# Patient Record
Sex: Female | Born: 1960 | Race: White | Hispanic: No | Marital: Married | Smoking: Never smoker
Health system: Southern US, Community
[De-identification: ages and names within clinical notes are randomized; demographics above are authoritative.]

## PROBLEM LIST (undated history)

## (undated) ENCOUNTER — Inpatient Hospital Stay: Admission: EM | Payer: Self-pay | Source: Home / Self Care

## (undated) DIAGNOSIS — M545 Low back pain: Secondary | ICD-10-CM

## (undated) DIAGNOSIS — D219 Benign neoplasm of connective and other soft tissue, unspecified: Secondary | ICD-10-CM

## (undated) DIAGNOSIS — G8929 Other chronic pain: Secondary | ICD-10-CM

## (undated) DIAGNOSIS — I10 Essential (primary) hypertension: Secondary | ICD-10-CM

## (undated) DIAGNOSIS — F419 Anxiety disorder, unspecified: Secondary | ICD-10-CM

## (undated) DIAGNOSIS — N390 Urinary tract infection, site not specified: Secondary | ICD-10-CM

## (undated) DIAGNOSIS — Z923 Personal history of irradiation: Secondary | ICD-10-CM

## (undated) DIAGNOSIS — T7840XA Allergy, unspecified, initial encounter: Secondary | ICD-10-CM

## (undated) DIAGNOSIS — M1990 Unspecified osteoarthritis, unspecified site: Secondary | ICD-10-CM

## (undated) DIAGNOSIS — N939 Abnormal uterine and vaginal bleeding, unspecified: Principal | ICD-10-CM

## (undated) HISTORY — DX: Urinary tract infection, site not specified: N39.0

## (undated) HISTORY — DX: Low back pain: M54.5

## (undated) HISTORY — PX: KNEE SURGERY: SHX244

## (undated) HISTORY — DX: Allergy, unspecified, initial encounter: T78.40XA

## (undated) HISTORY — DX: Other chronic pain: G89.29

## (undated) HISTORY — DX: Benign neoplasm of connective and other soft tissue, unspecified: D21.9

## (undated) HISTORY — DX: Abnormal uterine and vaginal bleeding, unspecified: N93.9

## (undated) HISTORY — DX: Anxiety disorder, unspecified: F41.9

## (undated) HISTORY — DX: Essential (primary) hypertension: I10

## (undated) HISTORY — PX: WISDOM TOOTH EXTRACTION: SHX21

---

## 1998-10-17 ENCOUNTER — Other Ambulatory Visit: Admission: RE | Admit: 1998-10-17 | Discharge: 1998-10-17 | Payer: Self-pay | Admitting: Family Medicine

## 2001-04-22 ENCOUNTER — Encounter: Payer: Self-pay | Admitting: Family Medicine

## 2001-04-22 ENCOUNTER — Ambulatory Visit (HOSPITAL_COMMUNITY): Admission: RE | Admit: 2001-04-22 | Discharge: 2001-04-22 | Payer: Self-pay | Admitting: Family Medicine

## 2002-04-23 ENCOUNTER — Ambulatory Visit (HOSPITAL_COMMUNITY): Admission: RE | Admit: 2002-04-23 | Discharge: 2002-04-23 | Payer: Self-pay | Admitting: Family Medicine

## 2002-04-23 ENCOUNTER — Encounter: Payer: Self-pay | Admitting: Family Medicine

## 2003-05-18 ENCOUNTER — Ambulatory Visit (HOSPITAL_COMMUNITY): Admission: RE | Admit: 2003-05-18 | Discharge: 2003-05-18 | Payer: Self-pay | Admitting: Family Medicine

## 2004-07-31 ENCOUNTER — Ambulatory Visit (HOSPITAL_COMMUNITY): Admission: RE | Admit: 2004-07-31 | Discharge: 2004-07-31 | Payer: Self-pay | Admitting: Nurse Practitioner

## 2005-08-06 ENCOUNTER — Ambulatory Visit (HOSPITAL_COMMUNITY): Admission: RE | Admit: 2005-08-06 | Discharge: 2005-08-06 | Payer: Self-pay | Admitting: Family Medicine

## 2006-08-07 ENCOUNTER — Ambulatory Visit (HOSPITAL_COMMUNITY): Admission: RE | Admit: 2006-08-07 | Discharge: 2006-08-07 | Payer: Self-pay | Admitting: Family Medicine

## 2007-09-04 ENCOUNTER — Ambulatory Visit (HOSPITAL_COMMUNITY): Admission: RE | Admit: 2007-09-04 | Discharge: 2007-09-04 | Payer: Self-pay | Admitting: Family Medicine

## 2008-09-05 ENCOUNTER — Ambulatory Visit (HOSPITAL_COMMUNITY): Admission: RE | Admit: 2008-09-05 | Discharge: 2008-09-05 | Payer: Self-pay | Admitting: Physician Assistant

## 2009-09-27 ENCOUNTER — Ambulatory Visit (HOSPITAL_COMMUNITY): Admission: RE | Admit: 2009-09-27 | Discharge: 2009-09-27 | Payer: Self-pay | Admitting: Physician Assistant

## 2010-07-29 ENCOUNTER — Encounter: Payer: Self-pay | Admitting: Family Medicine

## 2010-10-15 ENCOUNTER — Other Ambulatory Visit: Payer: Self-pay | Admitting: Physician Assistant

## 2010-10-15 DIAGNOSIS — Z1231 Encounter for screening mammogram for malignant neoplasm of breast: Secondary | ICD-10-CM

## 2010-10-17 ENCOUNTER — Ambulatory Visit (HOSPITAL_COMMUNITY)
Admission: RE | Admit: 2010-10-17 | Discharge: 2010-10-17 | Disposition: A | Discharge status: Home / Self Care | Payer: 59 | Source: Ambulatory Visit | Attending: Family Medicine | Admitting: Family Medicine

## 2010-10-17 DIAGNOSIS — Z1231 Encounter for screening mammogram for malignant neoplasm of breast: Secondary | ICD-10-CM | POA: Insufficient documentation

## 2010-10-17 LAB — HM MAMMOGRAPHY: HM Mammogram: NORMAL

## 2011-01-11 ENCOUNTER — Encounter: Payer: Self-pay | Admitting: Family Medicine

## 2011-01-11 DIAGNOSIS — I10 Essential (primary) hypertension: Secondary | ICD-10-CM | POA: Insufficient documentation

## 2011-01-11 DIAGNOSIS — N390 Urinary tract infection, site not specified: Secondary | ICD-10-CM | POA: Insufficient documentation

## 2011-01-11 DIAGNOSIS — F419 Anxiety disorder, unspecified: Secondary | ICD-10-CM | POA: Insufficient documentation

## 2011-11-19 ENCOUNTER — Other Ambulatory Visit: Payer: Self-pay | Admitting: Physician Assistant

## 2011-11-19 DIAGNOSIS — Z1231 Encounter for screening mammogram for malignant neoplasm of breast: Secondary | ICD-10-CM

## 2011-12-16 ENCOUNTER — Ambulatory Visit (HOSPITAL_COMMUNITY): Payer: 59 | Attending: Physician Assistant

## 2012-01-18 ENCOUNTER — Encounter (HOSPITAL_COMMUNITY): Payer: Self-pay | Admitting: *Deleted

## 2012-01-18 ENCOUNTER — Emergency Department (HOSPITAL_COMMUNITY)
Admission: EM | Admit: 2012-01-18 | Discharge: 2012-01-18 | Disposition: A | Discharge status: Home / Self Care | Payer: BC Managed Care – PPO | Attending: Emergency Medicine | Admitting: Emergency Medicine

## 2012-01-18 DIAGNOSIS — Z7982 Long term (current) use of aspirin: Secondary | ICD-10-CM | POA: Insufficient documentation

## 2012-01-18 DIAGNOSIS — M62838 Other muscle spasm: Secondary | ICD-10-CM | POA: Insufficient documentation

## 2012-01-18 DIAGNOSIS — Z79899 Other long term (current) drug therapy: Secondary | ICD-10-CM | POA: Insufficient documentation

## 2012-01-18 DIAGNOSIS — I10 Essential (primary) hypertension: Secondary | ICD-10-CM | POA: Insufficient documentation

## 2012-01-18 DIAGNOSIS — R42 Dizziness and giddiness: Secondary | ICD-10-CM | POA: Insufficient documentation

## 2012-01-18 LAB — CBC WITH DIFFERENTIAL/PLATELET
Basophils Relative: 0 % (ref 0–1)
Eosinophils Relative: 0 % (ref 0–5)

## 2012-01-18 LAB — COMPREHENSIVE METABOLIC PANEL
ALT: 21 U/L (ref 0–35)
Albumin: 4.6 g/dL (ref 3.5–5.2)
Alkaline Phosphatase: 36 U/L — ABNORMAL LOW (ref 39–117)
BUN: 9 mg/dL (ref 6–23)
Calcium: 10 mg/dL (ref 8.4–10.5)
Chloride: 92 mEq/L — ABNORMAL LOW (ref 96–112)
Creatinine, Ser: 0.71 mg/dL (ref 0.50–1.10)
GFR calc Af Amer: >90 mL/min (ref >90)
Glucose, Bld: 122 mg/dL — ABNORMAL HIGH (ref 70–99)
Total Bilirubin: 0.3 mg/dL (ref 0.3–1.2)

## 2012-01-18 MED ORDER — LORAZEPAM 1 MG PO TABS: ORAL_TABLET | ORAL | Status: DC

## 2012-01-18 NOTE — ED Notes (Signed)
Pt reports having "jerking motions" starting last night that were brief, but frequent.  Pt reports she did not take her blood pressure medications or citalopram- pt takes for anxiety.  Pt reports she forgot to take them last night.  Pt reports feeling dizzy, but no other issues.

## 2012-01-18 NOTE — ED Provider Notes (Signed)
History     CSN: [id number]  Arrival date & time 01/18/12  4098   First MD Initiated Contact with Patient 01/18/12 0913      Chief Complaint  Patient presents with  . Dizziness  . Hypertension    (Consider location/radiation/quality/duration/timing/severity/associated sxs/prior treatment) Patient is a 51 y.o. female presenting with hypertension. The history is provided by the patient (pt complains of muscle spasms yesterday after she took her husbans medicine). No language interpreter was used.  Hypertension This is a new problem. The current episode started 6 to 12 hours ago. The problem occurs daily. The problem has not changed since onset.Pertinent negatives include no chest pain, no abdominal pain and no headaches. Nothing aggravates the symptoms. Nothing relieves the symptoms. She has tried nothing for the symptoms. The treatment provided moderate relief.    Past Medical History  Diagnosis Date  . Hypertension   . Anxiety   . Frequent UTI     Past Surgical History  Procedure Date  . Knee surgery     left  . Wisdom tooth extraction     No family history on file.  History  Substance Use Topics  . Smoking status: Former Games developer  . Smokeless tobacco: Not on file  . Alcohol Use: 3.6 oz/week    6 Cans of beer per week    OB History    Grav Para Term Preterm Abortions TAB SAB Ect Mult Living                  Review of Systems  Constitutional: Negative for fatigue.  HENT: Negative for congestion, sinus pressure and ear discharge.   Eyes: Negative for discharge.  Respiratory: Negative for cough.   Cardiovascular: Negative for chest pain.  Gastrointestinal: Negative for abdominal pain and diarrhea.  Genitourinary: Negative for frequency and hematuria.  Musculoskeletal: Negative for back pain.  Skin: Negative for rash.  Neurological: Negative for seizures and headaches.  Hematological: Negative.   Psychiatric/Behavioral: Negative for hallucinations.     Allergies  Norvasc  Home Medications   Current Outpatient Rx  Name Route Sig Dispense Refill  . ASPIRIN 81 MG PO TABS Oral Take 81 mg by mouth daily.      . ATENOLOL 25 MG PO TABS Oral Take 25 mg by mouth daily.      Marland Kitchen CALCIUM CARBONATE-VITAMIN D 250-125 MG-UNIT PO TABS Oral Take 1 tablet by mouth daily.      Marland Kitchen CETIRIZINE HCL 10 MG PO TABS Oral Take 10 mg by mouth daily.    Marland Kitchen CITALOPRAM HYDROBROMIDE 40 MG PO TABS Oral Take 40 mg by mouth daily.      Marland Kitchen CLONAZEPAM 0.5 MG PO TABS Oral Take 0.5 mg by mouth 2 (two) times daily as needed.      . OMEGA-3 FATTY ACIDS 1000 MG PO CAPS Oral Take 2 g by mouth 2 (two) times daily.      Marland Kitchen HYDROCHLOROTHIAZIDE 25 MG PO TABS Oral Take 25 mg by mouth daily.      Marland Kitchen LOSARTAN POTASSIUM 50 MG PO TABS Oral Take 50 mg by mouth daily.      . LOW-OGESTREL PO Oral Take by mouth.      . TRAMADOL HCL 50 MG PO TABS Oral Take 50 mg by mouth every 6 (six) hours as needed. For pain.    Marland Kitchen LORAZEPAM 1 MG PO TABS  Take one pill 3 times a day for muscle spasm 15 tablet 0    BP 181/79  Pulse 75  Temp 98.5 F (36.9 C) (Oral)  Resp 16  SpO2 100%  Physical Exam  Constitutional: She is oriented to person, place, and time. She appears well-developed.  HENT:  Head: Normocephalic and atraumatic.  Eyes: Conjunctivae and EOM are normal. No scleral icterus.  Neck: Neck supple. No thyromegaly present.  Cardiovascular: Normal rate and regular rhythm.  Exam reveals no gallop and no friction rub.   No murmur heard. Pulmonary/Chest: No stridor. She has no wheezes. She has no rales. She exhibits no tenderness.  Abdominal: She exhibits no distension. There is no tenderness. There is no rebound.  Musculoskeletal: Normal range of motion. She exhibits no edema.  Lymphadenopathy:    She has no cervical adenopathy.  Neurological: She is oriented to person, place, and time. Coordination normal.  Skin: No rash noted. No erythema.  Psychiatric: She has a normal mood and affect.  Her behavior is normal.    ED Course  Procedures (including critical care time)  Labs Reviewed  CBC WITH DIFFERENTIAL - Abnormal; Notable for the following:    MCH 34.2 (*)     All other components within normal limits  COMPREHENSIVE METABOLIC PANEL - Abnormal; Notable for the following:    Sodium 129 (*)     Chloride 92 (*)     Glucose, Bld 122 (*)     Alkaline Phosphatase 36 (*)     All other components within normal limits   No results found.   1. Muscle spasms of lower extremity       MDM          Benny Lennert, MD 01/18/12 1036

## 2012-01-18 NOTE — ED Notes (Signed)
Pt states she took her BP medication and citalopram this AM around 6.

## 2012-01-18 NOTE — ED Notes (Addendum)
Pt resting at present ?

## 2012-02-13 ENCOUNTER — Ambulatory Visit (HOSPITAL_COMMUNITY)
Admission: RE | Admit: 2012-02-13 | Discharge: 2012-02-13 | Disposition: A | Discharge status: Home / Self Care | Payer: BC Managed Care – PPO | Source: Ambulatory Visit | Attending: Physician Assistant | Admitting: Physician Assistant

## 2012-02-13 DIAGNOSIS — Z1231 Encounter for screening mammogram for malignant neoplasm of breast: Secondary | ICD-10-CM

## 2012-07-08 HISTORY — PX: KNEE SURGERY: SHX244

## 2012-09-24 ENCOUNTER — Telehealth: Payer: Self-pay | Admitting: Physician Assistant

## 2012-09-24 NOTE — Telephone Encounter (Signed)
Need approval for controlled medication. 

## 2012-09-24 NOTE — Telephone Encounter (Signed)
Approved. 90/ one refill.

## 2012-11-17 ENCOUNTER — Telehealth: Payer: Self-pay | Admitting: Physician Assistant

## 2012-11-17 NOTE — Telephone Encounter (Signed)
Alprazolam 0.5mg  take one tablet by mouth TID as needed last refill 10/17/12

## 2012-11-17 NOTE — Telephone Encounter (Signed)
Refilled 3/20/ #90 + 1 add refill.  Not due until 5/20.  Refilled denied at this time.

## 2012-11-23 ENCOUNTER — Telehealth: Payer: Self-pay | Admitting: Physician Assistant

## 2012-11-23 MED ORDER — ALPRAZOLAM 0.5 MG PO TABS: 0.5000 mg | ORAL_TABLET | Freq: Three times a day (TID) | ORAL | Status: DC | PRN

## 2012-11-23 NOTE — Telephone Encounter (Signed)
Refill appropriate.  #90 + 0 called to pharmacy

## 2012-11-23 NOTE — Telephone Encounter (Signed)
Approved.  

## 2012-12-02 ENCOUNTER — Telehealth: Payer: Self-pay | Admitting: Family Medicine

## 2012-12-02 MED ORDER — ATENOLOL 25 MG PO TABS: 25.0000 mg | ORAL_TABLET | Freq: Every day | ORAL | Status: DC

## 2012-12-02 NOTE — Telephone Encounter (Signed)
Rx Refilled  

## 2012-12-23 ENCOUNTER — Telehealth: Payer: Self-pay | Admitting: Family Medicine

## 2012-12-23 NOTE — Telephone Encounter (Signed)
Ok to refill 

## 2012-12-23 NOTE — Telephone Encounter (Signed)
?   OK to Refill  

## 2012-12-24 MED ORDER — ALPRAZOLAM 0.5 MG PO TABS: 0.5000 mg | ORAL_TABLET | Freq: Three times a day (TID) | ORAL | Status: DC | PRN

## 2012-12-24 NOTE — Telephone Encounter (Signed)
Rx Refilled  

## 2013-01-11 ENCOUNTER — Other Ambulatory Visit: Payer: Self-pay | Admitting: Family Medicine

## 2013-01-18 ENCOUNTER — Other Ambulatory Visit: Payer: Self-pay | Admitting: Physician Assistant

## 2013-01-21 ENCOUNTER — Other Ambulatory Visit: Payer: Self-pay | Admitting: Family Medicine

## 2013-01-21 ENCOUNTER — Other Ambulatory Visit: Payer: Self-pay | Admitting: Physician Assistant

## 2013-01-22 ENCOUNTER — Other Ambulatory Visit: Payer: Self-pay | Admitting: Family Medicine

## 2013-01-22 ENCOUNTER — Telehealth: Payer: Self-pay | Admitting: Family Medicine

## 2013-01-22 ENCOUNTER — Encounter: Payer: Self-pay | Admitting: Family Medicine

## 2013-01-22 MED ORDER — ALPRAZOLAM 0.5 MG PO TABS: ORAL_TABLET | ORAL | Status: DC

## 2013-01-22 NOTE — Telephone Encounter (Signed)
Rx Refilled  

## 2013-01-22 NOTE — Telephone Encounter (Signed)
Pt made appt for 02/01/13.  One month refill called.

## 2013-01-22 NOTE — Telephone Encounter (Signed)
Refill denied.  Based on last office visit is suppose to cutting back on use and continues with same TID use.  Is due for office visit.  Letter sent to make appt and refill denied.

## 2013-01-22 NOTE — Telephone Encounter (Signed)
?   OK to Refill  

## 2013-01-22 NOTE — Telephone Encounter (Signed)
Ok to refill 

## 2013-01-22 NOTE — Telephone Encounter (Signed)
Just refilled 01/18/13 #30 + 1  Refill denied.  Pt NTBS  letter sent to make appt.

## 2013-02-01 ENCOUNTER — Encounter: Payer: Self-pay | Admitting: Physician Assistant

## 2013-02-01 ENCOUNTER — Ambulatory Visit (INDEPENDENT_AMBULATORY_CARE_PROVIDER_SITE_OTHER): Payer: BC Managed Care – PPO | Admitting: Physician Assistant

## 2013-02-01 VITALS — BP 114/66 | HR 64 | Temp 98.2°F | Resp 18 | Ht 63.5 in | Wt 157.0 lb

## 2013-02-01 DIAGNOSIS — I10 Essential (primary) hypertension: Secondary | ICD-10-CM

## 2013-02-01 DIAGNOSIS — M5432 Sciatica, left side: Secondary | ICD-10-CM

## 2013-02-01 DIAGNOSIS — M5430 Sciatica, unspecified side: Secondary | ICD-10-CM

## 2013-02-01 DIAGNOSIS — F411 Generalized anxiety disorder: Secondary | ICD-10-CM

## 2013-02-01 DIAGNOSIS — F419 Anxiety disorder, unspecified: Secondary | ICD-10-CM

## 2013-02-01 LAB — COMPLETE METABOLIC PANEL WITH GFR
ALT: 22 U/L (ref 0–35)
AST: 16 U/L (ref 0–37)
Albumin: 4.5 g/dL (ref 3.5–5.2)
BUN: 26 mg/dL — ABNORMAL HIGH (ref 6–23)
Chloride: 102 mEq/L (ref 96–112)
GFR, Est African American: 63 mL/min
Glucose, Bld: 95 mg/dL (ref 70–99)
Potassium: 4.5 mEq/L (ref 3.5–5.3)
Sodium: 136 mEq/L (ref 135–145)
Total Protein: 6.8 g/dL (ref 6.0–8.3)

## 2013-02-01 MED ORDER — TRAMADOL HCL 50 MG PO TABS: 50.0000 mg | ORAL_TABLET | Freq: Four times a day (QID) | ORAL | Status: DC | PRN

## 2013-02-01 NOTE — Progress Notes (Signed)
Patient ID: Erika Harvey MRN: [id number], DOB: Jun 08, 1961, 52 y.o. Date of Encounter: @DATE @  Chief Complaint:  Chief Complaint  Patient presents with  . 6 mth check up med refills    also c/o low back pain s/p knee surgery    HPI: 52 y.o. year old white female  presents for routine f/u OV.   Also, has c/o pain that starts in left thigh and goes down lateral aspect of left leg. She had left knee surgery in January. After that is when this pain started. Ortho treated her with prednisone. It got better but then returned. Now it is constant. Pain all the time. Very uncomfortable at work. She is going to schedule f/u ov with ortho but requests that I refill tramadol once for her to use shile she waits to get in with ortho.   At OV 11/13 she wanted to try to decrease Celexa dose. Says "that only lasted 3 days"-Felt terrible-went back to 40 mg. Mood is stable with this and the xanax.   Taking BP meds as directed. No adv effects.   Past Medical History  Diagnosis Date  . Hypertension   . Anxiety   . Frequent UTI      Home Meds: See attached medication section for current medication list. Any medications entered into computer today will not appear on this note's list. The medications listed below were entered prior to today. Current Outpatient Prescriptions on File Prior to Visit  Medication Sig Dispense Refill  . ALPRAZolam (XANAX) 0.5 MG tablet TAKE 1 TABLET BY MOUTH 3 TIMES A DAY AS NEEDED  90 tablet  0  . aspirin 81 MG tablet Take 81 mg by mouth daily.        Marland Kitchen atenolol (TENORMIN) 25 MG tablet Take 1 tablet (25 mg total) by mouth daily.  30 tablet  5  . calcium-vitamin D (OSCAL WITH D) 250-125 MG-UNIT per tablet Take 1 tablet by mouth daily.        . cetirizine (ZYRTEC) 10 MG tablet Take 10 mg by mouth daily.      . citalopram (CELEXA) 40 MG tablet TAKE 1 TABLET DAILY  30 tablet  1  . fish oil-omega-3 fatty acids 1000 MG capsule Take 2 g by mouth 2 (two) times daily.        .  hydrochlorothiazide 25 MG tablet Take 25 mg by mouth daily.        Marland Kitchen losartan (COZAAR) 50 MG tablet TAKE 1 TABLET BY MOUTH EVERY DAY  30 tablet  4  . Norgestrel-Ethinyl Estradiol (LOW-OGESTREL PO) Take by mouth.        . clonazePAM (KLONOPIN) 0.5 MG tablet Take 0.5 mg by mouth 2 (two) times daily as needed.        Marland Kitchen LORazepam (ATIVAN) 1 MG tablet Take one pill 3 times a day for muscle spasm  15 tablet  0   No current facility-administered medications on file prior to visit.    Allergies:  Allergies  Allergen Reactions  . Norvasc (Amlodipine Besylate) Swelling and Rash    History   Social History  . Marital Status: Married    Spouse Name: N/A    Number of Children: N/A  . Years of Education: N/A   Occupational History  . Not on file.   Social History Main Topics  . Smoking status: Former Games developer  . Smokeless tobacco: Not on file  . Alcohol Use: 3.6 oz/week    6 Cans of beer per  week  . Drug Use: No  . Sexually Active:    Other Topics Concern  . Not on file   Social History Narrative  . No narrative on file    No family history on file.   Review of Systems:  See HPI for pertinent ROS. All other ROS negative.    Physical Exam: Blood pressure 114/66, pulse 64, temperature 98.2 F (36.8 C), temperature source Oral, resp. rate 18, height 5' 3.5" (1.613 m), weight 157 lb (71.215 kg)., Body mass index is 27.37 kg/(m^2). General: WNWD WF. Appears in no acute distress. Neck: Supple. No thyromegaly. No lymphadenopathy. No carotid bruits. Lungs: Clear bilaterally to auscultation without wheezes, rales, or rhonchi. Breathing is unlabored. Heart: RRR with S1 S2. No murmurs, rubs, or gallops. Abdomen: Soft, non-tender, non-distended with normoactive bowel sounds. No hepatomegaly. No rebound/guarding. No obvious abdominal masses. Musculoskeletal:  Strength and tone normal for age. Extremities/Skin: Warm and dry. No clubbing or cyanosis. No edema. No rashes or suspicious  lesions. Neuro: Alert and oriented X 3. Moves all extremities spontaneously. Gait is normal. CNII-XII grossly in tact. Psych:  Responds to questions appropriately with a normal affect.     ASSESSMENT AND PLAN:  52 y.o. year old female with  52 y.o. year old female with current dose of Celexa and Xanax. See prior notes regaarding the fact that she uses xanax throughtout her day "in order to calm down enough to focus" - COMPLETE METABOLIC PANEL WITH GFR  2. Hypertension At goal. Cont current meds.  - COMPLETE METABOLIC PANEL WITH GFR  3. Sciatica neuralgia, left F/U with ortho. - traMADol (ULTRAM) 50 MG tablet; Take 1 tablet (50 mg total) by mouth every 6 (six) hours as needed. For pain.  Dispense: 240 tablet; Refill: 1 - COMPLETE METABOLIC PANEL WITH GFR  4. Weight Gain: Secondary to knee surgery and sciatica. Wt now 157.          05/2012: 151         02/2012: 136. She says she was doing treadmill 5 days/week for one mile prior to above.  Will monitor at f/u. She will improve diet. Will restart exercise after f/u with ortho and once pain resolves.   For preventive care see CPE 05/25/2012  Signed, [facility] [place], [place], [facility] 02/01/2013 8:14 PM

## 2013-02-02 ENCOUNTER — Encounter: Payer: Self-pay | Admitting: Family Medicine

## 2013-02-21 ENCOUNTER — Other Ambulatory Visit: Payer: Self-pay | Admitting: Family Medicine

## 2013-02-22 NOTE — Telephone Encounter (Signed)
ok 

## 2013-02-22 NOTE — Telephone Encounter (Signed)
?   OK to Refill  

## 2013-03-23 ENCOUNTER — Other Ambulatory Visit: Payer: Self-pay | Admitting: Family Medicine

## 2013-03-24 ENCOUNTER — Other Ambulatory Visit: Payer: Self-pay | Admitting: Physician Assistant

## 2013-03-24 NOTE — Telephone Encounter (Signed)
Ok to refill 

## 2013-03-25 NOTE — Telephone Encounter (Signed)
ok 

## 2013-03-25 NOTE — Telephone Encounter (Signed)
Meds refilled.

## 2013-03-27 ENCOUNTER — Other Ambulatory Visit: Payer: Self-pay | Admitting: Physician Assistant

## 2013-03-29 NOTE — Telephone Encounter (Signed)
I reviewed last office note. She was going to followup with orthopedic regarding her sciatica. She wanted to use tramadol while she waited for that appointment. Tell her we will he needs to decrease the amount. I will give her 180 per month. Followup with Ortho as planned. May dispense 180 with 0 refill.

## 2013-03-29 NOTE — Telephone Encounter (Signed)
#  180 called in. Left patient mess to call back to ask about ortho appt and tell about quantity adjustment to pain med

## 2013-03-29 NOTE — Telephone Encounter (Signed)
Ok to refill 

## 2013-04-05 ENCOUNTER — Other Ambulatory Visit: Payer: Self-pay | Admitting: Physician Assistant

## 2013-04-05 NOTE — Telephone Encounter (Signed)
OK refill?? 

## 2013-04-06 NOTE — Telephone Encounter (Signed)
May refill with prn refills

## 2013-04-06 NOTE — Telephone Encounter (Signed)
rx sent

## 2013-04-12 ENCOUNTER — Other Ambulatory Visit: Payer: Self-pay | Admitting: Physician Assistant

## 2013-04-12 NOTE — Telephone Encounter (Signed)
This was just refill 9/29

## 2013-04-22 ENCOUNTER — Other Ambulatory Visit: Payer: Self-pay | Admitting: Family Medicine

## 2013-04-22 NOTE — Telephone Encounter (Signed)
Last OV 7/28  Last RF 9/16 #90  OK refill?

## 2013-04-22 NOTE — Telephone Encounter (Signed)
Approved for #90+ one additional refill. 

## 2013-04-23 ENCOUNTER — Encounter: Payer: Self-pay | Admitting: Family Medicine

## 2013-04-23 ENCOUNTER — Ambulatory Visit (INDEPENDENT_AMBULATORY_CARE_PROVIDER_SITE_OTHER): Payer: BC Managed Care – PPO | Admitting: Family Medicine

## 2013-04-23 VITALS — BP 160/96 | HR 76 | Temp 98.2°F | Resp 18 | Wt 150.0 lb

## 2013-04-23 DIAGNOSIS — R112 Nausea with vomiting, unspecified: Secondary | ICD-10-CM

## 2013-04-23 LAB — COMPLETE METABOLIC PANEL WITH GFR
ALT: 31 U/L (ref 0–35)
AST: 23 U/L (ref 0–37)
Alkaline Phosphatase: 37 U/L — ABNORMAL LOW (ref 39–117)
Creat: 0.83 mg/dL (ref 0.50–1.10)
GFR, Est African American: >89 mL/min
Total Bilirubin: 0.4 mg/dL (ref 0.3–1.2)

## 2013-04-23 LAB — CBC WITH DIFFERENTIAL/PLATELET
Basophils Absolute: 0 10*3/uL (ref 0.0–0.1)
Basophils Relative: 0 % (ref 0–1)
Eosinophils Absolute: 0 10*3/uL (ref 0.0–0.7)
Eosinophils Relative: 0 % (ref 0–5)
MCH: 33.7 pg (ref 26.0–34.0)
MCV: 95 fL (ref 78.0–100.0)
Platelets: 460 10*3/uL — ABNORMAL HIGH (ref 150–400)
RDW: 12.3 % (ref 11.5–15.5)
WBC: 11.3 10*3/uL — ABNORMAL HIGH (ref 4.0–10.5)

## 2013-04-23 NOTE — Progress Notes (Signed)
Subjective:    Patient ID: Erika Harvey, female    DOB: 07-11-1960, 52 y.o.   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 Since beginning the prednisone she has had intractable nausea. She has not vomited. However she feels extremely nauseated soon as she eats. She also had some mild diarrhea early in the week but has not been every day. She denies any fevers or chills. She denies any sinus pressure or pain. She denies any otalgia. She denies any cough shortness of breath or chest pain. She denies any vaginal discharge. She denies any vaginal bleeding. She denies any dysuria or hematuria. She states is no chance she could be pregnant. Past Medical History  Diagnosis Date  . Hypertension   . Anxiety   . Frequent UTI    Current Outpatient Prescriptions on File Prior to Visit  Medication Sig Dispense Refill  . ALPRAZolam (XANAX) 0.5 MG tablet TAKE 1 TABLET BY MOUTH 3 TIMES DAILY AS NEEDED  90 tablet  0  . aspirin 81 MG tablet Take 81 mg by mouth daily.        Marland Kitchen atenolol (TENORMIN) 25 MG tablet Take 1 tablet (25 mg total) by mouth daily.  30 tablet  5  . calcium-vitamin D (OSCAL WITH D) 250-125 MG-UNIT per tablet Take 1 tablet by mouth daily.        . cetirizine (ZYRTEC) 10 MG tablet Take 10 mg by mouth daily.      . citalopram (CELEXA) 40 MG tablet TAKE 1 TABLET EVERY DAY  30 tablet  1  . clonazePAM (KLONOPIN) 0.5 MG tablet Take 0.5 mg by mouth 2 (two) times daily as needed.        . Harvey oil-omega-3 fatty acids 1000 MG capsule Take 2 g by mouth 2 (two) times daily.        . hydrochlorothiazide 25 MG tablet Take 25 mg by mouth daily.        Marland Kitchen losartan (COZAAR) 50 MG tablet TAKE 1 TABLET BY MOUTH EVERY DAY  30 tablet  4  . Norgestrel-Ethinyl Estradiol (LOW-OGESTREL PO) Take by mouth.        . sulfamethoxazole-trimethoprim (BACTRIM DS) 800-160 MG per tablet TAKE 1 TABLET BY MOUTH AFTER INTERCOURSE AS NEEDED  30 tablet  prn   . traMADol (ULTRAM) 50 MG tablet TAKE 1 TABLET BY MOUTH EVERY 6 HOURS AS NEEDED FOR PAIN  180 tablet  0  . LORazepam (ATIVAN) 1 MG tablet Take one pill 3 times a day for muscle spasm  15 tablet  0  . meloxicam (MOBIC) 15 MG tablet Take 1 tablet by mouth daily.       No current facility-administered medications on file prior to visit.   Allergies  Allergen Reactions  . Norvasc [Amlodipine Besylate] Swelling and Rash   History   Social History  . Marital Status: Married    Spouse Name: N/A    Number of Children: N/A  . Years of Education: N/A   Occupational History  . Not on file.   Social History Main Topics  . Smoking status: Former Games developer  . Smokeless tobacco: Not on file  . Alcohol Use: 3.6 oz/week    6 Cans of beer per week  . Drug Use: No  . Sexual Activity:    Other Topics Concern  . Not on file   Social History Narrative  . No narrative on file  Review of Systems  All other systems reviewed and are negative.       Objective:   Physical Exam  Vitals reviewed. Constitutional: She appears well-developed and well-nourished. No distress.  HENT:  Head: Normocephalic and atraumatic.  Right Ear: External ear normal.  Left Ear: External ear normal.  Nose: Nose normal.  Mouth/Throat: Oropharynx is clear and moist. No oropharyngeal exudate.  Eyes: Conjunctivae are normal. Pupils are equal, round, and reactive to light. Right eye exhibits no discharge. Left eye exhibits no discharge. No scleral icterus.  Neck: Normal range of motion. Neck supple. No JVD present. No thyromegaly present.  Cardiovascular: Normal rate, regular rhythm, normal heart sounds and intact distal pulses.   No murmur heard. Pulmonary/Chest: Effort normal and breath sounds normal. No respiratory distress. She has no wheezes. She has no rales. She exhibits no tenderness.  Abdominal: Soft. Bowel sounds are normal. She exhibits no distension. There is no tenderness. There is no rebound and  no guarding.  Lymphadenopathy:    She has no cervical adenopathy.  Skin: She is not diaphoretic.          Assessment & Plan:  1. Nausea with vomiting I suspect this is nausea due to the prednisone. I recommended she discontinue prednisone. Begin Zofran 4 mg Q6 hours as needed for nausea. Recommended a bland diet and push fluids. Anticipate self limited resolution of the weekend. If symptoms worsen she is to seek a doctor immediately. I will check a CBC a CMP and lipase to be thorough. I anticipate them to be normal. I anticipate the patient's blood pressure improved on the prednisone and is usually well controlled. - CBC with Differential - COMPLETE METABOLIC PANEL WITH GFR - Lipase

## 2013-04-23 NOTE — Telephone Encounter (Signed)
rx called in

## 2013-05-13 ENCOUNTER — Other Ambulatory Visit: Payer: Self-pay

## 2013-05-15 ENCOUNTER — Other Ambulatory Visit: Payer: Self-pay | Admitting: Physician Assistant

## 2013-05-15 ENCOUNTER — Other Ambulatory Visit: Payer: Self-pay | Admitting: Family Medicine

## 2013-05-21 ENCOUNTER — Other Ambulatory Visit: Payer: Self-pay | Admitting: Physician Assistant

## 2013-05-24 NOTE — Telephone Encounter (Signed)
?   OK to Refill  

## 2013-05-24 NOTE — Telephone Encounter (Signed)
Approved for #30+3 additional refills 

## 2013-05-28 ENCOUNTER — Other Ambulatory Visit: Payer: Self-pay | Admitting: Family Medicine

## 2013-06-09 ENCOUNTER — Other Ambulatory Visit: Payer: Self-pay | Admitting: Physician Assistant

## 2013-06-09 ENCOUNTER — Encounter: Payer: Self-pay | Admitting: Family Medicine

## 2013-06-09 NOTE — Telephone Encounter (Signed)
Pt due for Pap Smear.  Reminder letter sent.  Has routine visit at end January.  Refills given thru that

## 2013-06-12 ENCOUNTER — Other Ambulatory Visit: Payer: Self-pay | Admitting: Physician Assistant

## 2013-06-14 NOTE — Telephone Encounter (Signed)
Medication refilled per protocol. 

## 2013-06-18 ENCOUNTER — Other Ambulatory Visit: Payer: Self-pay | Admitting: Physician Assistant

## 2013-06-18 NOTE — Telephone Encounter (Signed)
Ok to refill 

## 2013-06-21 ENCOUNTER — Encounter: Payer: Self-pay | Admitting: Family Medicine

## 2013-06-21 NOTE — Telephone Encounter (Signed)
Approved to refilll now +2 additional refills.--each for # 90 Call in one prescription for now for #90. Call in one prescription to fill on or after 07/22/2013 for #90 Call in one prescription to fill on or after 08/22/2013 for #90

## 2013-06-21 NOTE — Telephone Encounter (Signed)
This encounter was created in error - please disregard.

## 2013-06-21 NOTE — Telephone Encounter (Signed)
Pt is calling today because her pharmacy was suppose to fax over a request for her xanax and she is wanting to check on status of that  Call back number is [phone]

## 2013-07-25 ENCOUNTER — Other Ambulatory Visit: Payer: Self-pay | Admitting: Physician Assistant

## 2013-07-26 NOTE — Telephone Encounter (Signed)
6 mth visit in two weeks  One refill sent

## 2013-07-28 ENCOUNTER — Other Ambulatory Visit: Payer: Self-pay | Admitting: Physician Assistant

## 2013-07-28 NOTE — Telephone Encounter (Signed)
Medication refilled per protocol. 

## 2013-08-04 ENCOUNTER — Ambulatory Visit (INDEPENDENT_AMBULATORY_CARE_PROVIDER_SITE_OTHER): Payer: BC Managed Care – PPO | Admitting: Physician Assistant

## 2013-08-04 ENCOUNTER — Encounter: Payer: Self-pay | Admitting: Physician Assistant

## 2013-08-04 VITALS — BP 128/80 | HR 68 | Temp 97.6°F | Resp 18 | Ht 64.0 in | Wt 149.0 lb

## 2013-08-04 DIAGNOSIS — F411 Generalized anxiety disorder: Secondary | ICD-10-CM

## 2013-08-04 DIAGNOSIS — I10 Essential (primary) hypertension: Secondary | ICD-10-CM

## 2013-08-04 DIAGNOSIS — F419 Anxiety disorder, unspecified: Secondary | ICD-10-CM

## 2013-08-04 DIAGNOSIS — Z1239 Encounter for other screening for malignant neoplasm of breast: Secondary | ICD-10-CM

## 2013-08-04 LAB — COMPLETE METABOLIC PANEL WITH GFR
ALBUMIN: 4.6 g/dL (ref 3.5–5.2)
ALT: 21 U/L (ref 0–35)
AST: 21 U/L (ref 0–37)
Alkaline Phosphatase: 41 U/L (ref 39–117)
BUN: 17 mg/dL (ref 6–23)
CALCIUM: 9.8 mg/dL (ref 8.4–10.5)
CHLORIDE: 100 meq/L (ref 96–112)
CO2: 21 meq/L (ref 19–32)
Creat: 1.12 mg/dL — ABNORMAL HIGH (ref 0.50–1.10)
GFR, EST AFRICAN AMERICAN: 65 mL/min
GFR, EST NON AFRICAN AMERICAN: 56 mL/min — AB
Glucose, Bld: 105 mg/dL — ABNORMAL HIGH (ref 70–99)
Potassium: 4.8 mEq/L (ref 3.5–5.3)
Sodium: 132 mEq/L — ABNORMAL LOW (ref 135–145)
Total Bilirubin: 0.3 mg/dL (ref 0.2–1.2)
Total Protein: 7.3 g/dL (ref 6.0–8.3)

## 2013-08-04 NOTE — Progress Notes (Signed)
Patient ID: Erika Harvey MRN: [id number], DOB: May 17, 1961, 53 y.o. Date of Encounter: @DATE @  Chief Complaint:  Chief Complaint  Patient presents with  . 6 mth check up    is fasting    HPI: 53 y.o. year old female  presents 4 routine follow up office visit.  Her last regular visit with me was 02/01/13. At that time she had complaints of pain in her left thigh down the lateral aspect of her left leg.  Today she reports that she did have followup with Ortho. Seeing Dr. Cyndia Diver orthopedics regarding her back. Says that she had an MRI. States that as it turns out she has some scoliosis which she never knew she had. Says it also showed a problem area. On the left she thinks at level L4-L5. Says that she is scheduled to followup there with an injection this upcoming Tuesday. They are prescribing the pain medications that she needs in the interim. In the past she is also seen other doctors at [facility] including Dr. Veverly Fells and Dr. Alvan Dame who did her knee surgery. Now seeing Dr. Maxie Better for her back.  At her last visit we reviewed her prior weights and the fact that she gained quite a bit of weight. A lot of this has to do with the fact that prior to these orthopedic problems she was walking on the treadmill 5 days per week for 1 mile. Today she states that she still is unable to do any exercise secondary to these problems. Says that she feels much better with exercise and really would like to get back to the exercise.  States that her anxiety is well controlled with the Celexa 40 mg and is using the Xanax. Says that her current job is the perfect job situation. States that she is making more money than she ever has. Says that she really enjoys the environment and the people there. Says that she feels even more relief because she just had an evaluation and got a perfect score. Says that things that she thinks aggravate her anxiety now are just the pain that she experiences and  her husband getting on her nerves !!  Taking blood pressure medications as directed. Has no adverse effects. No lightheadedness and no lower extremity edema.  today's reading is good but pt says it was even lower at orthopedics recently.   Past Medical History  Diagnosis Date  . Hypertension   . Anxiety   . Frequent UTI      Home Meds: See attached medication section for current medication list. Any medications entered into computer today will not appear on this note's list. The medications listed below were entered prior to today. Current Outpatient Prescriptions on File Prior to Visit  Medication Sig Dispense Refill  . ALPRAZolam (XANAX) 0.5 MG tablet TAKE 1 TABLET BY MOUTH 3 TIMES DAILY AS NEEDED  90 tablet  2  . aspirin 81 MG tablet Take 81 mg by mouth daily.        Marland Kitchen atenolol (TENORMIN) 25 MG tablet TAKE 1 TABLET BY MOUTH EVERY DAY  30 tablet  5  . calcium-vitamin D (OSCAL WITH D) 250-125 MG-UNIT per tablet Take 1 tablet by mouth daily.        . cetirizine (ZYRTEC) 10 MG tablet Take 10 mg by mouth daily.      . citalopram (CELEXA) 40 MG tablet TAKE 1 TABLET EVERY DAY  30 tablet  0  . fish oil-omega-3 fatty acids 1000  MG capsule Take 2 g by mouth 2 (two) times daily.        . hydrochlorothiazide (HYDRODIURIL) 25 MG tablet TAKE 1 TABLET EVERY DAY  30 tablet  1  . losartan (COZAAR) 50 MG tablet TAKE 1 TABLET BY MOUTH EVERY DAY  30 tablet  3  . meloxicam (MOBIC) 15 MG tablet Take 1 tablet by mouth daily.      . norgestrel-ethinyl estradiol (LO/OVRAL,CRYSELLE) 0.3-30 MG-MCG tablet 1 po qd  1 Package  0  . sulfamethoxazole-trimethoprim (BACTRIM DS) 800-160 MG per tablet TAKE 1 TABLET AFTER INTERCOURSE AS NEEDED  30 tablet  3   No current facility-administered medications on file prior to visit.    Allergies:  Allergies  Allergen Reactions  . Norvasc [Amlodipine Besylate] Swelling and Rash    History   Social History  . Marital Status: Married    Spouse Name: N/A    Number of  Children: N/A  . Years of Education: N/A   Occupational History  . Not on file.   Social History Main Topics  . Smoking status: Former Research scientist (life sciences)  . Smokeless tobacco: Not on file  . Alcohol Use: 3.6 oz/week    6 Cans of beer per week  . Drug Use: No  . Sexual Activity:    Other Topics Concern  . Not on file   Social History Narrative  . No narrative on file    No family history on file.   Review of Systems:  See HPI for pertinent ROS. All other ROS negative.    Physical Exam: Blood pressure 128/80, pulse 68, temperature 97.6 F (36.4 C), temperature source Oral, resp. rate 18, height 5\' 4"  (1.626 m), weight 149 lb (67.586 kg)., Body mass index is 25.56 kg/(m^2). General: WNWD WF. Appears in no acute distress. Neck: Supple. No thyromegaly. No lymphadenopathy. No carotid bruits Lungs: Clear bilaterally to auscultation without wheezes, rales, or rhonchi. Breathing is unlabored. Heart: RRR with S1 S2. No murmurs, rubs, or gallops. Abdomen: Soft, non-tender, non-distended with normoactive bowel sounds. No hepatomegaly. No rebound/guarding. No obvious abdominal masses. Musculoskeletal:  Strength and tone normal for age. Extremities/Skin: Warm and dry. No clubbing or cyanosis. No edema. No rashes or suspicious lesions. Neuro: Alert and oriented X 3. Moves all extremities spontaneously. Gait is normal. CNII-XII grossly in tact. Psych:  Responds to questions appropriately with a normal affect.     ASSESSMENT AND PLAN:  53 y.o. year old female with  1. Anxiety Controlled on Current medications. NO prescription of Xanax was given today. On 06/21/14 three prescriptions were printed-- the last of which is to fill on 08/22/13.  She will  follow up with me when she needs me to print more prescriptions. - COMPLETE METABOLIC PANEL WITH GFR  2. Hypertension Goal. Continue current medications. Check labs monitor. - COMPLETE METABOLIC PANEL WITH GFR  3. Breast cancer screening She states  that she keeps forgetting to schedule for mammogram. I offered to schedule this for her and she is agreeable with this. - MM Digital Screening; Future  4. she states that she definitely wants to keep taking birth control pills. She thinks she may be going through menopause because she is experiencing hot flashes. I discussed with her to go off of the pills for a few months so that she can see. However she refuses. Absolutely does not want to take any chance of getting pregnant. She is aware of possible increased risk of blood clots given her age and poor meds.  She is agreeable with this risk and wants to continue medication.  5. weight gain: 02/2002:36   05/2012:151   7/14 157   Today 149.  For  preventive care see her complete physical exam note dated 05/25/2012  Encouraged her to schedule another complete physical exam. Otherwise regular visit to 6 months.   Marin Olp [place], [place], [facility] 08/04/2013 8:38 AM

## 2013-08-09 ENCOUNTER — Ambulatory Visit (HOSPITAL_COMMUNITY)
Admission: RE | Admit: 2013-08-09 | Discharge: 2013-08-09 | Disposition: A | Discharge status: Home / Self Care | Payer: BC Managed Care – PPO | Source: Ambulatory Visit | Attending: Physician Assistant | Admitting: Physician Assistant

## 2013-08-09 DIAGNOSIS — Z1239 Encounter for other screening for malignant neoplasm of breast: Secondary | ICD-10-CM

## 2013-08-09 DIAGNOSIS — Z1231 Encounter for screening mammogram for malignant neoplasm of breast: Secondary | ICD-10-CM | POA: Insufficient documentation

## 2013-08-13 ENCOUNTER — Telehealth: Payer: Self-pay | Admitting: Family Medicine

## 2013-08-13 ENCOUNTER — Encounter: Payer: Self-pay | Admitting: Family Medicine

## 2013-08-13 NOTE — Telephone Encounter (Signed)
Letter to patient with provider recommendations.

## 2013-08-13 NOTE — Telephone Encounter (Signed)
Message copied by Olena Mater on Fri Aug 13, 2013 12:17 PM ------      Message from: Dena Billet      Created: Thu Aug 05, 2013  7:32 AM       Tell patient labs indicate some mild issues with her kidneys and to limit her Mobic use.      Otherwise labs are fine and continue other medicines as they are. ------

## 2013-08-26 ENCOUNTER — Other Ambulatory Visit: Payer: Self-pay | Admitting: Physician Assistant

## 2013-08-26 NOTE — Telephone Encounter (Signed)
Refill appropriate and filled per protocol.

## 2013-08-27 ENCOUNTER — Other Ambulatory Visit: Payer: Self-pay | Admitting: Physician Assistant

## 2013-08-27 NOTE — Telephone Encounter (Signed)
Refill appropriate and filled per protocol. 

## 2013-09-06 ENCOUNTER — Other Ambulatory Visit: Payer: Self-pay | Admitting: Physician Assistant

## 2013-09-06 NOTE — Telephone Encounter (Signed)
Medication refilled per protocol. 

## 2013-09-07 NOTE — Telephone Encounter (Signed)
Refill appropriate and filled per protocol. 

## 2013-09-19 ENCOUNTER — Other Ambulatory Visit: Payer: Self-pay | Admitting: Physician Assistant

## 2013-09-19 DIAGNOSIS — F419 Anxiety disorder, unspecified: Secondary | ICD-10-CM

## 2013-09-20 NOTE — Telephone Encounter (Signed)
Last Rf 06/18/14 #90 + 2.  Last OV 08/04/13.  OK refill?

## 2013-09-20 NOTE — Telephone Encounter (Signed)
Approved for #90+2 additional refills 

## 2013-09-20 NOTE — Telephone Encounter (Signed)
RX called in .

## 2013-10-01 ENCOUNTER — Telehealth: Payer: Self-pay | Admitting: Family Medicine

## 2013-10-01 NOTE — Telephone Encounter (Signed)
Patient was requesting a sleep aid.  I asked her if she was using her Alprazolam TID prn and was she using at night?  She said "NO", the last prescription she picked up was "Lorazepam"  And it was not working a well as the Alprazolam.  Medication history show that Alprazolam was called in on 09/20/13.  I then called [facility].  They state voicemail call in was for Lorazepam!!!.  I am not sure where confusion was made.  Lorazepam prescription was discontinued and Alprazolam prescription restored.  Pt given option of returning unused Lorazepam and having Alprazolam resumed now or finish this one month and then resume.  I then called patient back.  Told her unsure where confusion was made.  Gave her option as just stated.  She is going to return unused Lorazepam and get the Alprazolam.  She states does not have sleep issue with that.  Has appt for routine follow up in July.  If still not sleeping will discuss with provider then.

## 2013-10-01 NOTE — Telephone Encounter (Signed)
Message copied by Olena Mater on Fri Oct 01, 2013  9:18 AM ------      Message from: Kristine Garbe      Created: Fri Oct 01, 2013  8:09 AM      Contact: [phone]       Please call her -she wants something to help her sleep ------

## 2013-10-02 NOTE — Telephone Encounter (Signed)
I agree. Can print Rxes for Alprazolam 0.5mg  one po TID # 90--can print total of 3 of these. One to fill 10/04/13, 11/04/13, 12/04/13. Can give her these when she brings in remainder of the lorazepm pills/bottle.

## 2013-10-04 NOTE — Telephone Encounter (Signed)
Pt returned Lorazepam to the pharmacy.  Alprazolam was called in.

## 2013-10-06 ENCOUNTER — Encounter: Payer: Self-pay | Admitting: Family Medicine

## 2013-10-07 NOTE — Telephone Encounter (Signed)
Refill appropriate and filled per protocol.  Not an erroneous encounter.

## 2013-10-07 NOTE — Telephone Encounter (Signed)
This encounter was created in error - please disregard.

## 2013-10-17 ENCOUNTER — Other Ambulatory Visit: Payer: Self-pay | Admitting: Family Medicine

## 2013-11-01 ENCOUNTER — Other Ambulatory Visit: Payer: Self-pay | Admitting: Physician Assistant

## 2013-11-01 NOTE — Telephone Encounter (Signed)
LOV 08/04/13.  Due for OV Q 6 months. Tell her to schedule next OV as CPE with Pelvic & Breast Exam--30 minute appointment. 6 Month visit would be due at the end of July. OK to send Refill on BCP to last until then--one pack + 2 refills.

## 2013-11-01 NOTE — Telephone Encounter (Signed)
lmtcb

## 2013-11-11 ENCOUNTER — Encounter: Payer: Self-pay | Admitting: Family Medicine

## 2013-11-11 NOTE — Telephone Encounter (Signed)
Pt not called back, sent letter to change July appt to CPE.

## 2013-11-25 ENCOUNTER — Other Ambulatory Visit: Payer: Self-pay | Admitting: Physician Assistant

## 2013-11-25 NOTE — Telephone Encounter (Signed)
ok 

## 2013-11-25 NOTE — Telephone Encounter (Signed)
Ok to refill??  Last office visit 08/04/2013.  Last refill 09/20/2013.

## 2013-11-25 NOTE — Telephone Encounter (Signed)
Medication called to pharmacy. 

## 2013-11-29 ENCOUNTER — Other Ambulatory Visit: Payer: Self-pay | Admitting: Family Medicine

## 2013-11-30 ENCOUNTER — Other Ambulatory Visit: Payer: Self-pay | Admitting: Physician Assistant

## 2013-11-30 NOTE — Telephone Encounter (Signed)
Refill appropriate and filled per protocol. 

## 2013-12-24 ENCOUNTER — Other Ambulatory Visit: Payer: Self-pay | Admitting: Family Medicine

## 2013-12-24 NOTE — Telephone Encounter (Signed)
Give 30 day supply,needs OV before any further refills

## 2013-12-24 NOTE — Telephone Encounter (Signed)
Pt has CPE scheduled 02/02/14

## 2013-12-24 NOTE — Telephone Encounter (Signed)
Ok to refill??  Last office visit 08/04/2013.  Last refill 11/25/2013.

## 2014-01-05 ENCOUNTER — Other Ambulatory Visit: Payer: Self-pay | Admitting: Physician Assistant

## 2014-01-05 NOTE — Telephone Encounter (Signed)
Prescription sent to pharmacy.

## 2014-01-18 ENCOUNTER — Other Ambulatory Visit: Payer: Self-pay | Admitting: Physician Assistant

## 2014-01-18 NOTE — Telephone Encounter (Signed)
Prescription sent to pharmacy.

## 2014-01-20 ENCOUNTER — Other Ambulatory Visit: Payer: Self-pay | Admitting: Family Medicine

## 2014-01-21 NOTE — Telephone Encounter (Signed)
ok 

## 2014-01-21 NOTE — Telephone Encounter (Signed)
Medication called to pharmacy. 

## 2014-01-21 NOTE — Telephone Encounter (Signed)
Ok to refill??  Last office visit 08/04/2013.  Last refill 12/24/2013.

## 2014-01-25 ENCOUNTER — Other Ambulatory Visit: Payer: Self-pay | Admitting: Physician Assistant

## 2014-01-26 ENCOUNTER — Encounter: Payer: Self-pay | Admitting: Family Medicine

## 2014-01-26 NOTE — Telephone Encounter (Signed)
Medication refilled per protocol. 

## 2014-02-02 ENCOUNTER — Ambulatory Visit (INDEPENDENT_AMBULATORY_CARE_PROVIDER_SITE_OTHER): Payer: BC Managed Care – PPO | Admitting: Physician Assistant

## 2014-02-02 ENCOUNTER — Encounter: Payer: Self-pay | Admitting: Physician Assistant

## 2014-02-02 VITALS — BP 132/84 | HR 72 | Temp 97.4°F | Resp 18 | Ht 64.0 in | Wt 155.0 lb

## 2014-02-02 DIAGNOSIS — F411 Generalized anxiety disorder: Secondary | ICD-10-CM

## 2014-02-02 DIAGNOSIS — N390 Urinary tract infection, site not specified: Secondary | ICD-10-CM

## 2014-02-02 DIAGNOSIS — Z0000 Encounter for general adult medical examination without abnormal findings: Secondary | ICD-10-CM

## 2014-02-02 DIAGNOSIS — F419 Anxiety disorder, unspecified: Secondary | ICD-10-CM

## 2014-02-02 DIAGNOSIS — I10 Essential (primary) hypertension: Secondary | ICD-10-CM

## 2014-02-02 DIAGNOSIS — M545 Low back pain, unspecified: Secondary | ICD-10-CM

## 2014-02-02 DIAGNOSIS — G8929 Other chronic pain: Secondary | ICD-10-CM | POA: Insufficient documentation

## 2014-02-02 DIAGNOSIS — Z23 Encounter for immunization: Secondary | ICD-10-CM

## 2014-02-02 LAB — CBC WITH DIFFERENTIAL/PLATELET
Basophils Absolute: 0.1 10*3/uL (ref 0.0–0.1)
Basophils Relative: 1 % (ref 0–1)
EOS PCT: 1 % (ref 0–5)
Eosinophils Absolute: 0.1 10*3/uL (ref 0.0–0.7)
HEMATOCRIT: 37.4 % (ref 36.0–46.0)
Hemoglobin: 12.7 g/dL (ref 12.0–15.0)
LYMPHS ABS: 1.4 10*3/uL (ref 0.7–4.0)
LYMPHS PCT: 23 % (ref 12–46)
MCH: 33.9 pg (ref 26.0–34.0)
MCHC: 34 g/dL (ref 30.0–36.0)
MCV: 99.7 fL (ref 78.0–100.0)
MONO ABS: 0.4 10*3/uL (ref 0.1–1.0)
Monocytes Relative: 6 % (ref 3–12)
Neutro Abs: 4.3 10*3/uL (ref 1.7–7.7)
Neutrophils Relative %: 69 % (ref 43–77)
Platelets: 319 10*3/uL (ref 150–400)
RBC: 3.75 MIL/uL — AB (ref 3.87–5.11)
RDW: 12.5 % (ref 11.5–15.5)
WBC: 6.2 10*3/uL (ref 4.0–10.5)

## 2014-02-02 LAB — COMPLETE METABOLIC PANEL WITH GFR
ALBUMIN: 4.6 g/dL (ref 3.5–5.2)
ALT: 26 U/L (ref 0–35)
AST: 20 U/L (ref 0–37)
Alkaline Phosphatase: 39 U/L (ref 39–117)
BUN: 21 mg/dL (ref 6–23)
CALCIUM: 9.7 mg/dL (ref 8.4–10.5)
CHLORIDE: 103 meq/L (ref 96–112)
CO2: 23 meq/L (ref 19–32)
CREATININE: 0.89 mg/dL (ref 0.50–1.10)
GFR, Est African American: 86 mL/min
GFR, Est Non African American: 74 mL/min
Glucose, Bld: 113 mg/dL — ABNORMAL HIGH (ref 70–99)
POTASSIUM: 5 meq/L (ref 3.5–5.3)
Sodium: 136 mEq/L (ref 135–145)
Total Bilirubin: 0.3 mg/dL (ref 0.2–1.2)
Total Protein: 7.2 g/dL (ref 6.0–8.3)

## 2014-02-02 LAB — LIPID PANEL
CHOLESTEROL: 195 mg/dL (ref 0–200)
HDL: 72 mg/dL (ref >39)
LDL CALC: 96 mg/dL (ref 0–99)
Total CHOL/HDL Ratio: 2.7 Ratio
Triglycerides: 133 mg/dL (ref <150)
VLDL: 27 mg/dL (ref 0–40)

## 2014-02-02 LAB — TSH: TSH: 2.236 u[IU]/mL (ref 0.350–4.500)

## 2014-02-02 NOTE — Addendum Note (Signed)
Addended by: Olena Mater on: 02/02/2014 09:21 AM   Modules accepted: Orders

## 2014-02-02 NOTE — Progress Notes (Signed)
Patient ID: Erika Harvey MRN: [id number], DOB: 02-14-1961, 53 y.o. Date of Encounter: 02/02/2014,   Chief Complaint: Physical (CPE)  HPI: 53 y.o. y/o female  here for CPE.   She has no specific complaints today. Says she has been having a lot of low back pain. Has been seeing Dr. Tonita Cong at [facility] Has an appointment today with Dr. [facility] Blanco [facility]. Today will be her first visit there at pain management.   Her last regular visit with me was 02/01/13. At that time she had complaints of pain in her left thigh down the lateral aspect of her left leg. Today she reports that she did have followup with Ortho. Seeing Dr. Cyndia Diver orthopedics regarding her back. Says that she had an MRI. States that as it turns out she has some scoliosis which she never knew she had. Says it also showed a problem area. On the left she thinks at level L4-L5. Says that she is scheduled to followup there with an injection this upcoming Tuesday.  They are prescribing the pain medications that she needs in the interim.  In the past she is also seen other doctors at [facility] including Dr. Veverly Fells and Dr. Alvan Dame who did her knee surgery. Now seeing Dr. Maxie Better for her back.  At her last visit we reviewed her prior weights and the fact that she gained quite a bit of weight. A lot of this has to do with the fact that prior to these orthopedic problems she was walking on the treadmill 5 days per week for 1 mile. Today she states that she still is unable to do any exercise secondary to these problems. Says that she feels much better with exercise and really would like to get back to the exercise.  States that her anxiety is well controlled with the Celexa 40 mg and is using the Xanax. Says that her current job is the perfect job situation. States that she is making more money than she ever has. Says that she really enjoys the environment and the people there. Says that she feels even more  relief because she just had an evaluation and got a perfect score. Says that things that she thinks aggravate her anxiety now are just the pain that she experiences and her husband getting on her nerves !!  Taking blood pressure medications as directed. Has no adverse effects. No lightheadedness and no lower extremity edema. today's reading is good but pt says it was even lower at orthopedics recently.   Review of Systems: Consitutional: No fever, chills, fatigue, night sweats, lymphadenopathy. No significant/unexplained weight changes. Eyes: No visual changes, eye redness, or discharge. ENT/Mouth: No ear pain, sore throat, nasal drainage, or sinus pain. Cardiovascular: No chest pressure,heaviness, tightness or squeezing, even with exertion. No increased shortness of breath or dyspnea on exertion.No palpitations, edema, orthopnea, PND. Respiratory: No cough, hemoptysis, SOB, or wheezing. Gastrointestinal: No anorexia, dysphagia, reflux, pain, nausea, vomiting, hematemesis, diarrhea, constipation, BRBPR, or melena. Breast: No mass, nodules, bulging, or retraction. No skin changes or inflammation. No nipple discharge. No lymphadenopathy. Genitourinary: No dysuria, hematuria, incontinence, vaginal discharge, pruritis, burning, abnormal bleeding, or pain. Musculoskeletal: No decreased ROM, No joint pain or swelling. No significant pain in neck, back, or extremities. Skin: No rash, pruritis, or concerning lesions. Neurological: No headache, dizziness, syncope, seizures, tremors, memory loss, coordination problems, or paresthesias. Psychological: No anxiety, depression, hallucinations, SI/HI. Endocrine: No polydipsia, polyphagia, polyuria, or known diabetes.No increased fatigue. No palpitations/rapid heart rate.  No significant/unexplained weight change. All other systems were reviewed and are otherwise negative.  Past Medical History  Diagnosis Date  . Hypertension   . Anxiety   . Frequent UTI   .  Chronic low back pain      Past Surgical History  Procedure Laterality Date  . Knee surgery      left  . Wisdom tooth extraction    . Knee surgery Left 07/08/2012    Home Meds:  Outpatient Prescriptions Prior to Visit  Medication Sig Dispense Refill  . ALPRAZolam (XANAX) 0.5 MG tablet TAKE 1 TABLET BY MOUTH THREE TIMES DAILY AS NEEDED FOR ANXIETY  90 tablet  0  . aspirin 81 MG tablet Take 81 mg by mouth daily.        Marland Kitchen atenolol (TENORMIN) 25 MG tablet TAKE 1 TABLET BY MOUTH EVERY DAY  30 tablet  5  . calcium-vitamin D (OSCAL WITH D) 250-125 MG-UNIT per tablet Take 1 tablet by mouth daily.        . cetirizine (ZYRTEC) 10 MG tablet Take 10 mg by mouth daily.      . citalopram (CELEXA) 40 MG tablet TAKE 1 TABLET EVERY DAY  30 tablet  3  . CRYSELLE-28 0.3-30 MG-MCG tablet TAKE 1 TABLET BY MOUTH ONCE DAILY  28 tablet  2  . fish oil-omega-3 fatty acids 1000 MG capsule Take 2 g by mouth 2 (two) times daily.        . hydrochlorothiazide (HYDRODIURIL) 25 MG tablet TAKE 1 TABLET EVERY DAY  30 tablet  3  . HYDROcodone-acetaminophen (NORCO/VICODIN) 5-325 MG per tablet Take 1 tablet by mouth every 6 (six) hours as needed.      Marland Kitchen losartan (COZAAR) 50 MG tablet TAKE 1 TABLET BY MOUTH EVERY DAY  30 tablet  3  . meloxicam (MOBIC) 15 MG tablet Take 1 tablet by mouth daily.      Marland Kitchen sulfamethoxazole-trimethoprim (BACTRIM DS) 800-160 MG per tablet TAKE 1 TABLET BY MOUTH ONCE AFTER INTERCOURSE AS NEEDED  30 tablet  3   No facility-administered medications prior to visit.    Allergies:  Allergies  Allergen Reactions  . Norvasc [Amlodipine Besylate] Swelling and Rash    History   Social History  . Marital Status: Married    Spouse Name: N/A    Number of Children: N/A  . Years of Education: N/A   Occupational History  . Not on file.   Social History Main Topics  . Smoking status: Never Smoker   . Smokeless tobacco: Never Used  . Alcohol Use: 14.4 oz/week    24 Cans of beer per week  . Drug  Use: No  . Sexual Activity: Not on file   Other Topics Concern  . Not on file   Social History Narrative  . No narrative on file    Family History  Problem Relation Age of Onset  . Cancer Mother 32    Breast Cancer  . Heart disease Father 36  . Diabetes Father     Physical Exam: Blood pressure 132/84, pulse 72, temperature 97.4 F (36.3 C), temperature source Oral, resp. rate 18, height 5\' 4"  (1.626 m), weight 155 lb (70.308 kg)., Body mass index is 26.59 kg/(m^2). General: Well developed, well nourished, WF. Appears in no acute distress. HEENT: Normocephalic, atraumatic. Conjunctiva pink, sclera non-icteric. Pupils 2 mm constricting to 1 mm, round, regular, and equally reactive to light and accomodation. EOMI. Internal auditory canal clear. TMs with good cone of light and  without pathology. Nasal mucosa pink. Nares are without discharge. No sinus tenderness. Oral mucosa pink.  Pharynx without exudate.   Neck: Supple. Trachea midline. No thyromegaly. Full ROM. No lymphadenopathy.No Carotid Bruits. Lungs: Clear to auscultation bilaterally without wheezes, rales, or rhonchi. Breathing is of normal effort and unlabored. Cardiovascular: RRR with S1 S2. No murmurs, rubs, or gallops. Distal pulses 2+ symmetrically. No carotid or abdominal bruits. Breast: Symmetrical. No masses. Nipples without discharge. Abdomen: Soft, non-tender, non-distended with normoactive bowel sounds. No hepatosplenomegaly or masses. No rebound/guarding. No CVA tenderness. No hernias.  Genitourinary:  External genitalia without lesions. Vaginal mucosa pink.No discharge present. Cervix pink and without discharge. No cervical tenderness.Normal uterus size. No adnexal mass or tenderness.  Pap smear taken Musculoskeletal: Full range of motion and 5/5 strength throughout. Skin: Warm and moist without erythema, ecchymosis, wounds, or rash. Neuro: A+Ox3. CN II-XII grossly intact. Moves all extremities spontaneously. Full  sensation throughout. Normal gait. DTR 2+ throughout upper and lower extremities. Finger to nose intact. Psych:  Responds to questions appropriately with a normal affect.   Assessment/Plan:  53 y.o. y/o female here for CPE 1. Visit for preventive health examination  A. Screening Labs: She is fasting. - CBC with Differential - COMPLETE METABOLIC PANEL WITH GFR - Lipid panel - TSH  B. Pap: Last Pap smear was done by me 05/08/2010. Normal.  PAP, Thin Prep w/HPV rflx HPV Type 16/18   C. Screening Mammogram: Her mammogram is up to date. This was last performed 08/09/2013.  D. DEXA/BMD:  Can wait to discuss closer to age 62  E. Colorectal Cancer Screening: She has not had screening colonoscopy in the past but now is agreeable for me to go ahead and schedule referral for this.  Ambulatory referral to Gastroenterology  F. Immunizations:  Influenza: N/A Tetanus: She has not had a tetanus in the last 10 years. She is agreeable to update this today. Pneumococcal: No indication to give this until age 12 Zostavax: Discussed this at age 66    2. Family History Of Breast Cancer Mother was diagnosed with breast cancer at age 73 y/o. Died at age 66. Mammogram is up to date. Last was performed 08/09/2013. Breast exam performed today.  2. Essential hypertension Blood pressure at goal. Continue current medication. Check labs monitor. - COMPLETE METABOLIC PANEL WITH GFR  3. Anxiety Controlled with current medication  4. Frequent UTI  5. Chronic low back pain She has been seeing Dr. Tonita Cong at [facility]. Has her first visit with the pain clinic today.  Routine office visit in 6 months or sooner if needed.  Signed, [address] [place], [place], [facility] 02/02/2014 8:51 AM

## 2014-02-04 LAB — PAP, THIN PREP W/HPV RFLX HPV TYPE 16/18: HPV DNA High Risk: NOT DETECTED

## 2014-02-07 ENCOUNTER — Encounter: Payer: Self-pay | Admitting: *Deleted

## 2014-02-09 ENCOUNTER — Encounter: Payer: Self-pay | Admitting: *Deleted

## 2014-02-09 ENCOUNTER — Other Ambulatory Visit: Payer: Self-pay | Admitting: *Deleted

## 2014-02-09 DIAGNOSIS — G894 Chronic pain syndrome: Secondary | ICD-10-CM | POA: Insufficient documentation

## 2014-02-09 DIAGNOSIS — M5417 Radiculopathy, lumbosacral region: Secondary | ICD-10-CM | POA: Insufficient documentation

## 2014-02-09 DIAGNOSIS — M4300 Spondylolysis, site unspecified: Secondary | ICD-10-CM | POA: Insufficient documentation

## 2014-02-09 DIAGNOSIS — M5126 Other intervertebral disc displacement, lumbar region: Secondary | ICD-10-CM | POA: Insufficient documentation

## 2014-02-09 DIAGNOSIS — M5136 Other intervertebral disc degeneration, lumbar region: Secondary | ICD-10-CM | POA: Insufficient documentation

## 2014-02-12 ENCOUNTER — Other Ambulatory Visit: Payer: Self-pay | Admitting: Physician Assistant

## 2014-02-12 NOTE — Telephone Encounter (Signed)
Refill appropriate and filled per protocol. 

## 2014-02-12 NOTE — Telephone Encounter (Signed)
?   Ok to refill, last ov 01/2014

## 2014-02-15 ENCOUNTER — Other Ambulatory Visit: Payer: Self-pay | Admitting: Family Medicine

## 2014-02-18 ENCOUNTER — Other Ambulatory Visit: Payer: Self-pay | Admitting: Family Medicine

## 2014-02-18 NOTE — Telephone Encounter (Signed)
Medication called to pharmacy. 

## 2014-02-18 NOTE — Telephone Encounter (Signed)
Ok to refill??  Last office visit 02/02/2014.  Last refill 01/21/2014.

## 2014-02-18 NOTE — Telephone Encounter (Signed)
ok 

## 2014-03-20 ENCOUNTER — Other Ambulatory Visit: Payer: Self-pay | Admitting: Family Medicine

## 2014-03-21 ENCOUNTER — Other Ambulatory Visit: Payer: Self-pay | Admitting: Family Medicine

## 2014-03-21 NOTE — Telephone Encounter (Signed)
Ok to refill??  Last office visit 01/27/2014.  Last refill 02/18/2014.

## 2014-03-21 NOTE — Telephone Encounter (Signed)
ok 

## 2014-03-21 NOTE — Telephone Encounter (Signed)
Medication called to pharmacy. 

## 2014-03-29 ENCOUNTER — Encounter: Payer: Self-pay | Admitting: Family Medicine

## 2014-03-29 ENCOUNTER — Ambulatory Visit (INDEPENDENT_AMBULATORY_CARE_PROVIDER_SITE_OTHER): Payer: BC Managed Care – PPO | Admitting: Family Medicine

## 2014-03-29 VITALS — BP 130/78 | HR 68 | Temp 98.5°F | Resp 14 | Ht 63.0 in | Wt 156.0 lb

## 2014-03-29 DIAGNOSIS — M5136 Other intervertebral disc degeneration, lumbar region: Secondary | ICD-10-CM

## 2014-03-29 DIAGNOSIS — G894 Chronic pain syndrome: Secondary | ICD-10-CM

## 2014-03-29 DIAGNOSIS — M159 Polyosteoarthritis, unspecified: Secondary | ICD-10-CM

## 2014-03-29 DIAGNOSIS — M5137 Other intervertebral disc degeneration, lumbosacral region: Secondary | ICD-10-CM

## 2014-03-29 MED ORDER — CELECOXIB 100 MG PO CAPS: 100.0000 mg | ORAL_CAPSULE | Freq: Two times a day (BID) | ORAL | Status: DC

## 2014-03-29 NOTE — Patient Instructions (Signed)
Start the celebrex twice a day  Continue all other medications F/U as needed

## 2014-03-29 NOTE — Progress Notes (Signed)
Patient ID: Erika Harvey, female   DOB: 02-May-1961, 53 y.o.   MRN: [id number]   Subjective:    Patient ID: Erika Harvey, female    DOB: [birth year], 53 y.o.   MRN: [id number]  Patient presents for Arthritis Pain and Discuss Epidural Injection  patient here secondary to arthritis pain. She has known arthritis in her back and bilateral knees she is being followed by orthopedics where she had both knee surgeries she was then referred to Dr. Vira Blanco, do to back and hip pain she is status post epidural injection which worked very good for all of her joint pain for about 2-3 weeks but it is slowly wearing off. She's currently on gabapentin 4 times a day and she was on meloxicam however this made her swell a little bit and was not very effective. She would like to try a different medication for her arthritis. She does not take chronic pain medications she does have some at home but they were in effective. She continues to have stiffness and pain in all of her joints including her hands which are very stiff she was unable to grip jars this past weekend.    Review Of Systems:  GEN- denies fatigue, fever, weight loss,weakness, recent illness HEENT- denies eye drainage, change in vision, nasal discharge, CVS- denies chest pain, palpitations RESP- denies SOB, cough, wheeze ABD- denies N/V, change in stools, abd pain GU- denies dysuria, hematuria, dribbling, incontinence MSK- + joint pain, muscle aches, injury Neuro- denies headache, dizziness, syncope, seizure activity       Objective:    BP 130/78  Pulse 68  Temp(Src) 98.5 F (36.9 C) (Oral)  Resp 14  Ht 5\' 3"  (1.6 m)  Wt 156 lb (70.761 kg)  BMI 27.64 kg/m2 GEN- NAD, alert and oriented x3 Neck- Supple, good ROM CVS- RRR, no murmur RESP-CTAB MSK- bilat hands no deformity noted, no swelling of MIP or PIP, bilat knees- fair ROM, mild crepitus no effusion, spine NT, strength equal bilat UE and LE Ext- no edema        Assessment &  Plan:      Problem List Items Addressed This Visit   Generalized OA   Relevant Medications      celecoxib (CELEBREX) capsule   DDD (degenerative disc disease), lumbar - Primary   Relevant Medications      celecoxib (CELEBREX) capsule   Chronic pain syndrome      Note: This dictation was prepared with Dragon dictation along with smaller phrase technology. Any transcriptional errors that result from this process are unintentional.

## 2014-03-29 NOTE — Assessment & Plan Note (Addendum)
I would try her on Celebrex 100 mg twice a day she will continue her other medications prescribed by her pain physician

## 2014-03-29 NOTE — Assessment & Plan Note (Signed)
He is being followed by the pain clinic and getting epidural injections regarding her back it is interesting that she did improve with the steroids. Regarding her knees hands and her neck as well. She does not have any characteristics of rheumatoid noted on exam and she's had a good amount imaging by orthopedics and none of this suggested that either.

## 2014-03-31 ENCOUNTER — Telehealth: Payer: Self-pay | Admitting: *Deleted

## 2014-03-31 NOTE — Telephone Encounter (Signed)
Received fax requesting PA on Celebrex.   PA submitted.

## 2014-04-01 NOTE — Telephone Encounter (Signed)
Received PA determination.   PA approved.  

## 2014-04-07 ENCOUNTER — Encounter: Payer: Self-pay | Admitting: Physician Assistant

## 2014-04-08 ENCOUNTER — Other Ambulatory Visit: Payer: Self-pay | Admitting: Family Medicine

## 2014-04-08 ENCOUNTER — Telehealth: Payer: Self-pay | Admitting: Family Medicine

## 2014-04-08 NOTE — Telephone Encounter (Signed)
Spoke to pt and she believes that she is having allergies in her eyes and went to store and got some drops and her eyes seem to clear up. If she worsens over the weekend she will either seek medical attention or call us back on Monday for an appt.

## 2014-04-08 NOTE — Telephone Encounter (Signed)
Patient is calling with concern about some blurred vision she is having  [phone]

## 2014-04-11 NOTE — Telephone Encounter (Signed)
Received new fax from pharmacy requesting PA on Celebrex.   Call placed to pharmacy to advise that PA was approved.   Was advised that prescription was paid for out of pocket by patient for $185.00. Reports that insurance will now cover medication and her co-pay is to be $15.00.  Advised that she can come in to pharmacy for refund.   Call placed to patient. [place].

## 2014-04-12 ENCOUNTER — Telehealth: Payer: Self-pay | Admitting: Family Medicine

## 2014-04-12 MED ORDER — HYDROCHLOROTHIAZIDE 25 MG PO TABS: 25.0000 mg | ORAL_TABLET | Freq: Every day | ORAL | Status: DC

## 2014-04-12 NOTE — Telephone Encounter (Signed)
Medication refilled per protocol. 

## 2014-04-14 ENCOUNTER — Telehealth: Payer: Self-pay | Admitting: Family Medicine

## 2014-04-14 ENCOUNTER — Ambulatory Visit: Payer: BC Managed Care – PPO | Admitting: Family Medicine

## 2014-04-14 MED ORDER — NORGESTREL-ETHINYL ESTRADIOL 0.3-30 MG-MCG PO TABS: 1.0000 | ORAL_TABLET | Freq: Every day | ORAL | Status: DC

## 2014-04-14 NOTE — Telephone Encounter (Signed)
Medication refilled per protocol. 

## 2014-04-19 ENCOUNTER — Other Ambulatory Visit: Payer: Self-pay | Admitting: Family Medicine

## 2014-04-20 ENCOUNTER — Other Ambulatory Visit: Payer: Self-pay | Admitting: Family Medicine

## 2014-04-20 NOTE — Telephone Encounter (Signed)
Approved for #90+2 additional refills

## 2014-04-20 NOTE — Telephone Encounter (Signed)
Ok to refill??  Last office visit 03/29/2014.  Last refill 03/21/2014.

## 2014-04-20 NOTE — Telephone Encounter (Signed)
Last Rf 9/14 #90  Last OV 9/22  OK refill?

## 2014-04-21 NOTE — Telephone Encounter (Signed)
RX called in .

## 2014-04-21 NOTE — Telephone Encounter (Signed)
Medication called to pharmacy. 

## 2014-04-21 NOTE — Telephone Encounter (Signed)
ok 

## 2014-05-10 ENCOUNTER — Other Ambulatory Visit: Payer: Self-pay | Admitting: Physician Assistant

## 2014-05-10 NOTE — Telephone Encounter (Signed)
Medication refilled per protocol. 

## 2014-05-18 ENCOUNTER — Other Ambulatory Visit: Payer: Self-pay | Admitting: Family Medicine

## 2014-05-18 NOTE — Telephone Encounter (Signed)
Medication refilled per protocol. 

## 2014-05-20 ENCOUNTER — Other Ambulatory Visit: Payer: Self-pay | Admitting: Family Medicine

## 2014-05-20 NOTE — Telephone Encounter (Signed)
ok 

## 2014-05-20 NOTE — Telephone Encounter (Signed)
?   OK to Refill  

## 2014-06-08 ENCOUNTER — Encounter: Payer: Self-pay | Admitting: Family Medicine

## 2014-06-08 ENCOUNTER — Ambulatory Visit (INDEPENDENT_AMBULATORY_CARE_PROVIDER_SITE_OTHER): Payer: BC Managed Care – PPO | Admitting: Family Medicine

## 2014-06-08 VITALS — BP 128/76 | HR 76 | Temp 98.6°F | Resp 14 | Ht 64.0 in | Wt 149.0 lb

## 2014-06-08 DIAGNOSIS — R350 Frequency of micturition: Secondary | ICD-10-CM

## 2014-06-08 DIAGNOSIS — I10 Essential (primary) hypertension: Secondary | ICD-10-CM

## 2014-06-08 DIAGNOSIS — N3000 Acute cystitis without hematuria: Secondary | ICD-10-CM

## 2014-06-08 LAB — URINALYSIS, MICROSCOPIC ONLY
Crystals: NONE SEEN
RBC / HPF: NONE SEEN RBC/hpf (ref <3)

## 2014-06-08 LAB — URINALYSIS, ROUTINE W REFLEX MICROSCOPIC
Glucose, UA: NEGATIVE mg/dL
Hgb urine dipstick: NEGATIVE
Ketones, ur: NEGATIVE mg/dL
LEUKOCYTES UA: NEGATIVE
Nitrite: NEGATIVE
PROTEIN: 30 mg/dL — AB
Specific Gravity, Urine: >1.03 — ABNORMAL HIGH (ref 1.005–1.030)
UROBILINOGEN UA: 1 mg/dL (ref 0.0–1.0)
pH: 7 (ref 5.0–8.0)

## 2014-06-08 LAB — CBC W/MCH & 3 PART DIFF
HCT: 38.7 % (ref 36.0–46.0)
Hemoglobin: 14.1 g/dL (ref 12.0–15.0)
LYMPHS ABS: 1.2 10*3/uL (ref 0.7–4.0)
Lymphocytes Relative: 14 % (ref 12–46)
MCH: 35.6 pg — ABNORMAL HIGH (ref 26.0–34.0)
MCHC: 36.4 g/dL — ABNORMAL HIGH (ref 30.0–36.0)
MCV: 97.7 fL (ref 78.0–100.0)
NEUTROS ABS: 7 10*3/uL (ref 1.7–7.7)
NEUTROS PCT: 79 % — AB (ref 43–77)
PLATELETS: 397 10*3/uL (ref 150–400)
RBC: 3.96 MIL/uL (ref 3.87–5.11)
RDW: 12.8 % (ref 11.5–15.5)
WBC mixed population %: 7 % (ref 3–18)
WBC mixed population: 0.6 10*3/uL (ref 0.1–1.8)
WBC: 8.9 10*3/uL (ref 4.0–10.5)

## 2014-06-08 LAB — BASIC METABOLIC PANEL
BUN: 16 mg/dL (ref 6–23)
CALCIUM: 9.7 mg/dL (ref 8.4–10.5)
CO2: 23 mEq/L (ref 19–32)
Chloride: 96 mEq/L (ref 96–112)
Creat: 0.96 mg/dL (ref 0.50–1.10)
Glucose, Bld: 111 mg/dL — ABNORMAL HIGH (ref 70–99)
Potassium: 4 mEq/L (ref 3.5–5.3)
Sodium: 131 mEq/L — ABNORMAL LOW (ref 135–145)

## 2014-06-08 MED ORDER — PROMETHAZINE HCL 25 MG PO TABS: 25.0000 mg | ORAL_TABLET | Freq: Three times a day (TID) | ORAL | Status: DC | PRN

## 2014-06-08 NOTE — Patient Instructions (Signed)
Take bactrim 1 tablet twice a day for next 5 days  Phenergan for nausea-  Push fluids We will call with lab results  F/U as needed

## 2014-06-08 NOTE — Assessment & Plan Note (Signed)
BP looks good, check renal function with symptoms as well

## 2014-06-08 NOTE — Progress Notes (Signed)
Patient ID: Erika Harvey, female   DOB: 25-Dec-1960, 53 y.o.   MRN: [id number]   Subjective:    Patient ID: Erika Harvey, female    DOB: June 15, 1961, 53 y.o.   MRN: [id number]  Patient presents for Possible UTI  patient here with nausea some urinary frequency with dribbling for the past 2 days. She has history of urinary tract infections which typically occur after intercourse. She has Bactrim at home she took 1 dose today. She's not had any fever pressures felt bad she's not had any significant loose stool or change in her bowels. No upper respiratory symptoms. She has history of chronic back pain which she is getting epidural injections for but states that her flank pain feels different from her back pain.    Review Of Systems:  GEN- denies fatigue, fever, weight loss,weakness, recent illness HEENT- denies eye drainage, change in vision, nasal discharge, CVS- denies chest pain, palpitations RESP- denies SOB, cough, wheeze ABD- + N/V, change in stools, abd pain GU- + dysuria, hematuria, dribbling, incontinence MSK- denies joint pain, =muscle aches, injury Neuro- denies headache, dizziness, syncope, seizure activity       Objective:    BP 128/76 mmHg  Pulse 76  Temp(Src) 98.6 F (37 C) (Oral)  Resp 14  Ht 5\' 4"  (1.626 m)  Wt 149 lb (67.586 kg)  BMI 25.56 kg/m2 GEN- NAD, alert and oriented x3, non toxic appearing CVS- RRR, no murmur RESP-CTAB ABD-NABS,soft,NT,ND, mild left CVA tenderness EXT- No edema Pulses- Radial 2+        Assessment & Plan:      Problem List Items Addressed This Visit    Hypertension    BP looks good, check renal function with symptoms as well    Relevant Orders      Basic metabolic panel      CBC w/MCH & 3 Part Diff (Completed)    Other Visit Diagnoses    Frequent urination    -  Primary    Relevant Orders       Urinalysis, Routine w reflex microscopic (Completed)       Urine culture    Acute cystitis without hematuria        UA not very convincing for pyelonephritis, will send culture, have her take Bactrim BID until culture come in, phenergan for nausea, CBC, BMET, no red flags    Relevant Orders       CBC w/MCH & 3 Part Diff (Completed)       Urine culture       Note: This dictation was prepared with Dragon dictation along with smaller phrase technology. Any transcriptional errors that result from this process are unintentional.

## 2014-06-09 LAB — URINE CULTURE: Colony Count: 6000

## 2014-06-11 ENCOUNTER — Other Ambulatory Visit: Payer: Self-pay | Admitting: Family Medicine

## 2014-06-14 ENCOUNTER — Telehealth: Payer: Self-pay | Admitting: Family Medicine

## 2014-06-14 NOTE — Telephone Encounter (Signed)
Patient is calling to say that her employer needs a note from [place] stating exactly what is going on with her back  Please call her back at [phone]

## 2014-06-14 NOTE — Telephone Encounter (Signed)
Have not seen patient for back issue since September.  Not sure if still seeing specialist as well.  LMTCB.

## 2014-06-20 ENCOUNTER — Ambulatory Visit: Payer: BC Managed Care – PPO | Admitting: Physician Assistant

## 2014-06-22 NOTE — Telephone Encounter (Signed)
Pt wants to wait for employer to generate a form for her to have provider to fill out in order to understand.pt will make appt once receive paper work.

## 2014-07-14 ENCOUNTER — Other Ambulatory Visit: Payer: Self-pay | Admitting: Family Medicine

## 2014-07-15 NOTE — Telephone Encounter (Signed)
Refill appropriate and filled per protocol. 

## 2014-07-26 ENCOUNTER — Other Ambulatory Visit: Payer: Self-pay | Admitting: Physician Assistant

## 2014-07-26 NOTE — Telephone Encounter (Signed)
Refill appropriate and filled per protocol. 

## 2014-08-04 ENCOUNTER — Ambulatory Visit (INDEPENDENT_AMBULATORY_CARE_PROVIDER_SITE_OTHER): Payer: BLUE CROSS/BLUE SHIELD | Admitting: Physician Assistant

## 2014-08-04 ENCOUNTER — Encounter: Payer: Self-pay | Admitting: Physician Assistant

## 2014-08-04 VITALS — BP 142/88 | HR 72 | Temp 97.8°F | Resp 18 | Wt 148.0 lb

## 2014-08-04 DIAGNOSIS — F419 Anxiety disorder, unspecified: Secondary | ICD-10-CM

## 2014-08-04 DIAGNOSIS — M5126 Other intervertebral disc displacement, lumbar region: Secondary | ICD-10-CM | POA: Diagnosis not present

## 2014-08-04 DIAGNOSIS — Z23 Encounter for immunization: Secondary | ICD-10-CM | POA: Diagnosis not present

## 2014-08-04 DIAGNOSIS — I10 Essential (primary) hypertension: Secondary | ICD-10-CM | POA: Diagnosis not present

## 2014-08-04 DIAGNOSIS — M545 Low back pain: Secondary | ICD-10-CM | POA: Diagnosis not present

## 2014-08-04 DIAGNOSIS — M4300 Spondylolysis, site unspecified: Secondary | ICD-10-CM

## 2014-08-04 DIAGNOSIS — M5136 Other intervertebral disc degeneration, lumbar region: Secondary | ICD-10-CM

## 2014-08-04 DIAGNOSIS — G894 Chronic pain syndrome: Secondary | ICD-10-CM | POA: Diagnosis not present

## 2014-08-04 DIAGNOSIS — M159 Polyosteoarthritis, unspecified: Secondary | ICD-10-CM

## 2014-08-04 DIAGNOSIS — G8929 Other chronic pain: Secondary | ICD-10-CM

## 2014-08-04 DIAGNOSIS — M5416 Radiculopathy, lumbar region: Secondary | ICD-10-CM | POA: Diagnosis not present

## 2014-08-04 DIAGNOSIS — M5417 Radiculopathy, lumbosacral region: Secondary | ICD-10-CM

## 2014-08-04 MED ORDER — LOSARTAN POTASSIUM 100 MG PO TABS: 100.0000 mg | ORAL_TABLET | Freq: Every day | ORAL | Status: DC

## 2014-08-04 MED ORDER — TRAMADOL HCL 50 MG PO TABS: 50.0000 mg | ORAL_TABLET | Freq: Three times a day (TID) | ORAL | Status: DC | PRN

## 2014-08-04 NOTE — Progress Notes (Signed)
Patient ID: Erika Harvey MRN: [id number], DOB: 07/23/1960, 54 y.o. Date of Encounter: 08/04/2014,   Chief Complaint: Routine f/u OV.   HPI: 54 y.o. y/o female  here for routine f/u OV.   At her Ov with me 01/2013  she had complaints of pain in her left thigh down the lateral aspect of her left leg.  At that point, she had just had left knee surgery that prior January. He has started noticing the pain in her left thigh after that knee surgery. Therefore orthopedics at [facility] was managing this.  At her next appt with me she told me she had  followup with Ortho. Seeing Dr. Cyndia Diver orthopedics regarding her back. [name]. States that as it turns out she has some scoliosis which she never knew she had. [name]. On the left she thinks at level L4-L5. Said that she was scheduled to followup there with an injection that upcoming Tuesday.   In the past she has also seen other doctors at [facility] including Dr. Veverly Fells and Dr. Alvan Dame who did her knee surgery. Then  saw Dr. Maxie Better for her back.   At past office visit, we discussed that she had had weight gain. Prior to these orthopedic problems she was walking on the treadmill 5 days per week for 1 mile. However, she has been unable to do any exercise secondary to these problems.  At her last visit with me 01/2014, she told me that she had an appointment later that day with [facility]--That would be her first visit there at [facility].  Today she states that she has continued to see Dr. Vira Blanco  at [facility]. She is getting epidural injections--- one injection on each side---gets injections each month.  Says that the injections really help but says that the effect does wear off.  Says that she has to sit at her desk all day at work in this is contributing to her pain. Says that she also has a long drive to her work and this is also contributing to her  pain.  Says that she also thinks that stress is contributing to her pain and current symptoms. Says that she is thinking about changing jobs and trying to find a new job. This is what she reports to me at office visit 08/04/14. Told her that I was surprised to hear that because I thought in the past that she was happy with her new job. Then reviewed the fact that what she told me at her visit with me 01/2014 was" States that her anxiety is well controlled with the Celexa 40 mg and is using the Xanax. Says that her current job is the perfect job situation. States that she is making more money than she ever has. Says that she really enjoys the environment and the people there. Says that she feels even more relief because she just had an evaluation and got a perfect score. Says that things that she thinks aggravate her anxiety now are just the pain that she experiences and her husband getting on her nerves !! "  At [place] 08/04/14, I reviewed that above statement with her from 01/2014.  She says that- that was true at that time. However she says that things have really changed since then. All of her bosses and managers back then have all been replaced and it has all changed. She gets the sense that  things there are not stable and she is very concerned that her job may not be stable either. Also she says that she has had to miss work to get these injections and had to miss some work when she has been in significant pain. She thinks that this has upset them as well.  She says that recently she really has not been feeling good. Thinks that it is secondary to side effects from all of these pain medications. Also says that she feels worse, in general, the week after she gets these steroid injections.    Taking blood pressure medications as directed. Has no adverse effects. No lightheadedness and no lower extremity edema.  All other pertinent systems were reviewed and are otherwise negative.  Past Medical History    Diagnosis Date  . Hypertension   . Anxiety   . Frequent UTI   . Chronic low back pain      Past Surgical History  Procedure Laterality Date  . Knee surgery      left  . Wisdom tooth extraction    . Knee surgery Left 07/08/2012    Home Meds:  Outpatient Prescriptions Prior to Visit  Medication Sig Dispense Refill  . ALPRAZolam (XANAX) 0.5 MG tablet TAKE 1 TABLET BY MOUTH THREE TIMES DAILY AS NEEDED FOR ANXIETY 90 tablet 0  . aspirin 81 MG tablet Take 81 mg by mouth daily.      Marland Kitchen atenolol (TENORMIN) 25 MG tablet TAKE 1 TABLET BY MOUTH EVERY DAY 30 tablet 2  . calcium-vitamin D (OSCAL WITH D) 250-125 MG-UNIT per tablet Take 1 tablet by mouth daily.      . celecoxib (CELEBREX) 100 MG capsule TAKE 1 CAPSULE (100 MG TOTAL) BY MOUTH 2 (TWO) TIMES DAILY. 60 capsule 3  . cetirizine (ZYRTEC) 10 MG tablet Take 10 mg by mouth daily.    . citalopram (CELEXA) 40 MG tablet TAKE 1 TABLET BY MOUTH ONCE DAILY 30 tablet 3  . fish oil-omega-3 fatty acids 1000 MG capsule Take 2 g by mouth 2 (two) times daily.      Marland Kitchen gabapentin (NEURONTIN) 300 MG capsule Take 1 capsule by mouth 4 (four) times daily.     Marland Kitchen HYDROcodone-acetaminophen (NORCO/VICODIN) 5-325 MG per tablet Take 1 tablet by mouth every 6 (six) hours as needed.    . meloxicam (MOBIC) 15 MG tablet     . norgestrel-ethinyl estradiol (CRYSELLE-28) 0.3-30 MG-MCG tablet Take 1 tablet by mouth daily. 28 tablet 11  . sulfamethoxazole-trimethoprim (BACTRIM DS) 800-160 MG per tablet TAKE 1 TABLET BY MOUTH ONCE AFTER INTERCOURSE AS NEEDED 30 tablet 3  . hydrochlorothiazide (HYDRODIURIL) 25 MG tablet TAKE 1 TABLET (25 MG TOTAL) BY MOUTH DAILY. 30 tablet 3  . losartan (COZAAR) 50 MG tablet TAKE 1 TABLET BY MOUTH EVERY DAY 30 tablet 3  . promethazine (PHENERGAN) 25 MG tablet Take 1 tablet (25 mg total) by mouth every 8 (eight) hours as needed for nausea or vomiting. (Patient not taking: Reported on 08/04/2014) 20 tablet 0   No facility-administered  medications prior to visit.    Allergies:  Allergies  Allergen Reactions  . Norvasc [Amlodipine Besylate] Swelling and Rash    History   Social History  . Marital Status: Married    Spouse Name: N/A    Number of Children: N/A  . Years of Education: N/A   Occupational History  . Not on file.   Social History Main Topics  . Smoking status: Never Smoker   . Smokeless  tobacco: Never Used  . Alcohol Use: 14.4 oz/week    24 Cans of beer per week  . Drug Use: No  . Sexual Activity: Not on file   Other Topics Concern  . Not on file   Social History Narrative    Family History  Problem Relation Age of Onset  . Cancer Mother 73    Breast Cancer  . Heart disease Father 61  . Diabetes Father     Physical Exam: Blood pressure 142/88, pulse 72, temperature 97.8 F (36.6 C), temperature source Oral, resp. rate 18, weight 148 lb (67.132 kg)., Body mass index is 25.39 kg/(m^2). General: Well developed, well nourished, WF. Appears in no acute distress. Neck: Supple. Trachea midline. No thyromegaly. Full ROM. No lymphadenopathy.No Carotid Bruits. Lungs: Clear to auscultation bilaterally without wheezes, rales, or rhonchi. Breathing is of normal effort and unlabored. Cardiovascular: RRR with S1 S2. No murmurs, rubs, or gallops. Distal pulses 2+ symmetrically. No carotid or abdominal bruits. Abdomen: Soft, non-tender, non-distended with normoactive bowel sounds. No hepatosplenomegaly or masses. No rebound/guarding. No CVA tenderness. No hernias.  Musculoskeletal: Full range of motion and 5/5 strength throughout. Skin: Warm and moist without erythema, ecchymosis, wounds, or rash. Neuro: A+Ox3. CN II-XII grossly intact. Moves all extremities spontaneously.  Psych:  Responds to questions appropriately with a normal affect.   Assessment/Plan:  54 y.o. y/o female here for   1. Essential hypertension She c/o dry mouth, feeling thirsty all the time. Will D/C HCTZ and increase losartan  from 50 mg to 100 mg. She will schedule follow-up office visit in 2 weeks to recheck blood pressure and lab work after medication change. - losartan (COZAAR) 100 MG tablet; Take 1 tablet (100 mg total) by mouth daily.  Dispense: 90 tablet; Refill: 3  2. Chronic low back pain Per pain clinic  3. Lumbar herniated disc Per pain clinic  4. Chronic pain syndrome Her pain clinic  5. DDD (degenerative disc disease), lumbar Per pain clinic  6. Spondylolysis Per pain clinic  7. Lumbosacral radiculitis Per pain clinic  8. Generalized OA On Celebrex for this in addition to the treatment by the pain clinic.  9. Anxiety This is controlled with current medication.  10. Need for prophylactic vaccination and inoculation against influenza She is agreeable to receive influenza vaccine today. - Flu Vaccine QUAD 36+ mos PF IM (Fluarix Quad PF)   The following is copied from her CPE---01/2014:  1. Visit for preventive health examination  A. Screening Labs: She is fasting. - CBC with Differential - COMPLETE METABOLIC PANEL WITH GFR - Lipid panel - TSH  B. Pap: Last Pap smear was done by me 05/08/2010. Normal.  PAP, Thin Prep w/HPV rflx HPV Type 16/18   C. Screening Mammogram: Her mammogram is up to date. This was last performed 08/09/2013.  D. DEXA/BMD:  Can wait to discuss closer to age 89  E. Colorectal Cancer Screening: She has not had screening colonoscopy in the past but now is agreeable for me to go ahead and schedule referral for this.  Ambulatory referral to Gastroenterology  F. Immunizations:  Influenza: N/A Tetanus: She has not had a tetanus in the last 10 years. She is agreeable to update this today. Pneumococcal: No indication to give this until age 8 Zostavax: Discussed this at age 33    2. Family History Of Breast Cancer Mother was diagnosed with breast cancer at age 61 y/o. Died at age 16. Mammogram is up to date. Last was performed 08/09/2013.  Breast exam  performed today.  Follow-up office visit 2 weeks to recheck blood pressure and lab work at that time.  [address] [place], [place], [facility] 08/04/2014 8:33 AM

## 2014-08-11 ENCOUNTER — Other Ambulatory Visit: Payer: Self-pay | Admitting: Family Medicine

## 2014-08-12 NOTE — Telephone Encounter (Signed)
?   OK to Refill  - LOV 08/04/14

## 2014-08-15 ENCOUNTER — Other Ambulatory Visit: Payer: Self-pay | Admitting: Physician Assistant

## 2014-08-15 ENCOUNTER — Telehealth: Payer: Self-pay | Admitting: *Deleted

## 2014-08-15 MED ORDER — ALPRAZOLAM 0.5 MG PO TABS: 0.5000 mg | ORAL_TABLET | Freq: Three times a day (TID) | ORAL | Status: DC | PRN

## 2014-08-15 NOTE — Telephone Encounter (Signed)
Medication refilled per protocol. 

## 2014-08-15 NOTE — Telephone Encounter (Signed)
ok 

## 2014-08-15 NOTE — Telephone Encounter (Signed)
Approved for #90+2.

## 2014-08-15 NOTE — Telephone Encounter (Signed)
?   OK to Refill  

## 2014-08-15 NOTE — Telephone Encounter (Signed)
Pt calling needing refills on Alprazolam 0.5mg 

## 2014-08-15 NOTE — Telephone Encounter (Signed)
Med called to pharm 

## 2014-08-18 ENCOUNTER — Ambulatory Visit (INDEPENDENT_AMBULATORY_CARE_PROVIDER_SITE_OTHER): Payer: BLUE CROSS/BLUE SHIELD | Admitting: Physician Assistant

## 2014-08-18 ENCOUNTER — Encounter: Payer: Self-pay | Admitting: Physician Assistant

## 2014-08-18 VITALS — BP 106/62 | HR 72 | Temp 98.7°F | Resp 18 | Wt 150.0 lb

## 2014-08-18 DIAGNOSIS — I10 Essential (primary) hypertension: Secondary | ICD-10-CM

## 2014-08-18 LAB — BASIC METABOLIC PANEL WITH GFR
BUN: 17 mg/dL (ref 6–23)
CHLORIDE: 102 meq/L (ref 96–112)
CO2: 28 mEq/L (ref 19–32)
CREATININE: 0.9 mg/dL (ref 0.50–1.10)
Calcium: 9.6 mg/dL (ref 8.4–10.5)
GFR, Est African American: 84 mL/min
GFR, Est Non African American: 73 mL/min
GLUCOSE: 98 mg/dL (ref 70–99)
POTASSIUM: 4.7 meq/L (ref 3.5–5.3)
Sodium: 139 mEq/L (ref 135–145)

## 2014-08-18 NOTE — Progress Notes (Signed)
Patient ID: Erika Harvey MRN: [id number], DOB: 01/07/1961, 54 y.o. Date of Encounter: 08/18/2014,   Chief Complaint: Routine f/u OV.   HPI: 54 y.o. y/o female  here for routine f/u OV.   At her Ov with me 01/2013  she had complaints of pain in her left thigh down the lateral aspect of her left leg.  At that point, she had just had left knee surgery that prior January. He has started noticing the pain in her left thigh after that knee surgery. Therefore orthopedics at [facility] was managing this.  At her next appt with me she told me she had  followup with Ortho. Seeing Dr. Cyndia Diver orthopedics regarding her back. [name]. States that as it turns out she has some scoliosis which she never knew she had. [name]. On the left she thinks at level L4-L5. Said that she was scheduled to followup there with an injection that upcoming Tuesday.   In the past she has also seen other doctors at [facility] including Dr. Veverly Fells and Dr. Alvan Dame who did her knee surgery. Then  saw Dr. Maxie Better for her back.   At past office visit, we discussed that she had had weight gain. Prior to these orthopedic problems she was walking on the treadmill 5 days per week for 1 mile. However, she has been unable to do any exercise secondary to these problems.  At her last visit with me 01/2014, she told me that she had an appointment later that day with Dr. [facility]--That would be her first visit there at [facility].  Today she states that she has continued to see Dr. Vira Blanco  at [facility]. She is getting epidural injections--- one injection on each side---gets injections each month.  Says that the injections really help but says that the effect does wear off.  Says that she has to sit at her desk all day at work in this is contributing to her pain. Says that she also has a long drive to her work and this is also contributing to her  pain.  Says that she also thinks that stress is contributing to her pain and current symptoms. Says that she is thinking about changing jobs and trying to find a new job. This is what she reports to me at office visit 08/04/14. Told her that I was surprised to hear that because I thought in the past that she was happy with her new job. Then reviewed the fact that what she told me at her visit with me 01/2014 was" States that her anxiety is well controlled with the Celexa 40 mg and is using the Xanax. Says that her current job is the perfect job situation. States that she is making more money than she ever has. Says that she really enjoys the environment and the people there. Says that she feels even more relief because she just had an evaluation and got a perfect score. Says that things that she thinks aggravate her anxiety now are just the pain that she experiences and her husband getting on her nerves !! "  At [facility] 08/04/14, I reviewed that above statement with her from 01/2014.  She says that- that was true at that time. However she says that things have really changed since then. All of her bosses and managers back then have all been replaced and it has all changed. She gets the sense that  things there are not stable and she is very concerned that her job may not be stable either. Also she says that she has had to miss work to get these injections and had to miss some work when she has been in significant pain. She thinks that this has upset them as well.  She says that recently she really has not been feeling good. Thinks that it is secondary to side effects from all of these pain medications. Also says that she feels worse, in general, the week after she gets these steroid injections.  At [facility] 08/04/14 she complained of dry mouth and feeling thirsty all the time. At that visit I stopped her HCTZ. Increased the dose of her losartan. Told her to return in 2 weeks to recheck blood pressure and lab work with  medication change.  She presents today for that follow-up visit. She says that the dry mouth and feeling thirsty has stopped. Is having no side effects with the increased dose of losartan.  At [facility] 08/18/14 she also reports that she has gone back to the pain clinic since her last visit with me. She says that he she did tell him about her symptoms which she feels are side effects from some of the pain medications.  She says that he he did hear her-- but that he just continued to type and really did not respond to her.  She thinks that some of the side effects are from the gabapentin but knows that she has severe pain without it so she has no choice but continue it. She says that at that last appointment at the pain clinic he gave her 8 shots---4  on each side.  She says that she does not know what kind of shots they were.  Says that she wanted to ask but she was in too much pain so decided just not even bother. Says that she knows that he had to get it preapproved with her insurance and thinks that it is kind of on a trial type / research type treatment.    All other pertinent systems were reviewed and are otherwise negative.  Past Medical History  Diagnosis Date  . Hypertension   . Anxiety   . Frequent UTI   . Chronic low back pain      Past Surgical History  Procedure Laterality Date  . Knee surgery      left  . Wisdom tooth extraction    . Knee surgery Left 07/08/2012    Home Meds:  Outpatient Prescriptions Prior to Visit  Medication Sig Dispense Refill  . ALPRAZolam (XANAX) 0.5 MG tablet Take 1 tablet (0.5 mg total) by mouth 3 (three) times daily as needed. for anxiety 90 tablet 0  . aspirin 81 MG tablet Take 81 mg by mouth daily.      Marland Kitchen atenolol (TENORMIN) 25 MG tablet TAKE 1 TABLET BY MOUTH EVERY DAY 30 tablet 0  . calcium-vitamin D (OSCAL WITH D) 250-125 MG-UNIT per tablet Take 1 tablet by mouth daily.      . celecoxib (CELEBREX) 100 MG capsule TAKE 1 CAPSULE (100 MG TOTAL)  BY MOUTH 2 (TWO) TIMES DAILY. 60 capsule 3  . cetirizine (ZYRTEC) 10 MG tablet Take 10 mg by mouth daily.    . citalopram (CELEXA) 40 MG tablet TAKE 1 TABLET BY MOUTH ONCE DAILY 30 tablet 3  . fish oil-omega-3 fatty acids 1000 MG capsule Take 2 g by mouth 2 (two) times daily.      Marland Kitchen  gabapentin (NEURONTIN) 300 MG capsule Take 1 capsule by mouth 4 (four) times daily.     Marland Kitchen losartan (COZAAR) 100 MG tablet Take 1 tablet (100 mg total) by mouth daily. 90 tablet 3  . norgestrel-ethinyl estradiol (CRYSELLE-28) 0.3-30 MG-MCG tablet Take 1 tablet by mouth daily. 28 tablet 11  . sulfamethoxazole-trimethoprim (BACTRIM DS) 800-160 MG per tablet TAKE 1 TABLET BY MOUTH ONCE AFTER INTERCOURSE AS NEEDED 30 tablet 3  . traMADol (ULTRAM) 50 MG tablet Take 1 tablet (50 mg total) by mouth every 8 (eight) hours as needed. 30 tablet 0  . HYDROcodone-acetaminophen (NORCO/VICODIN) 5-325 MG per tablet Take 1 tablet by mouth every 6 (six) hours as needed.    . meloxicam (MOBIC) 15 MG tablet     . promethazine (PHENERGAN) 25 MG tablet Take 1 tablet (25 mg total) by mouth every 8 (eight) hours as needed for nausea or vomiting. (Patient not taking: Reported on 08/04/2014) 20 tablet 0   No facility-administered medications prior to visit.    Allergies:  Allergies  Allergen Reactions  . Norvasc [Amlodipine Besylate] Swelling and Rash    History   Social History  . Marital Status: Married    Spouse Name: N/A  . Number of Children: N/A  . Years of Education: N/A   Occupational History  . Not on file.   Social History Main Topics  . Smoking status: Never Smoker   . Smokeless tobacco: Never Used  . Alcohol Use: 14.4 oz/week    24 Cans of beer per week  . Drug Use: No  . Sexual Activity: Not on file   Other Topics Concern  . Not on file   Social History Narrative    Family History  Problem Relation Age of Onset  . Cancer Mother 22    Breast Cancer  . Heart disease Father 65  . Diabetes Father      Physical Exam: Blood pressure 106/62, pulse 72, temperature 98.7 F (37.1 C), temperature source Oral, resp. rate 18, weight 150 lb (68.04 kg)., Body mass index is 25.73 kg/(m^2). General: Well developed, well nourished, WF. Appears in no acute distress. Neck: Supple. Trachea midline. No thyromegaly. Full ROM. No lymphadenopathy.No Carotid Bruits. Lungs: Clear to auscultation bilaterally without wheezes, rales, or rhonchi. Breathing is of normal effort and unlabored. Cardiovascular: RRR with S1 S2. No murmurs, rubs, or gallops. Distal pulses 2+ symmetrically. No carotid or abdominal bruits. Abdomen: Soft, non-tender, non-distended with normoactive bowel sounds. No hepatosplenomegaly or masses. No rebound/guarding. No CVA tenderness. No hernias.  Musculoskeletal: Full range of motion and 5/5 strength throughout. Skin: Warm and moist without erythema, ecchymosis, wounds, or rash. Neuro: A+Ox3. CN II-XII grossly intact. Moves all extremities spontaneously.  Psych:  Responds to questions appropriately with a normal affect.   Assessment/Plan:  54 y.o. y/o female here for   1. Essential hypertension At [facility] 08/04/14 she  c/o dry mouth, feeling thirsty all the time.  D/Ced  HCTZ and increased losartan from 50 mg to 100 mg. BP is good. Check BMET on new medication.   2. Chronic low back pain Per pain clinic  3. Lumbar herniated disc Per pain clinic  4. Chronic pain syndrome Her pain clinic  5. DDD (degenerative disc disease), lumbar Per pain clinic  6. Spondylolysis Per pain clinic  7. Lumbosacral radiculitis Per pain clinic  8. Generalized OA On Celebrex for this in addition to the treatment by the pain clinic.  9. Anxiety This is controlled with current medication.  The following is copied from her CPE---01/2014:  1. Visit for preventive health examination  A. Screening Labs: She had fasting labs 01/2014 - CBC with  Differential-------------------------------------------Normal - COMPLETE METABOLIC PANEL WITH GFR----------Glucose High. O/W Normal - Lipid panel---------------------------------------------------------Good - TSH-----------------------------------------------------------------Normal  B. Pap: Had Pap smear was done by me 05/08/2010. Normal.  Had Repeat Pap 01/2014 by me--PAP, Thin Prep w/HPV rflx HPV Type 16/18---Normal. --01/2014   C. Screening Mammogram: Her mammogram is up to date. This was last performed 08/09/2013.  D. DEXA/BMD:  Can wait to discuss closer to age 23  E. Colorectal Cancer Screening: She has not had screening colonoscopy in the past but was agreeable for me to go ahead and schedule referral for this--at her CPE 01/2014--Referral to GI Ordered.   F. Immunizations:  Influenza: Given here 08/04/2014 Tetanus: Given here 01/2014 Pneumococcal: No indication to give this until age 66 Zostavax: Discuss this at age 65    2. Family History Of Breast Cancer Mother was diagnosed with breast cancer at age 63 y/o. Died at age 2. Mammogram is up to date. Last was performed 08/09/2013. Breast exam performed at CPE 01/2014.   Marin Olp [place], [place], [facility] 08/18/2014 8:19 AM

## 2014-08-25 ENCOUNTER — Other Ambulatory Visit: Payer: Self-pay | Admitting: Physician Assistant

## 2014-08-25 NOTE — Telephone Encounter (Signed)
rx called in

## 2014-08-25 NOTE — Telephone Encounter (Signed)
ok 

## 2014-08-25 NOTE — Telephone Encounter (Signed)
Ok to refill??  Last office visit 08/18/2014.  Last refill 08/04/2014.

## 2014-08-30 ENCOUNTER — Other Ambulatory Visit: Payer: Self-pay | Admitting: Physician Assistant

## 2014-08-30 NOTE — Telephone Encounter (Signed)
Medication refilled per protocol. 

## 2014-09-10 ENCOUNTER — Other Ambulatory Visit: Payer: Self-pay | Admitting: Physician Assistant

## 2014-09-11 ENCOUNTER — Other Ambulatory Visit: Payer: Self-pay | Admitting: Family Medicine

## 2014-09-12 NOTE — Telephone Encounter (Signed)
Medication refilled per protocol. 

## 2014-09-12 NOTE — Telephone Encounter (Signed)
?   OK to Refill  

## 2014-09-12 NOTE — Telephone Encounter (Signed)
rx called in

## 2014-09-12 NOTE — Telephone Encounter (Signed)
okay

## 2014-09-19 ENCOUNTER — Ambulatory Visit (HOSPITAL_COMMUNITY): Payer: BLUE CROSS/BLUE SHIELD | Attending: Family Medicine | Admitting: Cardiology

## 2014-09-19 ENCOUNTER — Encounter: Payer: Self-pay | Admitting: Physician Assistant

## 2014-09-19 ENCOUNTER — Ambulatory Visit (INDEPENDENT_AMBULATORY_CARE_PROVIDER_SITE_OTHER): Payer: BLUE CROSS/BLUE SHIELD | Admitting: Physician Assistant

## 2014-09-19 VITALS — BP 110/80 | HR 56 | Temp 98.3°F | Resp 18 | Wt 152.0 lb

## 2014-09-19 DIAGNOSIS — M79605 Pain in left leg: Secondary | ICD-10-CM | POA: Insufficient documentation

## 2014-09-19 DIAGNOSIS — M7989 Other specified soft tissue disorders: Secondary | ICD-10-CM | POA: Diagnosis not present

## 2014-09-19 MED ORDER — TRAMADOL HCL 50 MG PO TABS: 50.0000 mg | ORAL_TABLET | Freq: Three times a day (TID) | ORAL | Status: DC | PRN

## 2014-09-19 MED ORDER — PROMETHAZINE HCL 25 MG PO TABS: 25.0000 mg | ORAL_TABLET | Freq: Three times a day (TID) | ORAL | Status: DC | PRN

## 2014-09-19 MED ORDER — HYDROCHLOROTHIAZIDE 25 MG PO TABS: 25.0000 mg | ORAL_TABLET | Freq: Every day | ORAL | Status: DC

## 2014-09-19 NOTE — Progress Notes (Signed)
Left lower venous duplex performed  

## 2014-09-19 NOTE — Progress Notes (Signed)
Patient ID: POLA FURNO MRN: [id number], DOB: [birth year], 54 y.o. Date of Encounter: 09/19/2014, 11:49 AM    Chief Complaint:  Chief Complaint  Patient presents with  . swollen left ankle    no injury     HPI: 54 y.o. year old white female says that this left ankle started swelling this past Friday which was 09/16/14.  Her last set of injections to her back was last Monday afternoon which was 09/12/14.--Had 4 injections on each side of her back.  Says that this swelling just started occurring all of a sudden while she was at work Friday afternoon 09/16/14. Says that she had no trauma or injury. Says that she did not twist her ankle and did not fall and nothing fell on her foot etc.  Says that she did call Dr. Jodene Nam office but they said that they actually just did an epidural and did not think that what they had done that prior Monday should be causing this swelling.  Patient also states that she has had no injury to the left knee or any other area of the left lower extremity and has had no increased pain in any of those areas.     Home Meds:   Outpatient Prescriptions Prior to Visit  Medication Sig Dispense Refill  . ALPRAZolam (XANAX) 0.5 MG tablet TAKE 1 TABLET BY MOUTH 3 TIMES A DAY AS NEEDED FOR ANXIETY 90 tablet 0  . aspirin 81 MG tablet Take 81 mg by mouth daily.      Marland Kitchen atenolol (TENORMIN) 25 MG tablet TAKE 1 TABLET EVERY DAY 30 tablet 5  . calcium-vitamin D (OSCAL WITH D) 250-125 MG-UNIT per tablet Take 1 tablet by mouth daily.      . celecoxib (CELEBREX) 100 MG capsule TAKE 1 CAPSULE (100 MG TOTAL) BY MOUTH 2 (TWO) TIMES DAILY. 60 capsule 3  . cetirizine (ZYRTEC) 10 MG tablet Take 10 mg by mouth daily.    . citalopram (CELEXA) 40 MG tablet TAKE 1 TABLET BY MOUTH ONCE DAILY 30 tablet 5  . fish oil-omega-3 fatty acids 1000 MG capsule Take 2 g by mouth 2 (two) times daily.      Marland Kitchen gabapentin (NEURONTIN) 300 MG capsule Take 1 capsule by mouth 4 (four) times daily.      Marland Kitchen HYDROcodone-acetaminophen (NORCO) 10-325 MG per tablet   0  . losartan (COZAAR) 100 MG tablet Take 1 tablet (100 mg total) by mouth daily. 90 tablet 3  . meloxicam (MOBIC) 15 MG tablet     . norgestrel-ethinyl estradiol (CRYSELLE-28) 0.3-30 MG-MCG tablet Take 1 tablet by mouth daily. 28 tablet 11  . sulfamethoxazole-trimethoprim (BACTRIM DS) 800-160 MG per tablet TAKE 1 TABLET BY MOUTH ONCE AFTER INTERCOURSE AS NEEDED 30 tablet 3  . promethazine (PHENERGAN) 25 MG tablet Take 1 tablet (25 mg total) by mouth every 8 (eight) hours as needed for nausea or vomiting. 20 tablet 0  . traMADol (ULTRAM) 50 MG tablet TAKE 1 TABLET EVERY 8 HOURS AS NEEDED 30 tablet 0   No facility-administered medications prior to visit.    Allergies:  Allergies  Allergen Reactions  . Norvasc [Amlodipine Besylate] Swelling and Rash      Review of Systems: See HPI for pertinent ROS. All other ROS negative.    Physical Exam: Blood pressure 110/80, pulse 56, temperature 98.3 F (36.8 C), temperature source Oral, resp. rate 18, weight 152 lb (68.947 kg)., Body mass index is 26.08 kg/(m^2). General:  WNWD WF. Appears in  no acute distress. Neck: Supple. No thyromegaly. No lymphadenopathy. Lungs: Clear bilaterally to auscultation without wheezes, rales, or rhonchi. Breathing is unlabored. Heart: Regular rhythm. No murmurs, rubs, or gallops. Msk:  Strength and tone normal for age. Extremities/Skin: Warm and dry. Left Ankle with swelling covering approx 2 inch length of swelling--extends approx 1 inch superior to ankle joint and appro 11/2 inch distal to ankle joint.  No erythema or warmth.  There is tenderness with palpation around the lateral malleolus. No ecchymosis and no abrasion.  Neuro: Alert and oriented X 3. Moves all extremities spontaneously. Gait is normal. CNII-XII grossly in tact. Psych:  Responds to questions appropriately with a normal affect.     ASSESSMENT AND PLAN:  54 y.o. year old female  with  1. Swelling of left lower extremity Obtain lower extremity venous Doppler to rule out DVT.  We will schedule for her to go to this immediately. If this is negative then she can take HCTZ 25 daily until swelling resolves. Follow-up to if develops any other signs or symptoms. I still wonder about ankle sprain but she repeatedly says that she knows that she did not twist her ankle at all etc. - Lower Extremity Venous Duplex Left; Future - hydrochlorothiazide (HYDRODIURIL) 25 MG tablet; Take 1 tablet (25 mg total) by mouth daily.  Dispense: 30 tablet; Refill: 0   Signed, [address] [place], [place], [facility] 09/19/2014 11:49 AM

## 2014-09-20 ENCOUNTER — Telehealth: Payer: Self-pay | Admitting: Physician Assistant

## 2014-09-20 ENCOUNTER — Telehealth: Payer: Self-pay | Admitting: Family Medicine

## 2014-09-20 NOTE — Telephone Encounter (Signed)
[phone] PT left a voicemail yesterday afternoon stating that she had her test done and there was no blood clot and she was wanting to know is there anything else that she was needing to know or do?

## 2014-09-20 NOTE — Telephone Encounter (Signed)
Called patient's home phone number. Left her a voice message stating that the ultrasound test was negative for DVT/blood clot. Stated that, as discussed at office visit, usually swelling of one ankle such as this is usually secondary to localized inflammation of that joint. As discussed at the office visit, reminded her to treat this with rest and ice and follow-up if does not resolve over the next few days. At the office visit I had also prescribed HCTZ and on the message told her to take this daily for 3 days and then if the swelling is not improved/almost resolved, then follow-up. I also called patient's cell phone number and left the same voice message on that as well.

## 2014-09-22 NOTE — Telephone Encounter (Signed)
I will forward to MBD

## 2014-09-22 NOTE — Telephone Encounter (Signed)
Pt had OV and test on 09/19/14.  On 09/19/14, at around 5:00 pm, I called both her Cell Phone and her Home Phone and left message on both stating: Doppler test negative. No blood clot.  As discussed at office visit, suspect that swelling secondary to inflammation of ankle joint.  Rest the ankle as much as possible. Elevate it. Apply ice.

## 2014-09-22 NOTE — Telephone Encounter (Signed)
Please advise 

## 2014-09-23 NOTE — Telephone Encounter (Signed)
Call placed to patient and patient made aware.  

## 2014-10-03 ENCOUNTER — Other Ambulatory Visit: Payer: Self-pay | Admitting: Family Medicine

## 2014-10-08 ENCOUNTER — Other Ambulatory Visit: Payer: Self-pay | Admitting: Physician Assistant

## 2014-10-08 ENCOUNTER — Other Ambulatory Visit: Payer: Self-pay | Admitting: Family Medicine

## 2014-10-10 MED ORDER — HYDROCHLOROTHIAZIDE 25 MG PO TABS: 25.0000 mg | ORAL_TABLET | Freq: Every day | ORAL | Status: DC

## 2014-10-10 MED ORDER — ATENOLOL 25 MG PO TABS: 25.0000 mg | ORAL_TABLET | Freq: Every day | ORAL | Status: DC

## 2014-10-10 MED ORDER — CITALOPRAM HYDROBROMIDE 40 MG PO TABS: 40.0000 mg | ORAL_TABLET | Freq: Every day | ORAL | Status: DC

## 2014-10-10 MED ORDER — NORGESTREL-ETHINYL ESTRADIOL 0.3-30 MG-MCG PO TABS: 1.0000 | ORAL_TABLET | Freq: Every day | ORAL | Status: DC

## 2014-10-10 NOTE — Telephone Encounter (Signed)
Medication refilled per protocol. 

## 2014-10-10 NOTE — Addendum Note (Signed)
Addended by: Olena Mater on: 10/10/2014 04:25 PM   Modules accepted: Orders, Medications

## 2014-10-11 ENCOUNTER — Other Ambulatory Visit: Payer: Self-pay | Admitting: Family Medicine

## 2014-10-11 NOTE — Telephone Encounter (Signed)
Ok to refill??  Last office visit 09/19/2014.  Last refill 09/12/2014.

## 2014-10-11 NOTE — Telephone Encounter (Signed)
ok 

## 2014-10-12 NOTE — Telephone Encounter (Signed)
rx called in

## 2014-10-14 ENCOUNTER — Other Ambulatory Visit: Payer: Self-pay | Admitting: Family Medicine

## 2014-10-14 DIAGNOSIS — Z1231 Encounter for screening mammogram for malignant neoplasm of breast: Secondary | ICD-10-CM

## 2014-10-21 ENCOUNTER — Ambulatory Visit (HOSPITAL_COMMUNITY)
Admission: RE | Admit: 2014-10-21 | Discharge: 2014-10-21 | Disposition: A | Discharge status: Home / Self Care | Payer: BLUE CROSS/BLUE SHIELD | Source: Ambulatory Visit | Attending: Family Medicine | Admitting: Family Medicine

## 2014-10-21 DIAGNOSIS — Z1231 Encounter for screening mammogram for malignant neoplasm of breast: Secondary | ICD-10-CM | POA: Insufficient documentation

## 2014-10-25 ENCOUNTER — Other Ambulatory Visit: Payer: Self-pay | Admitting: Family Medicine

## 2014-10-25 DIAGNOSIS — R928 Other abnormal and inconclusive findings on diagnostic imaging of breast: Secondary | ICD-10-CM

## 2014-10-28 ENCOUNTER — Telehealth: Payer: Self-pay | Admitting: Family Medicine

## 2014-10-28 MED ORDER — PROMETHAZINE HCL 25 MG PO TABS: 25.0000 mg | ORAL_TABLET | Freq: Three times a day (TID) | ORAL | Status: DC | PRN

## 2014-10-28 NOTE — Telephone Encounter (Signed)
Refilled phenergan x 1.  Left pt message rx sent

## 2014-10-28 NOTE — Telephone Encounter (Signed)
[facility]   Patient would like to know if something can be called in for nausea if possible, says she has been in several times for this  [phone] if any questions

## 2014-10-31 ENCOUNTER — Other Ambulatory Visit: Payer: Self-pay | Admitting: Family Medicine

## 2014-10-31 ENCOUNTER — Ambulatory Visit
Admission: RE | Admit: 2014-10-31 | Discharge: 2014-10-31 | Disposition: A | Discharge status: Home / Self Care | Payer: BLUE CROSS/BLUE SHIELD | Source: Ambulatory Visit | Attending: Family Medicine | Admitting: Family Medicine

## 2014-10-31 ENCOUNTER — Ambulatory Visit: Payer: BLUE CROSS/BLUE SHIELD | Admitting: Physician Assistant

## 2014-10-31 ENCOUNTER — Encounter: Payer: Self-pay | Admitting: Physician Assistant

## 2014-10-31 ENCOUNTER — Ambulatory Visit (INDEPENDENT_AMBULATORY_CARE_PROVIDER_SITE_OTHER): Payer: BLUE CROSS/BLUE SHIELD | Admitting: Physician Assistant

## 2014-10-31 VITALS — BP 172/90 | HR 68 | Temp 98.3°F | Resp 18 | Wt 143.0 lb

## 2014-10-31 DIAGNOSIS — G8929 Other chronic pain: Secondary | ICD-10-CM | POA: Diagnosis not present

## 2014-10-31 DIAGNOSIS — M4300 Spondylolysis, site unspecified: Secondary | ICD-10-CM | POA: Diagnosis not present

## 2014-10-31 DIAGNOSIS — M5417 Radiculopathy, lumbosacral region: Secondary | ICD-10-CM

## 2014-10-31 DIAGNOSIS — M545 Low back pain: Secondary | ICD-10-CM

## 2014-10-31 DIAGNOSIS — G894 Chronic pain syndrome: Secondary | ICD-10-CM

## 2014-10-31 DIAGNOSIS — R928 Other abnormal and inconclusive findings on diagnostic imaging of breast: Secondary | ICD-10-CM

## 2014-10-31 DIAGNOSIS — M5136 Other intervertebral disc degeneration, lumbar region: Secondary | ICD-10-CM | POA: Diagnosis not present

## 2014-10-31 DIAGNOSIS — M5126 Other intervertebral disc displacement, lumbar region: Secondary | ICD-10-CM

## 2014-10-31 DIAGNOSIS — M5416 Radiculopathy, lumbar region: Secondary | ICD-10-CM | POA: Diagnosis not present

## 2014-10-31 NOTE — Progress Notes (Signed)
Patient ID: Erika Harvey MRN: [id number], DOB: [birth year], 54 y.o. Date of Encounter: 10/31/2014,   Chief Complaint: Back Pain  HPI: 54 y.o. y/o female  here to discuss back pain.   The following is the documentation I have regarding her back pain at this point:   At her Ov with me 01/2013  she had complaints of pain in her left thigh down the lateral aspect of her left leg.  At that point, she had just had left knee surgery that prior January. He has started noticing the pain in her left thigh after that knee surgery. Therefore orthopedics at [facility] was managing this.  At her next appt with me she told me she had  followup with Ortho. Seeing Dr. Cyndia Diver orthopedics regarding her back. [name]. States that as it turns out she has some scoliosis which she never knew she had. [name]. On the left she thinks at level L4-L5. Said that she was scheduled to followup there with an injection that upcoming Tuesday.   In the past she has also seen other doctors at [facility] including Dr. Veverly Fells and Dr. Alvan Dame who did her knee surgery. Then  saw Dr. Maxie Better for her back.   At past office visit, we discussed that she had had weight gain. Prior to these orthopedic problems she was walking on the treadmill 5 days per week for 1 mile. However, she has been unable to do any exercise secondary to these problems.  At her visit with me 01/2014, she told me that she had an appointment later that day with [facility]--That would be her first visit there at [facility].  At [facility] with me 07/2014-- she stated that she has continued to see Dr. Vira Blanco  at [facility]. She was getting epidural injections--- one injection on each side---was getting injections each month.  At that time, reported that the injections really helped, but said that the effect did wear off.  Said that she has to sit at her desk all day at work in  this is contributing to her pain. Said that she also has a long drive to her work and this is also contributing to her pain.   TODA---10/31/2014:  Today she states that she scheduled this appointment with me because she just didn't know what else to do. Says that this back pain is worse than it ever has been and she doesn't know where to go from here. Says that her current pain doctor who is Dr. Andree Elk is doing 5 shots to the left side and then will do 5 shots to the right side. Says that the next step of treatment that he plans to do--- that right now her insurance company is not wanting to cover that next stage of treatment. Also says that he has her starting with physical therapy and she just went there for the first visit today. PT plans for her to come twice a week and she was scheduled today and again this upcoming Wednesday. She doesn't know whether to proceed with physical therapy. Says in the past when she was seeing Dr. Maxie Better at [facility] she went to some physical therapy then but then stopped it because it was so time-consuming and taking time away from work etc. and she wasn't sure how much benefit she was getting.  Says that she met with people from work today because they are concerned  about her decreased productivity and decreased quality of work secondary to this pain.Also, she has been out of work for the past one week. Is still in pain and does not know when she can return to work.   She says that Dr. Maxie Better had filled out some FMLA  paperwork in the past which has been covering for time that she has needed out of work so far. Says that she also has 40 days of sick leave as well as FMLA available. Says that she has been out of work since last Tuesday and has been out of work a week now. Says that even with being out of work she still is in severe pain and doesn't know what she is going to do.  She says that Dr. Andree Elk is in the same group as Dr.Spivey. Says that she was  seeing Dr.Spivey but then he was moved back to their [place] office so she has been seeing Dr. Andree Elk since.  Says that regarding this back pain she originally saw Dr. Cyndia Diver orthopedics and they were giving one injection every 6 months. Went to physical therapy there but then quit doing that. They then sent her to Dr. Vira Blanco and Dr. Andree Elk.  Past Medical History  Diagnosis Date  . Hypertension   . Anxiety   . Frequent UTI   . Chronic low back pain      Past Surgical History  Procedure Laterality Date  . Knee surgery      left  . Wisdom tooth extraction    . Knee surgery Left 07/08/2012    Home Meds:  Outpatient Prescriptions Prior to Visit  Medication Sig Dispense Refill  . ALPRAZolam (XANAX) 0.5 MG tablet TAKE 1 TABLET BY MOUTH 3 TIMES DAILY AS NEEDED 90 tablet 0  . aspirin 81 MG tablet Take 81 mg by mouth daily.      Marland Kitchen atenolol (TENORMIN) 25 MG tablet Take 1 tablet (25 mg total) by mouth daily. 30 tablet 3  . calcium-vitamin D (OSCAL WITH D) 250-125 MG-UNIT per tablet Take 1 tablet by mouth daily.      . celecoxib (CELEBREX) 100 MG capsule TAKE 1 CAPSULE (100 MG TOTAL) BY MOUTH 2 (TWO) TIMES DAILY. 60 capsule 3  . cetirizine (ZYRTEC) 10 MG tablet Take 10 mg by mouth daily.    . citalopram (CELEXA) 40 MG tablet Take 1 tablet (40 mg total) by mouth daily. 30 tablet 3  . fish oil-omega-3 fatty acids 1000 MG capsule Take 2 g by mouth 2 (two) times daily.      Marland Kitchen gabapentin (NEURONTIN) 300 MG capsule Take 1 capsule by mouth 4 (four) times daily.     . hydrochlorothiazide (HYDRODIURIL) 25 MG tablet Take 1 tablet (25 mg total) by mouth daily. 30 tablet 3  . HYDROcodone-acetaminophen (NORCO) 10-325 MG per tablet   0  . losartan (COZAAR) 100 MG tablet Take 1 tablet (100 mg total) by mouth daily. 90 tablet 3  . norgestrel-ethinyl estradiol (CRYSELLE-28) 0.3-30 MG-MCG tablet Take 1 tablet by mouth daily. 28 tablet 3  . promethazine (PHENERGAN) 25 MG tablet Take 1 tablet (25 mg  total) by mouth every 8 (eight) hours as needed for nausea or vomiting. 20 tablet 0  . sulfamethoxazole-trimethoprim (BACTRIM DS,SEPTRA DS) 800-160 MG per tablet TAKE 1 TABLET BY MOUTH ONCE AFTER INTERCOURSE AS NEEDED 30 tablet 3  . tiZANidine (ZANAFLEX) 2 MG tablet Take 2 mg by mouth 2 (two) times daily as needed.  2  . meloxicam (  MOBIC) 15 MG tablet     . traMADol (ULTRAM) 50 MG tablet Take 1 tablet (50 mg total) by mouth every 8 (eight) hours as needed. (Patient not taking: Reported on 10/31/2014) 30 tablet 0   No facility-administered medications prior to visit.    Allergies:  Allergies  Allergen Reactions  . Norvasc [Amlodipine Besylate] Swelling and Rash    History   Social History  . Marital Status: Married    Spouse Name: N/A  . Number of Children: N/A  . Years of Education: N/A   Occupational History  . Not on file.   Social History Main Topics  . Smoking status: Never Smoker   . Smokeless tobacco: Never Used  . Alcohol Use: 14.4 oz/week    24 Cans of beer per week  . Drug Use: No  . Sexual Activity: Not on file   Other Topics Concern  . Not on file   Social History Narrative    Family History  Problem Relation Age of Onset  . Cancer Mother 28    Breast Cancer  . Heart disease Father 90  . Diabetes Father     Physical Exam: Blood pressure 172/90, pulse 68, temperature 98.3 F (36.8 C), temperature source Oral, resp. rate 18, weight 143 lb (64.864 kg)., Body mass index is 24.53 kg/(m^2). General: Well developed, well nourished, WF. Appears in no acute distress. Lungs: Clear to auscultation bilaterally without wheezes, rales, or rhonchi. Breathing is of normal effort and unlabored. Cardiovascular: RRR with S1 S2. No murmurs, rubs, or gallops. Distal pulses 2+ symmetrically. No carotid or abdominal bruits. Musculoskeletal: Full range of motion and 5/5 strength throughout. Skin: Warm and moist without erythema, ecchymosis, wounds, or rash. Neuro: A+Ox3. CN  II-XII grossly intact. Moves all extremities spontaneously.  Psych:  Responds to questions appropriately with a normal affect.   Assessment/Plan:  54 y.o. y/o female here for    Chronic low back pain Lumbar herniated disc  Chronic pain syndrome  DDD (degenerative disc disease), lumbar  Spondylolysis  Lumbosacral radiculitis  Today I have given her a note to be out of work through Friday, May 6. Today I have had her sign a release form to get copy of MRI report performed at [facility] orthopedic. Of her lumbar spine. Today I have had her sign a release form to get records from Dr. Andree Elk.  She will be talking to human resources regarding FMLA paperwork so that we can determine how long she needs to be out of work and what we need to do about her work schedule. I will be getting the above records and trying to help her decide where to go from here regarding treatment as well as what to do about her work situation. She is very frustrated and also very concerned. Says that both her and her husband's insurance is through her job. Also, says that with this job she has been making the most money she ever has -- is concerned about insurance, income---  and is concerned about losing her job.    For other Dx, See Note 07/2014: 8. Generalized OA On Celebrex for this in addition to the treatment by the pain clinic.  9. Anxiety This is controlled with current medication.  10. Need for prophylactic vaccination and inoculation against influenza She is agreeable to receive influenza vaccine today. - Flu Vaccine QUAD 36+ mos PF IM (Fluarix Quad PF)   The following is copied from her CPE---01/2014:  1. Visit for preventive health examination  A. Screening Labs: She is fasting. - CBC with Differential - COMPLETE METABOLIC PANEL WITH GFR - Lipid panel - TSH  B. Pap: Last Pap smear was done by me 05/08/2010. Normal.  PAP, Thin Prep w/HPV rflx HPV Type 16/18   C. Screening Mammogram: Her  mammogram is up to date. This was last performed 08/09/2013.  D. DEXA/BMD:  Can wait to discuss closer to age 45  E. Colorectal Cancer Screening: She has not had screening colonoscopy in the past but now is agreeable for me to go ahead and schedule referral for this.  Ambulatory referral to Gastroenterology  F. Immunizations:  Influenza: N/A Tetanus: She has not had a tetanus in the last 10 years. She is agreeable to update this today. Pneumococcal: No indication to give this until age 73 Zostavax: Discussed this at age 16    2. Family History Of Breast Cancer Mother was diagnosed with breast cancer at age 35 y/o. Died at age 45. Mammogram is up to date. Last was performed 08/09/2013. Breast exam performed today.  Follow-up office visit 2 weeks to recheck blood pressure and lab work at that time.  Erika Harvey [place], [place], [facility] 10/31/2014 5:29 PM

## 2014-10-31 NOTE — Telephone Encounter (Signed)
Approved.  

## 2014-11-01 ENCOUNTER — Telehealth: Payer: Self-pay | Admitting: Family Medicine

## 2014-11-01 NOTE — Telephone Encounter (Signed)
Patient has called another spine center it is dr. Patrice [facility] [phone] her appointment is May 19,2016. She said that if you didn't want her to keep this appointment that she would cancel. She wanted to make sure you were aware of the appointment.

## 2014-11-02 ENCOUNTER — Encounter: Payer: Self-pay | Admitting: Physician Assistant

## 2014-11-02 NOTE — Telephone Encounter (Signed)
I Called patient this morning. Discussed options- Discussed possibly going back to [facility] orthopedics Dr. Micah Noel did not think that this would be of any benefit. She said that he had done the injections that he recommended and then he is the one that recommended her follow-up with this current pain center. Regarding Dr. Andree Elk at the current pain center--she says that she has called his office this morning but has not yet heard back from them. She says that if nothing else, she has an appointment to follow-up with him Tuesday which is May 3. Regarding seeing Dr. Hart Carwin agreed with seeing him to get another opinion. I recommended that she come here and get a copy of her records that I have just received to take to Dr. Patrice Paradise so that she can get the most out of that visit. I have received a copy of the lumbar spine MRI performed at [facility] 07/16/13. I have also received copies of notes from Dr. Andree Elk at the pain center. In the meantime, she says that she is having significant pain currently. She went to physical therapy Monday 10/31/14 and was scheduled to go back to physical therapy today Wednesday 4/27. Says that yesterday/Tuesday she was doing the exercises at home that physical therapy recommended she do. She says that the pain was even worse and her husband recognized this and told her to stop this. --She will come here and pick up copy of records to have. --She will keep appointment with Dr. Andree Elk for next Tuesday. She will keep appointment to see Dr. Patrice Paradise. She will be getting in contact with her HR department regarding FMLA etc. and then follow-up with me. I mentioned that she may even have to look into Disability, depending on treatment options that are available (or, not available)

## 2014-11-10 ENCOUNTER — Encounter: Payer: Self-pay | Admitting: Physician Assistant

## 2014-11-10 ENCOUNTER — Encounter: Payer: Self-pay | Admitting: Family Medicine

## 2014-11-10 ENCOUNTER — Ambulatory Visit (INDEPENDENT_AMBULATORY_CARE_PROVIDER_SITE_OTHER): Payer: BLUE CROSS/BLUE SHIELD | Admitting: Physician Assistant

## 2014-11-10 VITALS — BP 132/70 | HR 72 | Temp 98.2°F | Resp 18 | Wt 143.0 lb

## 2014-11-10 DIAGNOSIS — M5417 Radiculopathy, lumbosacral region: Secondary | ICD-10-CM

## 2014-11-10 DIAGNOSIS — G8929 Other chronic pain: Secondary | ICD-10-CM | POA: Diagnosis not present

## 2014-11-10 DIAGNOSIS — M4300 Spondylolysis, site unspecified: Secondary | ICD-10-CM

## 2014-11-10 DIAGNOSIS — M545 Low back pain, unspecified: Secondary | ICD-10-CM

## 2014-11-10 DIAGNOSIS — M5126 Other intervertebral disc displacement, lumbar region: Secondary | ICD-10-CM | POA: Diagnosis not present

## 2014-11-10 DIAGNOSIS — K219 Gastro-esophageal reflux disease without esophagitis: Secondary | ICD-10-CM | POA: Insufficient documentation

## 2014-11-10 DIAGNOSIS — M5416 Radiculopathy, lumbar region: Secondary | ICD-10-CM

## 2014-11-10 DIAGNOSIS — M5136 Other intervertebral disc degeneration, lumbar region: Secondary | ICD-10-CM | POA: Diagnosis not present

## 2014-11-10 DIAGNOSIS — F419 Anxiety disorder, unspecified: Secondary | ICD-10-CM | POA: Diagnosis not present

## 2014-11-10 MED ORDER — ESOMEPRAZOLE MAGNESIUM 40 MG PO CPDR: 40.0000 mg | DELAYED_RELEASE_CAPSULE | Freq: Every day | ORAL | Status: DC

## 2014-11-10 MED ORDER — ALPRAZOLAM 0.5 MG PO TABS: 0.5000 mg | ORAL_TABLET | Freq: Three times a day (TID) | ORAL | Status: DC | PRN

## 2014-11-10 NOTE — Progress Notes (Signed)
Patient ID: Erika Harvey MRN: [id number], DOB: 1960-10-11, 54 y.o. Date of Encounter: 11/10/2014,   Chief Complaint: Back Pain  HPI: 54 y.o. y/o female  here to discuss back pain.   THE FOLLOWING IS COPIED FROM HER OV NOTE 10/31/2014:  The following is the documentation I have regarding her back pain at this point:   At her Ov with me 01/2013  she had complaints of pain in her left thigh down the lateral aspect of her left leg.  At that point, she had just had left knee surgery that prior January. He has started noticing the pain in her left thigh after that knee surgery. Therefore orthopedics at [facility] was managing this.  At her next appt with me she told me she had  followup with Ortho. Seeing Dr. Cyndia Diver orthopedics regarding her back. [name]. States that as it turns out she has some scoliosis which she never knew she had. [name]. On the left she thinks at level L4-L5. Said that she was scheduled to followup there with an injection that upcoming Tuesday.   In the past she has also seen other doctors at [facility] including Dr. Veverly Fells and Dr. Alvan Dame who did her knee surgery. Then  saw Dr. Maxie Better for her back.   At past office visit, we discussed that she had had weight gain. Prior to these orthopedic problems she was walking on the treadmill 5 days per week for 1 mile. However, she has been unable to do any exercise secondary to these problems.  At her visit with me 01/2014, she told me that she had an appointment later that day with Dr[facility]--That would be her first visit there at pain management.  At [place] with me 07/2014-- she stated that she has continued to see Dr. Vira Blanco  at pain management. She was getting epidural injections--- one injection on each side---was getting injections each month.  At that time, reported that the injections really helped, but said that the effect did wear  off.  Said that she has to sit at her desk all day at work in this is contributing to her pain. Said that she also has a long drive to her work and this is also contributing to her pain.   AT OV --10/31/2014:  Today she states that she scheduled this appointment with me because she just didn't know what else to do. Says that this back pain is worse than it ever has been and she doesn't know where to go from here. Says that her current pain doctor who is Dr. Andree Elk is doing 5 shots to the left side and then will do 5 shots to the right side. Says that the next step of treatment that he plans to do--- that right now her insurance company is not wanting to cover that next stage of treatment. Also says that he has her starting with physical therapy and she just went there for the first visit today. PT plans for her to come twice a week and she was scheduled today and again this upcoming Wednesday. She doesn't know whether to proceed with physical therapy. Says in the past when she was seeing Dr. Maxie Better at [facility] she went to some physical therapy then but then stopped it because it was so time-consuming and taking time away from work etc. and she wasn't sure how much benefit she was getting.  Says  that she met with people from work today because they are concerned about her decreased productivity and decreased quality of work secondary to this pain.Also, she has been out of work for the past one week. Is still in pain and does not know when she can return to work.   She says that Dr. Maxie Better had filled out some FMLA  paperwork in the past which has been covering for time that she has needed out of work so far. Says that she also has 40 days of sick leave as well as FMLA available. Says that she has been out of work since last Tuesday and has been out of work a week now. Says that even with being out of work she still is in severe pain and doesn't know what she is going to do.  She says that  Dr. Andree Elk is in the same group as Dr.Spivey. Says that she was seeing Dr.Spivey but then he was moved back to their [place] office so she has been seeing Dr. Andree Elk since.  Says that regarding this back pain she originally saw Dr. Cyndia Diver orthopedics and they were giving one injection every 6 months. Went to physical therapy there but then quit doing that. They then sent her to Dr. Vira Blanco and Dr. Andree Elk.  Today I have given her a note to be out of work through Friday, May 6. Today I have had her sign a release form to get copy of MRI report performed at [facility] orthopedic. Of her lumbar spine. Today I have had her sign a release form to get records from Dr. Andree Elk.  She will be talking to human resources regarding FMLA paperwork so that we can determine how long she needs to be out of work and what we need to do about her work schedule. I will be getting the above records and trying to help her decide where to go from here regarding treatment as well as what to do about her work situation. She is very frustrated and also very concerned. Says that both her and her husband's insurance is through her job. Also, says that with this job she has been making the most money she ever has -- is concerned about insurance, income---  and is concerned about losing her job.   THE FOLLOWING IS COPIED FROM PHONE NOTE 11/01/14:   [name], PA-C at 11/02/2014 1:40 PM     Status: Signed       Expand All Collapse All   I Called patient this morning. Discussed options- Discussed possibly going back to [facility] orthopedics Dr. Micah Noel did not think that this would be of any benefit. She said that he had done the injections that he recommended and then he is the one that recommended her follow-up with this current pain center. Regarding Dr. Andree Elk at the current pain center--she says that she has called his office this morning but has not yet heard back from them. She says that if nothing else,  she has an appointment to follow-up with him Tuesday which is May 3. Regarding seeing Dr. Hart Carwin agreed with seeing him to get another opinion. I recommended that she come here and get a copy of her records that I have just received to take to Dr. Patrice Paradise so that she can get the most out of that visit. I have received a copy of the lumbar spine MRI performed at [facility] 07/16/13. I have also received copies of notes from Dr. Andree Elk at the pain  center. In the meantime, she says that she is having significant pain currently. She went to physical therapy Monday 10/31/14 and was scheduled to go back to physical therapy today Wednesday 4/27. Says that yesterday/Tuesday she was doing the exercises at home that physical therapy recommended she do. She says that the pain was even worse and her husband recognized this and told her to stop this. --She will come here and pick up copy of records to have. --She will keep appointment with Dr. Andree Elk for next Tuesday. She will keep appointment to see Dr. Patrice Paradise. She will be getting in contact with her HR department regarding FMLA etc. and then follow-up with me. I mentioned that she may even have to look into Disability, depending on treatment options that are available (or, not available)             Roney Marion at 11/01/2014 11:18 AM     Status: Signed       Expand All Collapse All   Patient has called another spine center it is dr. Patrice Paradise [facility] [phone] her appointment is May 19,2016. She said that if you didn't want her to keep this appointment that she would cancel. She wanted to make sure you were aware of the appointment.       TODAY---11/10/2014: Today patient states that in the past she had had injections to the left facet. States that when she saw Dr. Andree Elk this past Tuesday, May 3 he did right facet injection. She says that after that injection the pain was much better and that was the best she had felt in a  long time. However, she subsequently went to physical therapy yesterday which was Wednesday, May 4. Says that when she left there the pain returned. Says at physical therapy she did 5 minutes on the bike and then an exercise where she is standing up and pulling on resistance bands with her hands and arms. Then did some other exercise involving lifting her legs. Then applied TENS unit to the area. Says that when she left there after sitting in her car for just a few minutes, the pain returned. She says that when she had talked to Dr. Andree Elk that he told her not to just not stop showing up for physical therapy because this would show up on her record if she tries to go to pain clinic later etc. However, she says that when she went to physical therapy-- she told them that she was having significant pain with her exercises and that they needed to modify things but she says that the therapy they did still cause severe pain again.  She says that she did have discussion with Dr. Andree Elk at her appointment there Tuesday, May 3. She says that he really did not discuss any other options regarding treatment other than what his current treatment plan is. He did recommend for her to go to physical therapy but just talked to them about modifying things so that it would not cause such pain but that she needed to the compliance with follow-up there. Not wanted on her record that she "did not show " Her appointment with Dr. Patrice Paradise is May 19. She does plan to still go see him to get his opinion. However, today she is in much better spirits regarding the fact that she did get pain relief with this last set of facet injections with Dr. Andree Elk.  Also at this point she feels that she should just plan to  return to work Monday. Says that she has come to the realization that she will just have to stand up some during the day regardless of getting her work done. Is that she does have short-term and long-term disability options but does  not want to use these at this time. Says that in general after taking this time off from work etc. she does feel a lot better in general. Says that she is not feeling as nauseous and feels that she has gotten her appetite back some compared to the way she had been feeling.   Past Medical History  Diagnosis Date  . Hypertension   . Anxiety   . Frequent UTI   . Chronic low back pain      Past Surgical History  Procedure Laterality Date  . Knee surgery      left  . Wisdom tooth extraction    . Knee surgery Left 07/08/2012    Home Meds:  Outpatient Prescriptions Prior to Visit  Medication Sig Dispense Refill  . aspirin 81 MG tablet Take 81 mg by mouth daily.      Marland Kitchen atenolol (TENORMIN) 25 MG tablet Take 1 tablet (25 mg total) by mouth daily. 30 tablet 3  . calcium-vitamin D (OSCAL WITH D) 250-125 MG-UNIT per tablet Take 1 tablet by mouth daily.      . celecoxib (CELEBREX) 100 MG capsule TAKE 1 CAPSULE (100 MG TOTAL) BY MOUTH 2 (TWO) TIMES DAILY. 60 capsule 3  . cetirizine (ZYRTEC) 10 MG tablet Take 10 mg by mouth daily.    . citalopram (CELEXA) 40 MG tablet Take 1 tablet (40 mg total) by mouth daily. 30 tablet 3  . fish oil-omega-3 fatty acids 1000 MG capsule Take 2 g by mouth 2 (two) times daily.      Marland Kitchen gabapentin (NEURONTIN) 300 MG capsule Take 1 capsule by mouth 4 (four) times daily.     . hydrochlorothiazide (HYDRODIURIL) 25 MG tablet Take 1 tablet (25 mg total) by mouth daily. 30 tablet 3  . HYDROcodone-acetaminophen (NORCO) 10-325 MG per tablet   0  . losartan (COZAAR) 100 MG tablet Take 1 tablet (100 mg total) by mouth daily. 90 tablet 3  . meloxicam (MOBIC) 15 MG tablet     . norgestrel-ethinyl estradiol (CRYSELLE-28) 0.3-30 MG-MCG tablet Take 1 tablet by mouth daily. 28 tablet 3  . promethazine (PHENERGAN) 25 MG tablet Take 1 tablet (25 mg total) by mouth every 8 (eight) hours as needed for nausea or vomiting. 20 tablet 0  . sulfamethoxazole-trimethoprim (BACTRIM DS,SEPTRA DS)  800-160 MG per tablet TAKE 1 TABLET BY MOUTH ONCE AFTER INTERCOURSE AS NEEDED 30 tablet 3  . tiZANidine (ZANAFLEX) 2 MG tablet Take 2 mg by mouth 2 (two) times daily as needed.  2  . traMADol (ULTRAM) 50 MG tablet Take 1 tablet (50 mg total) by mouth every 8 (eight) hours as needed. 30 tablet 0  . ALPRAZolam (XANAX) 0.5 MG tablet TAKE 1 TABLET BY MOUTH 3 TIMES DAILY AS NEEDED 90 tablet 0   No facility-administered medications prior to visit.    Allergies:  Allergies  Allergen Reactions  . Norvasc [Amlodipine Besylate] Swelling and Rash    History   Social History  . Marital Status: Married    Spouse Name: N/A  . Number of Children: N/A  . Years of Education: N/A   Occupational History  . Not on file.   Social History Main Topics  . Smoking status: Never Smoker   .  Smokeless tobacco: Never Used  . Alcohol Use: 14.4 oz/week    24 Cans of beer per week  . Drug Use: No  . Sexual Activity: Not on file   Other Topics Concern  . Not on file   Social History Narrative    Family History  Problem Relation Age of Onset  . Cancer Mother 56    Breast Cancer  . Heart disease Father 42  . Diabetes Father     Physical Exam: Blood pressure 132/70, pulse 72, temperature 98.2 F (36.8 C), temperature source Oral, resp. rate 18, weight 143 lb (64.864 kg)., Body mass index is 24.53 kg/(m^2). General: Well developed, well nourished, WF. Appears in no acute distress. Lungs: Clear to auscultation bilaterally without wheezes, rales, or rhonchi. Breathing is of normal effort and unlabored. Cardiovascular: RRR with S1 S2. No murmurs, rubs, or gallops. Distal pulses 2+ symmetrically. No carotid or abdominal bruits. Musculoskeletal: Full range of motion and 5/5 strength throughout. Skin: Warm and moist without erythema, ecchymosis, wounds, or rash. Neuro: A+Ox3. CN II-XII grossly intact. Moves all extremities spontaneously.  Psych:  Responds to questions appropriately with a normal affect.    Assessment/Plan:  54 y.o. y/o female here for  1.Anxiety Today she is requesting refill on her alprazolam. - ALPRAZolam (XANAX) 0.5 MG tablet; Take 1 tablet (0.5 mg total) by mouth 3 (three) times daily as needed.  Dispense: 90 tablet; Refill: 0  2 Gastroesophageal reflux disease, esophagitis presence not specified She says that she has been using over-the-counter Nexium and this has really helped her nausea and anorexia and no symptoms. I will send refill is this may be cheaper to get prescription. - esomeprazole (NEXIUM) 40 MG capsule; Take 1 capsule (40 mg total) by mouth daily.  Dispense: 30 capsule; Refill: 11     3. Chronic low back pain Lumbar herniated disc  Chronic pain syndrome  DDD (degenerative disc disease), lumbar  Spondylolysis  Lumbosacral radiculitis  At last visit we gave her a note to be out of work through Friday, May 6. Today I've given her a letter that states that she can return to work Monday 11/14/14.  She will follow-up with Dr. Andree Elk and physical therapy as scheduled and will follow-up with Dr. Patrice Paradise as well.     For other Dx, See Note 07/2014: 8. Generalized OA On Celebrex for this in addition to the treatment by the pain clinic.  9. Anxiety This is controlled with current medication.   The following is copied from her CPE---01/2014:  1. Visit for preventive health examination  A. Screening Labs: She is fasting. - CBC with Differential - COMPLETE METABOLIC PANEL WITH GFR - Lipid panel - TSH  B. Pap: Last Pap smear was done by me 05/08/2010. Normal.  PAP, Thin Prep w/HPV rflx HPV Type 16/18   C. Screening Mammogram: Her mammogram is up to date. This was last performed 08/09/2013.  D. DEXA/BMD:  Can wait to discuss closer to age 89  E. Colorectal Cancer Screening: She has not had screening colonoscopy in the past but now is agreeable for me to go ahead and schedule referral for this.  Ambulatory referral to Gastroenterology  F.  Immunizations:  Influenza: N/A Tetanus: She has not had a tetanus in the last 10 years. She is agreeable to update this today. Pneumococcal: No indication to give this until age 80 Zostavax: Discussed this at age 35    2. Family History Of Breast Cancer Mother was diagnosed with breast cancer at  age 72 y/o. Died at age 40. Mammogram is up to date. Last was performed 08/09/2013. Breast exam performed today.  Follow-up office visit 2 weeks to recheck blood pressure and lab work at that time.  Signed, [address] [place], [place], BSFM 11/10/2014 1:28 PM

## 2014-11-13 ENCOUNTER — Other Ambulatory Visit: Payer: Self-pay | Admitting: Family Medicine

## 2014-11-15 ENCOUNTER — Other Ambulatory Visit: Payer: Self-pay | Admitting: Family Medicine

## 2014-11-15 NOTE — Telephone Encounter (Signed)
Refill appropriate and filled per protocol. 

## 2014-11-17 ENCOUNTER — Other Ambulatory Visit: Payer: Self-pay | Admitting: Family Medicine

## 2014-11-17 DIAGNOSIS — G4701 Insomnia due to medical condition: Secondary | ICD-10-CM

## 2014-11-17 DIAGNOSIS — G8929 Other chronic pain: Principal | ICD-10-CM

## 2014-11-17 MED ORDER — ZOLPIDEM TARTRATE 10 MG PO TABS: 10.0000 mg | ORAL_TABLET | Freq: Every evening | ORAL | Status: DC | PRN

## 2014-11-17 NOTE — Telephone Encounter (Signed)
Refill per provider request

## 2014-11-22 ENCOUNTER — Telehealth: Payer: Self-pay | Admitting: Family Medicine

## 2014-11-22 NOTE — Telephone Encounter (Signed)
[phone] PT called and left a VM stating that she went and saw her pain mgmt dr today and he saw how swollen her knee to her ankle was and was wanting her to call and let mary beth know

## 2014-12-06 ENCOUNTER — Telehealth: Payer: Self-pay | Admitting: Family Medicine

## 2014-12-06 MED ORDER — PROMETHAZINE HCL 25 MG PO TABS: 25.0000 mg | ORAL_TABLET | Freq: Three times a day (TID) | ORAL | Status: DC | PRN

## 2014-12-06 NOTE — Telephone Encounter (Signed)
Patient calling to see if anything can be called in for some nausea she is having [phone]

## 2014-12-06 NOTE — Telephone Encounter (Signed)
Medication called/sent to requested pharmacy and pt aware 

## 2014-12-06 NOTE — Telephone Encounter (Signed)
Phenergan 25 mg po q 6 hrs prn (10) NTBS if worse.

## 2014-12-08 ENCOUNTER — Other Ambulatory Visit: Payer: Self-pay | Admitting: Physician Assistant

## 2014-12-08 NOTE — Telephone Encounter (Signed)
Medication called to pharmacy. 

## 2014-12-08 NOTE — Telephone Encounter (Signed)
ok 

## 2014-12-08 NOTE — Telephone Encounter (Signed)
Ok to refill??  Last office visit/ refill 11/10/2014.

## 2014-12-15 ENCOUNTER — Telehealth: Payer: Self-pay | Admitting: Family Medicine

## 2014-12-15 NOTE — Telephone Encounter (Signed)
To MBD. 

## 2014-12-15 NOTE — Telephone Encounter (Signed)
Patient left message on voicemail that [facility]bs told her that she can get the shingles shot at [facility] or [facility], so she will need a prescription before they will give her the shot [phone]

## 2014-12-16 MED ORDER — ZOSTER VACCINE LIVE 19400 UNT/0.65ML ~~LOC~~ SOLR: 0.6500 mL | Freq: Once | SUBCUTANEOUS | Status: DC

## 2014-12-16 NOTE — Telephone Encounter (Signed)
Med sent to [facility] and pt aware

## 2014-12-22 ENCOUNTER — Telehealth: Payer: Self-pay | Admitting: *Deleted

## 2014-12-22 ENCOUNTER — Encounter: Payer: Self-pay | Admitting: *Deleted

## 2014-12-22 NOTE — Telephone Encounter (Signed)
Work note printed and pt aware is ready

## 2014-12-22 NOTE — Telephone Encounter (Signed)
Pt called stating that she has been having back pain and not able to work this week and wanting to know if you could write her a work note/excuse for being out of work she would like for it begin on Mon June 13 and return on June  22.   Please advise!

## 2014-12-22 NOTE — Telephone Encounter (Signed)
Yes. That is fine. Approved to write letter for out of work those dates.

## 2014-12-29 ENCOUNTER — Telehealth: Payer: Self-pay | Admitting: Family Medicine

## 2014-12-29 ENCOUNTER — Encounter: Payer: Self-pay | Admitting: Family Medicine

## 2014-12-29 NOTE — Telephone Encounter (Signed)
Approved. May give letter to cover dates she needs covered.

## 2014-12-29 NOTE — Telephone Encounter (Signed)
Patient would like to extend her note to be out the rest of this week if possible for her illness [phone] if any questions

## 2014-12-29 NOTE — Telephone Encounter (Signed)
Work note written, lmtcb

## 2015-01-04 ENCOUNTER — Other Ambulatory Visit: Payer: Self-pay | Admitting: Family Medicine

## 2015-01-04 NOTE — Telephone Encounter (Signed)
Approved. # [id number] 2. 

## 2015-01-04 NOTE — Telephone Encounter (Signed)
?   OK to Refill  - LR - 12/08/14

## 2015-01-05 NOTE — Telephone Encounter (Signed)
Medication refilled per protocol. 

## 2015-01-12 ENCOUNTER — Telehealth: Payer: Self-pay | Admitting: Family Medicine

## 2015-01-12 NOTE — Telephone Encounter (Signed)
I have not seen patient since 10/14.  I cannot fill out FMLA without an ov within a year.

## 2015-01-12 NOTE — Telephone Encounter (Signed)
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 Dennard Schaumann and placed in blue folder

## 2015-01-12 NOTE — Telephone Encounter (Signed)
Pt dropped of FMLA ppw and it has been routed to [place]

## 2015-01-16 NOTE — Telephone Encounter (Signed)
Contacted pt and advised her needs appointment before can fill out ppw, appt has been made and ppw on my desk until appt next week.

## 2015-01-26 ENCOUNTER — Telehealth: Payer: Self-pay | Admitting: *Deleted

## 2015-01-26 ENCOUNTER — Ambulatory Visit (INDEPENDENT_AMBULATORY_CARE_PROVIDER_SITE_OTHER): Payer: BLUE CROSS/BLUE SHIELD | Admitting: Physician Assistant

## 2015-01-26 ENCOUNTER — Encounter: Payer: Self-pay | Admitting: Family Medicine

## 2015-01-26 VITALS — BP 162/84 | HR 68 | Temp 98.8°F | Resp 16 | Ht 64.0 in | Wt 141.0 lb

## 2015-01-26 DIAGNOSIS — M5417 Radiculopathy, lumbosacral region: Secondary | ICD-10-CM

## 2015-01-26 DIAGNOSIS — M159 Polyosteoarthritis, unspecified: Secondary | ICD-10-CM

## 2015-01-26 DIAGNOSIS — M5416 Radiculopathy, lumbar region: Secondary | ICD-10-CM

## 2015-01-26 DIAGNOSIS — M5126 Other intervertebral disc displacement, lumbar region: Secondary | ICD-10-CM | POA: Diagnosis not present

## 2015-01-26 DIAGNOSIS — M545 Low back pain: Secondary | ICD-10-CM

## 2015-01-26 DIAGNOSIS — G8929 Other chronic pain: Secondary | ICD-10-CM

## 2015-01-26 DIAGNOSIS — M5136 Other intervertebral disc degeneration, lumbar region: Secondary | ICD-10-CM

## 2015-01-26 DIAGNOSIS — M4300 Spondylolysis, site unspecified: Secondary | ICD-10-CM

## 2015-01-26 DIAGNOSIS — G894 Chronic pain syndrome: Secondary | ICD-10-CM | POA: Diagnosis not present

## 2015-01-26 NOTE — Progress Notes (Signed)
Patient ID: Erika Harvey MRN: [id number], DOB: 08-28-1960, 54 y.o. Date of Encounter: 01/26/2015,   Chief Complaint: Back Pain  HPI: 54 y.o. y/o female  here to discuss back pain.   THE FOLLOWING IS COPIED FROM HER OV NOTE 10/31/2014:  The following is the documentation I have regarding her back pain at this point:   At her Ov with me 01/2013  she had complaints of pain in her left thigh down the lateral aspect of her left leg.  At that point, she had just had left knee surgery that prior January. He has started noticing the pain in her left thigh after that knee surgery. Therefore orthopedics at [facility] was managing this.  At her next appt with me she told me she had  followup with Ortho. Seeing Dr. Pricilla Harvey orthopedics regarding her back. York Spaniel that she had an MRI. States that as it turns out she has some scoliosis which she never knew she had. York Spaniel it also showed a problem area. On the left she thinks at level L4-L5. Said that she was scheduled to followup there with an injection that upcoming Tuesday.   In the past she has also seen other doctors at [facility] orthopedics including Dr. Ranell Patrick and Dr. Charlann Boxer who did her knee surgery. Then  saw Dr. Jillyn Hidden for her back.   At past office visit, we discussed that she had had weight gain. Prior to these orthopedic problems she was walking on the treadmill 5 days per week for 1 mile. However, she has been unable to do any exercise secondary to these problems.  At her visit with me 01/2014, she told me that she had an appointment later that day with Dr. Jordan Likes at [facility]--That would be her first visit there at pain management.  At OV with me 07/2014-- she stated that she has continued to see Dr. Jordan Likes  at pain management. She was getting epidural injections--- one injection on each side---was getting injections each month.  At that time, reported that the injections really helped, but said that the effect did wear  off.  Said that she has to sit at her desk all day at work in this is contributing to her pain. Said that she also has a long drive to her work and this is also contributing to her pain.   AT OV --10/31/2014:  Today she states that she scheduled this appointment with me because she just didn't know what else to do. Says that this back pain is worse than it ever has been and she doesn't know where to go from here. Says that her current pain doctor who is Dr. Pernell Dupre is doing 5 shots to the left side and then will do 5 shots to the right side. Says that the next step of treatment that he plans to do--- that right now her insurance company is not wanting to cover that next stage of treatment. Also says that he has her starting with physical therapy and she just went there for the first visit today. PT plans for her to come twice a week and she was scheduled today and again this upcoming Wednesday. She doesn't know whether to proceed with physical therapy. Says in the past when she was seeing Dr. Jillyn Hidden at [facility] orthopedics she went to some physical therapy then but then stopped it because it was so time-consuming and taking time away from work etc. and she wasn't sure how much benefit she was getting.  Says  that she met with people from work today because they are concerned about her decreased productivity and decreased quality of work secondary to this pain.Also, she has been out of work for the past one week. Is still in pain and does not know when she can return to work.   She says that Dr. Maxie Better had filled out some FMLA  paperwork in the past which has been covering for time that she has needed out of work so far. Says that she also has 40 days of sick leave as well as FMLA available. Says that she has been out of work since last Tuesday and has been out of work a week now. Says that even with being out of work she still is in severe pain and doesn't know what she is going to do.  She says that  Dr. Andree Elk is in the same group as Dr.Spivey. Says that she was seeing Dr.Spivey but then he was moved back to their [place] office so she has been seeing Dr. Andree Elk since.  Says that regarding this back pain she originally saw Dr. Cyndia Diver orthopedics and they were giving one injection every 6 months. Went to physical therapy there but then quit doing that. They then sent her to Dr. Vira Blanco and Dr. Andree Elk.  Today I have given her a note to be out of work through Friday, May 6. Today I have had her sign a release form to get copy of MRI report performed at [facility] orthopedic. Of her lumbar spine. Today I have had her sign a release form to get records from Dr. Andree Elk.  She will be talking to human resources regarding FMLA paperwork so that we can determine how long she needs to be out of work and what we need to do about her work schedule. I will be getting the above records and trying to help her decide where to go from here regarding treatment as well as what to do about her work situation. She is very frustrated and also very concerned. Says that both her and her husband's insurance is through her job. Also, says that with this job she has been making the most money she ever has -- is concerned about insurance, income---  and is concerned about losing her job.   THE FOLLOWING IS COPIED FROM PHONE NOTE 11/01/14:   Erika Sheldon, PA-C at 11/02/2014 1:40 PM     Status: Signed       Expand All Collapse All   I Called patient this morning. Discussed options- Discussed possibly going back to [facility] orthopedics Dr. Micah Noel did not think that this would be of any benefit. She said that he had done the injections that he recommended and then he is the one that recommended her follow-up with this current pain center. Regarding Dr. Andree Elk at the current pain center--she says that she has called his office this morning but has not yet heard back from them. She says that if nothing else,  she has an appointment to follow-up with him Tuesday which is May 3. Regarding seeing Dr. Hart Carwin agreed with seeing him to get another opinion. I recommended that she come here and get a copy of her records that I have just received to take to Dr. Patrice Paradise so that she can get the most out of that visit. I have received a copy of the lumbar spine MRI performed at [place] 07/16/13. I have also received copies of notes from Dr. Andree Elk at the pain  center. In the meantime, she says that she is having significant pain currently. She went to physical therapy Monday 10/31/14 and was scheduled to go back to physical therapy today Wednesday 4/27. Says that yesterday/Tuesday she was doing the exercises at home that physical therapy recommended she do. She says that the pain was even worse and her husband recognized this and told her to stop this. --She will come here and pick up copy of records to have. --She will keep appointment with Dr. Andree Elk for next Tuesday. She will keep appointment to see Dr. Patrice Paradise. She will be getting in contact with her HR department regarding FMLA etc. and then follow-up with me. I mentioned that she may even have to look into Disability, depending on treatment options that are available (or, not available)             Roney Marion at 11/01/2014 11:18 AM     Status: Signed       Expand All Collapse All   Patient has called another spine center it is dr. Patrice Paradise at [facility] [phone] her appointment is May 19,2016. She said that if you didn't want her to keep this appointment that she would cancel. She wanted to make sure you were aware of the appointment.       OV---11/10/2014: Today patient states that in the past she had had injections to the left facet. States that when she saw Dr. Andree Elk this past Tuesday, May 3 he did right facet injection. She says that after that injection the pain was much better and that was the best she had felt in a  long time. However, she subsequently went to physical therapy yesterday which was Wednesday, May 4. Says that when she left there the pain returned. Says at physical therapy she did 5 minutes on the bike and then an exercise where she is standing up and pulling on resistance bands with her hands and arms. Then did some other exercise involving lifting her legs. Then applied TENS unit to the area. Says that when she left there after sitting in her car for just a few minutes, the pain returned. She says that when she had talked to Dr. Andree Elk that he told her not to just not stop showing up for physical therapy because this would show up on her record if she tries to go to pain clinic later etc. However, she says that when she went to physical therapy-- she told them that she was having significant pain with her exercises and that they needed to modify things but she says that the therapy they did still cause severe pain again.  She says that she did have discussion with Dr. Andree Elk at her appointment there Tuesday, May 3. She says that he really did not discuss any other options regarding treatment other than what his current treatment plan is. He did recommend for her to go to physical therapy but just talked to them about modifying things so that it would not cause such pain but that she needed to the compliance with follow-up there. Not wanted on her record that she "did not show " Her appointment with Dr. Patrice Paradise is May 19. She does plan to still go see him to get his opinion. However, today she is in much better spirits regarding the fact that she did get pain relief with this last set of facet injections with Dr. Andree Elk.  Also at this point she feels that she should just plan to  return to work Monday. Says that she has come to the realization that she will just have to stand up some during the day regardless of getting her work done. Is that she does have short-term and long-term disability options but does  not want to use these at this time. Says that in general after taking this time off from work etc. she does feel a lot better in general. Says that she is not feeling as nauseous and feels that she has gotten her appetite back some compared to the way she had been feeling.   OV---01/26/2015: Since her last office visit, we have 2 different notes documented where she has called needing letters for out of work for certain dates and those are documented. Today she comes in saying that I need to complete another set of FMLA forms. States that these need to back date to start covering 01/20/2015. She brings in appointment cards from her different doctors with upcoming scheduled appointments. States that she is going to need to stay out of work until she has these upcoming appointments. Says that at present there is no way she can work in the condition that she is in and so until she has these follow-up treatments, her pain is not going to be improved and she is not going to be able to consider return to work. She has the following appointment scheduled: Dr[facility]k [facility] 02/14/15 at 10:00 AM Dr.: At the spine and scoliosis specialist August 10 at 9:45 AM Dr. [facility] on 02/28/15 at 8:20 AM  Therefore, on this present set of FMLA forms will plan to keep her out of work through Sunday 03/05/2015. This will get her through all of the appointments listed above, give her time to see what comes of those appointments/treatments, to decide whether she is indeed going to be able to return to work Monday 03/06/15.   Past Medical History  Diagnosis Date  . Hypertension   . Anxiety   . Frequent UTI   . Chronic low back pain      Past Surgical History  Procedure Laterality Date  . Knee surgery      left  . Wisdom tooth extraction    . Knee surgery Left 07/08/2012    Home Meds:  Outpatient Prescriptions Prior to Visit  Medication Sig Dispense  Refill  . ALPRAZolam (XANAX) 0.5 MG tablet TAKE 1 TABLET 3 TIMES A DAY AS NEEDED FOR ANXIETY 90 tablet 0  . aspirin 81 MG tablet Take 81 mg by mouth daily.      Marland Kitchen atenolol (TENORMIN) 25 MG tablet Take 1 tablet (25 mg total) by mouth daily. 30 tablet 3  . calcium-vitamin D (OSCAL WITH D) 250-125 MG-UNIT per tablet Take 1 tablet by mouth daily.      . celecoxib (CELEBREX) 100 MG capsule TAKE 1 CAPSULE (100 MG TOTAL) BY MOUTH 2 (TWO) TIMES DAILY. 60 capsule 3  . cetirizine (ZYRTEC) 10 MG tablet Take 10 mg by mouth daily.    . citalopram (CELEXA) 40 MG tablet Take 1 tablet (40 mg total) by mouth daily. 30 tablet 3  . esomeprazole (NEXIUM) 40 MG capsule Take 1 capsule (40 mg total) by mouth daily. 30 capsule 11  . fish oil-omega-3 fatty acids 1000 MG capsule Take 2 g by mouth 2 (two) times daily.      Marland Kitchen gabapentin (NEURONTIN) 300 MG capsule Take 1 capsule by mouth 4 (four)  times daily.     . hydrochlorothiazide (HYDRODIURIL) 25 MG tablet Take 1 tablet (25 mg total) by mouth daily. 30 tablet 3  . HYDROcodone-acetaminophen (NORCO) 10-325 MG per tablet   0  . losartan (COZAAR) 100 MG tablet Take 1 tablet (100 mg total) by mouth daily. 90 tablet 3  . meloxicam (MOBIC) 15 MG tablet     . norgestrel-ethinyl estradiol (CRYSELLE-28) 0.3-30 MG-MCG tablet Take 1 tablet by mouth daily. 28 tablet 3  . promethazine (PHENERGAN) 25 MG tablet Take 1 tablet (25 mg total) by mouth every 8 (eight) hours as needed for nausea or vomiting. 10 tablet 0  . sulfamethoxazole-trimethoprim (BACTRIM DS,SEPTRA DS) 800-160 MG per tablet TAKE 1 TABLET BY MOUTH ONCE AFTER INTERCOURSE AS NEEDED 30 tablet 3  . traMADol (ULTRAM) 50 MG tablet Take 1 tablet (50 mg total) by mouth every 8 (eight) hours as needed. 30 tablet 0  . tiZANidine (ZANAFLEX) 2 MG tablet Take 2 mg by mouth 2 (two) times daily as needed.  2  . zoster vaccine live, PF, (ZOSTAVAX) 49675 UNT/0.65ML injection Inject 19,400 Units into the skin once. 1 each 0  . zolpidem  (AMBIEN) 10 MG tablet Take 1 tablet (10 mg total) by mouth at bedtime as needed for sleep. 30 tablet 2   No facility-administered medications prior to visit.    Allergies:  Allergies  Allergen Reactions  . Norvasc [Amlodipine Besylate] Swelling and Rash    History   Social History  . Marital Status: Married    Spouse Name: N/A  . Number of Children: N/A  . Years of Education: N/A   Occupational History  . Not on file.   Social History Main Topics  . Smoking status: Never Smoker   . Smokeless tobacco: Never Used  . Alcohol Use: 14.4 oz/week    24 Cans of beer per week  . Drug Use: No  . Sexual Activity: Not on file   Other Topics Concern  . Not on file   Social History Narrative    Family History  Problem Relation Age of Onset  . Cancer Mother 44    Breast Cancer  . Heart disease Father 41  . Diabetes Father     Physical Exam: Blood pressure 162/84, pulse 68, temperature 98.8 F (37.1 C), temperature source Oral, resp. rate 16, height $RemoveBe'5\' 4"'DXFOggCSa$  (1.626 m), weight 141 lb (63.957 kg)., Body mass index is 24.19 kg/(m^2). General: Well developed, well nourished, WF. Appears in no acute distress. Lungs: Clear to auscultation bilaterally without wheezes, rales, or rhonchi. Breathing is of normal effort and unlabored. Cardiovascular: RRR with S1 S2. No murmurs, rubs, or gallops.  Musculoskeletal: Full range of motion and 5/5 strength throughout. Skin: Warm and moist without erythema, ecchymosis, wounds, or rash. Neuro: A+Ox3. CN II-XII grossly intact. Moves all extremities spontaneously.  Psych:  Responds to questions appropriately with a normal affect.   Assessment/Plan:  54 y.o. y/o female here for    3. Chronic low back pain Lumbar herniated disc  Chronic pain syndrome  DDD (degenerative disc disease), lumbar  Spondylolysis  Lumbosacral radiculitis     For other Dx, See Note 07/2014: 8. Generalized OA On Celebrex for this in addition to the treatment by the  pain clinic.  9. Anxiety This is controlled with current medication.   The following is copied from her CPE---01/2014:  1. Visit for preventive health examination  A. Screening Labs: She is fasting. - CBC with Differential - COMPLETE METABOLIC PANEL WITH GFR -  Lipid panel - TSH  B. Pap: Last Pap smear was done by me 05/08/2010. Normal.  PAP, Thin Prep w/HPV rflx HPV Type 16/18   C. Screening Mammogram: Her mammogram is up to date. This was last performed 08/09/2013.  D. DEXA/BMD:  Can wait to discuss closer to age 83  E. Colorectal Cancer Screening: She has not had screening colonoscopy in the past but now is agreeable for me to go ahead and schedule referral for this.  Ambulatory referral to Gastroenterology  F. Immunizations:  Influenza: N/A Tetanus: She has not had a tetanus in the last 10 years. She is agreeable to update this today. Pneumococcal: No indication to give this until age 79 Zostavax: Discussed this at age 63    2. Family History Of Breast Cancer Mother was diagnosed with breast cancer at age 75 y/o. Died at age 62. Mammogram is up to date. Last was performed 08/09/2013. Breast exam performed today.    Marin Olp [place], [place], [facility] 01/26/2015 5:25 PM

## 2015-01-26 NOTE — Telephone Encounter (Signed)
Pt called stating that if [place] calls in regarding FMLA she is giving full permission to speak to them and give them any information needed.

## 2015-02-08 ENCOUNTER — Other Ambulatory Visit: Payer: Self-pay | Admitting: Physician Assistant

## 2015-02-08 NOTE — Telephone Encounter (Signed)
Medication refilled per protocol. 

## 2015-02-10 ENCOUNTER — Other Ambulatory Visit: Payer: Self-pay | Admitting: Physician Assistant

## 2015-02-10 NOTE — Telephone Encounter (Signed)
Medication refilled per protocol. 

## 2015-02-10 NOTE — Telephone Encounter (Signed)
Ok to refill??  Last office visit 01/26/2015.  Last refill 11/17/2014, #2 refills.

## 2015-02-10 NOTE — Telephone Encounter (Signed)
ok 

## 2015-02-15 ENCOUNTER — Other Ambulatory Visit: Payer: Self-pay | Admitting: Orthopaedic Surgery

## 2015-02-15 ENCOUNTER — Other Ambulatory Visit: Payer: Self-pay | Admitting: Family Medicine

## 2015-02-15 DIAGNOSIS — M4316 Spondylolisthesis, lumbar region: Secondary | ICD-10-CM

## 2015-02-15 DIAGNOSIS — N63 Unspecified lump in unspecified breast: Secondary | ICD-10-CM

## 2015-02-24 ENCOUNTER — Ambulatory Visit
Admission: RE | Admit: 2015-02-24 | Discharge: 2015-02-24 | Disposition: A | Discharge status: Home / Self Care | Payer: BLUE CROSS/BLUE SHIELD | Source: Ambulatory Visit | Attending: Orthopaedic Surgery | Admitting: Orthopaedic Surgery

## 2015-02-24 DIAGNOSIS — M4316 Spondylolisthesis, lumbar region: Secondary | ICD-10-CM

## 2015-02-27 ENCOUNTER — Ambulatory Visit
Admission: RE | Admit: 2015-02-27 | Discharge: 2015-02-27 | Disposition: A | Discharge status: Home / Self Care | Payer: BLUE CROSS/BLUE SHIELD | Source: Ambulatory Visit | Attending: Family Medicine | Admitting: Family Medicine

## 2015-02-27 DIAGNOSIS — N63 Unspecified lump in unspecified breast: Secondary | ICD-10-CM

## 2015-03-13 ENCOUNTER — Other Ambulatory Visit: Payer: Self-pay | Admitting: Family Medicine

## 2015-03-16 ENCOUNTER — Other Ambulatory Visit: Payer: Self-pay | Admitting: Physician Assistant

## 2015-03-17 NOTE — Telephone Encounter (Signed)
Medication refilled per protocol. 

## 2015-03-22 ENCOUNTER — Ambulatory Visit (INDEPENDENT_AMBULATORY_CARE_PROVIDER_SITE_OTHER): Payer: BLUE CROSS/BLUE SHIELD | Admitting: Physician Assistant

## 2015-03-22 ENCOUNTER — Encounter: Payer: Self-pay | Admitting: Physician Assistant

## 2015-03-22 VITALS — BP 138/76 | HR 68 | Temp 98.4°F | Resp 18 | Ht 64.0 in | Wt 136.0 lb

## 2015-03-22 DIAGNOSIS — N939 Abnormal uterine and vaginal bleeding, unspecified: Secondary | ICD-10-CM | POA: Diagnosis not present

## 2015-03-22 DIAGNOSIS — D259 Leiomyoma of uterus, unspecified: Secondary | ICD-10-CM

## 2015-03-22 NOTE — Progress Notes (Signed)
Patient ID: Erika Harvey MRN: [id number], DOB: [birth year], 54 y.o. Date of Encounter: 03/22/2015, 10:51 AM    Chief Complaint:  Chief Complaint  Patient presents with  . Heavy periods     HPI: 54 y.o. year old white female here with above.  She is on oral contraceptives. In the past repeatedly we have had discussions regarding the fact that this can increase her risk of blood clots etc. Have also discussed the likelihood that she has gone through menopause and have discussed the very low chance that she could get pregnant (given her age of 53)--even without any type of contraception. However she absolutely did not want to stop birth control pills so they have been continued.  She states that for years she was having either very light bleeding or no bleeding at all.  However for the past 2 months she has had heavy menstrual bleeding. Therefore came for visit today.     Home Meds:   Outpatient Prescriptions Prior to Visit  Medication Sig Dispense Refill  . ALPRAZolam (XANAX) 0.5 MG tablet TAKE 1 TABLET 3 TIMES A DAY AS NEEDED FOR ANXIETY 90 tablet 0  . aspirin 81 MG tablet Take 81 mg by mouth daily.      Marland Kitchen atenolol (TENORMIN) 25 MG tablet Take 1 tablet (25 mg total) by mouth daily. 30 tablet 3  . calcium-vitamin D (OSCAL WITH D) 250-125 MG-UNIT per tablet Take 1 tablet by mouth daily.      . celecoxib (CELEBREX) 100 MG capsule TAKE 1 CAPSULE (100 MG TOTAL) BY MOUTH 2 (TWO) TIMES DAILY. 60 capsule 3  . cetirizine (ZYRTEC) 10 MG tablet Take 10 mg by mouth daily.    . citalopram (CELEXA) 40 MG tablet TAKE 1 TABLET BY MOUTH EVERY DAY 90 tablet 1  . esomeprazole (NEXIUM) 40 MG capsule Take 1 capsule (40 mg total) by mouth daily. 30 capsule 11  . fish oil-omega-3 fatty acids 1000 MG capsule Take 2 g by mouth 2 (two) times daily.      . hydrochlorothiazide (HYDRODIURIL) 25 MG tablet TAKE 1 TABLET (25 MG TOTAL) BY MOUTH DAILY. 90 tablet 1  . HYDROcodone-acetaminophen (NORCO) 10-325  MG per tablet Take 1 tablet by mouth every 4 (four) hours as needed.   0  . losartan (COZAAR) 100 MG tablet Take 1 tablet (100 mg total) by mouth daily. 90 tablet 3  . meloxicam (MOBIC) 15 MG tablet     . norgestrel-ethinyl estradiol (CRYSELLE-28) 0.3-30 MG-MCG tablet Take 1 tablet by mouth daily. 28 tablet 3  . promethazine (PHENERGAN) 25 MG tablet Take 1 tablet (25 mg total) by mouth every 8 (eight) hours as needed for nausea or vomiting. 10 tablet 0  . sulfamethoxazole-trimethoprim (BACTRIM DS,SEPTRA DS) 800-160 MG per tablet TAKE 1 TABLET BY MOUTH ONCE AFTER INTERCOURSE AS NEEDED 30 tablet 3  . traMADol (ULTRAM) 50 MG tablet Take 1 tablet (50 mg total) by mouth every 8 (eight) hours as needed. 30 tablet 0  . zolpidem (AMBIEN) 10 MG tablet TAKE 1 TABLET BY MOUTH AT BEDTIME AS NEEDED 30 tablet 2  . celecoxib (CELEBREX) 100 MG capsule TAKE 1 CAPSULE TWICE A DAY 60 capsule 11  . gabapentin (NEURONTIN) 300 MG capsule Take 1 capsule by mouth 4 (four) times daily.     Marland Kitchen LORZONE 750 MG TABS TAKE 1 TABLET BY MOUTH 4 TIMES A DAY AS NEEDED  3   No facility-administered medications prior to visit.    Allergies:  Allergies  Allergen Reactions  . Norvasc [Amlodipine Besylate] Swelling and Rash      Review of Systems: See HPI for pertinent ROS. All other ROS negative.    Physical Exam: Blood pressure 138/76, pulse 68, temperature 98.4 F (36.9 C), temperature source Oral, resp. rate 18, height 5\' 4"  (1.626 m), weight 136 lb (61.689 kg)., Body mass index is 23.33 kg/(m^2). General:  WNWD WF. Appears in no acute distress. Neck: Supple. No thyromegaly. No lymphadenopathy. Lungs: Clear bilaterally to auscultation without wheezes, rales, or rhonchi. Breathing is unlabored. Heart: Regular rhythm. No murmurs, rubs, or gallops. Msk:  Strength and tone normal for age. Extremities/Skin: Warm and dry. Neuro: Alert and oriented X 3. Moves all extremities spontaneously. Gait is normal. CNII-XII grossly in  tact. Psych:  Responds to questions appropriately with a normal affect.     ASSESSMENT AND PLAN:  54 y.o. year old female with  1. Abnormal uterine bleeding - Ambulatory referral to Gynecology I stressed importance of follow-up with gynecology with patient. Explained to them that they can do test to determine whether this is secondary to a fibroid versus cancer. Even though the MRI report incidentally mentioned "uterine fibroid" she definitely needs follow-up with gynecology--- to make sure this is not cancer and also for treatment so that she does not continue with blood loss. Patient voices understanding and agrees.  2. Uterine leiomyoma, unspecified location She recently had MRI lumbar spine at [place] on 02/24/2015. Its overall impression included #4 under impressions is "probable uterine fibroid and adenomyosis." - Ambulatory referral to Gynecology   Signed, Olean Ree [place], [place], [facility] 03/22/2015 10:51 AM

## 2015-03-23 ENCOUNTER — Telehealth: Payer: Self-pay | Admitting: Family Medicine

## 2015-03-23 NOTE — Telephone Encounter (Signed)
Pt called with a question for [facility] about her pain medication. Please call [phone]

## 2015-03-24 ENCOUNTER — Telehealth: Payer: Self-pay | Admitting: Family Medicine

## 2015-03-24 MED ORDER — PROMETHAZINE HCL 25 MG PO TABS: 25.0000 mg | ORAL_TABLET | Freq: Three times a day (TID) | ORAL | Status: DC | PRN

## 2015-03-24 NOTE — Telephone Encounter (Signed)
Pt called back.  Stated was not in need of pain meds right now.  Was concerned about GYN referral.  Assured her GYN ref was sent to [facility] in [place].  I gave them there number to call at her request.

## 2015-03-24 NOTE — Telephone Encounter (Signed)
Medication refilled per protocol. 

## 2015-03-24 NOTE — Telephone Encounter (Signed)
Pt called to request a refill of anti nausea medication called in to the [facility] on [address]. Pt # [phone]

## 2015-03-30 ENCOUNTER — Encounter: Payer: Self-pay | Admitting: Adult Health

## 2015-03-30 ENCOUNTER — Ambulatory Visit (INDEPENDENT_AMBULATORY_CARE_PROVIDER_SITE_OTHER): Payer: BLUE CROSS/BLUE SHIELD | Admitting: Adult Health

## 2015-03-30 ENCOUNTER — Other Ambulatory Visit: Payer: Self-pay | Admitting: Physician Assistant

## 2015-03-30 VITALS — BP 142/84 | HR 80 | Ht 64.0 in | Wt 137.0 lb

## 2015-03-30 DIAGNOSIS — D219 Benign neoplasm of connective and other soft tissue, unspecified: Secondary | ICD-10-CM

## 2015-03-30 DIAGNOSIS — D259 Leiomyoma of uterus, unspecified: Secondary | ICD-10-CM | POA: Diagnosis not present

## 2015-03-30 DIAGNOSIS — N939 Abnormal uterine and vaginal bleeding, unspecified: Secondary | ICD-10-CM

## 2015-03-30 HISTORY — DX: Benign neoplasm of connective and other soft tissue, unspecified: D21.9

## 2015-03-30 HISTORY — DX: Abnormal uterine and vaginal bleeding, unspecified: N93.9

## 2015-03-30 NOTE — Patient Instructions (Signed)
Return in 1 week for [place] Menopause Menopause is the normal time of life when menstrual periods stop completely. Menopause is complete when you have missed 12 consecutive menstrual periods. It usually occurs between the ages of 50 years and 81 years. Very rarely does a woman develop menopause before the age of 69 years. At menopause, your ovaries stop producing the female hormones estrogen and progesterone. This can cause undesirable symptoms and also affect your health. Sometimes the symptoms may occur 4-5 years before the menopause begins. There is no relationship between menopause and:  Oral contraceptives.  Number of children you had.  Race.  The age your menstrual periods started (menarche). Heavy smokers and very thin women may develop menopause earlier in life. CAUSES  The ovaries stop producing the female hormones estrogen and progesterone.  Other causes include:  Surgery to remove both ovaries.  The ovaries stop functioning for no known reason.  Tumors of the pituitary gland in the brain.  Medical disease that affects the ovaries and hormone production.  Radiation treatment to the abdomen or pelvis.  Chemotherapy that affects the ovaries. SYMPTOMS   Hot flashes.  Night sweats.  Decrease in sex drive.  Vaginal dryness and thinning of the vagina causing painful intercourse.  Dryness of the skin and developing wrinkles.  Headaches.  Tiredness.  Irritability.  Memory problems.  Weight gain.  Bladder infections.  Hair growth of the face and chest.  Infertility. More serious symptoms include:  Loss of bone (osteoporosis) causing breaks (fractures).  Depression.  Hardening and narrowing of the arteries (atherosclerosis) causing heart attacks and strokes. DIAGNOSIS   When the menstrual periods have stopped for 12 straight months.  Physical exam.  Hormone studies of the blood. TREATMENT  There are many treatment choices and nearly as many questions  about them. The decisions to treat or not to treat menopausal changes is an individual choice made with your health care provider. Your health care provider can discuss the treatments with you. Together, you can decide which treatment will work best for you. Your treatment choices may include:   Hormone therapy (estrogen and progesterone).  Non-hormonal medicines.  Treating the individual symptoms with medicine (for example antidepressants for depression).  Herbal medicines that may help specific symptoms.  Counseling by a psychiatrist or psychologist.  Group therapy.  Lifestyle changes including:  Eating healthy.  Regular exercise.  Limiting caffeine and alcohol.  Stress management and meditation.  No treatment. HOME CARE INSTRUCTIONS   Take the medicine your health care provider gives you as directed.  Get plenty of sleep and rest.  Exercise regularly.  Eat a diet that contains calcium (good for the bones) and soy products (acts like estrogen hormone).  Avoid alcoholic beverages.  Do not smoke.  If you have hot flashes, dress in layers.  Take supplements, calcium, and vitamin D to strengthen bones.  You can use over-the-counter lubricants or moisturizers for vaginal dryness.  Group therapy is sometimes very helpful.  Acupuncture may be helpful in some cases. SEEK MEDICAL CARE IF:   You are not sure you are in menopause.  You are having menopausal symptoms and need advice and treatment.  You are still having menstrual periods after age 29 years.  You have pain with intercourse.  Menopause is complete (no menstrual period for 12 months) and you develop vaginal bleeding.  You need a referral to a specialist (gynecologist, psychiatrist, or psychologist) for treatment. SEEK IMMEDIATE MEDICAL CARE IF:   You have severe depression.  You have excessive vaginal bleeding.  You fell and think you have a broken bone.  You have pain when you urinate.  You  develop leg or chest pain.  You have a fast pounding heart beat (palpitations).  You have severe headaches.  You develop vision problems.  You feel a lump in your breast.  You have abdominal pain or severe indigestion. Document Released: 09/14/2003 Document Revised: 02/24/2013 Document Reviewed: 01/21/2013 [facility] Patient Information 2015 [place], [place]. This information is not intended to replace advice given to you by your health care provider. Make sure you discuss any questions you have with your health care provider. Uterine Fibroid A uterine fibroid is a growth (tumor) that occurs in your uterus. This type of tumor is not cancerous and does not spread out of the uterus. You can have one or many fibroids. Fibroids can vary in size, weight, and where they grow in the uterus. Some can become quite large. Most fibroids do not require medical treatment, but some can cause pain or heavy bleeding during and between periods. CAUSES  A fibroid is the result of a single uterine cell that keeps growing (unregulated), which is different than most cells in the human body. Most cells have a control mechanism that keeps them from reproducing without control.  SIGNS AND SYMPTOMS   Bleeding.  Pelvic pain and pressure.  Bladder problems due to the size of the fibroid.  Infertility and miscarriages depending on the size and location of the fibroid. DIAGNOSIS  Uterine fibroids are diagnosed through a physical exam. Your health care provider may feel the lumpy tumors during a pelvic exam. Ultrasonography may be done to get information regarding size, location, and number of tumors.  TREATMENT   Your health care provider may recommend watchful waiting. This involves getting the fibroid checked by your health care provider to see if it grows or shrinks.   Hormone treatment or an intrauterine device (IUD) may be prescribed.   Surgery may be needed to remove the fibroids (myomectomy) or the uterus  (hysterectomy). This depends on your situation. When fibroids interfere with fertility and a woman wants to become pregnant, a health care provider may recommend having the fibroids removed.  [place] care depends on how you were treated. In general:   Keep all follow-up appointments with your health care provider.   Only take over-the-counter or prescription medicines as directed by your health care provider. If you were prescribed a hormone treatment, take the hormone medicines exactly as directed. Do not take aspirin. It can cause bleeding.   Talk to your health care provider about taking iron pills.  If your periods are troublesome but not so heavy, lie down with your feet raised slightly above your heart. Place cold packs on your lower abdomen.   If your periods are heavy, write down the number of pads or tampons you use per month. Bring this information to your health care provider.   Include green vegetables in your diet.  SEEK IMMEDIATE MEDICAL CARE IF:  You have pelvic pain or cramps not controlled with medicines.   You have a sudden increase in pelvic pain.   You have an increase in bleeding between and during periods.   You have excessive periods and soak tampons or pads in a half hour or less.  You feel lightheaded or have fainting episodes. Document Released: 06/21/2000 Document Revised: 04/14/2013 Document Reviewed: 01/21/2013 [facility] Patient Information 2015 [place], [place]. This information is not intended to  replace advice given to you by your health care provider. Make sure you discuss any questions you have with your health care provider.  

## 2015-03-30 NOTE — Progress Notes (Signed)
Subjective:     Patient ID: Erika Harvey, female   DOB: June 19, 1961, 54 y.o.   MRN: [id number]  HPI Erika Harvey is a 54 year old white female, new to this practice, in complaining of bleeding and had fibroid shown on MRI.She has back problems.She has been on lo ovral for years and has not had any bleeding til missed almost a month of pills and had period,then got back on pills and had another period.Had gone almost 5 years with just occasional spotting.  Review of Systems Patient denies any headaches, hearing loss, fatigue, blurred vision, shortness of breath, chest pain, abdominal pain, problems with bowel movements, urination, or intercourse. No joint pain or mood swings.See HPI for positives.  Reviewed past medical,surgical, social and family history. Reviewed medications and allergies.     Objective:   Physical Exam BP 142/84 mmHg  Pulse 80  Ht 5\' 4"  (1.626 m)  Wt 137 lb (62.143 kg)  BMI 23.50 kg/m2  LMP 03/23/2015 Skin warm and dry.Pelvic: external genitalia is normal in appearance no lesions, vagina: has good color,moisture and rugae,urethra has no lesions or masses noted, cervix:smooth, uterus: enlarged, ?fibroid to right, non tender, no masses felt, adnexa: no masses or tenderness noted. Bladder is non tender and no masses felt.Discussed staying on lo ovral for now and getting [place] to assess uterus.    Assessment:     Vaginal bleeding Fibroid     Plan:     Return in 1 week for gyn [place] Review handout on fibroids

## 2015-03-31 NOTE — Telephone Encounter (Signed)
ok 

## 2015-03-31 NOTE — Telephone Encounter (Signed)
Ok to refill??  Last office visit 03/22/2015.  Last refill 01/05/2015.

## 2015-03-31 NOTE — Telephone Encounter (Signed)
Medication called to pharmacy. 

## 2015-04-06 ENCOUNTER — Ambulatory Visit (INDEPENDENT_AMBULATORY_CARE_PROVIDER_SITE_OTHER): Payer: BLUE CROSS/BLUE SHIELD

## 2015-04-06 DIAGNOSIS — N939 Abnormal uterine and vaginal bleeding, unspecified: Secondary | ICD-10-CM | POA: Diagnosis not present

## 2015-04-06 DIAGNOSIS — D259 Leiomyoma of uterus, unspecified: Secondary | ICD-10-CM | POA: Diagnosis not present

## 2015-04-06 NOTE — Progress Notes (Signed)
PELVIC US TV/TA: retroverted heterogenous uterus w/ a 6.8 x 5.5 x 5.7cm centrally located fibroid w mult calcification,unable to see endometrium because of fibroid,normal (mobile) bilat ov's,no free fluid,no pain during ultrasound

## 2015-04-07 ENCOUNTER — Telehealth: Payer: Self-pay | Admitting: Adult Health

## 2015-04-07 NOTE — Telephone Encounter (Signed)
Pt aware that US showed normal ovaries but has large fibroid that obscures endometrium will get endo biopsy to assess bleeding.still on OCs

## 2015-04-14 ENCOUNTER — Ambulatory Visit (INDEPENDENT_AMBULATORY_CARE_PROVIDER_SITE_OTHER): Payer: BLUE CROSS/BLUE SHIELD | Admitting: Obstetrics & Gynecology

## 2015-04-14 ENCOUNTER — Encounter: Payer: Self-pay | Admitting: Obstetrics & Gynecology

## 2015-04-14 VITALS — BP 118/80 | HR 76 | Wt 134.0 lb

## 2015-04-14 DIAGNOSIS — N950 Postmenopausal bleeding: Secondary | ICD-10-CM

## 2015-04-14 MED ORDER — FLUCONAZOLE 150 MG PO TABS: 150.0000 mg | ORAL_TABLET | Freq: Once | ORAL | Status: DC

## 2015-04-14 NOTE — Progress Notes (Signed)
Patient ID: Erika Harvey, female   DOB: 11/27/1960, 54 y.o.   MRN: [id number] Endometrial Biopsy Procedure Note  Pre-operative Diagnosis: Post menopausal bleeding  Post-operative Diagnosis: same  Indications: bleeding on postmenopausal hormone replacement  Procedure Details   Urine pregnancy test was not done.  The risks (including infection, bleeding, pain, and uterine perforation) and benefits of the procedure were explained to the patient and Written informed consent was obtained.  Antibiotic prophylaxis against endocarditis was not indicated.   The patient was placed in the dorsal lithotomy position.  Bimanual exam showed the uterus to be in the neutral position.  A Graves' speculum inserted in the vagina, and the cervix prepped with povidone iodine.  Endocervical curettage with a Kevorkian curette was not performed.   A sharp tenaculum was applied to the anterior lip of the cervix for stabilization.  A sterile uterine sound was used to sound the uterus to a depth of 6cm.  A Pipelle endometrial aspirator was used to sample the endometrium.  Sample was sent for pathologic examination.  Condition: Stable  Complications: None  Plan:  The patient was advised to call for any fever or for prolonged or severe pain or bleeding. She was advised to use OTC ibuprofen as needed for mild to moderate pain. She was advised to avoid vaginal intercourse for 48 hours or until the bleeding has completely stopped.  Attending Physician Documentation: I was present for or performed the following: endometrial biopsy

## 2015-04-14 NOTE — Addendum Note (Signed)
Addended by: Diona Fanti A on: 04/14/2015 01:32 PM   Modules accepted: Orders

## 2015-04-17 ENCOUNTER — Other Ambulatory Visit: Payer: Self-pay | Admitting: Obstetrics & Gynecology

## 2015-04-21 ENCOUNTER — Encounter: Payer: Self-pay | Admitting: Obstetrics & Gynecology

## 2015-04-21 ENCOUNTER — Ambulatory Visit (INDEPENDENT_AMBULATORY_CARE_PROVIDER_SITE_OTHER): Payer: BLUE CROSS/BLUE SHIELD | Admitting: Obstetrics & Gynecology

## 2015-04-21 VITALS — BP 110/70 | HR 76 | Wt 139.0 lb

## 2015-04-21 DIAGNOSIS — N950 Postmenopausal bleeding: Secondary | ICD-10-CM | POA: Diagnosis not present

## 2015-04-21 MED ORDER — PROGESTERONE MICRONIZED 200 MG PO CAPS: ORAL_CAPSULE | ORAL | Status: DC

## 2015-04-21 NOTE — Progress Notes (Signed)
Patient ID: Erika Harvey, female   DOB: 1961-03-15, 54 y.o.   MRN: [id number] Chief Complaint  Patient presents with  . Follow-up    result of biopsy.    Blood pressure 110/70, pulse 76, weight 139 lb (63.05 kg), last menstrual period 03/23/2015.  54 y.o. G0P0000 Patient's last menstrual period was 03/23/2015. The current method of family planning is post menopausal status.  Subjective Patient's here to go over the biopsy report we did is result postmenopausal bleeding and thickened endometrial stripe  The pathology returned as benign with marked progestational effect hyperplasia no atypia  Objective   Pertinent ROS   Labs or studies C endometrial biopsy report    Impression Diagnoses this Encounter::   ICD-9-CM ICD-10-CM   1. Post-menopause bleeding 627.1 N95.0     Established relevant diagnosis(es):   Plan/Recommendations: Meds ordered this encounter  Medications  . oxyCODONE-acetaminophen (PERCOCET) 7.5-325 MG tablet    Sig: Take 1 tablet by mouth every 4 (four) hours as needed for severe pain.  . chlorzoxazone (PARAFON) 500 MG tablet    Sig: Take by mouth 4 (four) times daily as needed for muscle spasms.  . progesterone (PROMETRIUM) 200 MG capsule    Sig: Take  One at bedtime    Dispense:  30 capsule    Refill:  11    Labs or Scans Ordered: No orders of the defined types were placed in this encounter.    My recommendation for Erika Harvey is to take Prometrium cyclically 175 mg the first 10 days of the month or so This will certainly cover her protector endometrium If over the next year she has no bleeding or rare irregular bleeding we get every 3 months and if she goes a couple years with no bleeding we can stop it altogether  My thought is year or so this is all she is in need  Follow up Return in about 1 year (around 04/20/2016) for yearly.      Face to face time:  15 minutes  Greater than 50% of the visit time was spent in counseling and  coordination of care with the patient.  The summary and outline of the counseling and care coordination is summarized in the note above.   All questions were answered.

## 2015-04-24 ENCOUNTER — Telehealth: Payer: Self-pay | Admitting: Obstetrics & Gynecology

## 2015-04-24 NOTE — Telephone Encounter (Signed)
Take 1 tablet a day the first 10 days of each month(sp each prescription will last her 3 months)

## 2015-04-24 NOTE — Telephone Encounter (Signed)
Pt informed to take Progesterone 1 tablet the first 10 days of each month, 1 prescription will last pt 3 months per Dr. Elonda Husky. Pt verbalized understanding.

## 2015-04-24 NOTE — Telephone Encounter (Signed)
Pt called requesting clarification on how to take the Progesterone. Pt states she thought she was to take 12 tablets/12 days a month but the RX had # 30 with 11 refills and to no longer take the BCP.    Please clarify.

## 2015-04-26 ENCOUNTER — Telehealth: Payer: Self-pay | Admitting: *Deleted

## 2015-04-26 NOTE — Telephone Encounter (Signed)
Yes remember, if you bleed it is because you have proliferative or stimulated endometrium Based on the biopsy that is expected So take the progesterone Eventually the progesterone suppression will be greater than the proliferative stimulation and the bleeding will cease  Long story short...normal

## 2015-04-27 NOTE — Telephone Encounter (Signed)
Pt informed to continue Progesterone per Dr. Elonda Husky and vaginal bleeding will resolve in time. Pt verbalized understanding.

## 2015-05-04 ENCOUNTER — Other Ambulatory Visit: Payer: Self-pay | Admitting: Family Medicine

## 2015-05-04 NOTE — Telephone Encounter (Signed)
Ok to refill??  Last office visit 04/21/2015.  Last refill 02/10/2015, #2 refills.

## 2015-05-04 NOTE — Telephone Encounter (Signed)
ok 

## 2015-05-04 NOTE — Telephone Encounter (Signed)
Medication called to pharmacy. 

## 2015-05-05 ENCOUNTER — Other Ambulatory Visit: Payer: Self-pay | Admitting: Family Medicine

## 2015-05-10 ENCOUNTER — Other Ambulatory Visit: Payer: Self-pay | Admitting: Family Medicine

## 2015-05-11 NOTE — Telephone Encounter (Signed)
Medication refilled per protocol. 

## 2015-05-16 HISTORY — PX: BACK SURGERY: SHX140

## 2015-06-07 ENCOUNTER — Ambulatory Visit (INDEPENDENT_AMBULATORY_CARE_PROVIDER_SITE_OTHER): Payer: BLUE CROSS/BLUE SHIELD | Admitting: Physician Assistant

## 2015-06-07 ENCOUNTER — Encounter: Payer: Self-pay | Admitting: Physician Assistant

## 2015-06-07 VITALS — BP 104/64 | HR 64 | Temp 98.7°F | Resp 18 | Wt 138.0 lb

## 2015-06-07 DIAGNOSIS — N390 Urinary tract infection, site not specified: Secondary | ICD-10-CM | POA: Diagnosis not present

## 2015-06-07 DIAGNOSIS — R8290 Unspecified abnormal findings in urine: Secondary | ICD-10-CM

## 2015-06-07 LAB — URINALYSIS, ROUTINE W REFLEX MICROSCOPIC
Bilirubin Urine: NEGATIVE
Glucose, UA: NEGATIVE
Hgb urine dipstick: NEGATIVE
NITRITE: NEGATIVE
PH: 5.5 (ref 5.0–8.0)
Protein, ur: NEGATIVE
SPECIFIC GRAVITY, URINE: 1.015 (ref 1.001–1.035)

## 2015-06-07 LAB — URINALYSIS, MICROSCOPIC ONLY
CRYSTALS: NONE SEEN [HPF]
Yeast: NONE SEEN [HPF]

## 2015-06-07 MED ORDER — CIPROFLOXACIN HCL 500 MG PO TABS: 500.0000 mg | ORAL_TABLET | Freq: Two times a day (BID) | ORAL | Status: DC

## 2015-06-07 NOTE — Progress Notes (Signed)
Patient ID: Erika Harvey MRN: [id number], DOB: June 23, 1961, 54 y.o. Date of Encounter: 06/07/2015, 11:46 AM    Chief Complaint:  Chief Complaint  Patient presents with  . problem with GYN and meds     HPI: 54 y.o. year old white female says that she is already had her back surgery with Dr. Patrice Paradise and says that she is so much better and wishes she had done the surgery way before now. Says that she has also seen GYN and they told her no more birth control pills and have her on progesterone certain days of the month. Says that they recently did the endometrial biopsy and she does not have cancer. Says that when she saw them she also had a yeast infection that they treated.  Says now she is having malodorous urine and burning when she urinates. No vaginal itching and no vaginal discharge.     Home Meds:   Outpatient Prescriptions Prior to Visit  Medication Sig Dispense Refill  . ALPRAZolam (XANAX) 0.5 MG tablet TAKE 1 TABLET 3 TIMES A DAY AS NEEDED 90 tablet 2  . aspirin 81 MG tablet Take 81 mg by mouth daily.      Marland Kitchen atenolol (TENORMIN) 25 MG tablet Take 1 tablet (25 mg total) by mouth daily. 30 tablet 3  . calcium-vitamin D (OSCAL WITH D) 250-125 MG-UNIT per tablet Take 1 tablet by mouth daily.      . celecoxib (CELEBREX) 100 MG capsule TAKE 1 CAPSULE (100 MG TOTAL) BY MOUTH 2 (TWO) TIMES DAILY. 60 capsule 3  . cetirizine (ZYRTEC) 10 MG tablet Take 10 mg by mouth daily.    . citalopram (CELEXA) 40 MG tablet TAKE 1 TABLET BY MOUTH EVERY DAY 90 tablet 1  . esomeprazole (NEXIUM) 40 MG capsule Take 1 capsule (40 mg total) by mouth daily. 30 capsule 11  . fish oil-omega-3 fatty acids 1000 MG capsule Take 2 g by mouth 2 (two) times daily.      . Gabapentin Enacarbil (HORIZANT) 600 MG TBCR Take 600 mg by mouth 2 (two) times daily.    . hydrochlorothiazide (HYDRODIURIL) 25 MG tablet TAKE 1 TABLET (25 MG TOTAL) BY MOUTH DAILY. 90 tablet 1  . HYDROcodone-acetaminophen (NORCO) 10-325 MG  per tablet Take 1 tablet by mouth every 4 (four) hours as needed.   0  . losartan (COZAAR) 100 MG tablet Take 1 tablet (100 mg total) by mouth daily. 90 tablet 3  . progesterone (PROMETRIUM) 200 MG capsule Take  One at bedtime 30 capsule 11  . zolpidem (AMBIEN) 10 MG tablet TAKE 1 TABLET EVERY DAY AT BEDTIME AS NEEDED 30 tablet 2  . chlorzoxazone (PARAFON) 500 MG tablet Take by mouth 4 (four) times daily as needed for muscle spasms.    . fluconazole (DIFLUCAN) 150 MG tablet Take 1 tablet (150 mg total) by mouth once. Take the second tablet 3 days after the first one. (Patient not taking: Reported on 04/21/2015) 2 tablet 0  . norgestrel-ethinyl estradiol (CRYSELLE-28) 0.3-30 MG-MCG tablet Take 1 tablet by mouth daily. (Patient not taking: Reported on 06/07/2015) 28 tablet 3  . oxyCODONE-acetaminophen (PERCOCET) 7.5-325 MG tablet Take 1 tablet by mouth every 4 (four) hours as needed for severe pain.    . promethazine (PHENERGAN) 25 MG tablet Take 1 tablet (25 mg total) by mouth every 8 (eight) hours as needed for nausea or vomiting. (Patient not taking: Reported on 06/07/2015) 10 tablet 0  . sulfamethoxazole-trimethoprim (BACTRIM DS,SEPTRA DS) 800-160 MG  tablet TAKE 1 TABLET BY MOUTH ONCE AFTER INTERCOURSE AS NEEDED (Patient not taking: Reported on 06/07/2015) 30 tablet 3   No facility-administered medications prior to visit.    Allergies:  Allergies  Allergen Reactions  . Norvasc [Amlodipine Besylate] Swelling and Rash      Review of Systems: See HPI for pertinent ROS. All other ROS negative.    Physical Exam: Blood pressure 104/64, pulse 64, temperature 98.7 F (37.1 C), temperature source Oral, resp. rate 18, weight 138 lb (62.596 kg)., Body mass index is 23.68 kg/(m^2). General:  WNWD WF. Appears in no acute distress. Neck: Supple. No thyromegaly. No lymphadenopathy. Lungs: Clear bilaterally to auscultation without wheezes, rales, or rhonchi. Breathing is unlabored. Heart: Regular  rhythm. No murmurs, rubs, or gallops. Abdomen: Soft, non-tender, non-distended with normoactive bowel sounds. No hepatomegaly. No rebound/guarding. No obvious abdominal masses. Msk:  Strength and tone normal for age. She is wearing a back brace. Extremities/Skin: Warm and dry. Neuro: Alert and oriented X 3. Moves all extremities spontaneously. Gait is normal. CNII-XII grossly in tact. Psych:  Responds to questions appropriately with a normal affect.   Results for orders placed or performed in visit on 06/07/15  Urinalysis, Routine w reflex microscopic (not at [facility])  Result Value Ref Range   Color, Urine YELLOW YELLOW   APPearance CLEAR CLEAR   Specific Gravity, Urine 1.015 1.001 - 1.035   pH 5.5 5.0 - 8.0   Glucose, UA NEGATIVE NEGATIVE   Bilirubin Urine NEGATIVE NEGATIVE   Ketones, ur TRACE (A) NEGATIVE   Hgb urine dipstick NEGATIVE NEGATIVE   Protein, ur NEGATIVE NEGATIVE   Nitrite NEGATIVE NEGATIVE   Leukocytes, UA 1+ (A) NEGATIVE  Urine Microscopic  Result Value Ref Range   WBC, UA 10-20 (A) <=5 WBC/HPF   RBC / HPF 0-2 <=2 RBC/HPF   Squamous Epithelial / LPF 0-5 <=5 HPF   Bacteria, UA FEW (A) NONE SEEN HPF   Crystals NONE SEEN NONE SEEN HPF   Casts 1-3 (A) NONE SEEN LPF   Yeast NONE SEEN NONE SEEN HPF   Urine-Other MUCUS SEEN      ASSESSMENT AND PLAN:  54 y.o. year old female with  1. Urinary tract infection, site not specified She is to take antibiotic as directed. Follow-up if symptoms do not resolve after completion of antibiotic. - ciprofloxacin (CIPRO) 500 MG tablet; Take 1 tablet (500 mg total) by mouth 2 (two) times daily.  Dispense: 6 tablet; Refill: 0  2. Malodorous urine - Urinalysis, Routine w reflex microscopic (not at [facility])   Signed, [facility] [place], [place], [facility] 06/07/2015 11:46 AM

## 2015-06-13 ENCOUNTER — Telehealth: Payer: Self-pay | Admitting: Family Medicine

## 2015-06-13 DIAGNOSIS — E2839 Other primary ovarian failure: Secondary | ICD-10-CM

## 2015-06-13 NOTE — Telephone Encounter (Signed)
Patient would like referral for bone density  [phone]

## 2015-06-15 NOTE — Telephone Encounter (Signed)
Order placed for Bone Density

## 2015-06-22 ENCOUNTER — Telehealth: Payer: Self-pay | Admitting: Family Medicine

## 2015-06-22 NOTE — Telephone Encounter (Signed)
Pt is requesting a refill of phenergan. She would like to use the [facility] in [place]. [phone]

## 2015-06-23 MED ORDER — PROMETHAZINE HCL 25 MG PO TABS: 25.0000 mg | ORAL_TABLET | Freq: Three times a day (TID) | ORAL | Status: DC | PRN

## 2015-06-23 NOTE — Telephone Encounter (Signed)
Medication called/sent to requested pharmacy  

## 2015-06-27 ENCOUNTER — Other Ambulatory Visit: Payer: Self-pay | Admitting: Family Medicine

## 2015-06-27 MED ORDER — ALPRAZOLAM 0.5 MG PO TABS: ORAL_TABLET | ORAL | Status: DC

## 2015-06-27 NOTE — Telephone Encounter (Signed)
rx called in

## 2015-06-27 NOTE — Telephone Encounter (Signed)
Approved. # [id number] 2. 

## 2015-06-27 NOTE — Telephone Encounter (Signed)
LRF 03/31/15 #90 + 2.  LOV 06/07/15  OK refill?

## 2015-07-06 ENCOUNTER — Ambulatory Visit: Payer: BLUE CROSS/BLUE SHIELD | Admitting: Obstetrics & Gynecology

## 2015-07-07 ENCOUNTER — Ambulatory Visit: Payer: BLUE CROSS/BLUE SHIELD | Admitting: Obstetrics & Gynecology

## 2015-07-27 ENCOUNTER — Telehealth: Payer: Self-pay | Admitting: Family Medicine

## 2015-07-27 MED ORDER — ZOLPIDEM TARTRATE 10 MG PO TABS: ORAL_TABLET | ORAL | Status: DC

## 2015-07-27 NOTE — Telephone Encounter (Signed)
Medication called to pharmacy. 

## 2015-07-27 NOTE — Telephone Encounter (Signed)
ok 

## 2015-07-27 NOTE — Telephone Encounter (Signed)
Patient has new pharmacy and needs refill on her Lorrin Mais to [facility] [address]  [phone]

## 2015-07-27 NOTE — Telephone Encounter (Signed)
Ok to refill??  Last office visit 06/07/2015.  Last refill 05/04/2015, #2 refills.

## 2015-07-28 ENCOUNTER — Other Ambulatory Visit: Payer: Self-pay | Admitting: Physician Assistant

## 2015-07-28 NOTE — Telephone Encounter (Signed)
Refill appropriate and filled per protocol. 

## 2015-08-04 ENCOUNTER — Other Ambulatory Visit: Payer: BLUE CROSS/BLUE SHIELD

## 2015-08-08 ENCOUNTER — Ambulatory Visit
Admission: RE | Admit: 2015-08-08 | Discharge: 2015-08-08 | Disposition: A | Discharge status: Home / Self Care | Payer: BLUE CROSS/BLUE SHIELD | Source: Ambulatory Visit | Attending: Physician Assistant | Admitting: Physician Assistant

## 2015-08-08 DIAGNOSIS — E2839 Other primary ovarian failure: Secondary | ICD-10-CM

## 2015-08-09 ENCOUNTER — Encounter: Payer: Self-pay | Admitting: Family Medicine

## 2015-08-09 DIAGNOSIS — M8580 Other specified disorders of bone density and structure, unspecified site: Secondary | ICD-10-CM | POA: Insufficient documentation

## 2015-08-17 ENCOUNTER — Other Ambulatory Visit: Payer: Self-pay | Admitting: Physician Assistant

## 2015-08-17 NOTE — Telephone Encounter (Signed)
Refill appropriate and filled per protocol. 

## 2015-08-27 ENCOUNTER — Other Ambulatory Visit: Payer: Self-pay | Admitting: Physician Assistant

## 2015-08-28 ENCOUNTER — Encounter: Payer: Self-pay | Admitting: Family Medicine

## 2015-08-28 NOTE — Telephone Encounter (Signed)
Medication refill for one time only.  Patient needs to be seen.  Letter sent for patient to call and schedule 

## 2015-09-07 ENCOUNTER — Encounter: Payer: Self-pay | Admitting: Gastroenterology

## 2015-09-07 ENCOUNTER — Ambulatory Visit (INDEPENDENT_AMBULATORY_CARE_PROVIDER_SITE_OTHER): Payer: BLUE CROSS/BLUE SHIELD | Admitting: Physician Assistant

## 2015-09-07 ENCOUNTER — Encounter: Payer: Self-pay | Admitting: Physician Assistant

## 2015-09-07 VITALS — BP 106/62 | HR 64 | Temp 98.3°F | Resp 18 | Wt 130.0 lb

## 2015-09-07 DIAGNOSIS — M5136 Other intervertebral disc degeneration, lumbar region: Secondary | ICD-10-CM | POA: Diagnosis not present

## 2015-09-07 DIAGNOSIS — K219 Gastro-esophageal reflux disease without esophagitis: Secondary | ICD-10-CM

## 2015-09-07 DIAGNOSIS — M545 Low back pain: Secondary | ICD-10-CM

## 2015-09-07 DIAGNOSIS — Z1212 Encounter for screening for malignant neoplasm of rectum: Secondary | ICD-10-CM

## 2015-09-07 DIAGNOSIS — M5417 Radiculopathy, lumbosacral region: Secondary | ICD-10-CM

## 2015-09-07 DIAGNOSIS — M5416 Radiculopathy, lumbar region: Secondary | ICD-10-CM | POA: Diagnosis not present

## 2015-09-07 DIAGNOSIS — Z1211 Encounter for screening for malignant neoplasm of colon: Secondary | ICD-10-CM

## 2015-09-07 DIAGNOSIS — I10 Essential (primary) hypertension: Secondary | ICD-10-CM

## 2015-09-07 DIAGNOSIS — M159 Polyosteoarthritis, unspecified: Secondary | ICD-10-CM | POA: Diagnosis not present

## 2015-09-07 DIAGNOSIS — G8929 Other chronic pain: Secondary | ICD-10-CM

## 2015-09-07 DIAGNOSIS — G4700 Insomnia, unspecified: Secondary | ICD-10-CM

## 2015-09-07 DIAGNOSIS — F419 Anxiety disorder, unspecified: Secondary | ICD-10-CM

## 2015-09-07 DIAGNOSIS — M8580 Other specified disorders of bone density and structure, unspecified site: Secondary | ICD-10-CM | POA: Diagnosis not present

## 2015-09-07 DIAGNOSIS — M5126 Other intervertebral disc displacement, lumbar region: Secondary | ICD-10-CM | POA: Diagnosis not present

## 2015-09-07 DIAGNOSIS — M4300 Spondylolysis, site unspecified: Secondary | ICD-10-CM | POA: Diagnosis not present

## 2015-09-07 DIAGNOSIS — G894 Chronic pain syndrome: Secondary | ICD-10-CM

## 2015-09-07 DIAGNOSIS — D259 Leiomyoma of uterus, unspecified: Secondary | ICD-10-CM

## 2015-09-07 LAB — COMPLETE METABOLIC PANEL WITH GFR
ALBUMIN: 4.7 g/dL (ref 3.6–5.1)
ALT: 31 U/L — ABNORMAL HIGH (ref 6–29)
AST: 23 U/L (ref 10–35)
Alkaline Phosphatase: 84 U/L (ref 33–130)
BILIRUBIN TOTAL: 0.4 mg/dL (ref 0.2–1.2)
BUN: 22 mg/dL (ref 7–25)
CALCIUM: 10.3 mg/dL (ref 8.6–10.4)
CO2: 26 mmol/L (ref 20–31)
Chloride: 99 mmol/L (ref 98–110)
Creat: 0.67 mg/dL (ref 0.50–1.05)
GLUCOSE: 105 mg/dL — AB (ref 70–99)
POTASSIUM: 4.2 mmol/L (ref 3.5–5.3)
SODIUM: 137 mmol/L (ref 135–146)
TOTAL PROTEIN: 7.4 g/dL (ref 6.1–8.1)

## 2015-09-07 NOTE — Progress Notes (Signed)
Patient ID: Erika Harvey MRN: [id number], DOB: August 04, 1960, 55 y.o. Date of Encounter: @DATE @  Chief Complaint:  Chief Complaint  Patient presents with  . routine check up    is fasting    HPI: 55 y.o. year old female  Presents for routine f/u OV and labs.   She has no complaints or concerns today. She says that she is still doing very well regarding her back and says that she will probably be able to return to work in May.  She has had back surgery by Dr. Patrice Paradise. She also sees the pain clinic for management of pain medications.  She is taking blood pressure medication as directed with no adverse effects.  Her anxiety is well controlled with current medications.  She has recently seen gynecologist secondary to postmenopausal bleeding. She had biopsy which was negative.  Her GERD symptoms are well controlled with current medication.  Her insomnia is well controlled with the Ambien. This medication is causing no adverse effects.   Past Medical History  Diagnosis Date  . Hypertension   . Anxiety   . Frequent UTI   . Chronic low back pain   . Vaginal bleeding 03/30/2015  . Fibroid 03/30/2015     Home Meds: Outpatient Prescriptions Prior to Visit  Medication Sig Dispense Refill  . ALPRAZolam (XANAX) 0.5 MG tablet TAKE 1 TABLET 3 TIMES A DAY AS NEEDED 90 tablet 2  . aspirin 81 MG tablet Take 81 mg by mouth daily.      Marland Kitchen atenolol (TENORMIN) 25 MG tablet Take 1 tablet (25 mg total) by mouth daily. 30 tablet 3  . baclofen (LIORESAL) 10 MG tablet Take 10 mg by mouth.    . calcium-vitamin D (OSCAL WITH D) 250-125 MG-UNIT per tablet Take 1 tablet by mouth daily.      . celecoxib (CELEBREX) 100 MG capsule TAKE 1 CAPSULE (100 MG TOTAL) BY MOUTH 2 (TWO) TIMES DAILY. 60 capsule 3  . cetirizine (ZYRTEC) 10 MG tablet Take 10 mg by mouth daily.    . citalopram (CELEXA) 40 MG tablet TAKE 1 TABLET BY MOUTH EVERY DAY 90 tablet 1  . esomeprazole (NEXIUM) 40 MG capsule Take 1 capsule  (40 mg total) by mouth daily. 30 capsule 11  . fish oil-omega-3 fatty acids 1000 MG capsule Take 2 g by mouth 2 (two) times daily.      . hydrochlorothiazide (HYDRODIURIL) 25 MG tablet TAKE 1 TABLET BY MOUTH EVERY DAY 90 tablet 0  . HYDROcodone-acetaminophen (NORCO) 10-325 MG per tablet Take 1 tablet by mouth every 4 (four) hours as needed.   0  . losartan (COZAAR) 100 MG tablet TAKE 1 TABLET EVERY DAY 90 tablet 3  . sulfamethoxazole-trimethoprim (BACTRIM DS,SEPTRA DS) 800-160 MG tablet TAKE 1 TABLET BY MOUTH ONCE AFTER INTERCOURSE AS NEEDED 30 tablet 3  . Vitamins-Lipotropics (LIPOFLAVOVIT) TABS Take by mouth.    . zolpidem (AMBIEN) 10 MG tablet TAKE 1 TABLET EVERY DAY AT BEDTIME AS NEEDED 30 tablet 2  . Gabapentin Enacarbil (HORIZANT) 600 MG TBCR Take 600 mg by mouth 2 (two) times daily. Reported on 09/07/2015    . promethazine (PHENERGAN) 25 MG tablet Take 1 tablet (25 mg total) by mouth every 8 (eight) hours as needed for nausea or vomiting. (Patient not taking: Reported on 09/07/2015) 10 tablet 0  . chlorzoxazone (PARAFON) 500 MG tablet Take by mouth 4 (four) times daily as needed for muscle spasms. Reported on 09/07/2015    . ciprofloxacin (CIPRO)  500 MG tablet Take 1 tablet (500 mg total) by mouth 2 (two) times daily. 6 tablet 0  . fluconazole (DIFLUCAN) 150 MG tablet Take 1 tablet (150 mg total) by mouth once. Take the second tablet 3 days after the first one. (Patient not taking: Reported on 04/21/2015) 2 tablet 0  . norgestrel-ethinyl estradiol (CRYSELLE-28) 0.3-30 MG-MCG tablet Take 1 tablet by mouth daily. (Patient not taking: Reported on 06/07/2015) 28 tablet 3  . oxyCODONE-acetaminophen (PERCOCET) 7.5-325 MG tablet Take 1 tablet by mouth every 4 (four) hours as needed for severe pain.    . progesterone (PROMETRIUM) 200 MG capsule Take  One at bedtime (Patient not taking: Reported on 09/07/2015) 30 capsule 11   No facility-administered medications prior to visit.    Allergies:  Allergies    Allergen Reactions  . Norvasc [Amlodipine Besylate] Swelling and Rash    Social History   Social History  . Marital Status: Married    Spouse Name: N/A  . Number of Children: N/A  . Years of Education: N/A   Occupational History  . Not on file.   Social History Main Topics  . Smoking status: Never Smoker   . Smokeless tobacco: Never Used  . Alcohol Use: 3.0 oz/week    5 Cans of beer per week  . Drug Use: No  . Sexual Activity: Yes    Birth Control/ Protection: Pill   Other Topics Concern  . Not on file   Social History Narrative    Family History  Problem Relation Age of Onset  . Cancer Mother 61    Breast Cancer  . Heart disease Father 85  . Diabetes Father      Review of Systems:  See HPI for pertinent ROS. All other ROS negative.    Physical Exam: Blood pressure 106/62, pulse 64, temperature 98.3 F (36.8 C), temperature source Oral, resp. rate 18, weight 130 lb (58.968 kg)., Body mass index is 22.3 kg/(m^2). General: WNWD WF. Appears in no acute distress. Neck: Supple. No thyromegaly. No lymphadenopathy. No carotid bruit. Lungs: Clear bilaterally to auscultation without wheezes, rales, or rhonchi. Breathing is unlabored. Heart: RRR with S1 S2. No murmurs, rubs, or gallops. Abdomen: Soft, non-tender, non-distended with normoactive bowel sounds. No hepatomegaly. No rebound/guarding. No obvious abdominal masses. Musculoskeletal:  Strength and tone normal for age. Extremities/Skin: Warm and dry. Neuro: Alert and oriented X 3. Moves all extremities spontaneously. Gait is normal. CNII-XII grossly in tact. Psych:  Responds to questions appropriately with a normal affect.     ASSESSMENT AND PLAN:  55 y.o. year old female with  1. Essential hypertension Blood pressure is controlled/at goal. Continue current medications. Check lab to monitor. - COMPLETE METABOLIC PANEL WITH GFR  2. Chronic low back pain She underwent surgery by Dr. Rennis Harding. She is still  being managed at the pain clinic as well.  3. Lumbar herniated disc She underwent surgery by Dr. Rennis Harding. She is still being managed at the pain clinic as well.  4. Chronic pain syndrome She underwent surgery by Dr. Rennis Harding. She is still being managed at the pain clinic as well.  5. DDD (degenerative disc disease), lumbar She underwent surgery by Dr. Rennis Harding. She is still being managed at the pain clinic as well.  6. Spondylolysis She underwent surgery by Dr. Rennis Harding. She is still being managed at the pain clinic as well.  7. Lumbosacral radiculitis She underwent surgery by Dr. Rennis Harding. She is still being managed at  the pain clinic as well.  8. Generalized OA Controlled with current meds.  9. Anxiety Stable/controlled with current medication.  10. Osteopenia   11. Uterine leiomyoma, unspecified location This is been managed by GYN.  12. Gastroesophageal reflux disease, esophagitis presence not specified This is stable/controlled with current medication.  13. Insomnia This is well controlled with Ambien in this medicine is causing no adverse effects.  64. Screening for colorectal cancer At the time of her complete physical exam we discussed that she had never had colonoscopy and recommended colonoscopy but she deferred at that time. Today she says that she is agreeable to go ahead and see GI and have this done while she is still out of work secondary to her back surgery. I will order referral now. - Ambulatory referral to Gastroenterology   Signed, Olean Ree Trumann, [place], [facility] 09/07/2015 10:52 AM

## 2015-09-08 ENCOUNTER — Encounter: Payer: Self-pay | Admitting: Family Medicine

## 2015-09-13 ENCOUNTER — Other Ambulatory Visit: Payer: Self-pay | Admitting: Physician Assistant

## 2015-09-13 ENCOUNTER — Other Ambulatory Visit: Payer: Self-pay | Admitting: Family Medicine

## 2015-09-13 NOTE — Telephone Encounter (Signed)
Medication refilled per protocol. 

## 2015-09-15 ENCOUNTER — Telehealth: Payer: Self-pay | Admitting: Family Medicine

## 2015-09-15 MED ORDER — PROMETHAZINE HCL 25 MG PO TABS: 25.0000 mg | ORAL_TABLET | Freq: Three times a day (TID) | ORAL | Status: DC | PRN

## 2015-09-15 NOTE — Telephone Encounter (Signed)
Patient would like refill on her phenergan  She says she needs it today  [phone] [facility]

## 2015-09-15 NOTE — Telephone Encounter (Signed)
Medication called/sent to requested pharmacy  

## 2015-09-18 ENCOUNTER — Other Ambulatory Visit: Payer: Self-pay | Admitting: Physician Assistant

## 2015-09-18 NOTE — Telephone Encounter (Signed)
Approved. # [id number] 2. 

## 2015-09-18 NOTE — Telephone Encounter (Signed)
rx called in

## 2015-09-18 NOTE — Telephone Encounter (Signed)
LRF 06/27/15 #90 + 2   LOV 09/07/15  OK refill?

## 2015-10-06 ENCOUNTER — Ambulatory Visit (AMBULATORY_SURGERY_CENTER): Payer: Self-pay | Admitting: *Deleted

## 2015-10-06 VITALS — Ht 64.0 in | Wt 137.0 lb

## 2015-10-06 DIAGNOSIS — Z1211 Encounter for screening for malignant neoplasm of colon: Secondary | ICD-10-CM

## 2015-10-06 NOTE — Progress Notes (Signed)
No egg or soy allergy known to patient  No issues with past sedation with any surgeries  or procedures, no intubation problems  No diet pills per patient No home 02 use per patient  No blood thinners per patient  Pt denies issues with constipation  emmi video to e mail   [email or web address]

## 2015-10-13 ENCOUNTER — Other Ambulatory Visit: Payer: Self-pay | Admitting: Family Medicine

## 2015-10-13 MED ORDER — PROMETHAZINE HCL 25 MG PO TABS: 25.0000 mg | ORAL_TABLET | Freq: Three times a day (TID) | ORAL | Status: DC | PRN

## 2015-10-13 NOTE — Telephone Encounter (Signed)
ok 

## 2015-10-13 NOTE — Telephone Encounter (Signed)
Pt needs a refill of Promethazine called in to the [facility] in [place].

## 2015-10-13 NOTE — Telephone Encounter (Signed)
Medication called/sent to requested pharmacy  

## 2015-10-16 ENCOUNTER — Encounter: Payer: Self-pay | Admitting: Physician Assistant

## 2015-10-16 ENCOUNTER — Other Ambulatory Visit: Payer: Self-pay | Admitting: Family Medicine

## 2015-10-16 ENCOUNTER — Ambulatory Visit (INDEPENDENT_AMBULATORY_CARE_PROVIDER_SITE_OTHER): Payer: BLUE CROSS/BLUE SHIELD | Admitting: Physician Assistant

## 2015-10-16 VITALS — BP 108/70 | HR 76 | Temp 98.4°F | Resp 18 | Wt 129.0 lb

## 2015-10-16 DIAGNOSIS — R630 Anorexia: Secondary | ICD-10-CM

## 2015-10-16 DIAGNOSIS — R110 Nausea: Secondary | ICD-10-CM | POA: Diagnosis not present

## 2015-10-16 LAB — URINALYSIS, MICROSCOPIC ONLY
CASTS: NONE SEEN [LPF]
Crystals: NONE SEEN [HPF]
YEAST: NONE SEEN [HPF]

## 2015-10-16 LAB — URINALYSIS, ROUTINE W REFLEX MICROSCOPIC
Bilirubin Urine: NEGATIVE
GLUCOSE, UA: NEGATIVE
NITRITE: POSITIVE — AB
PH: 7 (ref 5.0–8.0)
Specific Gravity, Urine: 1.02 (ref 1.001–1.035)

## 2015-10-16 MED ORDER — CIPROFLOXACIN HCL 500 MG PO TABS: 500.0000 mg | ORAL_TABLET | Freq: Two times a day (BID) | ORAL | Status: DC

## 2015-10-16 MED ORDER — ONDANSETRON HCL 8 MG PO TABS: 8.0000 mg | ORAL_TABLET | Freq: Three times a day (TID) | ORAL | Status: DC | PRN

## 2015-10-16 NOTE — Progress Notes (Signed)
Patient ID: ADRAIN KELL MRN: [id number], DOB: 1961/01/08, 55 y.o. Date of Encounter: @DATE @  Chief Complaint:  Chief Complaint  Patient presents with  . persistant nausea    not eating much, feeling weak    HPI: 55 y.o. year old white female  presents with above.  She states that she has been having these symptoms for over a week. Says that she was trying to wean off of baclofen and hydrocodone.  Says that she had just abruptly stopped the baclofen-- but then she had even worse symptoms than what she is having now-- so she went back to taking 2 baclofen. Then she had routine appt at [facility]--she informed them that she was feeling very nauseous and shaky when she abruptly stopped the baclofen--and that she had gone to taking 2 per day.   Says that she was taking baclofen 10 mg 4 times a day since at least November. Currently taking 10 mg twice daily. Says that she was taking hydrocodone 10 mg and was taking it 6 times per day and is now taking it 4 times per day.  Says that she got off the gabapentin in February.  Says that she absolutely hates to vomit so she stops herself from vomiting but feels very close to vomiting. Says that her BMs are the same as usual--- no looser than usual.  no diarrhea. No abdominal pain-- just nauseous feeling.  States that Friday she called our office and we called in Phenergan. Says that she has to take it as frequently as possible and even then it barely helps.  Reviewed her medication list. She states that she is taking the Nexium daily. States that she has been on this dose of hormone patch at this dose for a while so this should not be causing current symptoms.  States that she is not having pain and at this point from a pain perspective she wants to just totally quit the baclofen and hydrocodone except for withdrawal symptoms. Says that all along even when taking these medications and adjusting medications she has had issues with  nausea and anorexia.  Has had no fevers or chills. No dysuria. No right upper quadrant pain. No area of abdominal pain.   Past Medical History  Diagnosis Date  . Hypertension   . Anxiety   . Frequent UTI   . Chronic low back pain   . Vaginal bleeding 03/30/2015  . Fibroid 03/30/2015  . Allergy      Home Meds: Outpatient Prescriptions Prior to Visit  Medication Sig Dispense Refill  . ALPRAZolam (XANAX) 0.5 MG tablet TAKE 1 TABLET BY MOUTH THREE TIMES DAILY AS NEEDED 90 tablet 2  . aspirin 81 MG tablet Take 81 mg by mouth daily.      Marland Kitchen atenolol (TENORMIN) 25 MG tablet TAKE 1 TABLET BY MOUTH EVERY DAY 90 tablet 1  . baclofen (LIORESAL) 10 MG tablet Take 10 mg by mouth.    . bisacodyl (DULCOLAX) 5 MG EC tablet Take 5 mg by mouth once. 4-28 colon x 4    . calcium-vitamin D (OSCAL WITH D) 250-125 MG-UNIT per tablet Take 1 tablet by mouth daily.      . celecoxib (CELEBREX) 100 MG capsule TAKE 1 CAPSULE (100 MG TOTAL) BY MOUTH 2 (TWO) TIMES DAILY. 60 capsule 3  . cetirizine (ZYRTEC) 10 MG tablet Take 10 mg by mouth daily.    . citalopram (CELEXA) 40 MG tablet TAKE 1 TABLET BY MOUTH EVERY DAY 90  tablet 1  . COMBIPATCH 0.05-0.25 MG/DAY APPLY 1 PATCH AS DIRECTED TWO TIMES A WEEK  1  . esomeprazole (NEXIUM) 40 MG capsule Take 1 capsule (40 mg total) by mouth daily. 30 capsule 11  . fish oil-omega-3 fatty acids 1000 MG capsule Take 2 g by mouth 2 (two) times daily.      . hydrochlorothiazide (HYDRODIURIL) 25 MG tablet TAKE 1 TABLET BY MOUTH EVERY DAY 90 tablet 0  . HYDROcodone-acetaminophen (NORCO) 10-325 MG per tablet Take 1 tablet by mouth every 4 (four) hours as needed.   0  . losartan (COZAAR) 100 MG tablet TAKE 1 TABLET EVERY DAY 90 tablet 3  . polyethylene glycol powder (MIRALAX) powder Take 1 Container by mouth once. 238 grams for colon 4-28    . promethazine (PHENERGAN) 25 MG tablet Take 1 tablet (25 mg total) by mouth every 8 (eight) hours as needed for nausea or vomiting. 10 tablet 0    . sulfamethoxazole-trimethoprim (BACTRIM DS,SEPTRA DS) 800-160 MG tablet TAKE 1 TABLET BY MOUTH ONCE AFTER INTERCOURSE AS NEEDED 30 tablet 3  . Vitamins-Lipotropics (LIPOFLAVOVIT) TABS Take by mouth.    . zolpidem (AMBIEN) 10 MG tablet TAKE 1 TABLET EVERY DAY AT BEDTIME AS NEEDED 30 tablet 2   No facility-administered medications prior to visit.    Allergies:  Allergies  Allergen Reactions  . Norvasc [Amlodipine Besylate] Swelling and Rash    Social History   Social History  . Marital Status: Married    Spouse Name: N/A  . Number of Children: N/A  . Years of Education: N/A   Occupational History  . Not on file.   Social History Main Topics  . Smoking status: Never Smoker   . Smokeless tobacco: Never Used  . Alcohol Use: 3.0 oz/week    5 Cans of beer per week  . Drug Use: No  . Sexual Activity: Yes    Birth Control/ Protection: Pill   Other Topics Concern  . Not on file   Social History Narrative    Family History  Problem Relation Age of Onset  . Cancer Mother 21    Breast Cancer  . Heart disease Father 97  . Diabetes Father   . Colon cancer Neg Hx   . Colon polyps Neg Hx      Review of Systems:  See HPI for pertinent ROS. All other ROS negative.    Physical Exam: Blood pressure 108/70, pulse 76, temperature 98.4 F (36.9 C), temperature source Oral, resp. rate 18, weight 129 lb (58.514 kg)., Body mass index is 22.13 kg/(m^2). General: WNWD WF. Appears in no acute distress. Neck: Supple. No thyromegaly. No lymphadenopathy. Lungs: Clear bilaterally to auscultation without wheezes, rales, or rhonchi. Breathing is unlabored. Heart: RRR with S1 S2. No murmurs, rubs, or gallops. Abdomen: Soft, non-tender, non-distended with normoactive bowel sounds. No hepatomegaly. No rebound/guarding. No obvious abdominal masses. When I palpate left upper quadrant and epigastric region, she says this makes her feel very nauseaus and that area is a little tender. No other  area of tenderness with palpation. No tenderness with palpation of RUQ or lower abdomen. Musculoskeletal:  Strength and tone normal for age. Extremities/Skin: Warm and dry. Neuro: Alert and oriented X 3. Moves all extremities spontaneously. Gait is normal. CNII-XII grossly in tact. Psych:  Responds to questions appropriately with a normal affect.     ASSESSMENT AND PLAN:  55 y.o. year old female with  1. Nausea without vomiting - Amylase - Lipase - CBC with  Differential/Platelet - COMPLETE METABOLIC PANEL WITH GFR - TSH - Urinalysis, Routine w reflex microscopic (not at [facility]) - Urine culture - H. pylori breath test - ondansetron (ZOFRAN) 8 MG tablet; Take 1 tablet (8 mg total) by mouth every 8 (eight) hours as needed for nausea or vomiting.  Dispense: 30 tablet; Refill: 1  2. Anorexia - Amylase - Lipase - CBC with Differential/Platelet - COMPLETE METABOLIC PANEL WITH GFR - TSH - Urinalysis, Routine w reflex microscopic (not at [facility]) - Urine culture - H. pylori breath test - ondansetron (ZOFRAN) 8 MG tablet; Take 1 tablet (8 mg total) by mouth every 8 (eight) hours as needed for nausea or vomiting.  Dispense: 30 tablet; Refill: 1  She says that she is out of Phenergan. Try Zofran just to see if this may work better for her. Will obtain the above labs. If these are normal then she will contact the pain clinic to get their input to see if they think that the symptoms are secondary to weaning off of meds and get their assistance in weaning off meds. If symptoms persist despite this then will get GI referral. She is to continue Nexium. She is having no gnawing epigastric pain to suggest ulcer.   Signed, [address] [place], [place], [facility] 10/16/2015 9:56 AM

## 2015-10-17 LAB — CBC WITH DIFFERENTIAL/PLATELET
BASOS PCT: 0 %
Basophils Absolute: 0 cells/uL (ref 0–200)
EOS PCT: 0 %
Eosinophils Absolute: 0 cells/uL — ABNORMAL LOW (ref 15–500)
HEMATOCRIT: 40.1 % (ref 35.0–45.0)
Hemoglobin: 13.2 g/dL (ref 12.0–15.0)
LYMPHS ABS: 1134 {cells}/uL (ref 850–3900)
Lymphocytes Relative: 14 %
MCH: 33.5 pg — ABNORMAL HIGH (ref 27.0–33.0)
MCHC: 32.9 g/dL (ref 32.0–36.0)
MCV: 101.8 fL — ABNORMAL HIGH (ref 80.0–100.0)
MONO ABS: 891 {cells}/uL (ref 200–950)
MPV: 9.3 fL (ref 7.5–12.5)
Monocytes Relative: 11 %
NEUTROS PCT: 75 %
Neutro Abs: 6075 cells/uL (ref 1500–7800)
Platelets: 422 10*3/uL — ABNORMAL HIGH (ref 140–400)
RBC: 3.94 MIL/uL (ref 3.80–5.10)
RDW: 12.4 % (ref 11.0–15.0)
WBC: 8.1 10*3/uL (ref 3.8–10.8)

## 2015-10-17 LAB — LIPASE: LIPASE: 46 U/L (ref 7–60)

## 2015-10-17 LAB — COMPLETE METABOLIC PANEL WITH GFR
ALT: 21 U/L (ref 6–29)
AST: 22 U/L (ref 10–35)
Albumin: 5.2 g/dL — ABNORMAL HIGH (ref 3.6–5.1)
Alkaline Phosphatase: 74 U/L (ref 33–130)
BUN: 15 mg/dL (ref 7–25)
CHLORIDE: 92 mmol/L — AB (ref 98–110)
CO2: 23 mmol/L (ref 20–31)
CREATININE: 1.2 mg/dL — AB (ref 0.50–1.05)
Calcium: 9.9 mg/dL (ref 8.6–10.4)
GFR, Est African American: 59 mL/min — ABNORMAL LOW (ref >=60)
GFR, Est Non African American: 51 mL/min — ABNORMAL LOW (ref >=60)
GLUCOSE: 96 mg/dL (ref 70–99)
Potassium: 5.1 mmol/L (ref 3.5–5.3)
SODIUM: 130 mmol/L — AB (ref 135–146)
Total Bilirubin: 0.5 mg/dL (ref 0.2–1.2)
Total Protein: 7.9 g/dL (ref 6.1–8.1)

## 2015-10-17 LAB — H. PYLORI BREATH TEST: H. pylori Breath Test: NOT DETECTED

## 2015-10-17 LAB — TSH: TSH: 1.46 mIU/L

## 2015-10-17 LAB — AMYLASE: Amylase: 34 U/L (ref 0–105)

## 2015-10-19 ENCOUNTER — Other Ambulatory Visit: Payer: Self-pay | Admitting: Family Medicine

## 2015-10-19 LAB — URINE CULTURE: Colony Count: >=100000

## 2015-10-19 MED ORDER — NITROFURANTOIN MONOHYD MACRO 100 MG PO CAPS: 100.0000 mg | ORAL_CAPSULE | Freq: Two times a day (BID) | ORAL | Status: DC

## 2015-10-24 ENCOUNTER — Other Ambulatory Visit: Payer: Self-pay | Admitting: Family Medicine

## 2015-10-24 ENCOUNTER — Other Ambulatory Visit: Payer: BLUE CROSS/BLUE SHIELD

## 2015-10-24 DIAGNOSIS — R899 Unspecified abnormal finding in specimens from other organs, systems and tissues: Secondary | ICD-10-CM

## 2015-10-24 LAB — COMPLETE METABOLIC PANEL WITH GFR
ALT: 13 U/L (ref 6–29)
AST: 18 U/L (ref 10–35)
Albumin: 4.4 g/dL (ref 3.6–5.1)
Alkaline Phosphatase: 56 U/L (ref 33–130)
BILIRUBIN TOTAL: 0.2 mg/dL (ref 0.2–1.2)
BUN: 25 mg/dL (ref 7–25)
CALCIUM: 9.5 mg/dL (ref 8.6–10.4)
CO2: 27 mmol/L (ref 20–31)
CREATININE: 0.95 mg/dL (ref 0.50–1.05)
Chloride: 100 mmol/L (ref 98–110)
GFR, EST AFRICAN AMERICAN: 78 mL/min (ref >=60)
GFR, Est Non African American: 68 mL/min (ref >=60)
Glucose, Bld: 98 mg/dL (ref 70–99)
Potassium: 3.8 mmol/L (ref 3.5–5.3)
Sodium: 138 mmol/L (ref 135–146)
TOTAL PROTEIN: 6.5 g/dL (ref 6.1–8.1)

## 2015-10-24 NOTE — Telephone Encounter (Signed)
Ok to refill??  Last office visit 10/16/2015.  Last refill 07/27/2015, #2 refills.

## 2015-10-24 NOTE — Telephone Encounter (Signed)
Approved. #30+2. 

## 2015-10-25 NOTE — Telephone Encounter (Signed)
Rx called in 

## 2015-10-30 ENCOUNTER — Other Ambulatory Visit: Payer: Self-pay | Admitting: Physician Assistant

## 2015-10-30 NOTE — Telephone Encounter (Signed)
Medication refilled per protocol. 

## 2015-11-03 ENCOUNTER — Ambulatory Visit (AMBULATORY_SURGERY_CENTER): Payer: BLUE CROSS/BLUE SHIELD | Admitting: Gastroenterology

## 2015-11-03 ENCOUNTER — Encounter: Payer: Self-pay | Admitting: Gastroenterology

## 2015-11-03 VITALS — BP 110/63 | HR 69 | Temp 98.0°F | Resp 16 | Ht 64.0 in | Wt 137.0 lb

## 2015-11-03 DIAGNOSIS — Z1211 Encounter for screening for malignant neoplasm of colon: Secondary | ICD-10-CM | POA: Diagnosis not present

## 2015-11-03 MED ORDER — SODIUM CHLORIDE 0.9 % IV SOLN: 500.0000 mL | INTRAVENOUS | Status: DC

## 2015-11-03 NOTE — Progress Notes (Signed)
No problems noted in the recovery room. maw 

## 2015-11-03 NOTE — Progress Notes (Signed)
Report to PACU, RN, vss, BBS= Clear.  

## 2015-11-03 NOTE — Op Note (Signed)
[place] Patient Name: Erika Harvey Procedure Date: 11/03/2015 8:50 AM MRN: [id number] Endoscopist: [place]. Loletha Carrow , MD Age: 55 Date of Birth: 1961-03-22 Gender: Female Procedure:                Colonoscopy Indications:              Screening for colorectal malignant neoplasm Medicines:                Monitored Anesthesia Care Procedure:                Pre-Anesthesia Assessment:                           - Prior to the procedure, a History and Physical                            was performed, and patient medications and                            allergies were reviewed. The patient's tolerance of                            previous anesthesia was also reviewed. The risks                            and benefits of the procedure and the sedation                            options and risks were discussed with the patient.                            All questions were answered, and informed consent                            was obtained. Prior Anticoagulants: The patient has                            taken no previous anticoagulant or antiplatelet                            agents. ASA Grade Assessment: II - A patient with                            mild systemic disease. After reviewing the risks                            and benefits, the patient was deemed in                            satisfactory condition to undergo the procedure.                           After obtaining informed consent, the colonoscope  was passed under direct vision. Throughout the                            procedure, the patient's blood pressure, pulse, and                            oxygen saturations were monitored continuously. The                            Model CF-HQ190L [id number] [id number]) scope was introduced                            through the anus and advanced to the the cecum,                            identified by appendiceal orifice and ileocecal                         valve. The colonoscopy was performed without                            difficulty. The patient tolerated the procedure                            well. The quality of the bowel preparation was                            good. The ileocecal valve, appendiceal orifice, and                            rectum were photographed. Scope In: 9:02:52 AM Scope Out: 9:17:32 AM Scope Withdrawal Time: 0 hours 9 minutes 44 seconds  Total Procedure Duration: 0 hours 14 minutes 40 seconds  Findings:                 The perianal and digital rectal examinations were                            normal.                           The entire examined colon appeared normal on direct                            and retroflexion views. Complications:            No immediate complications. Estimated Blood Loss:     Estimated blood loss: none. Impression:               - The entire examined colon is normal on direct and                            retroflexion views.                           - No specimens collected. Recommendation:           -  Patient has a contact number available for                            emergencies. The signs and symptoms of potential                            delayed complications were discussed with the                            patient. Return to normal activities tomorrow.                            Written discharge instructions were provided to the                            patient.                           - Resume previous diet.                           - Continue present medications.                           - Repeat colonoscopy in 10 years for screening                            purposes. Erika Harvey L. Loletha Carrow, MD 11/03/2015 9:21:00 AM This report has been signed electronically.

## 2015-11-03 NOTE — Patient Instructions (Signed)
YOU HAD AN ENDOSCOPIC PROCEDURE TODAY AT THE [facility]:   Refer to the procedure report that was given to you for any specific questions about what was found during the examination.  If the procedure report does not answer your questions, please call your gastroenterologist to clarify.  If you requested that your care partner not be given the details of your procedure findings, then the procedure report has been included in a sealed envelope for you to review at your convenience later.  YOU SHOULD EXPECT: Some feelings of bloating in the abdomen. Passage of more gas than usual.  Walking can help get rid of the air that was put into your GI tract during the procedure and reduce the bloating. If you had a lower endoscopy (such as a colonoscopy or flexible sigmoidoscopy) you may notice spotting of blood in your stool or on the toilet paper. If you underwent a bowel prep for your procedure, you may not have a normal bowel movement for a few days.  Please Note:  You might notice some irritation and congestion in your nose or some drainage.  This is from the oxygen used during your procedure.  There is no need for concern and it should clear up in a day or so.  SYMPTOMS TO REPORT IMMEDIATELY:   Following lower endoscopy (colonoscopy or flexible sigmoidoscopy):  Excessive amounts of blood in the stool  Significant tenderness or worsening of abdominal pains  Swelling of the abdomen that is new, acute  Fever of 100F or higher   For urgent or emergent issues, a gastroenterologist can be reached at any hour by calling [phone].   DIET: Your first meal following the procedure should be a small meal and then it is ok to progress to your normal diet. Heavy or fried foods are harder to digest and may make you feel nauseous or bloated.  Likewise, meals heavy in dairy and vegetables can increase bloating.  Drink plenty of fluids but you should avoid alcoholic beverages for 24  hours.  ACTIVITY:  You should plan to take it easy for the rest of today and you should NOT DRIVE or use heavy machinery until tomorrow (because of the sedation medicines used during the test).    FOLLOW UP: Our staff will call the number listed on your records the next business day following your procedure to check on you and address any questions or concerns that you may have regarding the information given to you following your procedure. If we do not reach you, we will leave a message.  However, if you are feeling well and you are not experiencing any problems, there is no need to return our call.  We will assume that you have returned to your regular daily activities without incident.  If any biopsies were taken you will be contacted by phone or by letter within the next 1-3 weeks.  Please call us at [phone] if you have not heard about the biopsies in 3 weeks.    SIGNATURES/CONFIDENTIALITY: You and/or your care partner have signed paperwork which will be entered into your electronic medical record.  These signatures attest to the fact that that the information above on your After Visit Summary has been reviewed and is understood.  Full responsibility of the confidentiality of this discharge information lies with you and/or your care-partner.    You may resume your current medications today. Please call if any questions or concerns. 

## 2015-11-06 ENCOUNTER — Encounter: Payer: Self-pay | Admitting: Physician Assistant

## 2015-11-06 ENCOUNTER — Ambulatory Visit (INDEPENDENT_AMBULATORY_CARE_PROVIDER_SITE_OTHER): Payer: BLUE CROSS/BLUE SHIELD | Admitting: Physician Assistant

## 2015-11-06 ENCOUNTER — Telehealth: Payer: Self-pay

## 2015-11-06 VITALS — BP 112/70 | HR 72 | Temp 98.3°F | Resp 18 | Wt 133.0 lb

## 2015-11-06 DIAGNOSIS — R630 Anorexia: Secondary | ICD-10-CM

## 2015-11-06 DIAGNOSIS — R110 Nausea: Secondary | ICD-10-CM | POA: Diagnosis not present

## 2015-11-06 DIAGNOSIS — N390 Urinary tract infection, site not specified: Secondary | ICD-10-CM | POA: Diagnosis not present

## 2015-11-06 LAB — URINALYSIS, ROUTINE W REFLEX MICROSCOPIC
Bilirubin Urine: NEGATIVE
Glucose, UA: NEGATIVE
HGB URINE DIPSTICK: NEGATIVE
KETONES UR: NEGATIVE
Leukocytes, UA: NEGATIVE
NITRITE: NEGATIVE
PROTEIN: NEGATIVE
Specific Gravity, Urine: 1.015 (ref 1.001–1.035)
pH: 6.5 (ref 5.0–8.0)

## 2015-11-06 NOTE — Telephone Encounter (Signed)
  Follow up Call-  Call back number 11/03/2015  Post procedure Call Back phone  # [phone]  Permission to leave phone message Yes     Patient questions:  Do you have a fever, pain , or abdominal swelling? No. Pain Score  0 *  Have you tolerated food without any problems? Yes.    Have you been able to return to your normal activities? Yes.    Do you have any questions about your discharge instructions: Diet   No. Medications  No. Follow up visit  No.  Do you have questions or concerns about your Care? No.  Actions: * If pain score is 4 or above: No action needed, pain <4.  No problems per the pt. maw

## 2015-11-06 NOTE — Progress Notes (Signed)
Patient ID: Erika Harvey MRN: [id number], DOB: 1961-05-20, 55 y.o. Date of Encounter: 11/06/2015, 12:50 PM    Chief Complaint:  Chief Complaint  Patient presents with  . believes UTI has returned     HPI: 55 y.o. year old female presents with above.   **--See OV NOTE 10/16/2015**----  Today pt says that those symptoms resolved after she completed abx for UTI.   She says this past Saturday (11/04/15) she started to develop some nausea and decreased appetite.  Says that day (Saturday) started taking Bactrim BID. Has conted taking it BID.   Says she is now off Gabapentin, Hydrocodone, Baclofen.   Doesn't know what else would be causing nausea and decreased appetite at this point (See OV note 10/16/15)     Home Meds:   Outpatient Prescriptions Prior to Visit  Medication Sig Dispense Refill  . ALPRAZolam (XANAX) 0.5 MG tablet TAKE 1 TABLET BY MOUTH THREE TIMES DAILY AS NEEDED 90 tablet 2  . AMRIX 15 MG 24 hr capsule Reported on 10/16/2015  1  . aspirin 81 MG tablet Take 81 mg by mouth daily.      Marland Kitchen atenolol (TENORMIN) 25 MG tablet TAKE 1 TABLET BY MOUTH EVERY DAY 90 tablet 1  . calcium-vitamin D (OSCAL WITH D) 250-125 MG-UNIT per tablet Take 1 tablet by mouth daily.      . celecoxib (CELEBREX) 100 MG capsule TAKE 1 CAPSULE (100 MG TOTAL) BY MOUTH 2 (TWO) TIMES DAILY. 60 capsule 3  . cetirizine (ZYRTEC) 10 MG tablet Take 10 mg by mouth daily.    . citalopram (CELEXA) 40 MG tablet TAKE 1 TABLET BY MOUTH EVERY DAY 90 tablet 1  . COMBIPATCH 0.05-0.25 MG/DAY APPLY 1 PATCH AS DIRECTED TWO TIMES A WEEK  1  . esomeprazole (NEXIUM) 40 MG capsule TAKE 1 CAPSULE (40 MG TOTAL) BY MOUTH DAILY. 90 capsule 1  . fish oil-omega-3 fatty acids 1000 MG capsule Take 2 g by mouth 2 (two) times daily.      . hydrochlorothiazide (HYDRODIURIL) 25 MG tablet TAKE 1 TABLET BY MOUTH EVERY DAY 90 tablet 0  . losartan (COZAAR) 100 MG tablet TAKE 1 TABLET EVERY DAY 90 tablet 3  . Vitamins-Lipotropics  (LIPOFLAVOVIT) TABS Take by mouth.    . zolpidem (AMBIEN) 10 MG tablet TAKE 1 TABLET AT BEDTIME AS NEEDED FOR INSOMNIA 30 tablet 2  . HYDROcodone-acetaminophen (NORCO) 10-325 MG per tablet Take 1 tablet by mouth every 4 (four) hours as needed. Reported on 11/06/2015  0  . ondansetron (ZOFRAN) 8 MG tablet Take 1 tablet (8 mg total) by mouth every 8 (eight) hours as needed for nausea or vomiting. (Patient not taking: Reported on 11/06/2015) 30 tablet 1  . promethazine (PHENERGAN) 25 MG tablet Take 1 tablet (25 mg total) by mouth every 8 (eight) hours as needed for nausea or vomiting. (Patient not taking: Reported on 11/03/2015) 10 tablet 0  . sulfamethoxazole-trimethoprim (BACTRIM DS,SEPTRA DS) 800-160 MG tablet TAKE 1 TABLET BY MOUTH ONCE AFTER INTERCOURSE AS NEEDED (Patient not taking: Reported on 11/06/2015) 30 tablet 3   No facility-administered medications prior to visit.    Allergies:  Allergies  Allergen Reactions  . Norvasc [Amlodipine Besylate] Swelling and Rash      Review of Systems: See HPI for pertinent ROS. All other ROS negative.    Physical Exam: Blood pressure 112/70, pulse 72, temperature 98.3 F (36.8 C), temperature source Oral, resp. rate 18, weight 133 lb (60.328 kg)., Body mass index  is 22.82 kg/(m^2). General:  WNWD WF. Appears in no acute distress. Neck: Supple. No thyromegaly. No lymphadenopathy. Lungs: Clear bilaterally to auscultation without wheezes, rales, or rhonchi. Breathing is unlabored. Heart: Regular rhythm. No murmurs, rubs, or gallops. Abdomen: Soft, non-tender, non-distended with normoactive bowel sounds. No hepatomegaly. No rebound/guarding. No obvious abdominal masses. Msk:  Strength and tone normal for age. Extremities/Skin: Warm and dry. Neuro: Alert and oriented X 3. Moves all extremities spontaneously. Gait is normal. CNII-XII grossly in tact. Psych:  Responds to questions appropriately with a normal affect.   Results for orders placed or performed  in visit on 11/06/15  Urinalysis, Routine w reflex microscopic (not at [facility])  Result Value Ref Range   Color, Urine YELLOW YELLOW   APPearance CLEAR CLEAR   Specific Gravity, Urine 1.015 1.001 - 1.035   pH 6.5 5.0 - 8.0   Glucose, UA NEGATIVE NEGATIVE   Bilirubin Urine NEGATIVE NEGATIVE   Ketones, ur NEGATIVE NEGATIVE   Hgb urine dipstick NEGATIVE NEGATIVE   Protein, ur NEGATIVE NEGATIVE   Nitrite NEGATIVE NEGATIVE   Leukocytes, UA NEGATIVE NEGATIVE     ASSESSMENT AND PLAN:  55 y.o. year old female with  1. Recurrent UTI - Urinalysis, Routine w reflex microscopic (not at [facility]) - Urine culture - Urine culture  2. Nausea without vomiting  3. Anorexia  Discussed with pt that UA shows no indication of UTI.  It is possible that the Bactrim is treating UTI so that UA appearing normal.  Will send culture.  She is to cont Bactrim--take BID for 5 days.  She is to f/u with me if nausea, anorexia do not resolve.  --See OV note 10/16/2015 for other history and labs recently checked.--   Signed, [address] [place], [place], BSFM 11/06/2015 12:50 PM

## 2015-11-07 LAB — URINE CULTURE

## 2015-11-08 ENCOUNTER — Telehealth: Payer: Self-pay | Admitting: Physician Assistant

## 2015-11-08 MED ORDER — PROMETHAZINE HCL 25 MG PO TABS: 25.0000 mg | ORAL_TABLET | Freq: Three times a day (TID) | ORAL | Status: DC | PRN

## 2015-11-08 NOTE — Telephone Encounter (Signed)
Patient is calling to say that she would like phenergan instead of what was prescribed last for her, it does not work as well  [facility]  [phone] if any questions

## 2015-11-08 NOTE — Telephone Encounter (Signed)
Phenergan refilled, pt aware

## 2015-11-09 ENCOUNTER — Telehealth: Payer: Self-pay | Admitting: Physician Assistant

## 2015-11-09 NOTE — Telephone Encounter (Signed)
Pt is requesting the results from her UA. [phone]

## 2015-11-10 ENCOUNTER — Other Ambulatory Visit: Payer: Self-pay | Admitting: Family Medicine

## 2015-11-10 DIAGNOSIS — N6320 Unspecified lump in the left breast, unspecified quadrant: Secondary | ICD-10-CM

## 2015-11-10 NOTE — Telephone Encounter (Signed)
Pt called and told culture was negative as well

## 2015-11-15 ENCOUNTER — Other Ambulatory Visit: Payer: BLUE CROSS/BLUE SHIELD

## 2015-12-13 ENCOUNTER — Other Ambulatory Visit: Payer: Self-pay | Admitting: Physician Assistant

## 2015-12-13 NOTE — Telephone Encounter (Signed)
Okay to refill? 

## 2015-12-13 NOTE — Telephone Encounter (Signed)
Xanax refill request.  Last seen 11/06/2015,  Last filled 09/18/2015 with 2 refills.  Please advise.

## 2015-12-13 NOTE — Telephone Encounter (Signed)
Xanax called into [facility] 

## 2015-12-15 ENCOUNTER — Other Ambulatory Visit: Payer: Self-pay | Admitting: Physician Assistant

## 2015-12-18 NOTE — Telephone Encounter (Signed)
Medication refilled per protocol. 

## 2016-01-10 ENCOUNTER — Other Ambulatory Visit: Payer: Self-pay | Admitting: Family Medicine

## 2016-01-10 NOTE — Telephone Encounter (Signed)
Ok to refill??        LOV - 11/06/15 UTI - 09/07/15 - CPE LRF - 12/13/15

## 2016-01-10 NOTE — Telephone Encounter (Signed)
Approved. # [id number] 2. 

## 2016-01-11 NOTE — Telephone Encounter (Signed)
rx called in

## 2016-01-12 ENCOUNTER — Other Ambulatory Visit: Payer: Self-pay | Admitting: Physician Assistant

## 2016-01-12 NOTE — Telephone Encounter (Signed)
OK refill this?

## 2016-01-13 NOTE — Telephone Encounter (Signed)
Approved. #30+2. 

## 2016-01-15 NOTE — Telephone Encounter (Signed)
Medication refilled per protocol. 

## 2016-01-22 ENCOUNTER — Other Ambulatory Visit: Payer: Self-pay | Admitting: Physician Assistant

## 2016-01-23 NOTE — Telephone Encounter (Signed)
LRF 10/25/15 #30 + 2   LOV 11/06/15  OK refill?

## 2016-01-24 ENCOUNTER — Telehealth: Payer: Self-pay | Admitting: Physician Assistant

## 2016-01-24 NOTE — Telephone Encounter (Signed)
Approved. #30+2. 

## 2016-01-24 NOTE — Telephone Encounter (Signed)
Hi   Is there any way we can [facility] [address]? It was printed out

## 2016-01-24 NOTE — Telephone Encounter (Signed)
Please send to that pharmacy.

## 2016-01-24 NOTE — Telephone Encounter (Signed)
[facility]ed [facility]  [phone]    Orvin Netter RMA

## 2016-01-29 ENCOUNTER — Encounter: Payer: Self-pay | Admitting: Family Medicine

## 2016-01-29 ENCOUNTER — Ambulatory Visit (INDEPENDENT_AMBULATORY_CARE_PROVIDER_SITE_OTHER): Payer: BLUE CROSS/BLUE SHIELD | Admitting: Family Medicine

## 2016-01-29 VITALS — BP 144/92 | HR 78 | Temp 98.7°F | Resp 16 | Ht 64.0 in | Wt 142.0 lb

## 2016-01-29 DIAGNOSIS — S83412A Sprain of medial collateral ligament of left knee, initial encounter: Secondary | ICD-10-CM

## 2016-01-29 DIAGNOSIS — M25562 Pain in left knee: Secondary | ICD-10-CM

## 2016-01-29 MED ORDER — TRAMADOL HCL 50 MG PO TABS: 100.0000 mg | ORAL_TABLET | Freq: Three times a day (TID) | ORAL | 0 refills | Status: DC | PRN

## 2016-01-29 NOTE — Progress Notes (Signed)
Subjective:    Patient ID: Erika Harvey, female    DOB: [birth year], 55 y.o.   MRN: [id number]  HPI 2 ago, the patient slipped in the kitchen. Her left knee when completely sideways possibly tearing her MCL. She now has instability in the knee joint. She has severe pain over the medial joint line and lateral joint line. She has pain with ambulation. She has instability and pain with valgus stress on the left knee. She also has a positive Apley grind Past Medical History:  Diagnosis Date  . Allergy   . Anxiety   . Chronic low back pain   . Fibroid 03/30/2015  . Frequent UTI   . Hypertension   . Vaginal bleeding 03/30/2015   Past Surgical History:  Procedure Laterality Date  . BACK SURGERY  05-16-2015  . KNEE SURGERY     left  . KNEE SURGERY Left 07/08/2012  . WISDOM TOOTH EXTRACTION      Current Outpatient Prescriptions on File Prior to Visit  Medication Sig Dispense Refill  . ALPRAZolam (XANAX) 0.5 MG tablet TAKE 1 TABLET BY MOUTH THREE TIMES DAILY 90 tablet 2  . AMRIX 15 MG 24 hr capsule Reported on 10/16/2015  1  . aspirin 81 MG tablet Take 81 mg by mouth daily.      Marland Kitchen atenolol (TENORMIN) 25 MG tablet TAKE 1 TABLET BY MOUTH EVERY DAY 90 tablet 1  . calcium-vitamin D (OSCAL WITH D) 250-125 MG-UNIT per tablet Take 1 tablet by mouth daily.      . celecoxib (CELEBREX) 100 MG capsule TAKE 1 CAPSULE (100 MG TOTAL) BY MOUTH 2 (TWO) TIMES DAILY. 60 capsule 3  . cetirizine (ZYRTEC) 10 MG tablet Take 10 mg by mouth daily.    . citalopram (CELEXA) 40 MG tablet TAKE 1 TABLET BY MOUTH EVERY DAY 90 tablet 1  . COMBIPATCH 0.05-0.25 MG/DAY APPLY 1 PATCH AS DIRECTED TWO TIMES A WEEK  1  . esomeprazole (NEXIUM) 40 MG capsule TAKE 1 CAPSULE (40 MG TOTAL) BY MOUTH DAILY. 90 capsule 1  . fish oil-omega-3 fatty acids 1000 MG capsule Take 2 g by mouth 2 (two) times daily.      . hydrochlorothiazide (HYDRODIURIL) 25 MG tablet TAKE 1 TABLET BY MOUTH EVERY DAY 90 tablet 0  . losartan (COZAAR) 100 MG  tablet TAKE 1 TABLET EVERY DAY 90 tablet 3  . ondansetron (ZOFRAN) 8 MG tablet Take 1 tablet (8 mg total) by mouth every 8 (eight) hours as needed for nausea or vomiting. 30 tablet 1  . promethazine (PHENERGAN) 25 MG tablet Take 1 tablet (25 mg total) by mouth every 8 (eight) hours as needed for nausea or vomiting. 20 tablet 0  . sulfamethoxazole-trimethoprim (BACTRIM DS,SEPTRA DS) 800-160 MG tablet TAKE 1 TABLET BY MOUTH ONCE AFTER INTERCOURSE AS NEEDED 30 tablet 2  . Vitamins-Lipotropics (LIPOFLAVOVIT) TABS Take by mouth.    . zolpidem (AMBIEN) 10 MG tablet TAKE 1 TABLET AT BEDTIME AS NEEDED 30 tablet 2   No current facility-administered medications on file prior to visit.    Allergies  Allergen Reactions  . Norvasc [Amlodipine Besylate] Swelling and Rash   Social History   Social History  . Marital status: Married    Spouse name: N/A  . Number of children: N/A  . Years of education: N/A   Occupational History  . Not on file.   Social History Main Topics  . Smoking status: Never Smoker  . Smokeless tobacco: Never Used  . Alcohol  use 3.0 oz/week    5 Cans of beer per week  . Drug use: No  . Sexual activity: Yes    Birth control/ protection: Pill   Other Topics Concern  . Not on file   Social History Narrative  . No narrative on file     Review of Systems  All other systems reviewed and are negative.      Objective:   Physical Exam  Musculoskeletal:       Left knee: She exhibits decreased range of motion and abnormal meniscus. Tenderness found. Medial joint line, lateral joint line and MCL tenderness noted.          Assessment & Plan:  Left medial knee pain - Plan: DG Knee Complete 4 Views Left, MR Knee Left  Wo Contrast  Tear of MCL (medial collateral ligament) of knee, left, initial encounter - Plan: MR Knee Left  Wo Contrast  I spent the patient is toward her left MCL and also possibly her left meniscus. I will obtain an x-ray today to rule out occult  fracture is scheduled patient for an MRI. I will give her tramadol 50-100 mg every 6 hours as needed for pain

## 2016-02-05 ENCOUNTER — Other Ambulatory Visit: Payer: Self-pay | Admitting: Family Medicine

## 2016-02-07 ENCOUNTER — Other Ambulatory Visit: Payer: BLUE CROSS/BLUE SHIELD

## 2016-02-08 ENCOUNTER — Other Ambulatory Visit: Payer: Self-pay | Admitting: Physician Assistant

## 2016-02-08 NOTE — Telephone Encounter (Signed)
Refill appropriate and filled per protocol. 

## 2016-02-19 ENCOUNTER — Inpatient Hospital Stay: Admission: RE | Admit: 2016-02-19 | Payer: BLUE CROSS/BLUE SHIELD | Source: Ambulatory Visit

## 2016-03-12 ENCOUNTER — Other Ambulatory Visit: Payer: Self-pay | Admitting: Physician Assistant

## 2016-03-12 ENCOUNTER — Other Ambulatory Visit: Payer: Self-pay | Admitting: Family Medicine

## 2016-03-23 ENCOUNTER — Other Ambulatory Visit: Payer: Self-pay | Admitting: Physician Assistant

## 2016-03-26 ENCOUNTER — Telehealth: Payer: Self-pay | Admitting: *Deleted

## 2016-03-26 NOTE — Telephone Encounter (Signed)
Rx sent per protocol 

## 2016-03-26 NOTE — Telephone Encounter (Signed)
Patient called and states she needs a refill of her Hydrochlorothiazide. Her pharmacy is [facility] in [place]. Please advise. Thank you

## 2016-04-04 ENCOUNTER — Other Ambulatory Visit: Payer: Self-pay | Admitting: Physician Assistant

## 2016-04-04 NOTE — Telephone Encounter (Signed)
Aproved. #90+2.

## 2016-04-04 NOTE — Telephone Encounter (Signed)
Ok to refill??  Last office visit 01/29/2016.  Last refill 01/11/2016, #2 refills.

## 2016-04-05 NOTE — Telephone Encounter (Signed)
Rx phoned in.   

## 2016-04-09 ENCOUNTER — Encounter: Payer: Self-pay | Admitting: Family Medicine

## 2016-04-09 ENCOUNTER — Ambulatory Visit (INDEPENDENT_AMBULATORY_CARE_PROVIDER_SITE_OTHER): Payer: BLUE CROSS/BLUE SHIELD | Admitting: Family Medicine

## 2016-04-09 VITALS — BP 100/60 | HR 82 | Temp 97.9°F | Resp 16 | Ht 64.0 in | Wt 140.0 lb

## 2016-04-09 DIAGNOSIS — F321 Major depressive disorder, single episode, moderate: Secondary | ICD-10-CM | POA: Diagnosis not present

## 2016-04-09 NOTE — Progress Notes (Signed)
Subjective:    Patient ID: Erika Harvey, female    DOB: Aug 09, 1960, 55 y.o.   MRN: [id number]  HPI Over the last 2 years, the patient has been dealing with chronic low back pain and has even been out of work on short-term disability during and after her surgery. During that time period, the patient has been unable to manage her family's finances. As a result her credit cards are maxed out. Her credit score has fallen dramatically. The patient also states that her job may be in jeopardy. As a result of the mounting financial pressures and work pressures, she has developed depression. She also reports decreased energy, poor concentration, intrusive thoughts, panic attacks, trouble sleeping, anhedonia. She also complains of depression. She is having uses Xanax 3 times a day to help control her anxiety. She is meeting with a credit counseling agency today to try to help consolidate her debt and 12 manageable payments. However emotionally she feels unable to cope and is finding it difficult to get her life back on track Past Medical History:  Diagnosis Date  . Allergy   . Anxiety   . Chronic low back pain   . Fibroid 03/30/2015  . Frequent UTI   . Hypertension   . Vaginal bleeding 03/30/2015   Past Surgical History:  Procedure Laterality Date  . BACK SURGERY  05-16-2015  . KNEE SURGERY     left  . KNEE SURGERY Left 07/08/2012  . WISDOM TOOTH EXTRACTION     Current Outpatient Prescriptions on File Prior to Visit  Medication Sig Dispense Refill  . ALPRAZolam (XANAX) 0.5 MG tablet TAKE 1 TABLET BY MOUTH THREE TIMES DAILY 90 tablet 2  . aspirin 81 MG tablet Take 81 mg by mouth daily.      Marland Kitchen atenolol (TENORMIN) 25 MG tablet TAKE 1 TABLET BY MOUTH EVERY DAY 90 tablet 1  . calcium-vitamin D (OSCAL WITH D) 250-125 MG-UNIT per tablet Take 1 tablet by mouth daily.      . celecoxib (CELEBREX) 100 MG capsule TAKE 1 CAPSULE (100 MG TOTAL) BY MOUTH 2 (TWO) TIMES DAILY. 60 capsule 3  . cetirizine  (ZYRTEC) 10 MG tablet Take 10 mg by mouth daily.    . citalopram (CELEXA) 40 MG tablet TAKE 1 TABLET BY MOUTH EVERY DAY 90 tablet 1  . COMBIPATCH 0.05-0.25 MG/DAY APPLY 1 PATCH AS DIRECTED TWO TIMES A WEEK  1  . esomeprazole (NEXIUM) 40 MG capsule TAKE 1 CAPSULE (40 MG TOTAL) BY MOUTH DAILY. 90 capsule 1  . fish oil-omega-3 fatty acids 1000 MG capsule Take 2 g by mouth 2 (two) times daily.      . hydrochlorothiazide (HYDRODIURIL) 25 MG tablet TAKE 1 TABLET BY MOUTH EVERY DAY 90 tablet 0  . losartan (COZAAR) 100 MG tablet TAKE 1 TABLET EVERY DAY 90 tablet 3  . ondansetron (ZOFRAN) 8 MG tablet Take 1 tablet (8 mg total) by mouth every 8 (eight) hours as needed for nausea or vomiting. 30 tablet 1  . promethazine (PHENERGAN) 25 MG tablet TAKE 1 TABLET (25 MG TOTAL) BY MOUTH EVERY 8 (EIGHT) HOURS AS NEEDED FOR NAUSEA OR VOMITING. 20 tablet 0  . Vitamins-Lipotropics (LIPOFLAVOVIT) TABS Take by mouth.    . zolpidem (AMBIEN) 10 MG tablet TAKE 1 TABLET AT BEDTIME AS NEEDED 30 tablet 2   No current facility-administered medications on file prior to visit.    Allergies  Allergen Reactions  . Norvasc [Amlodipine Besylate] Swelling and Rash   Social  History   Social History  . Marital status: Married    Spouse name: N/A  . Number of children: N/A  . Years of education: N/A   Occupational History  . Not on file.   Social History Main Topics  . Smoking status: Never Smoker  . Smokeless tobacco: Never Used  . Alcohol use 3.0 oz/week    5 Cans of beer per week  . Drug use: No  . Sexual activity: Yes    Birth control/ protection: Pill   Other Topics Concern  . Not on file   Social History Narrative  . No narrative on file      Review of Systems  All other systems reviewed and are negative.      Objective:   Physical Exam  Cardiovascular: Normal rate, regular rhythm and normal heart sounds.   Pulmonary/Chest: Effort normal and breath sounds normal.  Vitals reviewed.  Very  little exam was performed this morning 20 minutes was spent with the patient in conversation and evaluation       Assessment & Plan:  Moderate single current episode of major depressive disorder (HCC)  Decrease citalopram to 20 mg a day for 1 week and then stop. Meanwhile begin Trintelix at 5 mg poqday and increase to 10 mg poqday in 1 week. Recheck in 4 weeks.

## 2016-04-11 ENCOUNTER — Other Ambulatory Visit: Payer: Self-pay | Admitting: Physician Assistant

## 2016-04-15 ENCOUNTER — Telehealth: Payer: Self-pay | Admitting: Physician Assistant

## 2016-04-15 NOTE — Telephone Encounter (Signed)
Patient called in [facility] a [facility] [address] Trintellix 10mg  she states this is helping her. She is worried about her job so if we could please go ahead and call this in. She states that Dr. Dennard Schaumann gave her samples of 5 mg also last week but would like [place] to go ahead and call in 10mg .  CB# [phone]

## 2016-04-16 ENCOUNTER — Other Ambulatory Visit: Payer: Self-pay | Admitting: Physician Assistant

## 2016-04-16 MED ORDER — VORTIOXETINE HBR 10 MG PO TABS: 10.0000 mg | ORAL_TABLET | Freq: Every day | ORAL | 5 refills | Status: DC

## 2016-04-16 NOTE — Telephone Encounter (Signed)
Medication called/sent to requested pharmacy  

## 2016-04-23 ENCOUNTER — Other Ambulatory Visit: Payer: Self-pay | Admitting: Physician Assistant

## 2016-04-23 NOTE — Telephone Encounter (Signed)
Rx not filled still has refills

## 2016-04-24 ENCOUNTER — Telehealth: Payer: Self-pay

## 2016-04-24 NOTE — Telephone Encounter (Signed)
Last OV 10-3 Last refill 7-19 Ok to refill?

## 2016-04-24 NOTE — Telephone Encounter (Signed)
Approved. #30+2. 

## 2016-04-24 NOTE — Telephone Encounter (Signed)
Rx filled

## 2016-04-24 NOTE — Telephone Encounter (Signed)
Pt called about RX being filled, tried calling pt to let her know rx was fax

## 2016-05-07 ENCOUNTER — Telehealth: Payer: Self-pay | Admitting: Family Medicine

## 2016-05-07 NOTE — Telephone Encounter (Signed)
Pt was just put on Trintellix.  Now just lost her job.  Is afraid she will not be able to afford this when refill due.  Please advise less expensive alternative.

## 2016-05-07 NOTE — Telephone Encounter (Signed)
Switch trintellix to zoloft 50 mg poqhs and recheck in 4 weeks.

## 2016-05-08 MED ORDER — SERTRALINE HCL 50 MG PO TABS: 50.0000 mg | ORAL_TABLET | Freq: Every day | ORAL | 0 refills | Status: DC

## 2016-05-08 NOTE — Telephone Encounter (Signed)
New Rx sent in

## 2016-05-29 ENCOUNTER — Telehealth: Payer: Self-pay

## 2016-05-29 NOTE — Telephone Encounter (Signed)
Received Prior Authorization from [facility] Trintellix  10mg  tabs was rejected  The insurance will cover  Bupropion HCI Fluoxetine HCI Escitalopram oxalate Sertraline HCI  Previous Rx for Sertraline HCI  Was filled 11-1

## 2016-06-06 ENCOUNTER — Other Ambulatory Visit: Payer: Self-pay | Admitting: Family Medicine

## 2016-06-17 ENCOUNTER — Other Ambulatory Visit: Payer: Self-pay | Admitting: Physician Assistant

## 2016-06-18 ENCOUNTER — Other Ambulatory Visit: Payer: Self-pay | Admitting: Physician Assistant

## 2016-06-19 ENCOUNTER — Encounter: Payer: Self-pay | Admitting: Physician Assistant

## 2016-06-19 ENCOUNTER — Ambulatory Visit (INDEPENDENT_AMBULATORY_CARE_PROVIDER_SITE_OTHER): Payer: 59 | Admitting: Physician Assistant

## 2016-06-19 VITALS — BP 104/62 | HR 71 | Temp 97.5°F | Resp 16 | Wt 150.0 lb

## 2016-06-19 DIAGNOSIS — M5126 Other intervertebral disc displacement, lumbar region: Secondary | ICD-10-CM

## 2016-06-19 DIAGNOSIS — G4700 Insomnia, unspecified: Secondary | ICD-10-CM

## 2016-06-19 DIAGNOSIS — F320 Major depressive disorder, single episode, mild: Secondary | ICD-10-CM

## 2016-06-19 DIAGNOSIS — F419 Anxiety disorder, unspecified: Secondary | ICD-10-CM

## 2016-06-19 DIAGNOSIS — I10 Essential (primary) hypertension: Secondary | ICD-10-CM

## 2016-06-19 LAB — BASIC METABOLIC PANEL WITH GFR
BUN: 20 mg/dL (ref 7–25)
CO2: 22 mmol/L (ref 20–31)
Calcium: 9.6 mg/dL (ref 8.6–10.4)
Chloride: 103 mmol/L (ref 98–110)
Creat: 1 mg/dL (ref 0.50–1.05)
GFR, EST AFRICAN AMERICAN: 73 mL/min (ref >=60)
GFR, Est Non African American: 63 mL/min (ref >=60)
GLUCOSE: 102 mg/dL — AB (ref 70–99)
POTASSIUM: 3.9 mmol/L (ref 3.5–5.3)
Sodium: 136 mmol/L (ref 135–146)

## 2016-06-19 MED ORDER — SERTRALINE HCL 100 MG PO TABS: 100.0000 mg | ORAL_TABLET | Freq: Every day | ORAL | 3 refills | Status: DC

## 2016-06-19 NOTE — Progress Notes (Signed)
Patient ID: Erika Harvey MRN: [id number], DOB: 1961/05/12, 55 y.o. Date of Encounter: @DATE @  Chief Complaint:  Chief Complaint  Patient presents with  . medication recheck    HPI: 55 y.o. year old female  presents for f/u OV to f/u Depression and HTN.   Over the past 2 years she had been dealing with back pain. Ultimately underwent back surgery. Finally has gotten relief of the back pain. She was on short-term disability for a time during all of that and after her surgery. She worked with [employer]. Also at home she was the one that did all of their finances and took care of all of that at home.  During the time that she was requiring a lot of pain meds etc. and was feeling pain nausea and not feeling well she was unable to manage the family's finances. She came and had a visit with Dr. Dennard Schaumann 04/09/16. At that time reported that their credit cards were maxed out. Their credit score had fallen dramatically. At that time reported that her job may be in jeopardy. As a result of the mounting financial pressures and work pressures she had developed depression. At that visit she reported decreased energy, poor concentration, intrusive thoughts, panic attacks, trouble sleeping, anhedenia. Complained of depression. Was using Xanax 3 times a day to help control the anxiety. Was meeting with an agency later that day to help consolidate debt into 12 manageable payments. At that time stated that emotionally she felt unable to cope and was finding it difficult to get her life back on track.  At that visit Dr. Dennard Schaumann planned to wean off of Celexa and meanwhile start Trintelix 5 mg for one week and then increase to 10 mg in one week.  However on 05/07/16 patient called and reported that she had lost her job and would not be able to afford the Trentelex once refill due. Therefore was needing a less expensive alternative.  At that time Dr. Dennard Schaumann switched her to Zoloft 50 mg.  Today patient  states that when she started the Zoloft she could tell some difference and did feel like it was helping some. However only occasionally she "feels like her old self." Today she reports that she was laid off. She says that actually she thinks that her job was very surprised when she did return to work at all. Says that they had already gotten other employees to cover her job duties and then when she returned there really was minimal work for her to do. Today she reports that she went ahead and decided to use her 401(k).  Had to use all of her 401(k). When talking with her --in addition to her own emotions-- she also feels that she has let down her husband and has now put him in his bad position because she is the one that was responsible for the home finances and he didn't realize that during that period of time she was unable to take care of things and manage things. Says that now he totally understands the entire situation and everything is out in the open and clear and has been dealt with but still she feels this responsibility/burden. Says that she has updated her resume and is trying to find a new job. Says that generally she was always happy-go-lucky type personality and just doesn't feel that way now. Says every now and then she will laugh and feel that way and will tell her husband--"[name], did you notice that" but  says that only lasts a fleeting moment and then she is back to feeling depressed. Says she knows in her mind that everything is now cleared and taken care of and that everything will be okay but she just cannot get herself to feel better.  She is still taking all of her blood pressure medications as directed. Is having no adverse effects. No lightheadedness.  Past Medical History:  Diagnosis Date  . Allergy   . Anxiety   . Chronic low back pain   . Fibroid 03/30/2015  . Frequent UTI   . Hypertension   . Vaginal bleeding 03/30/2015     Home Meds: Outpatient Medications Prior to  Visit  Medication Sig Dispense Refill  . ALPRAZolam (XANAX) 0.5 MG tablet TAKE 1 TABLET BY MOUTH THREE TIMES DAILY 90 tablet 2  . aspirin 81 MG tablet Take 81 mg by mouth daily.      Marland Kitchen atenolol (TENORMIN) 25 MG tablet TAKE 1 TABLET BY MOUTH EVERY DAY 90 tablet 1  . calcium-vitamin D (OSCAL WITH D) 250-125 MG-UNIT per tablet Take 1 tablet by mouth daily.      . celecoxib (CELEBREX) 100 MG capsule TAKE 1 CAPSULE (100 MG TOTAL) BY MOUTH 2 (TWO) TIMES DAILY. 60 capsule 3  . cetirizine (ZYRTEC) 10 MG tablet Take 10 mg by mouth daily.    . COMBIPATCH 0.05-0.25 MG/DAY APPLY 1 PATCH AS DIRECTED TWO TIMES A WEEK  1  . esomeprazole (NEXIUM) 40 MG capsule TAKE 1 CAPSULE (40 MG TOTAL) BY MOUTH DAILY. 90 capsule 1  . fish oil-omega-3 fatty acids 1000 MG capsule Take 2 g by mouth 2 (two) times daily.      . hydrochlorothiazide (HYDRODIURIL) 25 MG tablet TAKE 1 TABLET BY MOUTH EVERY DAY 90 tablet 0  . losartan (COZAAR) 100 MG tablet TAKE 1 TABLET EVERY DAY 90 tablet 3  . ondansetron (ZOFRAN) 8 MG tablet Take 1 tablet (8 mg total) by mouth every 8 (eight) hours as needed for nausea or vomiting. 30 tablet 1  . promethazine (PHENERGAN) 25 MG tablet TAKE 1 TABLET (25 MG TOTAL) BY MOUTH EVERY 8 (EIGHT) HOURS AS NEEDED FOR NAUSEA OR VOMITING. 20 tablet 0  . Vitamins-Lipotropics (LIPOFLAVOVIT) TABS Take by mouth.    . zolpidem (AMBIEN) 10 MG tablet TAKE 1 TABLET AT BEDTIME AS NEEDED 30 tablet 2  . sertraline (ZOLOFT) 50 MG tablet TAKE 1 TABLET (50 MG TOTAL) BY MOUTH AT BEDTIME. 30 tablet 0  . citalopram (CELEXA) 40 MG tablet TAKE 1 TABLET BY MOUTH EVERY DAY (Patient not taking: Reported on 06/19/2016) 90 tablet 1  . sulfamethoxazole-trimethoprim (BACTRIM DS,SEPTRA DS) 800-160 MG tablet TAKE 1 TABLET BY MOUTH ONCE AFTER INTERCOURSE AS NEEDED (Patient not taking: Reported on 06/19/2016) 30 tablet 2   No facility-administered medications prior to visit.     Allergies:  Allergies  Allergen Reactions  . Norvasc  [Amlodipine Besylate] Swelling and Rash    Social History   Social History  . Marital status: Married    Spouse name: N/A  . Number of children: N/A  . Years of education: N/A   Occupational History  . Not on file.   Social History Main Topics  . Smoking status: Never Smoker  . Smokeless tobacco: Never Used  . Alcohol use 3.0 oz/week    5 Cans of beer per week  . Drug use: No  . Sexual activity: Yes    Birth control/ protection: Pill   Other Topics Concern  . Not  on file   Social History Narrative  . No narrative on file    Family History  Problem Relation Age of Onset  . Cancer Mother 55    Breast Cancer  . Heart disease Father 36  . Diabetes Father   . Colon cancer Neg Hx   . Colon polyps Neg Hx      Review of Systems:  See HPI for pertinent ROS. All other ROS negative.    Physical Exam: Blood pressure 104/62, pulse 71, temperature 97.5 F (36.4 C), temperature source Oral, resp. rate 16, weight 150 lb (68 kg), SpO2 99 %., Body mass index is 25.75 kg/m. General: WNWD WF. Appears in no acute distress. Neck: Supple. No thyromegaly. No lymphadenopathy. No carotid bruits. Lungs: Clear bilaterally to auscultation without wheezes, rales, or rhonchi. Breathing is unlabored. Heart: RRR with S1 S2. No murmurs, rubs, or gallops. Musculoskeletal:  Strength and tone normal for age. Extremities/Skin: Warm and dry.  No edema. Neuro: Alert and oriented X 3. Moves all extremities spontaneously. Gait is normal. CNII-XII grossly in tact. Psych:  Responds to questions appropriately with a normal affect.     ASSESSMENT AND PLAN:  55 y.o. year old female with  1. Mild single current episode of major depressive disorder (HCC) Will increase dose of Zoloft to 100 mg. Will have her return for follow-up office visit 6 weeks. F/U sooner if she is feeling worse or medicine starts to cause any adverse effects. - sertraline (ZOLOFT) 100 MG tablet; Take 1 tablet (100 mg total) by  mouth daily.  Dispense: 30 tablet; Refill: 3  2. Essential hypertension Blood Pressure is at goal/controlled. Continue current medicines. Check lab to monitor. - BASIC METABOLIC PANEL WITH GFR  3. Lumbar herniated disc S/P  surgery. Pain finally resolved.  4. Anxiety She has Xanax available to use to help control this. As well and increasing Zoloft dose.  5. Insomnia, unspecified type She has Xanax available to use to help control this. As well and increasing Zoloft dose.   Follow-up office visit 6 weeks. Sooner if needed.   [address] [place], [place], [facility] 06/19/2016 9:29 AM

## 2016-06-27 ENCOUNTER — Other Ambulatory Visit: Payer: Self-pay | Admitting: Family Medicine

## 2016-06-27 ENCOUNTER — Other Ambulatory Visit: Payer: Self-pay | Admitting: Physician Assistant

## 2016-06-27 ENCOUNTER — Telehealth: Payer: Self-pay | Admitting: Physician Assistant

## 2016-06-27 NOTE — Telephone Encounter (Signed)
Approved. Phenergan 25 mg 1 by mouth every 6 hours PRN nausea #30+0.

## 2016-06-27 NOTE — Telephone Encounter (Signed)
See note below and advise. 

## 2016-06-27 NOTE — Telephone Encounter (Signed)
Patient would like to know if she can have a nausea med calling in for her, she said she feels like she is gonna need it [phone]

## 2016-06-28 MED ORDER — PROMETHAZINE HCL 25 MG PO TABS: 25.0000 mg | ORAL_TABLET | Freq: Four times a day (QID) | ORAL | 0 refills | Status: DC | PRN

## 2016-06-28 NOTE — Telephone Encounter (Signed)
Rx filled

## 2016-07-02 ENCOUNTER — Other Ambulatory Visit: Payer: Self-pay | Admitting: Physician Assistant

## 2016-07-03 NOTE — Telephone Encounter (Signed)
rx called in

## 2016-07-03 NOTE — Telephone Encounter (Signed)
Approved. # [id number] 2. 

## 2016-07-03 NOTE — Telephone Encounter (Signed)
Last OV 12-13 Last refill 9-29 Ok to refill?

## 2016-07-16 ENCOUNTER — Telehealth: Payer: Self-pay

## 2016-07-16 NOTE — Telephone Encounter (Signed)
Pharmacy sent a fax Esomeprazole 40mg  no longer covered on pt ins.  Alternatives omeprazole,pantoprazole,rabeprazole and dexilant. Pls advise

## 2016-07-17 MED ORDER — OMEPRAZOLE 20 MG PO CPDR: 20.0000 mg | DELAYED_RELEASE_CAPSULE | Freq: Every day | ORAL | 3 refills | Status: DC

## 2016-07-17 NOTE — Telephone Encounter (Signed)
rx filled

## 2016-07-17 NOTE — Telephone Encounter (Signed)
Omeprazole 20 mg 1 by mouth daily #90+3.

## 2016-07-22 ENCOUNTER — Other Ambulatory Visit: Payer: Self-pay | Admitting: Physician Assistant

## 2016-07-22 ENCOUNTER — Encounter: Payer: Self-pay | Admitting: Physician Assistant

## 2016-07-22 NOTE — Telephone Encounter (Signed)
Ok to refill 

## 2016-07-23 NOTE — Telephone Encounter (Signed)
rx called in

## 2016-07-23 NOTE — Telephone Encounter (Signed)
Approved. #30+2. 

## 2016-07-26 ENCOUNTER — Telehealth: Payer: Self-pay

## 2016-07-26 NOTE — Telephone Encounter (Signed)
She had been on Celexa in past but recently this was not controlling her depression/anxiety. Now--on Zoloft 100mg ---tell pt this is a fairly high dose and that if she is not getting much benefit at this dose, would not expect her to get much additional benefit from a higher dose.  Recommend she come off this and start Cymbalta. Wean off Zoloft--go down to 50mg  for 2 weeks then start the Cymbalta. Cymbalta 60mg  1 po QD # 30 + 2 Zoloft 50mg  1 po QD # 30 + 0 Tell her to make appt for OV for 8 weeks from now.

## 2016-07-26 NOTE — Telephone Encounter (Signed)
Pt called and is req a increase on her zoloft right now she is at 100mg . Pt states she is still depressed, having a  hard time finding a job and is not motivated to do anything.  Explained to pt it would be best for her to make an appt to sit down and discuss what is going on. Pt states she would only tell you the same info as before

## 2016-07-26 NOTE — Telephone Encounter (Signed)
Spoke with pt informed per that zoloft 100 mg was the highest dose per [facility] and that pt needed to sch an appt to be seen to discuss other options.   Pt states she has a high deductible and will call back if she decides to be seen.

## 2016-07-29 ENCOUNTER — Ambulatory Visit: Payer: 59 | Admitting: Physician Assistant

## 2016-07-31 ENCOUNTER — Telehealth: Payer: Self-pay

## 2016-07-31 MED ORDER — PROMETHAZINE HCL 25 MG PO TABS: 25.0000 mg | ORAL_TABLET | Freq: Three times a day (TID) | ORAL | 0 refills | Status: DC | PRN

## 2016-07-31 NOTE — Telephone Encounter (Signed)
Rx filled per protocol pt has appt to be seen on  1-25

## 2016-08-01 ENCOUNTER — Ambulatory Visit (INDEPENDENT_AMBULATORY_CARE_PROVIDER_SITE_OTHER): Payer: 59 | Admitting: Physician Assistant

## 2016-08-01 ENCOUNTER — Encounter: Payer: Self-pay | Admitting: Physician Assistant

## 2016-08-01 VITALS — BP 102/70 | HR 77 | Temp 97.6°F | Resp 16 | Wt 140.4 lb

## 2016-08-01 DIAGNOSIS — F419 Anxiety disorder, unspecified: Secondary | ICD-10-CM

## 2016-08-01 DIAGNOSIS — F320 Major depressive disorder, single episode, mild: Secondary | ICD-10-CM

## 2016-08-01 MED ORDER — SERTRALINE HCL 50 MG PO TABS: 50.0000 mg | ORAL_TABLET | Freq: Every day | ORAL | 1 refills | Status: DC

## 2016-08-01 MED ORDER — DULOXETINE HCL 60 MG PO CPEP: 60.0000 mg | ORAL_CAPSULE | Freq: Every day | ORAL | 1 refills | Status: DC

## 2016-08-01 NOTE — Progress Notes (Signed)
Patient ID: Erika Harvey MRN: [id number], DOB: [birth year], 56 y.o. Date of Encounter: @DATE @  Chief Complaint:  Chief Complaint  Patient presents with  . F/U Depression    HPI: 56 y.o. year old female  presents for f/u OV to f/u Depression.    06/19/2016: Over the past 2 years she had been dealing with back pain. Ultimately underwent back surgery. Finally has gotten relief of the back pain. She was on short-term disability for a time during all of that and after her surgery. She worked with [employer]. Also at home she was the one that did all of their finances and took care of all of that at home.  During the time that she was requiring a lot of pain meds etc. and was feeling pain nausea and not feeling well she was unable to manage the family's finances. She came and had a visit with Dr. Dennard Schaumann 04/09/16. At that time reported that their credit cards were maxed out. Their credit score had fallen dramatically. At that time reported that her job may be in jeopardy. As a result of the mounting financial pressures and work pressures she had developed depression. At that visit she reported decreased energy, poor concentration, intrusive thoughts, panic attacks, trouble sleeping, anhedenia. Complained of depression. Was using Xanax 3 times a day to help control the anxiety. Was meeting with an agency later that day to help consolidate debt into 12 manageable payments. At that time stated that emotionally she felt unable to cope and was finding it difficult to get her life back on track.  At that visit Dr. Dennard Schaumann planned to wean off of Celexa and meanwhile start Trintelix 5 mg for one week and then increase to 10 mg in one week.  However on 05/07/16 patient called and reported that she had lost her job and would not be able to afford the Trentelex once refill due. Therefore was needing a less expensive alternative.  At that time Dr. Dennard Schaumann switched her to Zoloft 50 mg.  Today patient  states that when she started the Zoloft she could tell some difference and did feel like it was helping some. However only occasionally she "feels like her old self." Today she reports that she was laid off. She says that actually she thinks that her job was very surprised when she did return to work at all. Says that they had already gotten other employees to cover her job duties and then when she returned there really was minimal work for her to do. Today she reports that she went ahead and decided to use her 401(k).  Had to use all of her 401(k). When talking with her --in addition to her own emotions-- she also feels that she has let down her husband and has now put him in his bad position because she is the one that was responsible for the home finances and he didn't realize that during that period of time she was unable to take care of things and manage things. Says that now he totally understands the entire situation and everything is out in the open and clear and has been dealt with but still she feels this responsibility/burden. Says that she has updated her resume and is trying to find a new job. Says that generally she was always happy-go-lucky type personality and just doesn't feel that way now. Says every now and then she will laugh and feel that way and will tell her husband--"[name], did you notice that" but  says that only lasts a fleeting moment and then she is back to feeling depressed. Says she knows in her mind that everything is now cleared and taken care of and that everything will be okay but she just cannot get herself to feel better.  She is still taking all of her blood pressure medications as directed. Is having no adverse effects. No lightheadedness.  AT THAT OV --- Increased Zoloft to 100mg  QD   08/01/2016: Reviewed her chart. After her last visit she also had phone call message on 07/26/16 which I have reviewed. However she states that at the time of that phone call she was  just told to schedule an office visit and was not told about weaning off of Zoloft and starting Cymbalta.  She had called at that time to report that she had been on the Zoloft 100 mg. She reported that she was still feeling depressed, still having a hard time finding a job, still not feeling motivated to do anything. Here for follow-up visit to discuss further treatment options.  Confirmed with her today that the only medications that she has been on for this so far: Celexa--- which she was on for years up until recently 34--- was just recently started and then increased up to the 100 mg dose   Today she states that she still does not have a job. Is that she has had 3 interviews. Is that she is now thinking that she is just going to have to get another job that is not have to do with mortgages. Is that she has even used recruiters and that the market for employment and will mortgages is just not great right now. She's gonna have to just get a different type of job even though it will not pay as well.  Says "On top of it all,a tree fell in the yard and fell across/on the fence-- and they can't afford to get it removed right now."  That she is not crying as much as she was in the past but definitely still feels depressed and does not feel that the Zoloft is doing anything.  Past Medical History:  Diagnosis Date  . Allergy   . Anxiety   . Chronic low back pain   . Fibroid 03/30/2015  . Frequent UTI   . Hypertension   . Vaginal bleeding 03/30/2015     Home Meds: Outpatient Medications Prior to Visit  Medication Sig Dispense Refill  . ALPRAZolam (XANAX) 0.5 MG tablet TAKE 1 TABLET BY MOUTH THREE TIMES DAILY 90 tablet 2  . aspirin 81 MG tablet Take 81 mg by mouth daily.      Marland Kitchen atenolol (TENORMIN) 25 MG tablet TAKE 1 TABLET BY MOUTH EVERY DAY 90 tablet 1  . calcium-vitamin D (OSCAL WITH D) 250-125 MG-UNIT per tablet Take 1 tablet by mouth daily.      . celecoxib (CELEBREX) 100 MG capsule  TAKE 1 CAPSULE (100 MG TOTAL) BY MOUTH 2 (TWO) TIMES DAILY. 60 capsule 3  . cetirizine (ZYRTEC) 10 MG tablet Take 10 mg by mouth daily.    . citalopram (CELEXA) 40 MG tablet TAKE 1 TABLET BY MOUTH EVERY DAY 90 tablet 1  . COMBIPATCH 0.05-0.25 MG/DAY APPLY 1 PATCH AS DIRECTED TWO TIMES A WEEK  1  . fish oil-omega-3 fatty acids 1000 MG capsule Take 2 g by mouth 2 (two) times daily.      . hydrochlorothiazide (HYDRODIURIL) 25 MG tablet TAKE 1 TABLET BY MOUTH EVERY DAY 90  tablet 0  . losartan (COZAAR) 100 MG tablet TAKE 1 TABLET EVERY DAY 90 tablet 3  . omeprazole (PRILOSEC) 20 MG capsule Take 1 capsule (20 mg total) by mouth daily. 90 capsule 3  . ondansetron (ZOFRAN) 8 MG tablet Take 1 tablet (8 mg total) by mouth every 8 (eight) hours as needed for nausea or vomiting. 30 tablet 1  . promethazine (PHENERGAN) 25 MG tablet Take 1 tablet (25 mg total) by mouth every 6 (six) hours as needed for nausea or vomiting. 30 tablet 0  . promethazine (PHENERGAN) 25 MG tablet Take 1 tablet (25 mg total) by mouth every 8 (eight) hours as needed for nausea or vomiting. 20 tablet 0  . sertraline (ZOLOFT) 100 MG tablet Take 1 tablet (100 mg total) by mouth daily. 30 tablet 3  . sulfamethoxazole-trimethoprim (BACTRIM DS,SEPTRA DS) 800-160 MG tablet TAKE 1 TABLET BY MOUTH ONCE AFTER INTERCOURSE AS NEEDED 30 tablet 2  . Vitamins-Lipotropics (LIPOFLAVOVIT) TABS Take by mouth.    . zolpidem (AMBIEN) 10 MG tablet TAKE 1 TABLET AT BEDTIME AS NEEDED 30 tablet 2   No facility-administered medications prior to visit.     Allergies:  Allergies  Allergen Reactions  . Norvasc [Amlodipine Besylate] Swelling and Rash    Social History   Social History  . Marital status: Married    Spouse name: N/A  . Number of children: N/A  . Years of education: N/A   Occupational History  . Not on file.   Social History Main Topics  . Smoking status: Never Smoker  . Smokeless tobacco: Never Used  . Alcohol use 3.0 oz/week     5 Cans of beer per week  . Drug use: No  . Sexual activity: Yes    Birth control/ protection: Pill   Other Topics Concern  . Not on file   Social History Narrative  . No narrative on file    Family History  Problem Relation Age of Onset  . Cancer Mother 34    Breast Cancer  . Heart disease Father 44  . Diabetes Father   . Colon cancer Neg Hx   . Colon polyps Neg Hx      Review of Systems:  See HPI for pertinent ROS. All other ROS negative.    Physical Exam: Blood pressure 102/70, pulse 77, temperature 97.6 F (36.4 C), temperature source Oral, resp. rate 16, weight 140 lb 6.4 oz (63.7 kg), SpO2 99 %., Body mass index is 24.1 kg/m. General: WNWD WF. Appears in no acute distress. Neck: Supple. No thyromegaly. No lymphadenopathy. No carotid bruits. Lungs: Clear bilaterally to auscultation without wheezes, rales, or rhonchi. Breathing is unlabored. Heart: RRR with S1 S2. No murmurs, rubs, or gallops. Musculoskeletal:  Strength and tone normal for age. Extremities/Skin: Warm and dry.  No edema. Neuro: Alert and oriented X 3. Moves all extremities spontaneously. Gait is normal. CNII-XII grossly in tact. Psych:  Responds to questions appropriately with a normal affect.     ASSESSMENT AND PLAN:  56 y.o. year old female with  1. Mild single current episode of major depressive disorder ([place]) Will wean off Zoloft --- take 50mg  for 2 weeks----then start Cymbalta 60mg . (Hopefully SNRI will work for her) - sertraline (ZOLOFT) 50 MG tablet; Take 1 tablet (50 mg total) by mouth daily.  Dispense: 30 tablet; Refill: 1 - DULoxetine (CYMBALTA) 60 MG capsule; Take 1 capsule (60 mg total) by mouth daily.  Dispense: 30 capsule; Refill: 1    Follow-up office  visit 8 weeks. Sooner if needed.   Marin Olp [place], [place], [facility] 08/01/2016 2:41 PM

## 2016-08-06 ENCOUNTER — Other Ambulatory Visit: Payer: Self-pay | Admitting: Physician Assistant

## 2016-08-06 NOTE — Telephone Encounter (Signed)
Rx filled per protocol  

## 2016-09-15 ENCOUNTER — Other Ambulatory Visit: Payer: Self-pay | Admitting: Physician Assistant

## 2016-09-16 NOTE — Telephone Encounter (Signed)
Refill appropriate 

## 2016-09-22 ENCOUNTER — Other Ambulatory Visit: Payer: Self-pay | Admitting: Physician Assistant

## 2016-09-23 NOTE — Telephone Encounter (Signed)
Last OV 1-25 Last refill 12-27 Ok to refill?

## 2016-09-23 NOTE — Telephone Encounter (Signed)
rx called in

## 2016-09-23 NOTE — Telephone Encounter (Signed)
Approved. # [id number] 2. 

## 2016-09-26 ENCOUNTER — Encounter: Payer: Self-pay | Admitting: Physician Assistant

## 2016-09-30 ENCOUNTER — Ambulatory Visit: Payer: 59 | Admitting: Physician Assistant

## 2016-10-01 ENCOUNTER — Other Ambulatory Visit: Payer: Self-pay | Admitting: Physician Assistant

## 2016-10-01 DIAGNOSIS — F320 Major depressive disorder, single episode, mild: Secondary | ICD-10-CM

## 2016-10-01 DIAGNOSIS — F419 Anxiety disorder, unspecified: Secondary | ICD-10-CM

## 2016-10-01 NOTE — Telephone Encounter (Signed)
Refill appropriate 

## 2016-10-02 ENCOUNTER — Ambulatory Visit: Payer: 59 | Admitting: Physician Assistant

## 2016-10-03 ENCOUNTER — Other Ambulatory Visit: Payer: Self-pay

## 2016-10-03 MED ORDER — ATENOLOL 25 MG PO TABS: 25.0000 mg | ORAL_TABLET | Freq: Every day | ORAL | 0 refills | Status: DC

## 2016-10-03 NOTE — Telephone Encounter (Signed)
Refill appropriate 

## 2016-10-09 ENCOUNTER — Telehealth: Payer: Self-pay

## 2016-10-09 NOTE — Telephone Encounter (Signed)
Patient called and stated her celebrex was to expensive and is requesting a cheaper alternative.

## 2016-10-10 ENCOUNTER — Encounter: Payer: Self-pay | Admitting: Physician Assistant

## 2016-10-10 ENCOUNTER — Ambulatory Visit (INDEPENDENT_AMBULATORY_CARE_PROVIDER_SITE_OTHER): Payer: 59 | Admitting: Physician Assistant

## 2016-10-10 VITALS — BP 118/80 | HR 83 | Temp 98.3°F | Resp 16 | Wt 144.8 lb

## 2016-10-10 DIAGNOSIS — F419 Anxiety disorder, unspecified: Secondary | ICD-10-CM

## 2016-10-10 DIAGNOSIS — F320 Major depressive disorder, single episode, mild: Secondary | ICD-10-CM

## 2016-10-10 MED ORDER — DULOXETINE HCL 60 MG PO CPEP: ORAL_CAPSULE | ORAL | 5 refills | Status: DC

## 2016-10-10 NOTE — Telephone Encounter (Signed)
patient was seen in the office today

## 2016-10-10 NOTE — Progress Notes (Signed)
Patient ID: Erika Harvey MRN: [id number], DOB: October 14, 1960, 56 y.o. Date of Encounter: @DATE @  Chief Complaint:  Chief Complaint  Patient presents with  . F/U Depression    HPI: 56 y.o. year old female  presents for f/u OV to f/u Depression.    06/19/2016: Over the past 2 years she had been dealing with back pain. Ultimately underwent back surgery. Finally has gotten relief of the back pain. She was on short-term disability for a time during all of that and after her surgery. She worked with [employer]. Also at home she was the one that did all of their finances and took care of all of that at home.  During the time that she was requiring a lot of pain meds etc. and was feeling pain nausea and not feeling well she was unable to manage the family's finances. She came and had a visit with Dr. Dennard Schaumann 04/09/16. At that time reported that their credit cards were maxed out. Their credit score had fallen dramatically. At that time reported that her job may be in jeopardy. As a result of the mounting financial pressures and work pressures she had developed depression. At that visit she reported decreased energy, poor concentration, intrusive thoughts, panic attacks, trouble sleeping, anhedenia. Complained of depression. Was using Xanax 3 times a day to help control the anxiety. Was meeting with an agency later that day to help consolidate debt into 12 manageable payments. At that time stated that emotionally she felt unable to cope and was finding it difficult to get her life back on track.  At that visit Dr. Dennard Schaumann planned to wean off of Celexa and meanwhile start Trintelix 5 mg for one week and then increase to 10 mg in one week.  However on 05/07/16 patient called and reported that she had lost her job and would not be able to afford the Trentelex once refill due. Therefore was needing a less expensive alternative.  At that time Dr. Dennard Schaumann switched her to Zoloft 50 mg.  Today patient  states that when she started the Zoloft she could tell some difference and did feel like it was helping some. However only occasionally she "feels like her old self." Today she reports that she was laid off. She says that actually she thinks that her job was very surprised when she did return to work at all. Says that they had already gotten other employees to cover her job duties and then when she returned there really was minimal work for her to do. Today she reports that she went ahead and decided to use her 401(k).  Had to use all of her 401(k). When talking with her --in addition to her own emotions-- she also feels that she has let down her husband and has now put him in his bad position because she is the one that was responsible for the home finances and he didn't realize that during that period of time she was unable to take care of things and manage things. Says that now he totally understands the entire situation and everything is out in the open and clear and has been dealt with but still she feels this responsibility/burden. Says that she has updated her resume and is trying to find a new job. Says that generally she was always happy-go-lucky type personality and just doesn't feel that way now. Says every now and then she will laugh and feel that way and will tell her husband--"[name], did you notice that" but  says that only lasts a fleeting moment and then she is back to feeling depressed. Says she knows in her mind that everything is now cleared and taken care of and that everything will be okay but she just cannot get herself to feel better.  She is still taking all of her blood pressure medications as directed. Is having no adverse effects. No lightheadedness.  AT THAT OV --- Increased Zoloft to 100mg  QD   08/01/2016: Reviewed her chart. After her last visit she also had phone call message on 07/26/16 which I have reviewed. However she states that at the time of that phone call she was  just told to schedule an office visit and was not told about weaning off of Zoloft and starting Cymbalta.  She had called at that time to report that she had been on the Zoloft 100 mg. She reported that she was still feeling depressed, still having a hard time finding a job, still not feeling motivated to do anything. Here for follow-up visit to discuss further treatment options.  Confirmed with her today that the only medications that she has been on for this so far: Celexa--- which she was on for years up until recently 56--- was just recently started and then increased up to the 100 mg dose   Today she states that she still does not have a job. Says that she has had 3 interviews. Says that she is now thinking that she is just going to have to get another job that is not have to do with mortgages. Says that she has even used recruiters and that the market for employment regarding mortgages is just not great right now. She's gonna have to just get a different type of job even though it will not pay as well.  Says "On top of it all,a tree fell in the yard and fell across/on the fence-- and they can't afford to get it removed right now."  Says that she is not crying as much as she was in the past but definitely still feels depressed and does not feel that the Zoloft is doing anything.  Assessment/Plan at [place] 08/01/2016: 1. Mild single current episode of major depressive disorder ([place]) Will wean off Zoloft --- take 50mg  for 2 weeks----then start Cymbalta 60mg . (Hopefully SNRI will work for her) - sertraline (ZOLOFT) 50 MG tablet; Take 1 tablet (50 mg total) by mouth daily.  Dispense: 30 tablet; Refill: 1 - DULoxetine (CYMBALTA) 60 MG capsule; Take 1 capsule (60 mg total) by mouth daily.  Dispense: 30 capsule; Refill: 1   10/10/2016: Since her last office visit-- her husband had a bicep tendon rupture and had surgery.  Pt states that they were able to attach part of the tendon but could not  fully attach things so he will not regain 100% function. Says that when she did her taxes, she was excited because it look liked they should be getting $4500 back.  However they still have not gotten that money back because the IRS has continued to review their tax returns.  Was excited to get that money because that way she could use that to pay the house payment, car payment, and credit card payment.  Her Aunt ended up paying those payments for this month.  Recently found a job that was supposed to start this upcoming Tuesday. Then, right when she was walking in the door for her office visit here this morning-- she got a phone call from her recruiter saying that  the company had changed their mind. Just found out minutes ago that she does not have a job starting Tuesday. Obviously is upset about this.  Otherwise though does feel like the Cymbalta has been helping. Says that she definitely has been able to think more clearly and focus. Also today she seems a lot more like her old self than she has at recent OVs.  Past Medical History:  Diagnosis Date  . Allergy   . Anxiety   . Chronic low back pain   . Fibroid 03/30/2015  . Frequent UTI   . Hypertension   . Vaginal bleeding 03/30/2015     Home Meds: Outpatient Medications Prior to Visit  Medication Sig Dispense Refill  . ALPRAZolam (XANAX) 0.5 MG tablet TAKE 1 TABLET BY MOUTH THREE TIMES DAILY 90 tablet 2  . aspirin 81 MG tablet Take 81 mg by mouth daily.      Marland Kitchen atenolol (TENORMIN) 25 MG tablet Take 1 tablet (25 mg total) by mouth daily. 90 tablet 0  . calcium-vitamin D (OSCAL WITH D) 250-125 MG-UNIT per tablet Take 1 tablet by mouth daily.      . celecoxib (CELEBREX) 100 MG capsule TAKE 1 CAPSULE (100 MG TOTAL) BY MOUTH 2 (TWO) TIMES DAILY. 60 capsule 3  . cetirizine (ZYRTEC) 10 MG tablet Take 10 mg by mouth daily.    . COMBIPATCH 0.05-0.25 MG/DAY APPLY 1 PATCH AS DIRECTED TWO TIMES A WEEK  1  . DULoxetine (CYMBALTA) 60 MG capsule TAKE  1 CAPSULE(60 MG) BY MOUTH DAILY 30 capsule 0  . fish oil-omega-3 fatty acids 1000 MG capsule Take 2 g by mouth 2 (two) times daily.      . hydrochlorothiazide (HYDRODIURIL) 25 MG tablet TAKE 1 TABLET BY MOUTH EVERY DAY 90 tablet 0  . losartan (COZAAR) 100 MG tablet TAKE 1 TABLET EVERY DAY 90 tablet 3  . omeprazole (PRILOSEC) 20 MG capsule Take 1 capsule (20 mg total) by mouth daily. 90 capsule 3  . ondansetron (ZOFRAN) 8 MG tablet Take 1 tablet (8 mg total) by mouth every 8 (eight) hours as needed for nausea or vomiting. 30 tablet 1  . promethazine (PHENERGAN) 25 MG tablet Take 1 tablet (25 mg total) by mouth every 8 (eight) hours as needed for nausea or vomiting. 20 tablet 0  . sertraline (ZOLOFT) 50 MG tablet Take 1 tablet (50 mg total) by mouth daily. 30 tablet 1  . sulfamethoxazole-trimethoprim (BACTRIM DS,SEPTRA DS) 800-160 MG tablet TAKE 1 TABLET BY MOUTH ONCE AFTER INTERCOURSE AS NEEDED 30 tablet 2  . Vitamins-Lipotropics (LIPOFLAVOVIT) TABS Take by mouth.    . zolpidem (AMBIEN) 10 MG tablet TAKE 1 TABLET AT BEDTIME AS NEEDED 30 tablet 2  . promethazine (PHENERGAN) 25 MG tablet Take 1 tablet (25 mg total) by mouth every 6 (six) hours as needed for nausea or vomiting. 30 tablet 0   No facility-administered medications prior to visit.     Allergies:  Allergies  Allergen Reactions  . Norvasc [Amlodipine Besylate] Swelling and Rash    Social History   Social History  . Marital status: Married    Spouse name: N/A  . Number of children: N/A  . Years of education: N/A   Occupational History  . Not on file.   Social History Main Topics  . Smoking status: Never Smoker  . Smokeless tobacco: Never Used  . Alcohol use 3.0 oz/week    5 Cans of beer per week  . Drug use: No  . Sexual  activity: Yes    Birth control/ protection: Pill   Other Topics Concern  . Not on file   Social History Narrative  . No narrative on file    Family History  Problem Relation Age of Onset  .  Cancer Mother 55    Breast Cancer  . Heart disease Father 11  . Diabetes Father   . Colon cancer Neg Hx   . Colon polyps Neg Hx      Review of Systems:  See HPI for pertinent ROS. All other ROS negative.    Physical Exam: Blood pressure 118/80, pulse 83, temperature 98.3 F (36.8 C), temperature source Oral, resp. rate 16, weight 144 lb 12.8 oz (65.7 kg), SpO2 98 %., There is no height or weight on file to calculate BMI. General: WNWD WF. Appears in no acute distress. Neck: Supple. No thyromegaly. No lymphadenopathy. No carotid bruits. Lungs: Clear bilaterally to auscultation without wheezes, rales, or rhonchi. Breathing is unlabored. Heart: RRR with S1 S2. No murmurs, rubs, or gallops. Musculoskeletal:  Strength and tone normal for age. Extremities/Skin: Warm and dry.  No edema. Neuro: Alert and oriented X 3. Moves all extremities spontaneously. Gait is normal. CNII-XII grossly in tact. Psych:  Responds to questions appropriately with a normal affect.     ASSESSMENT AND PLAN:  56 y.o. year old female with   Mild single current episode of major depressive disorder ([place]) Anxiety Continue current dose of Cymbalta. Wait 6 months for follow-up visit if anxiety and depression remain stable. - DULoxetine (CYMBALTA) 60 MG capsule; TAKE 1 CAPSULE(60 MG) BY MOUTH DAILY  Dispense: 30 capsule; Refill: 5  BP is stable and controlled. She had lab to monitor 06/2016. Can wait 6 months for follow-up visit.      Signed, [address] [place], [place], [facility] 10/10/2016 9:15 AM

## 2016-10-16 ENCOUNTER — Other Ambulatory Visit: Payer: Self-pay | Admitting: Physician Assistant

## 2016-10-16 NOTE — Telephone Encounter (Signed)
Ok to refill 

## 2016-10-16 NOTE — Telephone Encounter (Signed)
RX called in .

## 2016-10-16 NOTE — Telephone Encounter (Signed)
Approved. #30+2. 

## 2016-10-29 ENCOUNTER — Other Ambulatory Visit: Payer: Self-pay | Admitting: Physician Assistant

## 2016-10-29 NOTE — Telephone Encounter (Signed)
Approved. #[id number]. 

## 2016-10-29 NOTE — Telephone Encounter (Signed)
Rx called in to pharmacy. 

## 2016-10-29 NOTE — Telephone Encounter (Signed)
Ok to refill 

## 2016-11-04 ENCOUNTER — Other Ambulatory Visit: Payer: Self-pay | Admitting: Physician Assistant

## 2016-11-04 DIAGNOSIS — F419 Anxiety disorder, unspecified: Secondary | ICD-10-CM

## 2016-11-04 DIAGNOSIS — F320 Major depressive disorder, single episode, mild: Secondary | ICD-10-CM

## 2016-11-04 NOTE — Telephone Encounter (Signed)
Refill appropriate 

## 2016-11-06 ENCOUNTER — Telehealth: Payer: Self-pay

## 2016-11-06 MED ORDER — ZOLPIDEM TARTRATE ER 12.5 MG PO TBCR: 12.5000 mg | EXTENDED_RELEASE_TABLET | Freq: Every day | ORAL | 3 refills | Status: DC

## 2016-11-06 NOTE — Telephone Encounter (Signed)
Change to Ambien CR 12.5mg  1 po QHS # 30 +3

## 2016-11-06 NOTE — Telephone Encounter (Signed)
Patient called and left a Vm asking if her Ambien can be changed because it is no longer working for her she is either not getting any sleep or when she does it is only for 4 hors at a time. Pls Advise

## 2016-11-06 NOTE — Telephone Encounter (Signed)
Rx called in to pharmacy. Lvm for patient

## 2016-11-07 ENCOUNTER — Encounter: Payer: Self-pay | Admitting: Physician Assistant

## 2016-11-07 ENCOUNTER — Telehealth: Payer: Self-pay

## 2016-11-07 NOTE — Telephone Encounter (Signed)
Let patient know and see if she wants to go back to the 10 mg dose.

## 2016-11-07 NOTE — Telephone Encounter (Signed)
Prior authorization is needed for Zolpidem ER 12.5 mg tablets. Processed prior authoirzation # [id number] is denied due to Plan Exclusion.  Spoke with Arby Barrette with [employer] in prior authorization dept she said the only alternatives were Zolpidem 5 mg and 10mg  and Zaleplon 5mg  and 10 mg. Patient was on Zolpidem 10mg  prior.

## 2016-11-11 NOTE — Telephone Encounter (Signed)
Yes. Can send in Ambien 10 mg 1 po QHS prn  #30+2 refills

## 2016-11-11 NOTE — Telephone Encounter (Signed)
Patient states she will stay on the ambien 10 mg. She did pick up 7 days worth of the 12.5mg   but states with insurance it cost to much . Ok to send in refill for  ambien 10 mg?

## 2016-11-13 MED ORDER — ZOLPIDEM TARTRATE 10 MG PO TABS: 10.0000 mg | ORAL_TABLET | Freq: Every evening | ORAL | 2 refills | Status: DC | PRN

## 2016-11-13 NOTE — Telephone Encounter (Signed)
Rx called in to pharmacy. 

## 2016-11-20 ENCOUNTER — Other Ambulatory Visit: Payer: Self-pay

## 2016-11-20 MED ORDER — PROMETHAZINE HCL 25 MG PO TABS: 25.0000 mg | ORAL_TABLET | Freq: Three times a day (TID) | ORAL | 0 refills | Status: DC | PRN

## 2016-11-20 NOTE — Telephone Encounter (Signed)
Refill appropriate patient understands she can only get 30 day supply until bill has been paid

## 2016-11-22 ENCOUNTER — Telehealth: Payer: Self-pay

## 2016-11-22 NOTE — Telephone Encounter (Signed)
Patinet called and back and I explained to patient that she would need to be seen and discuss the medication she is wanting to go back on. Patient has a balance due and can not be seen until the bill has been pd explained to pt she will need to call her gyn and see if they will put her back on the pill. Patient states she will do that

## 2016-11-22 NOTE — Telephone Encounter (Signed)
Patient called and left a message asking to be put back on birth control pills because the patch was too expensive.  Patient states her gyn put her on the patch.

## 2016-12-18 ENCOUNTER — Ambulatory Visit (INDEPENDENT_AMBULATORY_CARE_PROVIDER_SITE_OTHER): Payer: 59 | Admitting: Physician Assistant

## 2016-12-18 ENCOUNTER — Encounter: Payer: Self-pay | Admitting: Physician Assistant

## 2016-12-18 ENCOUNTER — Other Ambulatory Visit: Payer: Self-pay | Admitting: Physician Assistant

## 2016-12-18 VITALS — BP 130/82 | HR 81 | Temp 97.5°F | Resp 16 | Wt 140.4 lb

## 2016-12-18 DIAGNOSIS — F419 Anxiety disorder, unspecified: Secondary | ICD-10-CM | POA: Diagnosis not present

## 2016-12-18 MED ORDER — DIAZEPAM 2 MG PO TABS: 2.0000 mg | ORAL_TABLET | Freq: Two times a day (BID) | ORAL | 0 refills | Status: DC | PRN

## 2016-12-18 NOTE — Progress Notes (Signed)
Patient ID: Erika Harvey MRN: [id number], DOB: [birth year], 56 y.o. Date of Encounter: @DATE @  Chief Complaint:  Chief Complaint  Patient presents with  . F/U Depression    HPI: 56 y.o. year old female  presents for f/u OV to f/u Depression.    06/19/2016: Over the past 2 years she had been dealing with back pain. Ultimately underwent back surgery. Finally has gotten relief of the back pain. She was on short-term disability for a time during all of that and after her surgery. She worked with [employer]. Also at home she was the one that did all of their finances and took care of all of that at home.  During the time that she was requiring a lot of pain meds etc. and was feeling pain nausea and not feeling well she was unable to manage the family's finances. She came and had a visit with Dr. Dennard Schaumann 04/09/16. At that time reported that their credit cards were maxed out. Their credit score had fallen dramatically. At that time reported that her job may be in jeopardy. As a result of the mounting financial pressures and work pressures she had developed depression. At that visit she reported decreased energy, poor concentration, intrusive thoughts, panic attacks, trouble sleeping, anhedenia. Complained of depression. Was using Xanax 3 times a day to help control the anxiety. Was meeting with an agency later that day to help consolidate debt into 12 manageable payments. At that time stated that emotionally she felt unable to cope and was finding it difficult to get her life back on track.  At that visit Dr. Dennard Schaumann planned to wean off of Celexa and meanwhile start Trintelix 5 mg for one week and then increase to 10 mg in one week.  However on 05/07/16 patient called and reported that she had lost her job and would not be able to afford the Trentelex once refill due. Therefore was needing a less expensive alternative.  At that time Dr. Dennard Schaumann switched her to Zoloft 50 mg.  Today patient  states that when she started the Zoloft she could tell some difference and did feel like it was helping some. However only occasionally she "feels like her old self." Today she reports that she was laid off. She says that actually she thinks that her job was very surprised when she did return to work at all. Says that they had already gotten other employees to cover her job duties and then when she returned there really was minimal work for her to do. Today she reports that she went ahead and decided to use her 401(k).  Had to use all of her 401(k). When talking with her --in addition to her own emotions-- she also feels that she has let down her husband and has now put him in his bad position because she is the one that was responsible for the home finances and he didn't realize that during that period of time she was unable to take care of things and manage things. Says that now he totally understands the entire situation and everything is out in the open and clear and has been dealt with but still she feels this responsibility/burden. Says that she has updated her resume and is trying to find a new job. Says that generally she was always happy-go-lucky type personality and just doesn't feel that way now. Says every now and then she will laugh and feel that way and will tell her husband--"[name], did you notice that" but  says that only lasts a fleeting moment and then she is back to feeling depressed. Says she knows in her mind that everything is now cleared and taken care of and that everything will be okay but she just cannot get herself to feel better.  She is still taking all of her blood pressure medications as directed. Is having no adverse effects. No lightheadedness.  AT THAT OV --- Increased Zoloft to 100mg  QD   08/01/2016: Reviewed her chart. After her last visit she also had phone call message on 07/26/16 which I have reviewed. However she states that at the time of that phone call she was  just told to schedule an office visit and was not told about weaning off of Zoloft and starting Cymbalta.  She had called at that time to report that she had been on the Zoloft 100 mg. She reported that she was still feeling depressed, still having a hard time finding a job, still not feeling motivated to do anything. Here for follow-up visit to discuss further treatment options.  Confirmed with her today that the only medications that she has been on for this so far: Celexa--- which she was on for years up until recently 35--- was just recently started and then increased up to the 100 mg dose   Today she states that she still does not have a job. Says that she has had 3 interviews. Says that she is now thinking that she is just going to have to get another job that is not have to do with mortgages. Says that she has even used recruiters and that the market for employment regarding mortgages is just not great right now. She's gonna have to just get a different type of job even though it will not pay as well.  Says "On top of it all,a tree fell in the yard and fell across/on the fence-- and they can't afford to get it removed right now."  Says that she is not crying as much as she was in the past but definitely still feels depressed and does not feel that the Zoloft is doing anything.  Assessment/Plan at [place] 08/01/2016: 1. Mild single current episode of major depressive disorder ([place]) Will wean off Zoloft --- take 50mg  for 2 weeks----then start Cymbalta 60mg . (Hopefully SNRI will work for her) - sertraline (ZOLOFT) 50 MG tablet; Take 1 tablet (50 mg total) by mouth daily.  Dispense: 30 tablet; Refill: 1 - DULoxetine (CYMBALTA) 60 MG capsule; Take 1 capsule (60 mg total) by mouth daily.  Dispense: 30 capsule; Refill: 1   10/10/2016: Since her last office visit-- her husband had a bicep tendon rupture and had surgery.  Pt states that they were able to attach part of the tendon but could not  fully attach things so he will not regain 100% function. Says that when she did her taxes, she was excited because it look liked they should be getting $4500 back.  However they still have not gotten that money back because the IRS has continued to review their tax returns.  Was excited to get that money because that way she could use that to pay the house payment, car payment, and credit card payment.  Her Aunt ended up paying those payments for this month.  Recently found a job that was supposed to start this upcoming Tuesday. Then, right when she was walking in the door for her office visit here this morning-- she got a phone call from her recruiter saying that  the company had changed their mind. Just found out minutes ago that she does not have a job starting Tuesday. Obviously is upset about this.  Otherwise though does feel like the Cymbalta has been helping. Says that she definitely has been able to think more clearly and focus. Also today she seems a lot more like her old self than she has at recent OVs.   12/18/2016: Today patient gives me update regarding her husband as well. Says that it was going to be time for him to return to work and wife knew he was going to have major problems returning to work because his work involves very heavy lifting and his right bicep is not fully functioning. Says that she went with him to appt and explained her concern and orthopedics wrote for him to do 6 weeks of physical therapy -- he is out of work another 6 weeks to do this physical therapy. She states that regarding her own update-- she says that she definitely has fully accepted that she will not even consider returning to any type of job/work similar to her recent jobs. Says that she definitely does not want to even have to deal with that stress level again and she is totally fine with looking for a much lower paying job-- just something to have some income. Says that she has heard of 1 really good temp  agency and is going to go through them. Says that the reason that she made today's appointment is that she finally realized something. Reviewed the fact that there was a time where she was on pain meds etc. that were causing her to feel very nauseous and very sick etc. Says that she has continued to have some periods of time where she would feel some slight nausea "but not  The loopy type nausea" that she was having with the pain pills. Says that she finally realized that this kind of nausea that she has continued to have at times-- occurs when her body is withdrawing from Xanax. Says that anytime the medicine is wearing off, she can feel increase in her anxiety, decrease in her appetite, and nauseous feeling. This will happen during the day if the medicine has worn off and she has not had another dose and also has definitely noticed that this occurs if her medicine has run out and she has not picked up the refill yet (and goes day(s) without medication. Says that she has talked to multiple friends who have told her that they experienced the same thing but that it does not occur with using Valium. Is wanting to switch the Xanax to Valium. Reviewed the fact that even prior to all of these back issues and other problems-- that she had always required Xanax for many years and that in the past even when she was working in [place] she would have to take the Xanax even throughout the day in order to calm down enough to focus and do her work. It never made her feel "loopy or out of it". Rather, she had told me in the past that without the medicine she felt too anxious to sit still and focus. She says that even right now even though she is not having to work a job that she definitely can tell she has to have medicine or else she feels increased anxiety--- says "that's just the way I am--I can't control it -- that 's just the way my body is." No other concerns to address today.  Past Medical History:  Diagnosis Date   . Allergy   . Anxiety   . Chronic low back pain   . Fibroid 03/30/2015  . Frequent UTI   . Hypertension   . Vaginal bleeding 03/30/2015     Home Meds: Outpatient Medications Prior to Visit  Medication Sig Dispense Refill  . aspirin 81 MG tablet Take 81 mg by mouth daily.      Marland Kitchen atenolol (TENORMIN) 25 MG tablet Take 1 tablet (25 mg total) by mouth daily. 90 tablet 0  . calcium-vitamin D (OSCAL WITH D) 250-125 MG-UNIT per tablet Take 1 tablet by mouth daily.      . celecoxib (CELEBREX) 100 MG capsule TAKE 1 CAPSULE (100 MG TOTAL) BY MOUTH 2 (TWO) TIMES DAILY. 60 capsule 3  . celecoxib (CELEBREX) 100 MG capsule TAKE 1 CAPSULE TWICE A DAY 60 capsule 4  . cetirizine (ZYRTEC) 10 MG tablet Take 10 mg by mouth daily.    . COMBIPATCH 0.05-0.25 MG/DAY APPLY 1 PATCH AS DIRECTED TWO TIMES A WEEK  1  . DULoxetine (CYMBALTA) 60 MG capsule TAKE 1 CAPSULE(60 MG) BY MOUTH DAILY 30 capsule 2  . fish oil-omega-3 fatty acids 1000 MG capsule Take 2 g by mouth 2 (two) times daily.      . hydrochlorothiazide (HYDRODIURIL) 25 MG tablet TAKE 1 TABLET BY MOUTH EVERY DAY 90 tablet 0  . losartan (COZAAR) 100 MG tablet TAKE 1 TABLET EVERY DAY 90 tablet 3  . omeprazole (PRILOSEC) 20 MG capsule Take 1 capsule (20 mg total) by mouth daily. 90 capsule 3  . promethazine (PHENERGAN) 25 MG tablet Take 1 tablet (25 mg total) by mouth every 8 (eight) hours as needed for nausea or vomiting. 30 tablet 0  . sulfamethoxazole-trimethoprim (BACTRIM DS,SEPTRA DS) 800-160 MG tablet TAKE 1 TABLET BY MOUTH ONCE AFTER INTERCOURSE AS NEEDED 30 tablet 2  . Vitamins-Lipotropics (LIPOFLAVOVIT) TABS Take by mouth.    . ALPRAZolam (XANAX) 0.5 MG tablet TAKE 1 TABLET BY MOUTH THREE TIMES DAILY 90 tablet 2  . zolpidem (AMBIEN) 10 MG tablet Take 1 tablet (10 mg total) by mouth at bedtime as needed for sleep. 30 tablet 2  . DULoxetine (CYMBALTA) 60 MG capsule TAKE 1 CAPSULE(60 MG) BY MOUTH DAILY 30 capsule 5  . ondansetron (ZOFRAN) 8 MG tablet  Take 1 tablet (8 mg total) by mouth every 8 (eight) hours as needed for nausea or vomiting. 30 tablet 1   No facility-administered medications prior to visit.     Allergies:  Allergies  Allergen Reactions  . Norvasc [Amlodipine Besylate] Swelling and Rash    Social History   Social History  . Marital status: Married    Spouse name: N/A  . Number of children: N/A  . Years of education: N/A   Occupational History  . Not on file.   Social History Main Topics  . Smoking status: Never Smoker  . Smokeless tobacco: Never Used  . Alcohol use 3.0 oz/week    5 Cans of beer per week  . Drug use: No  . Sexual activity: Yes    Birth control/ protection: Pill   Other Topics Concern  . Not on file   Social History Narrative  . No narrative on file    Family History  Problem Relation Age of Onset  . Cancer Mother 21       Breast Cancer  . Heart disease Father 4  . Diabetes Father   . Colon cancer Neg Hx   .  Colon polyps Neg Hx      Review of Systems:  See HPI for pertinent ROS. All other ROS negative.    Physical Exam: Blood pressure 130/82, pulse 81, temperature 97.5 F (36.4 C), temperature source Oral, resp. rate 16, weight 140 lb 6.4 oz (63.7 kg), SpO2 99 %., Body mass index is 24.1 kg/m. General: WNWD WF. Appears in no acute distress. Neck: Supple. No thyromegaly. No lymphadenopathy. No carotid bruits. Lungs: Clear bilaterally to auscultation without wheezes, rales, or rhonchi. Breathing is unlabored. Heart: RRR with S1 S2. No murmurs, rubs, or gallops. Musculoskeletal:  Strength and tone normal for age. Extremities/Skin: Warm and dry.  No edema. Neuro: Alert and oriented X 3. Moves all extremities spontaneously. Gait is normal. CNII-XII grossly in tact. Psych:  Responds to questions appropriately with a normal affect.     ASSESSMENT AND PLAN:  56 y.o. year old female with   Mild single current episode of major depressive disorder  ([place]) Anxiety  12/18/2016: Will change Klonopin 0.5 mg 3 times a day to Valium 2 mg twice a day. I told her that if she takes the Valium 2 mg dose and feels no effect to call me and can adjust the dose. Discussed with her that these are similar medicines just that the Valium effect/half-life is longer compared to the Klonopin. Hopefully she will have less issues with peaks and troughs and withdrawal symptoms. However did discuss that if she lets the prescription run out and goes without medication for a day but I would expect for her to have similar symptoms on those  days as she did with when off of the Klonopin.  1. Anxiety - diazepam (VALIUM) 2 MG tablet; Take 1 tablet (2 mg total) by mouth every 12 (twelve) hours as needed for anxiety.  Dispense: 60 tablet; Refill: 0   Continue current dose of Cymbalta. Wait 6 months for follow-up visit if anxiety and depression remain stable. - DULoxetine (CYMBALTA) 60 MG capsule; TAKE 1 CAPSULE(60 MG) BY MOUTH DAILY  Dispense: 30 capsule; Refill: 5  BP is stable and controlled. She had lab to monitor 06/2016. Can wait 6 months for follow-up visit.      Signed, [address] [place], [place], [facility] 12/18/2016 1:33 PM

## 2016-12-18 NOTE — Telephone Encounter (Signed)
Last OV 10/10/2016 Last refill 09/23/2016 Ok to refill?

## 2016-12-18 NOTE — Telephone Encounter (Signed)
Approved---Alprazolam No. 90 +2. HCTZ No. 90 +1.

## 2016-12-18 NOTE — Telephone Encounter (Signed)
Pt was seen in office and had problems with xanax and requested to have medication switched

## 2016-12-25 ENCOUNTER — Telehealth: Payer: Self-pay

## 2016-12-25 MED ORDER — DIAZEPAM 5 MG PO TABS: 5.0000 mg | ORAL_TABLET | Freq: Two times a day (BID) | ORAL | 0 refills | Status: DC | PRN

## 2016-12-25 NOTE — Telephone Encounter (Signed)
Approved. At her recent OV, I had told her that we would try that dose and for her to let me know how it affected her and that we could adjust the dose accordingly. Send Rx for Valim 5mg  1 by mouth twice a day when necessary #60+ 0 Tell patient to follow-up with [place] again after taking this medicine for a while to let us know how it is working and how frequently she is needing to take this medication--- then I can put appropriate refills on her next Rx-- but for now will just prescribe #60+0.

## 2016-12-25 NOTE — Telephone Encounter (Signed)
Rx called in to pharmacy. 

## 2016-12-25 NOTE — Telephone Encounter (Signed)
Pt states the valium is working better for her than the xanax and is req  the next higher dose. Pt states she feels a higher dose would be better.  Pls advise

## 2017-01-14 ENCOUNTER — Other Ambulatory Visit: Payer: Self-pay | Admitting: Physician Assistant

## 2017-01-14 NOTE — Telephone Encounter (Signed)
Approved. #[id number]. 

## 2017-01-14 NOTE — Telephone Encounter (Signed)
Last OV 6/13 Last refill 5/9 Ok to refill?

## 2017-01-15 NOTE — Telephone Encounter (Signed)
Rx called in to pharmacy. 

## 2017-01-22 ENCOUNTER — Other Ambulatory Visit: Payer: Self-pay | Admitting: Physician Assistant

## 2017-01-22 NOTE — Telephone Encounter (Signed)
Okay to refill? 

## 2017-01-22 NOTE — Telephone Encounter (Signed)
Last OV 12/18/2016 Last refill 12/25/2016 Ok to refill?

## 2017-01-24 NOTE — Telephone Encounter (Signed)
Pt called stating refill has not been received by [facility] [address].

## 2017-01-24 NOTE — Telephone Encounter (Signed)
Medication called/sent to requested pharmacy  

## 2017-01-31 ENCOUNTER — Other Ambulatory Visit: Payer: Self-pay | Admitting: Physician Assistant

## 2017-01-31 NOTE — Telephone Encounter (Signed)
Refill appropriate 

## 2017-02-02 ENCOUNTER — Other Ambulatory Visit: Payer: Self-pay | Admitting: Physician Assistant

## 2017-02-02 DIAGNOSIS — F320 Major depressive disorder, single episode, mild: Secondary | ICD-10-CM

## 2017-02-02 DIAGNOSIS — F419 Anxiety disorder, unspecified: Secondary | ICD-10-CM

## 2017-02-03 NOTE — Telephone Encounter (Signed)
Refill appropriate 

## 2017-02-05 ENCOUNTER — Other Ambulatory Visit: Payer: Self-pay | Admitting: Physician Assistant

## 2017-02-05 NOTE — Telephone Encounter (Signed)
Last OV 12/18/2016 Last refill 01/15/2017 Ok to refill?

## 2017-02-05 NOTE — Telephone Encounter (Signed)
Approved. #[id number]. 

## 2017-02-06 NOTE — Telephone Encounter (Signed)
rx called into pharmacy

## 2017-02-20 ENCOUNTER — Other Ambulatory Visit: Payer: Self-pay | Admitting: Physician Assistant

## 2017-02-20 NOTE — Telephone Encounter (Signed)
Last OV 6/13 Last refill 7/20 Ok to refill?

## 2017-02-20 NOTE — Telephone Encounter (Signed)
Approved # [id number] + 2 

## 2017-02-21 NOTE — Telephone Encounter (Signed)
rx called into pharmacy

## 2017-03-17 ENCOUNTER — Ambulatory Visit (INDEPENDENT_AMBULATORY_CARE_PROVIDER_SITE_OTHER): Payer: 59 | Admitting: Physician Assistant

## 2017-03-17 ENCOUNTER — Encounter: Payer: Self-pay | Admitting: Physician Assistant

## 2017-03-17 VITALS — BP 136/84 | HR 88 | Temp 98.8°F | Resp 16 | Ht 64.0 in | Wt 143.0 lb

## 2017-03-17 DIAGNOSIS — J988 Other specified respiratory disorders: Secondary | ICD-10-CM | POA: Diagnosis not present

## 2017-03-17 DIAGNOSIS — B9689 Other specified bacterial agents as the cause of diseases classified elsewhere: Principal | ICD-10-CM

## 2017-03-17 MED ORDER — AZITHROMYCIN 250 MG PO TABS: ORAL_TABLET | ORAL | 0 refills | Status: DC

## 2017-03-17 NOTE — Progress Notes (Signed)
Patient ID: BRITA JURGENSEN MRN: [id number], DOB: Apr 01, 1961, 56 y.o. Date of Encounter: 03/17/2017, 12:54 PM    Chief Complaint:  Chief Complaint  Patient presents with  . Head & Chest congestion, cough,ST, drainage,dizziness     HPI: 56 y.o. year old female presents with above.   She states all these symptoms started about a week ago or maybe a little bit more. She has had some scratchy throat and sore throat. A lot of drainage in her throat. Her cough is just secondary to drainage in her throat no deep chest congestion. Also having some runny nose and nasal congestion. No significant earache. No fevers or chills.     Home Meds:   Outpatient Medications Prior to Visit  Medication Sig Dispense Refill  . aspirin 81 MG tablet Take 81 mg by mouth daily.      Marland Kitchen atenolol (TENORMIN) 25 MG tablet TAKE 1 TABLET (25 MG TOTAL) BY MOUTH DAILY. 90 tablet 0  . calcium-vitamin D (OSCAL WITH D) 250-125 MG-UNIT per tablet Take 1 tablet by mouth daily.      . celecoxib (CELEBREX) 100 MG capsule TAKE 1 CAPSULE TWICE A DAY 60 capsule 4  . cetirizine (ZYRTEC) 10 MG tablet Take 10 mg by mouth daily.    . diazepam (VALIUM) 5 MG tablet TAKE 1 TABLET BY MOUTH TWICE A DAY AS NEEDED FOR ANXIETY 60 tablet 0  . DULoxetine (CYMBALTA) 60 MG capsule TAKE 1 CAPSULE(60 MG) BY MOUTH DAILY 30 capsule 5  . fish oil-omega-3 fatty acids 1000 MG capsule Take 2 g by mouth 2 (two) times daily.      . hydrochlorothiazide (HYDRODIURIL) 25 MG tablet TAKE 1 TABLET BY MOUTH EVERY DAY 90 tablet 1  . losartan (COZAAR) 100 MG tablet TAKE 1 TABLET EVERY DAY 90 tablet 3  . omeprazole (PRILOSEC) 20 MG capsule Take 1 capsule (20 mg total) by mouth daily. 90 capsule 3  . promethazine (PHENERGAN) 25 MG tablet Take 1 tablet (25 mg total) by mouth every 8 (eight) hours as needed for nausea or vomiting. 30 tablet 0  . sulfamethoxazole-trimethoprim (BACTRIM DS,SEPTRA DS) 800-160 MG tablet TAKE 1 TABLET BY MOUTH ONCE AFTER INTERCOURSE  AS NEEDED 30 tablet 2  . Vitamins-Lipotropics (LIPOFLAVOVIT) TABS Take by mouth.    . zolpidem (AMBIEN) 10 MG tablet TAKE 1 TABLET BY MOUTH EVERY NIGHT AT BEDTIME AS NEEDED FOR SLEEP 30 tablet 3  . celecoxib (CELEBREX) 100 MG capsule TAKE 1 CAPSULE (100 MG TOTAL) BY MOUTH 2 (TWO) TIMES DAILY. 60 capsule 3  . COMBIPATCH 0.05-0.25 MG/DAY APPLY 1 PATCH AS DIRECTED TWO TIMES A WEEK  1   No facility-administered medications prior to visit.     Allergies:  Allergies  Allergen Reactions  . Norvasc [Amlodipine Besylate] Swelling and Rash      Review of Systems: See HPI for pertinent ROS. All other ROS negative.    Physical Exam: Blood pressure 136/84, pulse 88, temperature 98.8 F (37.1 C), temperature source Oral, resp. rate 16, height 5\' 4"  (1.626 m), weight 143 lb (64.9 kg), SpO2 97 %., Body mass index is 24.55 kg/m. General:  WNWD WF. Appears in no acute distress. HEENT: Normocephalic, atraumatic, eyes without discharge, sclera non-icteric, nares are without discharge. Bilateral auditory canals clear, TM's are without perforation, pearly grey and translucent with reflective cone of light bilaterally. Oral cavity moist, posterior pharynx without exudate, erythema, peritonsillar abscess.  Neck: Supple. No thyromegaly. No lymphadenopathy. Lungs: Clear bilaterally to auscultation without wheezes,  rales, or rhonchi. Breathing is unlabored. Heart: Regular rhythm. No murmurs, rubs, or gallops. Msk:  Strength and tone normal for age. Extremities/Skin: Warm and dry. Neuro: Alert and oriented X 3. Moves all extremities spontaneously. Gait is normal. CNII-XII grossly in tact. Psych:  Responds to questions appropriately with a normal affect.     ASSESSMENT AND PLAN:  56 y.o. year old female with  1. Bacterial respiratory infection She is to take antibiotic as directed. Follow-up if symptoms do not resolve within 1 week after completion of antibiotic. - azithromycin (ZITHROMAX) 250 MG tablet;  Day 1: Take 2 daily. Days 2 - 5: Take 1 daily.  Dispense: 6 tablet; Refill: 0   Signed, [address] [place], [place], [facility] 03/17/2017 12:54 PM

## 2017-03-21 ENCOUNTER — Encounter: Payer: Self-pay | Admitting: Physician Assistant

## 2017-03-21 ENCOUNTER — Telehealth: Payer: Self-pay | Admitting: Physician Assistant

## 2017-03-21 NOTE — Telephone Encounter (Signed)
Patient still feels bad was seen on Monday. Would like something else called into [facility] in [place]. She still has the hacking cough and a lot of mucus.   CB#[phone]

## 2017-03-21 NOTE — Telephone Encounter (Signed)
Left message.  Symptoms can linger after Zpak but Zpak does stay is system for 10 days.  Try some OTC meds for symptoms.  If still not better Monday call for appt.  If much worse over weekend can try Urgent care

## 2017-03-24 ENCOUNTER — Encounter: Payer: Self-pay | Admitting: Physician Assistant

## 2017-03-24 ENCOUNTER — Ambulatory Visit (INDEPENDENT_AMBULATORY_CARE_PROVIDER_SITE_OTHER): Payer: 59 | Admitting: Physician Assistant

## 2017-03-24 ENCOUNTER — Telehealth: Payer: Self-pay

## 2017-03-24 VITALS — BP 148/100 | HR 85 | Temp 98.0°F | Resp 14 | Wt 141.4 lb

## 2017-03-24 DIAGNOSIS — J988 Other specified respiratory disorders: Secondary | ICD-10-CM | POA: Diagnosis not present

## 2017-03-24 DIAGNOSIS — B9689 Other specified bacterial agents as the cause of diseases classified elsewhere: Principal | ICD-10-CM

## 2017-03-24 MED ORDER — CEFDINIR 300 MG PO CAPS: 300.0000 mg | ORAL_CAPSULE | Freq: Two times a day (BID) | ORAL | 0 refills | Status: DC

## 2017-03-24 NOTE — Progress Notes (Signed)
Patient ID: Erika Harvey MRN: [id number], DOB: 01-28-1961, 56 y.o. Date of Encounter: 03/24/2017, 5:07 PM    Chief Complaint:  Chief Complaint  Patient presents with  . nasal drainage  . mucus     HPI: 56 y.o. year old female presents with above.   She had OV with me on 03/17/2017. At that visit she reported the following: 03/17/2017: She states all these symptoms started about a week ago or maybe a little bit more. She has had some scratchy throat and sore throat. A lot of drainage in her throat. Her cough is just secondary to drainage in her throat no deep chest congestion. Also having some runny nose and nasal congestion. No significant earache. No fevers or chills. At that visit she was prescribed azithromycin.  TODAY: Today she reports that she took the azithromycin as directed and completed all of it. However says that she never really got any better. Says that her throat does not feel sore anymore. However continues to feel drainage in her throat and continues with some cough secondary to this drainage in throat. Says she continues to have some nasal congestion/mucus in nose. Has had no fevers or chills.   Home Meds:   Outpatient Medications Prior to Visit  Medication Sig Dispense Refill  . aspirin 81 MG tablet Take 81 mg by mouth daily.      Marland Kitchen atenolol (TENORMIN) 25 MG tablet TAKE 1 TABLET (25 MG TOTAL) BY MOUTH DAILY. 90 tablet 0  . calcium-vitamin D (OSCAL WITH D) 250-125 MG-UNIT per tablet Take 1 tablet by mouth daily.      . celecoxib (CELEBREX) 100 MG capsule TAKE 1 CAPSULE TWICE A DAY 60 capsule 4  . cetirizine (ZYRTEC) 10 MG tablet Take 10 mg by mouth daily.    . diazepam (VALIUM) 5 MG tablet TAKE 1 TABLET BY MOUTH TWICE A DAY AS NEEDED FOR ANXIETY 60 tablet 0  . DULoxetine (CYMBALTA) 60 MG capsule TAKE 1 CAPSULE(60 MG) BY MOUTH DAILY 30 capsule 5  . fish oil-omega-3 fatty acids 1000 MG capsule Take 2 g by mouth 2 (two) times daily.      . hydrochlorothiazide  (HYDRODIURIL) 25 MG tablet TAKE 1 TABLET BY MOUTH EVERY DAY 90 tablet 1  . losartan (COZAAR) 100 MG tablet TAKE 1 TABLET EVERY DAY 90 tablet 3  . omeprazole (PRILOSEC) 20 MG capsule Take 1 capsule (20 mg total) by mouth daily. 90 capsule 3  . promethazine (PHENERGAN) 25 MG tablet Take 1 tablet (25 mg total) by mouth every 8 (eight) hours as needed for nausea or vomiting. 30 tablet 0  . sulfamethoxazole-trimethoprim (BACTRIM DS,SEPTRA DS) 800-160 MG tablet TAKE 1 TABLET BY MOUTH ONCE AFTER INTERCOURSE AS NEEDED 30 tablet 2  . Vitamins-Lipotropics (LIPOFLAVOVIT) TABS Take by mouth.    . zolpidem (AMBIEN) 10 MG tablet TAKE 1 TABLET BY MOUTH EVERY NIGHT AT BEDTIME AS NEEDED FOR SLEEP 30 tablet 3  . azithromycin (ZITHROMAX) 250 MG tablet Day 1: Take 2 daily. Days 2 - 5: Take 1 daily. 6 tablet 0   No facility-administered medications prior to visit.     Allergies:  Allergies  Allergen Reactions  . Norvasc [Amlodipine Besylate] Swelling and Rash      Review of Systems: See HPI for pertinent ROS. All other ROS negative.    Physical Exam: Blood pressure (!) 148/100, pulse 85, temperature 98 F (36.7 C), temperature source Oral, resp. rate 14, weight 64.1 kg (141 lb 6.4 oz), SpO2  98 %., Body mass index is 24.27 kg/m. General:  WNWD WF. Appears in no acute distress. HEENT: Normocephalic, atraumatic, eyes without discharge, sclera non-icteric, nares are without discharge. Bilateral auditory canals clear, TM's are without perforation, pearly grey and translucent with reflective cone of light bilaterally. Oral cavity moist, posterior pharynx without exudate, erythema, peritonsillar abscess.  Neck: Supple. No thyromegaly. No lymphadenopathy. Lungs: Clear bilaterally to auscultation without wheezes, rales, or rhonchi. Breathing is unlabored. Heart: Regular rhythm. No murmurs, rubs, or gallops. Msk:  Strength and tone normal for age. Extremities/Skin: Warm and dry. Neuro: Alert and oriented X 3.  Moves all extremities spontaneously. Gait is normal. CNII-XII grossly in tact. Psych:  Responds to questions appropriately with a normal affect.     ASSESSMENT AND PLAN:  56 y.o. year old female with   1. Bacterial respiratory infection She is to take antibiotic as directed. Follow-up if symptoms do not resolve upon completion of this antibiotic. - cefdinir (OMNICEF) 300 MG capsule; Take 1 capsule (300 mg total) by mouth 2 (two) times daily.  Dispense: 20 capsule; Refill: 0    Signed, [address] [place], [place], [facility] 03/24/2017 5:07 PM

## 2017-03-24 NOTE — Telephone Encounter (Signed)
Patient called and left a message that she was not feeling good and is requesting antibiotic be called in. I called patient and she states she is still sick has not had any relief with the antibiotic I explained to patient she can take otc mucinex as well as delsym. Patient requested rx be called in I explaiend she would need to be seen again if she is still sick after taking the antibiotic. Patient was also offered an appt for 415 today in which pt stated she would take

## 2017-04-14 ENCOUNTER — Ambulatory Visit: Payer: 59 | Admitting: Physician Assistant

## 2017-04-26 ENCOUNTER — Other Ambulatory Visit: Payer: Self-pay | Admitting: Physician Assistant

## 2017-04-28 NOTE — Telephone Encounter (Signed)
Medication refill for one time only.  Patient needs to be seen.  Letter sent for patient to call and schedule 

## 2017-05-08 ENCOUNTER — Encounter: Payer: Self-pay | Admitting: Physician Assistant

## 2017-05-26 ENCOUNTER — Other Ambulatory Visit: Payer: Self-pay | Admitting: Physician Assistant

## 2017-05-26 ENCOUNTER — Telehealth: Payer: Self-pay | Admitting: Physician Assistant

## 2017-05-26 MED ORDER — DIAZEPAM 5 MG PO TABS: ORAL_TABLET | ORAL | 2 refills | Status: DC

## 2017-05-26 NOTE — Addendum Note (Signed)
Addended by: Vonna Kotyk A on: 05/26/2017 05:21 PM   Modules accepted: Orders

## 2017-05-26 NOTE — Telephone Encounter (Signed)
Last Ov 12/18/2016 Last refill 02/21/2017 Ok to refill?

## 2017-05-26 NOTE — Telephone Encounter (Signed)
RX CALLED INTO PHARMACY

## 2017-05-26 NOTE — Addendum Note (Signed)
Addended by: Vonna Kotyk A on: 05/26/2017 10:46 AM   Modules accepted: Orders

## 2017-05-26 NOTE — Telephone Encounter (Signed)
Pt needs refill on valium.

## 2017-05-26 NOTE — Telephone Encounter (Signed)
Approved.  #60+2. (FYI---I saw note regarding "dismissed from department" but she has had a lot of severe medical problems, surgery, etc and out of work secondary to this) I will refill her medications

## 2017-06-03 ENCOUNTER — Other Ambulatory Visit: Payer: Self-pay | Admitting: Physician Assistant

## 2017-06-04 ENCOUNTER — Other Ambulatory Visit: Payer: Self-pay

## 2017-06-04 NOTE — Telephone Encounter (Signed)
Approved. #[id number]. 

## 2017-06-04 NOTE — Telephone Encounter (Signed)
Last OV 9/17 Last refill 02/06/2017 Ok to refill

## 2017-06-05 MED ORDER — ZOLPIDEM TARTRATE 10 MG PO TABS: 10.0000 mg | ORAL_TABLET | Freq: Every evening | ORAL | 3 refills | Status: DC | PRN

## 2017-06-05 NOTE — Telephone Encounter (Signed)
rx called in to [facility] on [address]

## 2017-06-06 ENCOUNTER — Other Ambulatory Visit: Payer: Self-pay | Admitting: Physician Assistant

## 2017-06-07 ENCOUNTER — Other Ambulatory Visit: Payer: Self-pay | Admitting: Physician Assistant

## 2017-06-09 ENCOUNTER — Other Ambulatory Visit: Payer: Self-pay

## 2017-06-09 MED ORDER — OMEPRAZOLE 20 MG PO CPDR: 20.0000 mg | DELAYED_RELEASE_CAPSULE | Freq: Every day | ORAL | 3 refills | Status: DC

## 2017-06-09 NOTE — Telephone Encounter (Signed)
Rx filled per protocol  

## 2017-06-14 ENCOUNTER — Other Ambulatory Visit: Payer: Self-pay | Admitting: Physician Assistant

## 2017-06-19 ENCOUNTER — Other Ambulatory Visit: Payer: Self-pay | Admitting: Physician Assistant

## 2017-06-19 ENCOUNTER — Encounter: Payer: Self-pay | Admitting: Physician Assistant

## 2017-06-23 ENCOUNTER — Other Ambulatory Visit: Payer: Self-pay

## 2017-06-23 MED ORDER — CELECOXIB 100 MG PO CAPS: 100.0000 mg | ORAL_CAPSULE | Freq: Two times a day (BID) | ORAL | 0 refills | Status: DC

## 2017-06-23 NOTE — Telephone Encounter (Deleted)
Patient has been discharged from practice until balance is paid in full

## 2017-07-01 ENCOUNTER — Other Ambulatory Visit: Payer: Self-pay | Admitting: Physician Assistant

## 2017-07-03 ENCOUNTER — Other Ambulatory Visit: Payer: Self-pay | Admitting: Physician Assistant

## 2017-07-10 ENCOUNTER — Other Ambulatory Visit: Payer: Self-pay | Admitting: Physician Assistant

## 2017-07-19 ENCOUNTER — Other Ambulatory Visit: Payer: Self-pay | Admitting: Family Medicine

## 2017-07-20 ENCOUNTER — Other Ambulatory Visit: Payer: Self-pay | Admitting: Physician Assistant

## 2017-07-21 NOTE — Telephone Encounter (Signed)
Refill appropriate 

## 2017-07-31 ENCOUNTER — Other Ambulatory Visit: Payer: Self-pay

## 2017-07-31 ENCOUNTER — Other Ambulatory Visit: Payer: Self-pay | Admitting: Physician Assistant

## 2017-07-31 DIAGNOSIS — F419 Anxiety disorder, unspecified: Secondary | ICD-10-CM

## 2017-07-31 DIAGNOSIS — F320 Major depressive disorder, single episode, mild: Secondary | ICD-10-CM

## 2017-07-31 MED ORDER — DULOXETINE HCL 60 MG PO CPEP: ORAL_CAPSULE | ORAL | 0 refills | Status: DC

## 2017-08-01 ENCOUNTER — Other Ambulatory Visit: Payer: Self-pay | Admitting: Physician Assistant

## 2017-08-22 ENCOUNTER — Other Ambulatory Visit: Payer: Self-pay | Admitting: Physician Assistant

## 2017-08-25 NOTE — Telephone Encounter (Signed)
Last OV 12/18/2016 Last refill 05/26/2017 Okay to refill?

## 2017-08-26 NOTE — Telephone Encounter (Signed)
Approved # [id number] + 2 

## 2017-08-27 NOTE — Telephone Encounter (Signed)
RX CALLED IN

## 2017-08-29 ENCOUNTER — Other Ambulatory Visit: Payer: Self-pay | Admitting: Physician Assistant

## 2017-08-29 DIAGNOSIS — F320 Major depressive disorder, single episode, mild: Secondary | ICD-10-CM

## 2017-08-29 DIAGNOSIS — F419 Anxiety disorder, unspecified: Secondary | ICD-10-CM

## 2017-09-01 ENCOUNTER — Other Ambulatory Visit: Payer: Self-pay

## 2017-09-01 MED ORDER — ZOLPIDEM TARTRATE ER 12.5 MG PO TBCR: 12.5000 mg | EXTENDED_RELEASE_TABLET | Freq: Every evening | ORAL | 0 refills | Status: DC | PRN

## 2017-09-01 NOTE — Telephone Encounter (Signed)
Patient was inoffice with her husband PCP gave verbal to approve this. Rx sent to pharmacy

## 2017-09-25 ENCOUNTER — Other Ambulatory Visit: Payer: Self-pay | Admitting: Physician Assistant

## 2017-09-25 DIAGNOSIS — F419 Anxiety disorder, unspecified: Secondary | ICD-10-CM

## 2017-09-25 DIAGNOSIS — F320 Major depressive disorder, single episode, mild: Secondary | ICD-10-CM

## 2017-09-27 ENCOUNTER — Other Ambulatory Visit: Payer: Self-pay | Admitting: Physician Assistant

## 2017-09-29 NOTE — Telephone Encounter (Signed)
Last OV 12/18/2016 Last refill  09/01/2017 Ok to refill?

## 2017-10-07 ENCOUNTER — Other Ambulatory Visit: Payer: Self-pay | Admitting: Physician Assistant

## 2017-10-08 NOTE — Telephone Encounter (Signed)
Approved for refills to last until 12/18/2017.

## 2017-10-08 NOTE — Telephone Encounter (Signed)
Last OV 12/18/2016 Last refill 07/21/2017 Ok to refill?

## 2017-10-09 ENCOUNTER — Other Ambulatory Visit: Payer: Self-pay | Admitting: Physician Assistant

## 2017-10-24 ENCOUNTER — Other Ambulatory Visit: Payer: Self-pay | Admitting: Physician Assistant

## 2017-10-24 DIAGNOSIS — F419 Anxiety disorder, unspecified: Secondary | ICD-10-CM

## 2017-10-24 DIAGNOSIS — F320 Major depressive disorder, single episode, mild: Secondary | ICD-10-CM

## 2017-10-25 ENCOUNTER — Other Ambulatory Visit: Payer: Self-pay | Admitting: Physician Assistant

## 2017-11-18 ENCOUNTER — Other Ambulatory Visit: Payer: Self-pay | Admitting: Physician Assistant

## 2017-11-19 NOTE — Telephone Encounter (Signed)
Last OV 12/18/2016 Last refill 08/27/2017 Ok to refill?

## 2017-11-19 NOTE — Telephone Encounter (Signed)
She is due for office visit and labs.  Schedule OV.

## 2017-11-20 ENCOUNTER — Other Ambulatory Visit: Payer: Self-pay

## 2017-11-20 ENCOUNTER — Ambulatory Visit: Payer: Self-pay | Admitting: Physician Assistant

## 2017-11-20 ENCOUNTER — Encounter: Payer: Self-pay | Admitting: Physician Assistant

## 2017-11-20 ENCOUNTER — Other Ambulatory Visit: Payer: Self-pay | Admitting: Physician Assistant

## 2017-11-20 VITALS — BP 128/86 | HR 76 | Temp 98.1°F | Resp 14 | Ht 64.0 in | Wt 138.2 lb

## 2017-11-20 DIAGNOSIS — F419 Anxiety disorder, unspecified: Secondary | ICD-10-CM

## 2017-11-20 DIAGNOSIS — G894 Chronic pain syndrome: Secondary | ICD-10-CM

## 2017-11-20 DIAGNOSIS — G4700 Insomnia, unspecified: Secondary | ICD-10-CM

## 2017-11-20 DIAGNOSIS — I10 Essential (primary) hypertension: Secondary | ICD-10-CM

## 2017-11-20 LAB — BASIC METABOLIC PANEL WITH GFR
BUN: 13 mg/dL (ref 7–25)
CHLORIDE: 85 mmol/L — AB (ref 98–110)
CO2: 27 mmol/L (ref 20–32)
CREATININE: 1.04 mg/dL (ref 0.50–1.05)
Calcium: 10.4 mg/dL (ref 8.6–10.4)
GFR, Est African American: 69 mL/min/{1.73_m2} (ref >=60)
GFR, Est Non African American: 60 mL/min/{1.73_m2} (ref >=60)
GLUCOSE: 108 mg/dL — AB (ref 65–99)
Potassium: 4.9 mmol/L (ref 3.5–5.3)
SODIUM: 124 mmol/L — AB (ref 135–146)

## 2017-11-20 LAB — EXTRA LAV TOP TUBE

## 2017-11-20 NOTE — Progress Notes (Signed)
Patient ID: Erika Harvey MRN: [id number], DOB: 1961-02-03, 57 y.o. Date of Encounter: @DATE @  Chief Complaint:  Chief Complaint  Patient presents with  . F/U Depression    HPI: 57 y.o. year old female  presents for f/u OV to f/u Depression.    06/19/2016: Over the past 2 years she had been dealing with back pain. Ultimately underwent back surgery. Finally has gotten relief of the back pain. She was on short-term disability for a time during all of that and after her surgery. She worked with [employer]. Also at home she was the one that did all of their finances and took care of all of that at home.  During the time that she was requiring a lot of pain meds etc. and was feeling pain nausea and not feeling well she was unable to manage the family's finances. She came and had a visit with Dr. Dennard Schaumann 04/09/16. At that time reported that their credit cards were maxed out. Their credit score had fallen dramatically. At that time reported that her job may be in jeopardy. As a result of the mounting financial pressures and work pressures she had developed depression. At that visit she reported decreased energy, poor concentration, intrusive thoughts, panic attacks, trouble sleeping, anhedenia. Complained of depression. Was using Xanax 3 times a day to help control the anxiety. Was meeting with an agency later that day to help consolidate debt into 12 manageable payments. At that time stated that emotionally she felt unable to cope and was finding it difficult to get her life back on track.  At that visit Dr. Dennard Schaumann planned to wean off of Celexa and meanwhile start Trintelix 5 mg for one week and then increase to 10 mg in one week.  However on 05/07/16 patient called and reported that she had lost her job and would not be able to afford the Trentelex once refill due. Therefore was needing a less expensive alternative.  At that time Dr. Dennard Schaumann switched her to Zoloft 50 mg.  Today patient  states that when she started the Zoloft she could tell some difference and did feel like it was helping some. However only occasionally she "feels like her old self." Today she reports that she was laid off. She says that actually she thinks that her job was very surprised when she did return to work at all. Says that they had already gotten other employees to cover her job duties and then when she returned there really was minimal work for her to do. Today she reports that she went ahead and decided to use her 401(k).  Had to use all of her 401(k). When talking with her --in addition to her own emotions-- she also feels that she has let down her husband and has now put him in his bad position because she is the one that was responsible for the home finances and he didn't realize that during that period of time she was unable to take care of things and manage things. Says that now he totally understands the entire situation and everything is out in the open and clear and has been dealt with but still she feels this responsibility/burden. Says that she has updated her resume and is trying to find a new job. Says that generally she was always happy-go-lucky type personality and just doesn't feel that way now. Says every now and then she will laugh and feel that way and will tell her husband--"[name], did you notice that" but  says that only lasts a fleeting moment and then she is back to feeling depressed. Says she knows in her mind that everything is now cleared and taken care of and that everything will be okay but she just cannot get herself to feel better.  She is still taking all of her blood pressure medications as directed. Is having no adverse effects. No lightheadedness.  AT THAT OV --- Increased Zoloft to 100mg  QD   08/01/2016: Reviewed her chart. After her last visit she also had phone call message on 07/26/16 which I have reviewed. However she states that at the time of that phone call she was  just told to schedule an office visit and was not told about weaning off of Zoloft and starting Cymbalta.  She had called at that time to report that she had been on the Zoloft 100 mg. She reported that she was still feeling depressed, still having a hard time finding a job, still not feeling motivated to do anything. Here for follow-up visit to discuss further treatment options.  Confirmed with her today that the only medications that she has been on for this so far: Celexa--- which she was on for years up until recently 35--- was just recently started and then increased up to the 100 mg dose   Today she states that she still does not have a job. Says that she has had 3 interviews. Says that she is now thinking that she is just going to have to get another job that is not have to do with mortgages. Says that she has even used recruiters and that the market for employment regarding mortgages is just not great right now. She's gonna have to just get a different type of job even though it will not pay as well.  Says "On top of it all,a tree fell in the yard and fell across/on the fence-- and they can't afford to get it removed right now."  Says that she is not crying as much as she was in the past but definitely still feels depressed and does not feel that the Zoloft is doing anything.  Assessment/Plan at [place] 08/01/2016: 1. Mild single current episode of major depressive disorder ([place]) Will wean off Zoloft --- take 50mg  for 2 weeks----then start Cymbalta 60mg . (Hopefully SNRI will work for her) - sertraline (ZOLOFT) 50 MG tablet; Take 1 tablet (50 mg total) by mouth daily.  Dispense: 30 tablet; Refill: 1 - DULoxetine (CYMBALTA) 60 MG capsule; Take 1 capsule (60 mg total) by mouth daily.  Dispense: 30 capsule; Refill: 1   10/10/2016: Since her last office visit-- her husband had a bicep tendon rupture and had surgery.  Pt states that they were able to attach part of the tendon but could not  fully attach things so he will not regain 100% function. Says that when she did her taxes, she was excited because it look liked they should be getting $4500 back.  However they still have not gotten that money back because the IRS has continued to review their tax returns.  Was excited to get that money because that way she could use that to pay the house payment, car payment, and credit card payment.  Her Aunt ended up paying those payments for this month.  Recently found a job that was supposed to start this upcoming Tuesday. Then, right when she was walking in the door for her office visit here this morning-- she got a phone call from her recruiter saying that  the company had changed their mind. Just found out minutes ago that she does not have a job starting Tuesday. Obviously is upset about this.  Otherwise though does feel like the Cymbalta has been helping. Says that she definitely has been able to think more clearly and focus. Also today she seems a lot more like her old self than she has at recent OVs.   12/18/2016: Today patient gives me update regarding her husband as well. Says that it was going to be time for him to return to work and wife knew he was going to have major problems returning to work because his work involves very heavy lifting and his right bicep is not fully functioning. Says that she went with him to appt and explained her concern and orthopedics wrote for him to do 6 weeks of physical therapy -- he is out of work another 6 weeks to do this physical therapy. She states that regarding her own update-- she says that she definitely has fully accepted that she will not even consider returning to any type of job/work similar to her recent jobs. Says that she definitely does not want to even have to deal with that stress level again and she is totally fine with looking for a much lower paying job-- just something to have some income. Says that she has heard of 1 really good temp  agency and is going to go through them. Says that the reason that she made today's appointment is that she finally realized something. Reviewed the fact that there was a time where she was on pain meds etc. that were causing her to feel very nauseous and very sick etc. Says that she has continued to have some periods of time where she would feel some slight nausea "but not  The loopy type nausea" that she was having with the pain pills. Says that she finally realized that this kind of nausea that she has continued to have at times-- occurs when her body is withdrawing from Xanax. Says that anytime the medicine is wearing off, she can feel increase in her anxiety, decrease in her appetite, and nauseous feeling. This will happen during the day if the medicine has worn off and she has not had another dose and also has definitely noticed that this occurs if her medicine has run out and she has not picked up the refill yet (and goes day(s) without medication. Says that she has talked to multiple friends who have told her that they experienced the same thing but that it does not occur with using Valium. Is wanting to switch the Xanax to Valium. Reviewed the fact that even prior to all of these back issues and other problems-- that she had always required Xanax for many years and that in the past even when she was working in [place] she would have to take the Xanax even throughout the day in order to calm down enough to focus and do her work. It never made her feel "loopy or out of it". Rather, she had told me in the past that without the medicine she felt too anxious to sit still and focus. She says that even right now even though she is not having to work a job that she definitely can tell she has to have medicine or else she feels increased anxiety--- says "that's just the way I am--I can't control it -- that 's just the way my body is." No other concerns to address today.  11/20/2017: Today I reviewed with her  that she had been here for some respiratory infections, but it has been almost a complete year since her last routine visit.   So I asked for updates regarding their situation. She states that regarding her husband--  they have completed the Worker's Compensation case.  That means that he received settlement money. He is doing no work.  States that they are starting to look into disability.  States that the same company that helped him with the [employer] is helping him look into the disability. Reports that she also currently still has no job and is doing no work. She states that, in a way, all of it has been a blessing because through this whole process her aunt has helped them so much and they are going to pay her back now that they have gotten the [employer].  Says that her aunt's husband had diabetes and recently had to have amputation and had other complications and recently passed away.   States that she and Kasandra Knudsen helped take care of him and helped her Aunt a lot during all of that.  Also says that feels that this whole thing has been somewhat been a blessing because her and her husband's relationship is the best it has been and says that her husband is so much happier since he is not working. Says that they finally are at peace with things and taking 1 day at the time and she knows that eventually things will work out for her to find some type of job. Reports that she currently has no medical insurance now.  Says that this just happened just recently because the Worker's Comp. case got settled.   Says that she did find out about an app that she is using for prescriptions and is getting medication very low price and is taking all medications as directed.   Has not had to stop any medicine secondary to cost. She reports that everything regarding her health seems to be stable. She is taking blood pressure medications as directed and is having no lightheadedness or other  adverse effects. She is taking the Cymbalta daily and this is controlling her anxiety and keeping her mood stable and is causing no adverse effects. The Ambien continues to work well to help with her insomnia. She has no specific concerns to address today.    Past Medical History:  Diagnosis Date  . Allergy   . Anxiety   . Chronic low back pain   . Fibroid 03/30/2015  . Frequent UTI   . Hypertension   . Vaginal bleeding 03/30/2015     Home Meds: Outpatient Medications Prior to Visit  Medication Sig Dispense Refill  . aspirin 81 MG tablet Take 81 mg by mouth daily.      Marland Kitchen atenolol (TENORMIN) 25 MG tablet TAKE 1 TABLET BY MOUTH EVERY DAY 90 tablet 0  . calcium-vitamin D (OSCAL WITH D) 250-125 MG-UNIT per tablet Take 1 tablet by mouth daily.      . celecoxib (CELEBREX) 100 MG capsule TAKE ONE CAPSULE BY MOUTH TWICE A DAY 60 capsule 2  . cetirizine (ZYRTEC) 10 MG tablet Take 10 mg by mouth daily.    . diazepam (VALIUM) 5 MG tablet TAKE 1 TABLET TWICE A DAY AS NEEDED FOR ANXIETY 60 tablet 2  . DULoxetine (CYMBALTA) 60 MG capsule TAKE ONE CAPSULE BY MOUTH DAILY 30 capsule 0  . fish oil-omega-3 fatty acids 1000 MG capsule Take  2 g by mouth 2 (two) times daily.      . hydrochlorothiazide (HYDRODIURIL) 25 MG tablet TAKE 1 TABLET BY MOUTH EVERY DAY 90 tablet 0  . losartan (COZAAR) 100 MG tablet TAKE 1 TABLET BY MOUTH EVERY DAY 90 tablet 0  . omeprazole (PRILOSEC) 20 MG capsule Take 1 capsule (20 mg total) by mouth daily. 90 capsule 3  . promethazine (PHENERGAN) 25 MG tablet Take 1 tablet (25 mg total) by mouth every 8 (eight) hours as needed for nausea or vomiting. 30 tablet 0  . Vitamins-Lipotropics (LIPOFLAVOVIT) TABS Take by mouth.    . zolpidem (AMBIEN CR) 12.5 MG CR tablet TAKE 1 TABLET EVERY DAY AT BEDTIME AS NEEDED FOR SLEEP 30 tablet 5  . cefdinir (OMNICEF) 300 MG capsule Take 1 capsule (300 mg total) by mouth 2 (two) times daily. 20 capsule 0  . sulfamethoxazole-trimethoprim (BACTRIM  DS,SEPTRA DS) 800-160 MG tablet TAKE 1 TABLET BY MOUTH ONCE AFTER INTERCOURSE AS NEEDED 30 tablet 2   No facility-administered medications prior to visit.     Allergies:  Allergies  Allergen Reactions  . Norvasc [Amlodipine Besylate] Swelling and Rash    Social History   Socioeconomic History  . Marital status: Married    Spouse name: Not on file  . Number of children: Not on file  . Years of education: Not on file  . Highest education level: Not on file  Occupational History  . Not on file  Social Needs  . Financial resource strain: Not on file  . Food insecurity:    Worry: Not on file    Inability: Not on file  . Transportation needs:    Medical: Not on file    Non-medical: Not on file  Tobacco Use  . Smoking status: Never Smoker  . Smokeless tobacco: Never Used  Substance and Sexual Activity  . Alcohol use: Yes    Alcohol/week: 3.0 oz    Types: 5 Cans of beer per week  . Drug use: No  . Sexual activity: Yes    Birth control/protection: Pill  Lifestyle  . Physical activity:    Days per week: Not on file    Minutes per session: Not on file  . Stress: Not on file  Relationships  . Social connections:    Talks on phone: Not on file    Gets together: Not on file    Attends religious service: Not on file    Active member of club or organization: Not on file    Attends meetings of clubs or organizations: Not on file    Relationship status: Not on file  . Intimate partner violence:    Fear of current or ex partner: Not on file    Emotionally abused: Not on file    Physically abused: Not on file    Forced sexual activity: Not on file  Other Topics Concern  . Not on file  Social History Narrative  . Not on file    Family History  Problem Relation Age of Onset  . Cancer Mother 30       Breast Cancer  . Heart disease Father 72  . Diabetes Father   . Colon cancer Neg Hx   . Colon polyps Neg Hx      Review of Systems:  See HPI for pertinent ROS. All other  ROS negative.    Physical Exam: Blood pressure 128/86, pulse 76, temperature 98.1 F (36.7 C), temperature source Oral, resp. rate 14, height 5\' 4"  (  1.626 m), weight 62.7 kg (138 lb 3.2 oz), SpO2 99 %., Body mass index is 23.72 kg/m. General: WNWD WF. Appears in no acute distress. Neck: Supple. No thyromegaly. No lymphadenopathy. Lungs: Clear bilaterally to auscultation without wheezes, rales, or rhonchi. Breathing is unlabored. Heart: RRR with S1 S2. No murmurs, rubs, or gallops. Abdomen: Soft, non-tender, non-distended with normoactive bowel sounds. No hepatomegaly. No rebound/guarding. No obvious abdominal masses. Musculoskeletal:  Strength and tone normal for age. Extremities/Skin: Warm and dry.  No LE edema.  Neuro: Alert and oriented X 3. Moves all extremities spontaneously. Gait is normal. CNII-XII grossly in tact. Psych:  Responds to questions appropriately with a normal affect.     ASSESSMENT AND PLAN:  57 y.o. year old female with   1. Essential hypertension 11/20/2017: Blood pressure is at goal/controlled.  Continue current medications.  Check lab to monitor. - BASIC METABOLIC PANEL WITH GFR  2. Insomnia, unspecified type 11/20/2017: Insomnia is controlled with using Ambien as needed.  Continue current treatment.  3. Anxiety 11/20/2017: Anxiety is controlled with use of Cymbalta.  Continue current treatment.  4. Chronic pain syndrome 11/20/2017: She has been out of work since she had problems with back pain and back surgery etc.  This is all currently stable.   Will plan routine follow-up in 6 months.  Follow-up sooner if needed.    Signed, [address] [place], [place], [facility] 11/20/2017 9:40 AM

## 2017-11-20 NOTE — Telephone Encounter (Signed)
Last OV 11/20/2017 Last refill 08/27/2017 Ok to refill?

## 2017-11-24 ENCOUNTER — Other Ambulatory Visit: Payer: Self-pay | Admitting: Physician Assistant

## 2017-11-24 DIAGNOSIS — F419 Anxiety disorder, unspecified: Secondary | ICD-10-CM

## 2017-11-24 DIAGNOSIS — F320 Major depressive disorder, single episode, mild: Secondary | ICD-10-CM

## 2017-12-20 ENCOUNTER — Other Ambulatory Visit: Payer: Self-pay | Admitting: Physician Assistant

## 2017-12-23 ENCOUNTER — Other Ambulatory Visit: Payer: Self-pay | Admitting: Physician Assistant

## 2017-12-23 DIAGNOSIS — F419 Anxiety disorder, unspecified: Secondary | ICD-10-CM

## 2017-12-23 DIAGNOSIS — F320 Major depressive disorder, single episode, mild: Secondary | ICD-10-CM

## 2018-01-03 ENCOUNTER — Other Ambulatory Visit: Payer: Self-pay | Admitting: Physician Assistant

## 2018-01-13 ENCOUNTER — Other Ambulatory Visit: Payer: Self-pay

## 2018-01-13 MED ORDER — HYDROCHLOROTHIAZIDE 25 MG PO TABS: 25.0000 mg | ORAL_TABLET | Freq: Every day | ORAL | 0 refills | Status: DC

## 2018-01-20 ENCOUNTER — Other Ambulatory Visit: Payer: Self-pay | Admitting: Physician Assistant

## 2018-01-20 DIAGNOSIS — F320 Major depressive disorder, single episode, mild: Secondary | ICD-10-CM

## 2018-01-20 DIAGNOSIS — F419 Anxiety disorder, unspecified: Secondary | ICD-10-CM

## 2018-01-23 ENCOUNTER — Other Ambulatory Visit: Payer: Self-pay | Admitting: Physician Assistant

## 2018-02-11 ENCOUNTER — Other Ambulatory Visit: Payer: Self-pay | Admitting: Physician Assistant

## 2018-02-12 NOTE — Telephone Encounter (Signed)
Last OV 11/20/2017 Last refill 11/20/2017 Ok to refill?

## 2018-02-14 ENCOUNTER — Other Ambulatory Visit: Payer: Self-pay | Admitting: Physician Assistant

## 2018-02-14 DIAGNOSIS — F419 Anxiety disorder, unspecified: Secondary | ICD-10-CM

## 2018-02-14 DIAGNOSIS — F320 Major depressive disorder, single episode, mild: Secondary | ICD-10-CM

## 2018-03-03 ENCOUNTER — Other Ambulatory Visit: Payer: Self-pay | Admitting: Physician Assistant

## 2018-03-05 ENCOUNTER — Other Ambulatory Visit: Payer: Self-pay | Admitting: Physician Assistant

## 2018-03-17 ENCOUNTER — Other Ambulatory Visit: Payer: Self-pay | Admitting: Physician Assistant

## 2018-03-17 DIAGNOSIS — F419 Anxiety disorder, unspecified: Secondary | ICD-10-CM

## 2018-03-17 DIAGNOSIS — F320 Major depressive disorder, single episode, mild: Secondary | ICD-10-CM

## 2018-03-18 ENCOUNTER — Other Ambulatory Visit: Payer: Self-pay | Admitting: Physician Assistant

## 2018-03-18 NOTE — Telephone Encounter (Signed)
Last OV 11/20/2017 Last refill 09/29/2017 Ok to refill?

## 2018-03-21 ENCOUNTER — Other Ambulatory Visit: Payer: Self-pay | Admitting: Physician Assistant

## 2018-03-31 ENCOUNTER — Other Ambulatory Visit: Payer: Self-pay | Admitting: Physician Assistant

## 2018-04-11 ENCOUNTER — Other Ambulatory Visit: Payer: Self-pay | Admitting: Physician Assistant

## 2018-04-17 ENCOUNTER — Other Ambulatory Visit: Payer: Self-pay | Admitting: Physician Assistant

## 2018-04-17 DIAGNOSIS — F320 Major depressive disorder, single episode, mild: Secondary | ICD-10-CM

## 2018-04-17 DIAGNOSIS — F419 Anxiety disorder, unspecified: Secondary | ICD-10-CM

## 2018-04-20 ENCOUNTER — Other Ambulatory Visit: Payer: Self-pay | Admitting: Physician Assistant

## 2018-04-28 ENCOUNTER — Other Ambulatory Visit: Payer: Self-pay | Admitting: Physician Assistant

## 2018-04-29 ENCOUNTER — Other Ambulatory Visit: Payer: Self-pay | Admitting: Physician Assistant

## 2018-04-29 NOTE — Telephone Encounter (Signed)
Last OV 11/20/2017 Last refill 02/12/2018 Ok to refill?

## 2018-05-05 ENCOUNTER — Other Ambulatory Visit: Payer: Self-pay | Admitting: Physician Assistant

## 2018-05-05 NOTE — Telephone Encounter (Signed)
Last OV 11/20/2017 Last refill 04/30/2018 Ok to refill?

## 2018-05-16 ENCOUNTER — Other Ambulatory Visit: Payer: Self-pay | Admitting: Physician Assistant

## 2018-05-16 DIAGNOSIS — F320 Major depressive disorder, single episode, mild: Secondary | ICD-10-CM

## 2018-05-16 DIAGNOSIS — F419 Anxiety disorder, unspecified: Secondary | ICD-10-CM

## 2018-05-25 ENCOUNTER — Ambulatory Visit: Payer: Self-pay | Admitting: Physician Assistant

## 2018-05-26 ENCOUNTER — Encounter: Payer: Self-pay | Admitting: Family Medicine

## 2018-05-26 ENCOUNTER — Ambulatory Visit: Payer: Self-pay | Admitting: Family Medicine

## 2018-05-26 VITALS — BP 122/82 | HR 75 | Temp 98.9°F | Resp 16 | Ht 64.0 in | Wt 144.5 lb

## 2018-05-26 DIAGNOSIS — F419 Anxiety disorder, unspecified: Secondary | ICD-10-CM

## 2018-05-26 DIAGNOSIS — I10 Essential (primary) hypertension: Secondary | ICD-10-CM

## 2018-05-26 DIAGNOSIS — G4700 Insomnia, unspecified: Secondary | ICD-10-CM

## 2018-05-26 NOTE — Assessment & Plan Note (Signed)
Well managed with ambien CR

## 2018-05-26 NOTE — Assessment & Plan Note (Signed)
Well controlled, no SE of meds

## 2018-05-26 NOTE — Assessment & Plan Note (Signed)
Well controlled, at goal today Taking meds w/o SE  BMP - recheck electrolytes (had hyponatremia) Continue meds

## 2018-05-26 NOTE — Patient Instructions (Signed)
I will call you with results  Please ask your pharmacist to request refills when due

## 2018-05-26 NOTE — Progress Notes (Signed)
Patient ID: Erika Harvey, female    DOB: [birth year], 57 y.o.   MRN: [id number]  PCP: Delsa Grana, PA-C  Chief Complaint  Patient presents with  . Hypertension    Subjective:   Erika Harvey is a 57 y.o. female, presents to clinic with CC of routine f/up for HTN, insomnia and anxiety.  She is taking her blood pressure medicines as prescribed, has been able to afford her medicines with good Rx at [facility], reports excellent compliance, she has had no side effects, denies chest pain, shortness of breath, dizziness, visual disturbances, headaches, lower extremity edema.  History anxiety, she states that this is well controlled with Cymbalta, she has not noticed any side effects from this  Insomnia continue to be adequately managed with Ambien CR   Patient Active Problem List   Diagnosis Date Noted  . Insomnia 09/07/2015  . Osteopenia 08/09/2015  . Vaginal bleeding 03/30/2015  . Fibroid 03/30/2015  . GERD (gastroesophageal reflux disease) 11/10/2014  . Generalized OA 03/29/2014  . Lumbar herniated disc 02/09/2014    Class: Chronic  . Chronic pain syndrome 02/09/2014  . DDD (degenerative disc disease), lumbar 02/09/2014  . Spondylolysis 02/09/2014  . Lumbosacral radiculitis 02/09/2014  . Chronic low back pain   . Hypertension   . Anxiety   . Frequent UTI     Current Meds  Medication Sig  . aspirin 81 MG tablet Take 81 mg by mouth daily.    Marland Kitchen atenolol (TENORMIN) 25 MG tablet TAKE 1 TABLET BY MOUTH DAILY  . calcium-vitamin D (OSCAL WITH D) 250-125 MG-UNIT per tablet Take 1 tablet by mouth daily.    . celecoxib (CELEBREX) 100 MG capsule TAKE ONE CAPSULE BY MOUTH TWICE A DAY  . cetirizine (ZYRTEC) 10 MG tablet Take 10 mg by mouth daily.  . diazepam (VALIUM) 5 MG tablet TAKE ONE TABLET BY MOUTH TWICE A DAY AS NEEDED FOR ANXIETY  . DULoxetine (CYMBALTA) 60 MG capsule TAKE ONE CAPSULE BY MOUTH DAILY  . fish oil-omega-3 fatty acids 1000 MG capsule Take 2 g by  mouth 2 (two) times daily.    . hydrochlorothiazide (HYDRODIURIL) 25 MG tablet TAKE ONE TABLET BY MOUTH DAILY  . losartan (COZAAR) 100 MG tablet TAKE ONE TABLET BY MOUTH DAILY  . omeprazole (PRILOSEC) 20 MG capsule Take 1 capsule (20 mg total) by mouth daily.  . Vitamins-Lipotropics (LIPOFLAVOVIT) TABS Take by mouth.  . zolpidem (AMBIEN CR) 12.5 MG CR tablet TAKE 1 TABLET AT BEDTIME IF NEEDED FOR SLEEP     Review of Systems  Constitutional: Negative.  Negative for activity change, appetite change, fatigue and unexpected weight change.  HENT: Negative.   Eyes: Negative.   Respiratory: Negative.  Negative for shortness of breath.   Cardiovascular: Negative.  Negative for chest pain, palpitations and leg swelling.  Gastrointestinal: Negative.  Negative for abdominal pain and blood in stool.  Endocrine: Negative.   Genitourinary: Negative.   Musculoskeletal: Negative.  Negative for arthralgias, gait problem, joint swelling and myalgias.  Skin: Negative.  Negative for color change, pallor and rash.  Allergic/Immunologic: Negative.   Neurological: Negative.  Negative for dizziness, syncope, weakness and light-headedness.  Hematological: Negative.   Psychiatric/Behavioral: Negative.  Negative for confusion, dysphoric mood, self-injury and suicidal ideas. The patient is not nervous/anxious.        Objective:    Vitals:   05/26/18 1136  BP: 122/82  Pulse: 75  Resp: 16  Temp: 98.9 F (37.2 C)  TempSrc:  Oral  SpO2: 100%  Weight: 144 lb 8 oz (65.5 kg)  Height: 5\' 4"  (1.626 m)      Physical Exam  Constitutional: She appears well-developed and well-nourished. No distress.  HENT:  Head: Normocephalic and atraumatic.  Nose: Nose normal.  Mouth/Throat: Oropharynx is clear and moist.  Eyes: Conjunctivae are normal. Right eye exhibits no discharge. Left eye exhibits no discharge.  Neck: Normal range of motion. No tracheal deviation present.  Cardiovascular: Normal rate, regular  rhythm, normal heart sounds and intact distal pulses. Exam reveals no gallop and no friction rub.  No murmur heard. Pulmonary/Chest: Effort normal and breath sounds normal. No stridor. No respiratory distress. She has no wheezes. She has no rales. She exhibits no tenderness.  Musculoskeletal: Normal range of motion.  Neurological: She is alert. She exhibits normal muscle tone. Coordination normal.  Skin: Skin is warm and dry. No rash noted. She is not diaphoretic.  Psychiatric: She has a normal mood and affect. Her behavior is normal.  Nursing note and vitals reviewed.         Assessment & Plan:   Problem List Items Addressed This Visit      Cardiovascular and Mediastinum   Hypertension - Primary    Well controlled, at goal today Taking meds w/o SE  BMP - recheck electrolytes (had hyponatremia) Continue meds      Relevant Orders   BASIC METABOLIC PANEL WITH GFR     Other   Anxiety    Well controlled, no SE of meds      Insomnia    Well managed with ambien CR         Pt to do BMP today to check electrolytes and renal function, will notify us when she needs refills.   6 month f/up    Delsa Grana, PA-C 05/26/18 12:00 PM

## 2018-05-27 LAB — BASIC METABOLIC PANEL WITH GFR
BUN/Creatinine Ratio: 18 (calc) (ref 6–22)
BUN: 21 mg/dL (ref 7–25)
CALCIUM: 10 mg/dL (ref 8.6–10.4)
CHLORIDE: 100 mmol/L (ref 98–110)
CO2: 29 mmol/L (ref 20–32)
Creat: 1.15 mg/dL — ABNORMAL HIGH (ref 0.50–1.05)
GFR, EST AFRICAN AMERICAN: 61 mL/min/{1.73_m2} (ref >=60)
GFR, Est Non African American: 53 mL/min/{1.73_m2} — ABNORMAL LOW (ref >=60)
GLUCOSE: 82 mg/dL (ref 65–99)
Potassium: 4.2 mmol/L (ref 3.5–5.3)
Sodium: 139 mmol/L (ref 135–146)

## 2018-05-28 ENCOUNTER — Other Ambulatory Visit: Payer: Self-pay | Admitting: Family Medicine

## 2018-05-28 DIAGNOSIS — R7989 Other specified abnormal findings of blood chemistry: Secondary | ICD-10-CM

## 2018-06-08 ENCOUNTER — Other Ambulatory Visit: Payer: Self-pay

## 2018-06-08 DIAGNOSIS — R7989 Other specified abnormal findings of blood chemistry: Secondary | ICD-10-CM

## 2018-06-09 LAB — BASIC METABOLIC PANEL
BUN / CREAT RATIO: 10 (calc) (ref 6–22)
BUN: 13 mg/dL (ref 7–25)
CALCIUM: 9.6 mg/dL (ref 8.6–10.4)
CO2: 27 mmol/L (ref 20–32)
Chloride: 104 mmol/L (ref 98–110)
Creat: 1.34 mg/dL — ABNORMAL HIGH (ref 0.50–1.05)
GLUCOSE: 129 mg/dL — AB (ref 65–99)
POTASSIUM: 3.9 mmol/L (ref 3.5–5.3)
Sodium: 140 mmol/L (ref 135–146)

## 2018-06-11 ENCOUNTER — Other Ambulatory Visit: Payer: Self-pay | Admitting: Family Medicine

## 2018-06-11 DIAGNOSIS — R7989 Other specified abnormal findings of blood chemistry: Secondary | ICD-10-CM

## 2018-06-15 ENCOUNTER — Other Ambulatory Visit: Payer: Self-pay | Admitting: Family Medicine

## 2018-06-15 ENCOUNTER — Other Ambulatory Visit: Payer: Self-pay

## 2018-06-15 DIAGNOSIS — F419 Anxiety disorder, unspecified: Secondary | ICD-10-CM

## 2018-06-15 DIAGNOSIS — F320 Major depressive disorder, single episode, mild: Secondary | ICD-10-CM

## 2018-06-15 MED ORDER — OMEPRAZOLE 20 MG PO CPDR: 20.0000 mg | DELAYED_RELEASE_CAPSULE | Freq: Every day | ORAL | 3 refills | Status: DC

## 2018-06-24 ENCOUNTER — Other Ambulatory Visit: Payer: Self-pay

## 2018-06-24 MED ORDER — OMEPRAZOLE 20 MG PO CPDR: 20.0000 mg | DELAYED_RELEASE_CAPSULE | Freq: Every day | ORAL | 3 refills | Status: DC

## 2018-06-27 ENCOUNTER — Other Ambulatory Visit: Payer: Self-pay | Admitting: Family Medicine

## 2018-06-28 ENCOUNTER — Other Ambulatory Visit: Payer: Self-pay | Admitting: Family Medicine

## 2018-06-29 NOTE — Telephone Encounter (Signed)
Requesting refill    valium  LOV: 05/26/18  LRF:  04/30/18

## 2018-07-28 ENCOUNTER — Other Ambulatory Visit: Payer: Self-pay | Admitting: *Deleted

## 2018-07-28 ENCOUNTER — Encounter: Payer: Self-pay | Admitting: *Deleted

## 2018-07-28 MED ORDER — LOSARTAN POTASSIUM 100 MG PO TABS: 100.0000 mg | ORAL_TABLET | Freq: Every day | ORAL | 0 refills | Status: DC

## 2018-08-20 ENCOUNTER — Other Ambulatory Visit: Payer: Self-pay | Admitting: Family Medicine

## 2018-08-27 ENCOUNTER — Other Ambulatory Visit: Payer: Self-pay | Admitting: *Deleted

## 2018-08-27 MED ORDER — ATENOLOL 25 MG PO TABS: 25.0000 mg | ORAL_TABLET | Freq: Every day | ORAL | 0 refills | Status: DC

## 2018-08-31 ENCOUNTER — Telehealth: Payer: Self-pay | Admitting: Family Medicine

## 2018-08-31 NOTE — Telephone Encounter (Signed)
Patient's pharmacy updated

## 2018-08-31 NOTE — Telephone Encounter (Signed)
pts new pharmacy is [facility]

## 2018-09-07 ENCOUNTER — Telehealth: Payer: Self-pay | Admitting: Family Medicine

## 2018-09-07 NOTE — Telephone Encounter (Signed)
Spoke with patient and informed her that she will need an office visit. Patient verbalized understanding and scheduled office visit.

## 2018-09-07 NOTE — Telephone Encounter (Signed)
Patient calling to question a letter she got in the mail, it says she needs f/u, but she is wondering if she just needs to come in for lab work or an [phone]

## 2018-09-08 ENCOUNTER — Encounter: Payer: Self-pay | Admitting: Family Medicine

## 2018-09-08 ENCOUNTER — Ambulatory Visit: Payer: Self-pay | Admitting: Family Medicine

## 2018-09-08 VITALS — BP 130/80 | HR 75 | Temp 97.6°F | Resp 16 | Ht 64.0 in | Wt 151.2 lb

## 2018-09-08 DIAGNOSIS — F419 Anxiety disorder, unspecified: Secondary | ICD-10-CM

## 2018-09-08 DIAGNOSIS — I10 Essential (primary) hypertension: Secondary | ICD-10-CM

## 2018-09-08 DIAGNOSIS — R7989 Other specified abnormal findings of blood chemistry: Secondary | ICD-10-CM

## 2018-09-08 DIAGNOSIS — G4700 Insomnia, unspecified: Secondary | ICD-10-CM

## 2018-09-08 NOTE — Progress Notes (Signed)
Patient ID: Erika Harvey, female    DOB: 12-27-1960, 58 y.o.   MRN: [id number]  PCP: Delsa Grana, PA-C  Chief Complaint  Patient presents with  . Hypertension    Subjective:   Erika Harvey is a 58 y.o. female, presents to clinic with CC of f/up for HTN, insomnia, anxiety/depression meds.  She is cash pay, currently does not have insurance.   At her last routine office visit to follow-up on these conditions which was last November, her labs were abnormal initially with low sodium and low chloride, when labs were rechecked a week or 2 later she had elevated creatinine and worsening renal function.  She is going to recheck her labs today.  HTN - talking atenolol and losartan - cash price for meds - [employer], commercial for refills Monitoring BP in the past, not checking regularly, not checking at home, no SE of meds No CP, near syncope, palpitations, orthopnea, Le edema, exertional dyspnea  Insomnia - due for refill of ambien CR, still working well, terrible sleep w/o, has been on for a long time, no SE with [place].  Anxiety/Depression/MDD - cymbalta 60 mg, taking valium fairly regularly - twice a day and has been on it for a long time, she has not noticed any difficulty with confusion, fatigue, sedation.  She still has some days her situations will give her breakthrough anxiety or panic feeling but mostly she is well controlled on her to medications and denies any SI, HI, AVH.  She has not talked to a counselor recently or had a psychiatry referral or evaluation  She is wanting all meds to be sent to [facility] because they are the most affordable.  Patient Active Problem List   Diagnosis Date Noted  . Insomnia 09/07/2015  . Osteopenia 08/09/2015  . Vaginal bleeding 03/30/2015  . Fibroid 03/30/2015  . GERD (gastroesophageal reflux disease) 11/10/2014  . Generalized OA 03/29/2014  . Lumbar herniated disc 02/09/2014    Class: Chronic  . Chronic pain syndrome 02/09/2014  .  DDD (degenerative disc disease), lumbar 02/09/2014  . Spondylolysis 02/09/2014  . Lumbosacral radiculitis 02/09/2014  . Chronic low back pain   . Hypertension   . Anxiety   . Frequent UTI     Current Meds  Medication Sig  . aspirin 81 MG tablet Take 81 mg by mouth daily.    Marland Kitchen atenolol (TENORMIN) 25 MG tablet Take 1 tablet (25 mg total) by mouth daily.  . calcium-vitamin D (OSCAL WITH D) 250-125 MG-UNIT per tablet Take 1 tablet by mouth daily.    . celecoxib (CELEBREX) 100 MG capsule TAKE ONE CAPSULE BY MOUTH TWICE A DAY  . diazepam (VALIUM) 5 MG tablet TAKE ONE TABLET BY MOUTH TWICE A DAY AS NEEDED FOR ANXIETY  . DULoxetine (CYMBALTA) 60 MG capsule TAKE ONE CAPSULE BY MOUTH DAILY  . fish oil-omega-3 fatty acids 1000 MG capsule Take 2 g by mouth 2 (two) times daily.    Marland Kitchen losartan (COZAAR) 100 MG tablet Take 1 tablet (100 mg total) by mouth daily.  Marland Kitchen omeprazole (PRILOSEC) 20 MG capsule Take 1 capsule (20 mg total) by mouth daily.  Marland Kitchen zolpidem (AMBIEN CR) 12.5 MG CR tablet TAKE 1 TABLET AT BEDTIME IF NEEDED FOR SLEEP  . [DISCONTINUED] cetirizine (ZYRTEC) 10 MG tablet Take 10 mg by mouth daily.     Review of Systems  Constitutional: Negative.   HENT: Negative.   Eyes: Negative.   Respiratory: Negative.   Cardiovascular: Negative.  Gastrointestinal: Negative.   Endocrine: Negative.   Genitourinary: Negative.   Musculoskeletal: Negative.   Skin: Negative.   Allergic/Immunologic: Negative.   Neurological: Negative.   Hematological: Negative.   Psychiatric/Behavioral: Negative.   All other systems reviewed and are negative.      Objective:    Vitals:   09/08/18 1100  BP: 130/80  Pulse: 75  Resp: 16  Temp: 97.6 F (36.4 C)  TempSrc: Oral  SpO2: 97%  Weight: 151 lb 4 oz (68.6 kg)  Height: 5\' 4"  (1.626 m)      Physical Exam Vitals signs and nursing note reviewed.  Constitutional:      General: She is not in acute distress.    Appearance: Normal appearance. She is  well-developed. She is obese. She is not ill-appearing, toxic-appearing or diaphoretic.  HENT:     Head: Normocephalic and atraumatic.     Right Ear: External ear normal.     Left Ear: External ear normal.     Nose: Nose normal. No congestion or rhinorrhea.     Mouth/Throat:     Mouth: Mucous membranes are moist.     Pharynx: Oropharynx is clear. No oropharyngeal exudate or posterior oropharyngeal erythema.  Eyes:     General: No scleral icterus.       Right eye: No discharge.        Left eye: No discharge.     Conjunctiva/sclera: Conjunctivae normal.     Pupils: Pupils are equal, round, and reactive to light.  Neck:     Musculoskeletal: Normal range of motion.     Trachea: No tracheal deviation.  Cardiovascular:     Rate and Rhythm: Normal rate and regular rhythm.     Pulses: Normal pulses.     Heart sounds: Normal heart sounds. No murmur. No friction rub. No gallop.   Pulmonary:     Effort: Pulmonary effort is normal. No respiratory distress.     Breath sounds: Normal breath sounds. No stridor. No wheezing, rhonchi or rales.  Abdominal:     General: Bowel sounds are normal. There is no distension.     Palpations: Abdomen is soft.     Tenderness: There is no abdominal tenderness. There is no guarding or rebound.  Musculoskeletal: Normal range of motion.     Right lower leg: No edema.     Left lower leg: No edema.  Skin:    General: Skin is warm and dry.     Capillary Refill: Capillary refill takes less than 2 seconds.     Coloration: Skin is not jaundiced or pale.     Findings: No erythema or rash.  Neurological:     Mental Status: She is alert. Mental status is at baseline.     Motor: No weakness or abnormal muscle tone.     Coordination: Coordination normal.     Gait: Gait normal.  Psychiatric:        Attention and Perception: Attention normal.        Mood and Affect: Mood and affect normal. Mood is not anxious.        Speech: Speech normal.        Behavior: Behavior  normal. Behavior is cooperative.        Thought Content: Thought content normal.        Cognition and Memory: Cognition and memory normal.        Judgment: Judgment normal.           Assessment & Plan:  Problem List Items Addressed This Visit      Cardiovascular and Mediastinum   Hypertension - Primary    Compliant with meds, no SE, she can afford current regimen BP at goal today 130/80 Recheck BMP with GFR for renal function and electrolyte she has had some worsening renal function been gradual over the past couple months If better or improving will do med refills and 6 month f/up, pt instructed to check BP several times a week and make sure she is drinking ample and urinating expected amount If any worsening of labs patient may want to seek out a sliding scale clinic or [place] to help her get more medical attention that she would require and this way she could afford it       Relevant Orders   BASIC METABOLIC PANEL WITH GFR (Completed)     Other   Anxiety    Well controlled on meds, cymbalta and valium, no SE of meds I discussed with the pt at length and pharmacology, dependency, possible side effects, cause for sedation and confusion and concerns with Valium use daily so at all possible she was encouraged to decrease doses gradually or use more sparingly.       Insomnia    Well controlled with current meds, ok to refill when due       Other Visit Diagnoses    Elevated serum creatinine       Relevant Orders   BASIC METABOLIC PANEL WITH GFR (Completed)         Delsa Grana, PA-C 09/08/18 11:26 AM

## 2018-09-08 NOTE — Patient Instructions (Addendum)
Depending on labs - we will arrange follow up   Insomnia Insomnia is a sleep disorder that makes it difficult to fall asleep or stay asleep. Insomnia can cause fatigue, low energy, difficulty concentrating, mood swings, and poor performance at work or school. There are three different ways to classify insomnia:  Difficulty falling asleep.  Difficulty staying asleep.  Waking up too early in the morning. Any type of insomnia can be long-term (chronic) or short-term (acute). Both are common. Short-term insomnia usually lasts for three months or less. Chronic insomnia occurs at least three times a week for longer than three months. What are the causes? Insomnia may be caused by another condition, situation, or substance, such as:  Anxiety.  Certain medicines.  Gastroesophageal reflux disease (GERD) or other gastrointestinal conditions.  Asthma or other breathing conditions.  Restless legs syndrome, sleep apnea, or other sleep disorders.  Chronic pain.  Menopause.  Stroke.  Abuse of alcohol, tobacco, or illegal drugs.  Mental health conditions, such as depression.  Caffeine.  Neurological disorders, such as Alzheimer's disease.  An overactive thyroid (hyperthyroidism). Sometimes, the cause of insomnia may not be known. What increases the risk? Risk factors for insomnia include:  Gender. Women are affected more often than men.  Age. Insomnia is more common as you get older.  Stress.  Lack of exercise.  Irregular work schedule or working night shifts.  Traveling between different time zones.  Certain medical and mental health conditions. What are the signs or symptoms? If you have insomnia, the main symptom is having trouble falling asleep or having trouble staying asleep. This may lead to other symptoms, such as:  Feeling fatigued or having low energy.  Feeling nervous about going to sleep.  Not feeling rested in the morning.  Having trouble  concentrating.  Feeling irritable, anxious, or depressed. How is this diagnosed? This condition may be diagnosed based on:  Your symptoms and medical history. Your health care provider may ask about: ? Your sleep habits. ? Any medical conditions you have. ? Your mental health.  A physical exam. How is this treated? Treatment for insomnia depends on the cause. Treatment may focus on treating an underlying condition that is causing insomnia. Treatment may also include:  Medicines to help you sleep.  Counseling or therapy.  Lifestyle adjustments to help you sleep better. Follow these instructions at home: Eating and drinking   Limit or avoid alcohol, caffeinated beverages, and cigarettes, especially close to bedtime. These can disrupt your sleep.  Do not eat a large meal or eat spicy foods right before bedtime. This can lead to digestive discomfort that can make it hard for you to sleep. Sleep habits   Keep a sleep diary to help you and your health care provider figure out what could be causing your insomnia. Write down: ? When you sleep. ? When you wake up during the night. ? How well you sleep. ? How rested you feel the next day. ? Any side effects of medicines you are taking. ? What you eat and drink.  Make your bedroom a dark, comfortable place where it is easy to fall asleep. ? Put up shades or blackout curtains to block light from outside. ? Use a white noise machine to block noise. ? Keep the temperature cool.  Limit screen use before bedtime. This includes: ? Watching TV. ? Using your smartphone, tablet, or computer.  Stick to a routine that includes going to bed and waking up at the same times every  day and night. This can help you fall asleep faster. Consider making a quiet activity, such as reading, part of your nighttime routine.  Try to avoid taking naps during the day so that you sleep better at night.  Get out of bed if you are still awake after 15  minutes of trying to sleep. Keep the lights down, but try reading or doing a quiet activity. When you feel sleepy, go back to bed. General instructions  Take over-the-counter and prescription medicines only as told by your health care provider.  Exercise regularly, as told by your health care provider. Avoid exercise starting several hours before bedtime.  Use relaxation techniques to manage stress. Ask your health care provider to suggest some techniques that may work well for you. These may include: ? Breathing exercises. ? Routines to release muscle tension. ? Visualizing peaceful scenes.  Make sure that you drive carefully. Avoid driving if you feel very sleepy.  Keep all follow-up visits as told by your health care provider. This is important. Contact a health care provider if:  You are tired throughout the day.  You have trouble in your daily routine due to sleepiness.  You continue to have sleep problems, or your sleep problems get worse. Get help right away if:  You have serious thoughts about hurting yourself or someone else. If you ever feel like you may hurt yourself or others, or have thoughts about taking your own life, get help right away. You can go to your nearest emergency department or call:  Your local emergency services (911 in the [place]  A suicide crisis helpline, such as the [place] at 773-301-7420. This is open 24 hours a day. Summary  Insomnia is a sleep disorder that makes it difficult to fall asleep or stay asleep.  Insomnia can be long-term (chronic) or short-term (acute).  Treatment for insomnia depends on the cause. Treatment may focus on treating an underlying condition that is causing insomnia.  Keep a sleep diary to help you and your health care provider figure out what could be causing your insomnia. This information is not intended to replace advice given to you by your health care provider. Make sure you discuss  any questions you have with your health care provider. Document Released: 06/21/2000 Document Revised: 04/03/2017 Document Reviewed: 04/03/2017 Elsevier Interactive Patient Education  2019 [employer]  After being diagnosed with an anxiety disorder, you may be relieved to know why you have felt or behaved a certain way. It is natural to also feel overwhelmed about the treatment ahead and what it will mean for your life. With care and support, you can manage this condition and recover from it. How to cope with anxiety Dealing with stress Stress is your body's reaction to life changes and events, both good and bad. Stress can last just a few hours or it can be ongoing. Stress can play a major role in anxiety, so it is important to learn both how to cope with stress and how to think about it differently. Talk with your health care provider or a counselor to learn more about stress reduction. He or she may suggest some stress reduction techniques, such as:  Music therapy. This can include creating or listening to music that you enjoy and that inspires you.  Mindfulness-based meditation. This involves being aware of your normal breaths, rather than trying to control your breathing. It can be done while sitting or walking.  Centering  prayer. This is a kind of meditation that involves focusing on a word, phrase, or sacred image that is meaningful to you and that brings you peace.  Deep breathing. To do this, expand your stomach and inhale slowly through your nose. Hold your breath for 3-5 seconds. Then exhale slowly, allowing your stomach muscles to relax.  Self-talk. This is a skill where you identify thought patterns that lead to anxiety reactions and correct those thoughts.  Muscle relaxation. This involves tensing muscles then relaxing them. Choose a stress reduction technique that fits your lifestyle and personality. Stress reduction techniques take time and practice.  Set aside 5-15 minutes a day to do them. Therapists can offer training in these techniques. The training may be covered by some insurance plans. Other things you can do to manage stress include:  Keeping a stress diary. This can help you learn what triggers your stress and ways to control your response.  Thinking about how you respond to certain situations. You may not be able to control everything, but you can control your reaction.  Making time for activities that help you relax, and not feeling guilty about spending your time in this way. Therapy combined with coping and stress-reduction skills provides the best chance for successful treatment. Medicines Medicines can help ease symptoms. Medicines for anxiety include:  Anti-anxiety drugs.  Antidepressants.  Beta-blockers. Medicines may be used as the main treatment for anxiety disorder, along with therapy, or if other treatments are not working. Medicines should be prescribed by a health care provider. Relationships Relationships can play a big part in helping you recover. Try to spend more time connecting with trusted friends and family members. Consider going to couples counseling, taking family education classes, or going to family therapy. Therapy can help you and others better understand the condition. How to recognize changes in your condition Everyone has a different response to treatment for anxiety. Recovery from anxiety happens when symptoms decrease and stop interfering with your daily activities at home or work. This may mean that you will start to:  Have better concentration and focus.  Sleep better.  Be less irritable.  Have more energy.  Have improved memory. It is important to recognize when your condition is getting worse. Contact your health care provider if your symptoms interfere with home or work and you do not feel like your condition is improving. Where to find help and support: You can get help and support  from these sources:  Self-help groups.  Online and [employer].  A trusted spiritual leader.  Couples counseling.  Family education classes.  Family therapy. Follow these instructions at home:  Eat a healthy diet that includes plenty of vegetables, fruits, whole grains, low-fat dairy products, and lean protein. Do not eat a lot of foods that are high in solid fats, added sugars, or salt.  Exercise. Most adults should do the following: ? Exercise for at least 150 minutes each week. The exercise should increase your heart rate and make you sweat (moderate-intensity exercise). ? Strengthening exercises at least twice a week.  Cut down on caffeine, tobacco, alcohol, and other potentially harmful substances.  Get the right amount and quality of sleep. Most adults need 7-9 hours of sleep each night.  Make choices that simplify your life.  Take over-the-counter and prescription medicines only as told by your health care provider.  Avoid caffeine, alcohol, and certain over-the-counter cold medicines. These may make you feel worse. Ask your pharmacist which medicines to avoid.  Keep all follow-up visits as told by your health care provider. This is important. Questions to ask your health care provider  Would I benefit from therapy?  How often should I follow up with a health care provider?  How long do I need to take medicine?  Are there any long-term side effects of my medicine?  Are there any alternatives to taking medicine? Contact a health care provider if:  You have a hard time staying focused or finishing daily tasks.  You spend many hours a day feeling worried about everyday life.  You become exhausted by worry.  You start to have headaches, feel tense, or have nausea.  You urinate more than normal.  You have diarrhea. Get help right away if:  You have a racing heart and shortness of breath.  You have thoughts of hurting yourself or others. If  you ever feel like you may hurt yourself or others, or have thoughts about taking your own life, get help right away. You can go to your nearest emergency department or call:  Your local emergency services (911 in the [place]  A suicide crisis helpline, such as the [place] at (224)063-0436. This is open 24-hours a day. Summary  Taking steps to deal with stress can help calm you.  Medicines cannot cure anxiety disorders, but they can help ease symptoms.  Family, friends, and partners can play a big part in helping you recover from an anxiety disorder. This information is not intended to replace advice given to you by your health care provider. Make sure you discuss any questions you have with your health care provider. Document Released: 06/18/2016 Document Revised: 06/18/2016 Document Reviewed: 06/18/2016 Elsevier Interactive Patient Education  2019 [employer].     Hypertension Hypertension, commonly called high blood pressure, is when the force of blood pumping through the arteries is too strong. The arteries are the blood vessels that carry blood from the heart throughout the body. Hypertension forces the heart to work harder to pump blood and may cause arteries to become narrow or stiff. Having untreated or uncontrolled hypertension can cause heart attacks, strokes, kidney disease, and other problems. A blood pressure reading consists of a higher number over a lower number. Ideally, your blood pressure should be below 120/80. The first ("top") number is called the systolic pressure. It is a measure of the pressure in your arteries as your heart beats. The second ("bottom") number is called the diastolic pressure. It is a measure of the pressure in your arteries as the heart relaxes. What are the causes? The cause of this condition is not known. What increases the risk? Some risk factors for high blood pressure are under your control. Others are  not. Factors you can change  Smoking.  Having type 2 diabetes mellitus, high cholesterol, or both.  Not getting enough exercise or physical activity.  Being overweight.  Having too much fat, sugar, calories, or salt (sodium) in your diet.  Drinking too much alcohol. Factors that are difficult or impossible to change  Having chronic kidney disease.  Having a family history of high blood pressure.  Age. Risk increases with age.  Race. You may be at higher risk if you are African-American.  Gender. Men are at higher risk than women before age 23. After age 44, women are at higher risk than men.  Having obstructive sleep apnea.  Stress. What are the signs or symptoms? Extremely high blood pressure (hypertensive crisis) may cause:  Headache.  Anxiety.  Shortness of breath.  Nosebleed.  Nausea and vomiting.  Severe chest pain.  Jerky movements you cannot control (seizures). How is this diagnosed? This condition is diagnosed by measuring your blood pressure while you are seated, with your arm resting on a surface. The cuff of the blood pressure monitor will be placed directly against the skin of your upper arm at the level of your heart. It should be measured at least twice using the same arm. Certain conditions can cause a difference in blood pressure between your right and left arms. Certain factors can cause blood pressure readings to be lower or higher than normal (elevated) for a short period of time:  When your blood pressure is higher when you are in a health care provider's office than when you are at home, this is called white coat hypertension. Most people with this condition do not need medicines.  When your blood pressure is higher at home than when you are in a health care provider's office, this is called masked hypertension. Most people with this condition may need medicines to control blood pressure. If you have a high blood pressure reading during one  visit or you have normal blood pressure with other risk factors:  You may be asked to return on a different day to have your blood pressure checked again.  You may be asked to monitor your blood pressure at home for 1 week or longer. If you are diagnosed with hypertension, you may have other blood or imaging tests to help your health care provider understand your overall risk for other conditions. How is this treated? This condition is treated by making healthy lifestyle changes, such as eating healthy foods, exercising more, and reducing your alcohol intake. Your health care provider may prescribe medicine if lifestyle changes are not enough to get your blood pressure under control, and if:  Your systolic blood pressure is above 130.  Your diastolic blood pressure is above 80. Your personal target blood pressure may vary depending on your medical conditions, your age, and other factors. Follow these instructions at home: Eating and drinking   Eat a diet that is high in fiber and potassium, and low in sodium, added sugar, and fat. An example eating plan is called the DASH (Dietary Approaches to Stop Hypertension) diet. To eat this way: ? Eat plenty of fresh fruits and vegetables. Try to fill half of your plate at each meal with fruits and vegetables. ? Eat whole grains, such as whole wheat pasta, brown rice, or whole grain bread. Fill about one quarter of your plate with whole grains. ? Eat or drink low-fat dairy products, such as skim milk or low-fat yogurt. ? Avoid fatty cuts of meat, processed or cured meats, and poultry with skin. Fill about one quarter of your plate with lean proteins, such as fish, chicken without skin, beans, eggs, and tofu. ? Avoid premade and processed foods. These tend to be higher in sodium, added sugar, and fat.  Reduce your daily sodium intake. Most people with hypertension should eat less than 1,500 mg of sodium a day.  Limit alcohol intake to no more than 1  drink a day for nonpregnant women and 2 drinks a day for men. One drink equals 12 oz of beer, 5 oz of wine, or 1 oz of hard liquor. Lifestyle   Work with your health care provider to maintain a healthy body weight or to lose weight. Ask what an ideal weight is for you.  Get at least 30 minutes of exercise that causes your heart to beat faster (aerobic exercise) most days of the week. Activities may include walking, swimming, or biking.  Include exercise to strengthen your muscles (resistance exercise), such as pilates or lifting weights, as part of your weekly exercise routine. Try to do these types of exercises for 30 minutes at least 3 days a week.  Do not use any products that contain nicotine or tobacco, such as cigarettes and e-cigarettes. If you need help quitting, ask your health care provider.  Monitor your blood pressure at home as told by your health care provider.  Keep all follow-up visits as told by your health care provider. This is important. Medicines  Take over-the-counter and prescription medicines only as told by your health care provider. Follow directions carefully. Blood pressure medicines must be taken as prescribed.  Do not skip doses of blood pressure medicine. Doing this puts you at risk for problems and can make the medicine less effective.  Ask your health care provider about side effects or reactions to medicines that you should watch for. Contact a health care provider if:  You think you are having a reaction to a medicine you are taking.  You have headaches that keep coming back (recurring).  You feel dizzy.  You have swelling in your ankles.  You have trouble with your vision. Get help right away if:  You develop a severe headache or confusion.  You have unusual weakness or numbness.  You feel faint.  You have severe pain in your chest or abdomen.  You vomit repeatedly.  You have trouble breathing. Summary  Hypertension is when the  force of blood pumping through your arteries is too strong. If this condition is not controlled, it may put you at risk for serious complications.  Your personal target blood pressure may vary depending on your medical conditions, your age, and other factors. For most people, a normal blood pressure is less than 120/80.  Hypertension is treated with lifestyle changes, medicines, or a combination of both. Lifestyle changes include weight loss, eating a healthy, low-sodium diet, exercising more, and limiting alcohol. This information is not intended to replace advice given to you by your health care provider. Make sure you discuss any questions you have with your health care provider. Document Released: 06/24/2005 Document Revised: 05/22/2016 Document Reviewed: 05/22/2016 Elsevier Interactive Patient Education  2019 [employer].

## 2018-09-09 ENCOUNTER — Other Ambulatory Visit: Payer: Self-pay | Admitting: Family Medicine

## 2018-09-09 ENCOUNTER — Encounter: Payer: Self-pay | Admitting: Family Medicine

## 2018-09-09 LAB — BASIC METABOLIC PANEL WITH GFR
BUN/Creatinine Ratio: 20 (calc) (ref 6–22)
BUN: 22 mg/dL (ref 7–25)
CALCIUM: 10.1 mg/dL (ref 8.6–10.4)
CO2: 28 mmol/L (ref 20–32)
Chloride: 104 mmol/L (ref 98–110)
Creat: 1.12 mg/dL — ABNORMAL HIGH (ref 0.50–1.05)
GFR, EST NON AFRICAN AMERICAN: 54 mL/min/{1.73_m2} — AB (ref >=60)
GFR, Est African American: 63 mL/min/{1.73_m2} (ref >=60)
Glucose, Bld: 44 mg/dL — ABNORMAL LOW (ref 65–99)
Potassium: 4.5 mmol/L (ref 3.5–5.3)
Sodium: 140 mmol/L (ref 135–146)

## 2018-09-09 NOTE — Assessment & Plan Note (Signed)
Well controlled on meds, cymbalta and valium, no SE of meds I discussed with the pt at length and pharmacology, dependency, possible side effects, cause for sedation and confusion and concerns with Valium use daily so at all possible she was encouraged to decrease doses gradually or use more sparingly.

## 2018-09-09 NOTE — Assessment & Plan Note (Signed)
Well controlled with current meds, ok to refill when due

## 2018-09-09 NOTE — Assessment & Plan Note (Signed)
Compliant with meds, no SE, she can afford current regimen BP at goal today 130/80 Recheck BMP with GFR for renal function and electrolyte she has had some worsening renal function been gradual over the past couple months If better or improving will do med refills and 6 month f/up, pt instructed to check BP several times a week and make sure she is drinking ample and urinating expected amount If any worsening of labs patient may want to seek out a sliding scale clinic or [place] to help her get more medical attention that she would require and this way she could afford it

## 2018-09-09 NOTE — Telephone Encounter (Signed)
Ok to refill??  Last office visit 09/08/2018.  Last refill  06/29/2018.

## 2018-09-10 ENCOUNTER — Other Ambulatory Visit: Payer: Self-pay

## 2018-09-10 DIAGNOSIS — G4700 Insomnia, unspecified: Secondary | ICD-10-CM

## 2018-09-10 DIAGNOSIS — I10 Essential (primary) hypertension: Secondary | ICD-10-CM

## 2018-09-10 MED ORDER — LOSARTAN POTASSIUM 100 MG PO TABS: 100.0000 mg | ORAL_TABLET | Freq: Every day | ORAL | 2 refills | Status: DC

## 2018-09-10 MED ORDER — ZOLPIDEM TARTRATE ER 12.5 MG PO TBCR: EXTENDED_RELEASE_TABLET | ORAL | 5 refills | Status: DC

## 2018-09-15 ENCOUNTER — Other Ambulatory Visit: Payer: Self-pay | Admitting: Family Medicine

## 2018-09-15 ENCOUNTER — Other Ambulatory Visit: Payer: Self-pay

## 2018-09-15 DIAGNOSIS — I10 Essential (primary) hypertension: Secondary | ICD-10-CM

## 2018-09-15 DIAGNOSIS — Z79899 Other long term (current) drug therapy: Secondary | ICD-10-CM

## 2018-09-15 DIAGNOSIS — E162 Hypoglycemia, unspecified: Secondary | ICD-10-CM | POA: Insufficient documentation

## 2018-09-15 MED ORDER — ATENOLOL 25 MG PO TABS: 25.0000 mg | ORAL_TABLET | Freq: Every day | ORAL | 1 refills | Status: DC

## 2018-09-15 NOTE — Telephone Encounter (Signed)
Last office visit: 09/08/2018 Last filled: 08/20/2018

## 2018-09-15 NOTE — Telephone Encounter (Signed)
Called patient and informed her to come in for fasting labs. Patient verbalized understanding.

## 2018-09-15 NOTE — Telephone Encounter (Signed)
Can you please call the patient and see if she will come in sooner to recheck her blood sugar.  Its been many years since her blood counts were rechecked and the celebrex can cause a lot GI problems including ulcers, bleeding and we need to check a CBC with her continuing to take it for her back.   With ASA it also increases risk of bleeding.  I'm not sure why she is on ASA either.  No cholesterol done for years either.  So I would like to see if she can come in to do fasting labs to recheck her blood sugar, her CBC, CMP and FLP.     They may have been doing minimal labs for years because of insurance issues?  If she has coverage now we need to address all this to be able to take care of her well.

## 2018-09-21 ENCOUNTER — Telehealth: Payer: Self-pay | Admitting: Family Medicine

## 2018-09-21 NOTE — Telephone Encounter (Signed)
FYI: Patient called stating that she will not be in for her lab work until [employer] virus dies down. She just wanted to give [place] an FYI.

## 2018-09-22 NOTE — Telephone Encounter (Signed)
I understand, future labs are pending, she can do as soon as she feels comfortable

## 2018-09-30 ENCOUNTER — Other Ambulatory Visit: Payer: Self-pay | Admitting: Family Medicine

## 2018-10-01 NOTE — Telephone Encounter (Signed)
Ok to refill??  Last office visit 09/08/2018- per notes continue GAD meds  Last refill 09/09/2018.

## 2018-10-01 NOTE — Telephone Encounter (Signed)
I will reach out to her.  She is taking more than prescribed if she needs refill early, and I'd like to check and her to know why.  I'll handle Thanks

## 2018-10-02 ENCOUNTER — Other Ambulatory Visit: Payer: Self-pay | Admitting: Family Medicine

## 2018-10-02 DIAGNOSIS — F419 Anxiety disorder, unspecified: Secondary | ICD-10-CM

## 2018-10-03 ENCOUNTER — Other Ambulatory Visit: Payer: Self-pay | Admitting: Family Medicine

## 2018-10-03 DIAGNOSIS — F419 Anxiety disorder, unspecified: Secondary | ICD-10-CM

## 2018-10-05 ENCOUNTER — Other Ambulatory Visit: Payer: Self-pay | Admitting: Family Medicine

## 2018-10-05 DIAGNOSIS — F419 Anxiety disorder, unspecified: Secondary | ICD-10-CM

## 2018-10-06 ENCOUNTER — Other Ambulatory Visit: Payer: Self-pay | Admitting: Family Medicine

## 2018-10-06 DIAGNOSIS — F419 Anxiety disorder, unspecified: Secondary | ICD-10-CM

## 2018-10-07 ENCOUNTER — Other Ambulatory Visit: Payer: Self-pay | Admitting: Family Medicine

## 2018-10-07 DIAGNOSIS — F419 Anxiety disorder, unspecified: Secondary | ICD-10-CM

## 2018-11-05 ENCOUNTER — Other Ambulatory Visit: Payer: Self-pay | Admitting: Family Medicine

## 2018-11-05 DIAGNOSIS — F419 Anxiety disorder, unspecified: Secondary | ICD-10-CM

## 2018-11-06 NOTE — Telephone Encounter (Signed)
Requested Prescriptions   Pending Prescriptions Disp Refills  . diazepam (VALIUM) 5 MG tablet [Pharmacy Med Name: DIAZEPAM 5 MG TABLET] 60 tablet 0    Sig: TAKE ONE TABLET BY MOUTH TWICE A DAY AS NEEDED FOR ANXIETY    Last OV 09/08/2018 Last filled 10/07/2018

## 2018-11-08 ENCOUNTER — Other Ambulatory Visit: Payer: Self-pay | Admitting: Family Medicine

## 2018-11-08 DIAGNOSIS — F419 Anxiety disorder, unspecified: Secondary | ICD-10-CM

## 2018-11-09 ENCOUNTER — Telehealth: Payer: Self-pay | Admitting: Family Medicine

## 2018-11-09 DIAGNOSIS — F419 Anxiety disorder, unspecified: Secondary | ICD-10-CM

## 2018-11-09 DIAGNOSIS — G4700 Insomnia, unspecified: Secondary | ICD-10-CM

## 2018-11-09 NOTE — Telephone Encounter (Signed)
Patient calling for refill on her diazepam , [facility] [phone]

## 2018-11-10 MED ORDER — DIAZEPAM 5 MG PO TABS: 5.0000 mg | ORAL_TABLET | Freq: Two times a day (BID) | ORAL | 0 refills | Status: DC | PRN

## 2018-11-10 MED ORDER — ZOLPIDEM TARTRATE ER 12.5 MG PO TBCR: EXTENDED_RELEASE_TABLET | ORAL | 0 refills | Status: DC

## 2018-11-10 NOTE — Telephone Encounter (Signed)
Controlled substances will not e-prescribe Controlled Substance Prescriptions [place] Controlled Substance Registry consulted? yes  Rx printed, signed and left with front office, pt will be notified

## 2018-11-25 ENCOUNTER — Ambulatory Visit: Payer: Self-pay | Admitting: Family Medicine

## 2018-12-18 ENCOUNTER — Other Ambulatory Visit: Payer: Self-pay | Admitting: Family Medicine

## 2018-12-18 DIAGNOSIS — F419 Anxiety disorder, unspecified: Secondary | ICD-10-CM

## 2018-12-18 DIAGNOSIS — F320 Major depressive disorder, single episode, mild: Secondary | ICD-10-CM

## 2018-12-18 NOTE — Telephone Encounter (Signed)
Requested Prescriptions   Pending Prescriptions Disp Refills  . DULoxetine (CYMBALTA) 60 MG capsule [Pharmacy Med Name: DULoxetine HCL DR 60 MG CAPSULE] 30 capsule 0    Sig: TAKE ONE CAPSULE BY MOUTH DAILY  . celecoxib (CELEBREX) 100 MG capsule [Pharmacy Med Name: CELECOXIB 100 MG CAPSULE] 60 capsule 1    Sig: TAKE ONE CAPSULE BY MOUTH TWICE A DAY   Last OV 09/08/2018 Last written   Duloxetine 06/17/2018 Celecoxib   09/15/2018

## 2019-01-04 ENCOUNTER — Other Ambulatory Visit: Payer: Self-pay | Admitting: Family Medicine

## 2019-01-04 DIAGNOSIS — F419 Anxiety disorder, unspecified: Secondary | ICD-10-CM

## 2019-01-04 NOTE — Telephone Encounter (Signed)
Requested Prescriptions   Pending Prescriptions Disp Refills  . diazepam (VALIUM) 5 MG tablet [Pharmacy Med Name: DIAZEPAM 5 MG TABLET] 60 tablet 0    Sig: TAKE ONE TABLET BY MOUTH TWICE A DAY AS NEEDED FOR ANXIETY   Last OV 09/08/2018 Last written 11/10/2018

## 2019-02-03 ENCOUNTER — Other Ambulatory Visit: Payer: Self-pay | Admitting: Family Medicine

## 2019-02-03 DIAGNOSIS — F419 Anxiety disorder, unspecified: Secondary | ICD-10-CM

## 2019-02-04 NOTE — Telephone Encounter (Signed)
Ok to refill??  Last office visit 09/08/2018  Last refill 01/01/2019.

## 2019-02-04 NOTE — Telephone Encounter (Signed)
Sorry its already almost august - yes I'll put the refill through - they may not fill it early though

## 2019-02-04 NOTE — Telephone Encounter (Signed)
Last filled was 7/2 for 30 d supply, she will have to wait until 8/2 or be seen if she needs sooner.

## 2019-02-28 ENCOUNTER — Other Ambulatory Visit: Payer: Self-pay | Admitting: Family Medicine

## 2019-02-28 DIAGNOSIS — G4700 Insomnia, unspecified: Secondary | ICD-10-CM

## 2019-03-01 NOTE — Telephone Encounter (Signed)
Ok to refill??  Last office visit 09/08/2018.  Last refill 11/10/2018.  Of note, letter sent to patient to schedule appointment to establish with a new provider.

## 2019-03-07 ENCOUNTER — Other Ambulatory Visit: Payer: Self-pay | Admitting: Family Medicine

## 2019-03-07 DIAGNOSIS — F419 Anxiety disorder, unspecified: Secondary | ICD-10-CM

## 2019-03-08 NOTE — Telephone Encounter (Signed)
Requesting refill    Valium  LOV: 09/08/18  LRF:  02/04/19

## 2019-03-08 NOTE — Telephone Encounter (Signed)
Patient called in and made an appt and also knew that she had called in this prescription yesterday. She is scheduled to see Dr. Dennard Schaumann on Thursday. CB# [phone]

## 2019-03-10 ENCOUNTER — Other Ambulatory Visit: Payer: Self-pay

## 2019-03-11 ENCOUNTER — Ambulatory Visit (INDEPENDENT_AMBULATORY_CARE_PROVIDER_SITE_OTHER): Payer: Self-pay | Admitting: Family Medicine

## 2019-03-11 VITALS — BP 130/80 | HR 71 | Temp 98.5°F | Resp 18 | Ht 64.0 in | Wt 147.0 lb

## 2019-03-11 DIAGNOSIS — G4700 Insomnia, unspecified: Secondary | ICD-10-CM

## 2019-03-11 DIAGNOSIS — M5136 Other intervertebral disc degeneration, lumbar region: Secondary | ICD-10-CM

## 2019-03-11 DIAGNOSIS — I10 Essential (primary) hypertension: Secondary | ICD-10-CM

## 2019-03-11 DIAGNOSIS — M51369 Other intervertebral disc degeneration, lumbar region without mention of lumbar back pain or lower extremity pain: Secondary | ICD-10-CM

## 2019-03-11 DIAGNOSIS — F419 Anxiety disorder, unspecified: Secondary | ICD-10-CM

## 2019-03-11 LAB — COMPLETE METABOLIC PANEL WITH GFR
AG Ratio: 2 (calc) (ref 1.0–2.5)
ALT: 25 U/L (ref 6–29)
AST: 27 U/L (ref 10–35)
Albumin: 4.7 g/dL (ref 3.6–5.1)
Alkaline phosphatase (APISO): 112 U/L (ref 37–153)
BUN/Creatinine Ratio: 14 (calc) (ref 6–22)
BUN: 20 mg/dL (ref 7–25)
CO2: 24 mmol/L (ref 20–32)
Calcium: 10.3 mg/dL (ref 8.6–10.4)
Chloride: 102 mmol/L (ref 98–110)
Creat: 1.47 mg/dL — ABNORMAL HIGH (ref 0.50–1.05)
GFR, Est African American: 45 mL/min/{1.73_m2} — ABNORMAL LOW (ref >=60)
GFR, Est Non African American: 39 mL/min/{1.73_m2} — ABNORMAL LOW (ref >=60)
Globulin: 2.4 g/dL (calc) (ref 1.9–3.7)
Glucose, Bld: 116 mg/dL — ABNORMAL HIGH (ref 65–99)
Potassium: 4.5 mmol/L (ref 3.5–5.3)
Sodium: 136 mmol/L (ref 135–146)
Total Bilirubin: 0.5 mg/dL (ref 0.2–1.2)
Total Protein: 7.1 g/dL (ref 6.1–8.1)

## 2019-03-11 LAB — LIPID PANEL
Cholesterol: 259 mg/dL — ABNORMAL HIGH (ref <200)
HDL: 73 mg/dL (ref >=50)
LDL Cholesterol (Calc): 161 mg/dL (calc) — ABNORMAL HIGH
Non-HDL Cholesterol (Calc): 186 mg/dL (calc) — ABNORMAL HIGH (ref <130)
Total CHOL/HDL Ratio: 3.5 (calc) (ref <5.0)
Triglycerides: 124 mg/dL (ref <150)

## 2019-03-11 MED ORDER — CYCLOBENZAPRINE HCL 10 MG PO TABS: 10.0000 mg | ORAL_TABLET | Freq: Three times a day (TID) | ORAL | 0 refills | Status: DC | PRN

## 2019-03-11 NOTE — Progress Notes (Signed)
Subjective:    Patient ID: Erika Harvey, female    DOB: 1960/08/29, 58 y.o.   MRN: [id number]  HPI  Patient used to see my partner who has since left the practice.  She presents today for a follow-up on her medications.  She has a history of essential hypertension which is currently being managed with a combination of atenolol as well as losartan.  She denies any chest pain shortness of breath or dyspnea on exertion.  She also has a history of degenerative disc disease in the lumbar spine as well as having a history of a herniated disc in her lumbar spine requiring surgery.  The pain in her back is improved dramatically however she continues to have daily muscular pain in the lumbar paraspinal muscles.  She gets frequent muscle spasms and often has to simply sit down or lie down to get the spasms to release.  She has not tried a muscle relaxer.  She has tried taking Celebrex twice a day on a daily basis.  She also has insomnia and states that Ambien CR is no longer working.  She has been on that for a long time.  She also has a history of anxiety for which she takes Cymbalta coupled with Valium on an as-needed basis.  She takes 60 mg of Cymbalta every day.  She takes Valium once a day to every other day depending on how her anxiety is.  She denies any depression or suicidal ideation.  However she does not feel that the Ambien helps anymore.  She thinks that she may have become tolerant to it.  Her mammogram is up-to-date along with her Pap smear which was performed at her gynecologist office.  Her colonoscopy is also up-to-date Past Medical History:  Diagnosis Date  . Allergy   . Anxiety   . Chronic low back pain   . Fibroid 03/30/2015  . Frequent UTI   . Hypertension   . Vaginal bleeding 03/30/2015   Past Surgical History:  Procedure Laterality Date  . BACK SURGERY  05-16-2015  . KNEE SURGERY     left  . KNEE SURGERY Left 07/08/2012  . WISDOM TOOTH EXTRACTION     Current Outpatient  Medications on File Prior to Visit  Medication Sig Dispense Refill  . atenolol (TENORMIN) 25 MG tablet Take 1 tablet (25 mg total) by mouth daily. 90 tablet 1  . calcium-vitamin D (OSCAL WITH D) 250-125 MG-UNIT per tablet Take 1 tablet by mouth daily.      . celecoxib (CELEBREX) 100 MG capsule TAKE ONE CAPSULE BY MOUTH TWICE A DAY 180 capsule 3  . diazepam (VALIUM) 5 MG tablet TAKE ONE TABLET BY MOUTH TWICE A DAY AS NEEDED FOR ANXIETY 60 tablet 0  . DULoxetine (CYMBALTA) 60 MG capsule TAKE ONE CAPSULE BY MOUTH DAILY 90 capsule 3  . fish oil-omega-3 fatty acids 1000 MG capsule Take 2 g by mouth 2 (two) times daily.      Marland Kitchen losartan (COZAAR) 100 MG tablet Take 1 tablet (100 mg total) by mouth daily. 90 tablet 2  . omeprazole (PRILOSEC) 20 MG capsule Take 1 capsule (20 mg total) by mouth daily. 90 capsule 3  . zolpidem (AMBIEN CR) 12.5 MG CR tablet TAKE ONE TABLET BY MOUTH AT BEDTIME AS NEEDED FOR SLEEP 30 tablet 1   No current facility-administered medications on file prior to visit.    Allergies  Allergen Reactions  . Norvasc [Amlodipine Besylate] Swelling and Rash   Social  History   Socioeconomic History  . Marital status: Married    Spouse name: Not on file  . Number of children: Not on file  . Years of education: Not on file  . Highest education level: Not on file  Occupational History  . Not on file  Social Needs  . Financial resource strain: Not on file  . Food insecurity    Worry: Not on file    Inability: Not on file  . Transportation needs    Medical: Not on file    Non-medical: Not on file  Tobacco Use  . Smoking status: Never Smoker  . Smokeless tobacco: Never Used  Substance and Sexual Activity  . Alcohol use: Yes    Alcohol/week: 5.0 standard drinks    Types: 5 Cans of beer per week  . Drug use: No  . Sexual activity: Yes    Birth control/protection: Pill  Lifestyle  . Physical activity    Days per week: Not on file    Minutes per session: Not on file  .  Stress: Not on file  Relationships  . Social Herbalist on phone: Not on file    Gets together: Not on file    Attends religious service: Not on file    Active member of club or organization: Not on file    Attends meetings of clubs or organizations: Not on file    Relationship status: Not on file  . Intimate partner violence    Fear of current or ex partner: Not on file    Emotionally abused: Not on file    Physically abused: Not on file    Forced sexual activity: Not on file  Other Topics Concern  . Not on file  Social History Narrative  . Not on file     Review of Systems  All other systems reviewed and are negative.      Objective:   Physical Exam Vitals signs reviewed.  Constitutional:      Appearance: Normal appearance.  Cardiovascular:     Rate and Rhythm: Normal rate and regular rhythm.     Heart sounds: Normal heart sounds.  Pulmonary:     Effort: Pulmonary effort is normal. No respiratory distress.     Breath sounds: Normal breath sounds. No stridor. No wheezing, rhonchi or rales.  Chest:     Chest wall: No tenderness.  Abdominal:     General: Abdomen is flat. Bowel sounds are normal.     Palpations: Abdomen is soft.  Musculoskeletal:     Lumbar back: She exhibits decreased range of motion, pain and spasm. She exhibits no tenderness and no bony tenderness.       Back:  Neurological:     Mental Status: She is alert.           Assessment & Plan:  Benign essential HTN - Plan: COMPLETE METABOLIC PANEL WITH GFR, Lipid panel  Insomnia, unspecified type  Anxiety  DDD (degenerative disc disease), lumbar  Patient's blood pressures well controlled today.  She has mild chronic kidney disease and therefore I will check a CMP to monitor her renal function.  I will also check a fasting lipid panel.  Her goal LDL cholesterol is less than 130.  Patient has insomnia.  She has become habituated to Ambien CR.  Rather than increase the strength, I have  recommended that she try Flexeril 10 mg at night to see if this will help her sleep and also help with  muscle spasms.  She could also take an additional 5 mg of Flexeril during the daytime as needed for muscle spasms.  I have recommended that she also try yoga and stretching to help increase the flexibility and decrease the frequency of the pain and stiffness in her lumbar paraspinal muscles.  Regarding her anxiety, the Cymbalta seems to be working well.  She is using Valium infrequently.  I will make no changes in this.  She declined her flu shot today

## 2019-03-12 ENCOUNTER — Other Ambulatory Visit: Payer: Self-pay

## 2019-03-12 MED ORDER — ATORVASTATIN CALCIUM 40 MG PO TABS: 40.0000 mg | ORAL_TABLET | Freq: Every day | ORAL | 1 refills | Status: DC

## 2019-03-15 ENCOUNTER — Other Ambulatory Visit: Payer: Self-pay | Admitting: Family Medicine

## 2019-03-30 ENCOUNTER — Other Ambulatory Visit: Payer: Self-pay | Admitting: Family Medicine

## 2019-03-30 NOTE — Telephone Encounter (Signed)
Ok to refill 

## 2019-04-06 ENCOUNTER — Other Ambulatory Visit: Payer: Self-pay | Admitting: Family Medicine

## 2019-04-06 DIAGNOSIS — F419 Anxiety disorder, unspecified: Secondary | ICD-10-CM

## 2019-04-06 NOTE — Telephone Encounter (Signed)
Ok to refill??  Last office visit 03/11/2019  Last refill 03/08/2019.

## 2019-04-16 ENCOUNTER — Other Ambulatory Visit: Payer: Self-pay | Admitting: Family Medicine

## 2019-04-16 NOTE — Telephone Encounter (Signed)
Ok to refill 

## 2019-04-19 ENCOUNTER — Telehealth: Payer: Self-pay | Admitting: Family Medicine

## 2019-04-19 NOTE — Telephone Encounter (Signed)
Patient needing refill on flexeril [facility]

## 2019-04-27 ENCOUNTER — Other Ambulatory Visit: Payer: Self-pay | Admitting: Family Medicine

## 2019-04-27 DIAGNOSIS — G4700 Insomnia, unspecified: Secondary | ICD-10-CM

## 2019-04-28 NOTE — Telephone Encounter (Signed)
Requested Prescriptions   Pending Prescriptions Disp Refills  . zolpidem (AMBIEN CR) 12.5 MG CR tablet [Pharmacy Med Name: ZOLPIDEM TART ER 12.5 MG TAB] 30 tablet 0    Sig: TAKE ONE TABLET BY MOUTH EVERY NIGHT AT BEDTIME AS NEEDED FOR SLEEP    Last OV 03/11/2019  Last written 03/01/2019

## 2019-05-04 ENCOUNTER — Other Ambulatory Visit: Payer: Self-pay | Admitting: Family Medicine

## 2019-05-04 DIAGNOSIS — F419 Anxiety disorder, unspecified: Secondary | ICD-10-CM

## 2019-05-05 NOTE — Telephone Encounter (Signed)
Requesting refill   Valium  LOV: 03/11/2019  LRF:  04/06/19

## 2019-05-09 ENCOUNTER — Other Ambulatory Visit: Payer: Self-pay | Admitting: Family Medicine

## 2019-05-10 NOTE — Telephone Encounter (Signed)
Requested Prescriptions   Pending Prescriptions Disp Refills  . cyclobenzaprine (FLEXERIL) 10 MG tablet [Pharmacy Med Name: CYCLOBENZAPRINE 10 MG TABLET] 60 tablet 0    Sig: TAKE ONE TABLET BY MOUTH THREE TIMES A DAY AS NEEDED FOR MUSCLE SPASMS    Last OV 03/11/2019  Last written 04/19/2019

## 2019-05-21 ENCOUNTER — Telehealth: Payer: Self-pay | Admitting: Family Medicine

## 2019-05-21 NOTE — Telephone Encounter (Signed)
Pt called and wanted to know if you would prescribe her Tramadol for her arthritis pain. She states that she thinks she would only need 2 a day if you would rx that way. She states that she is in a lot of pain and the Celebrex is not helping.

## 2019-05-24 ENCOUNTER — Other Ambulatory Visit: Payer: Self-pay | Admitting: Family Medicine

## 2019-05-24 DIAGNOSIS — G4700 Insomnia, unspecified: Secondary | ICD-10-CM

## 2019-05-24 NOTE — Telephone Encounter (Signed)
Pt aware and apt made 

## 2019-05-24 NOTE — Telephone Encounter (Signed)
Ok to refill??  Last office visit 03/11/2019.  Last refill 04/29/2019.

## 2019-05-24 NOTE — Telephone Encounter (Signed)
Would need ov prior to starting controlled substances but I woul dbe willing.

## 2019-05-25 ENCOUNTER — Ambulatory Visit: Payer: Self-pay | Admitting: Family Medicine

## 2019-05-25 ENCOUNTER — Other Ambulatory Visit: Payer: Self-pay | Admitting: Family Medicine

## 2019-05-25 NOTE — Telephone Encounter (Signed)
Ok to refill 

## 2019-06-04 ENCOUNTER — Other Ambulatory Visit: Payer: Self-pay | Admitting: Family Medicine

## 2019-06-04 DIAGNOSIS — F419 Anxiety disorder, unspecified: Secondary | ICD-10-CM

## 2019-06-07 ENCOUNTER — Other Ambulatory Visit: Payer: Self-pay | Admitting: Family Medicine

## 2019-06-07 DIAGNOSIS — F419 Anxiety disorder, unspecified: Secondary | ICD-10-CM

## 2019-06-07 NOTE — Telephone Encounter (Signed)
Ok to refill??  Last office visit 03/11/2019.  Last refill 05/06/2019.

## 2019-06-09 ENCOUNTER — Other Ambulatory Visit: Payer: Self-pay | Admitting: Family Medicine

## 2019-06-09 NOTE — Telephone Encounter (Signed)
Ok to refill 

## 2019-06-13 ENCOUNTER — Other Ambulatory Visit: Payer: Self-pay | Admitting: Family Medicine

## 2019-06-24 ENCOUNTER — Other Ambulatory Visit: Payer: Self-pay | Admitting: Family Medicine

## 2019-06-24 DIAGNOSIS — G4700 Insomnia, unspecified: Secondary | ICD-10-CM

## 2019-06-24 NOTE — Telephone Encounter (Signed)
Ok to refill??  Last office visit 09/103/2020.  Last refill 05/25/2019.  Ok to add refills to prescription?

## 2019-07-02 ENCOUNTER — Other Ambulatory Visit: Payer: Self-pay | Admitting: Family Medicine

## 2019-07-02 DIAGNOSIS — I10 Essential (primary) hypertension: Secondary | ICD-10-CM

## 2019-07-06 ENCOUNTER — Other Ambulatory Visit: Payer: Self-pay | Admitting: Family Medicine

## 2019-07-06 DIAGNOSIS — F419 Anxiety disorder, unspecified: Secondary | ICD-10-CM

## 2019-07-06 NOTE — Telephone Encounter (Signed)
Last office visit: 03/11/2019 Last refilled: 06/07/2019 diazepam                      06/10/2019 Flexeril

## 2019-07-08 ENCOUNTER — Encounter: Payer: Self-pay | Admitting: Family Medicine

## 2019-07-08 ENCOUNTER — Other Ambulatory Visit: Payer: Self-pay

## 2019-07-08 ENCOUNTER — Ambulatory Visit (INDEPENDENT_AMBULATORY_CARE_PROVIDER_SITE_OTHER): Payer: Self-pay | Admitting: Family Medicine

## 2019-07-08 VITALS — BP 126/80 | HR 78 | Temp 98.7°F | Resp 14 | Ht 64.0 in | Wt 163.0 lb

## 2019-07-08 DIAGNOSIS — N289 Disorder of kidney and ureter, unspecified: Secondary | ICD-10-CM

## 2019-07-08 MED ORDER — DICLOFENAC SODIUM 1 % EX GEL: 2.0000 g | Freq: Four times a day (QID) | CUTANEOUS | 5 refills | Status: DC

## 2019-07-08 MED ORDER — TRAMADOL HCL 50 MG PO TABS: 50.0000 mg | ORAL_TABLET | Freq: Three times a day (TID) | ORAL | 0 refills | Status: DC | PRN

## 2019-07-08 NOTE — Progress Notes (Signed)
Subjective:    Patient ID: Erika Harvey, female    DOB: [birth year], 58 y.o.   MRN: [id number]  HPI 03/11/19 Patient used to see my partner who has since left the practice.  She presents today for a follow-up on her medications.  She has a history of essential hypertension which is currently being managed with a combination of atenolol as well as losartan.  She denies any chest pain shortness of breath or dyspnea on exertion.  She also has a history of degenerative disc disease in the lumbar spine as well as having a history of a herniated disc in her lumbar spine requiring surgery.  The pain in her back is improved dramatically however she continues to have daily muscular pain in the lumbar paraspinal muscles.  She gets frequent muscle spasms and often has to simply sit down or lie down to get the spasms to release.  She has not tried a muscle relaxer.  She has tried taking Celebrex twice a day on a daily basis.  She also has insomnia and states that Ambien CR is no longer working.  She has been on that for a long time.  She also has a history of anxiety for which she takes Cymbalta coupled with Valium on an as-needed basis.  She takes 60 mg of Cymbalta every day.  She takes Valium once a day to every other day depending on how her anxiety is.  She denies any depression or suicidal ideation.  However she does not feel that the Ambien helps anymore.  She thinks that she may have become tolerant to it.  Her mammogram is up-to-date along with her Pap smear which was performed at her gynecologist office.  Her colonoscopy is also up-to-date.  At that time, my plan was: Patient's blood pressures well controlled today.  She has mild chronic kidney disease and therefore I will check a CMP to monitor her renal function.  I will also check a fasting lipid panel.  Her goal LDL cholesterol is less than 130.  Patient has insomnia.  She has become habituated to Ambien CR.  Rather than increase the strength, I have  recommended that she try Flexeril 10 mg at night to see if this will help her sleep and also help with muscle spasms.  She could also take an additional 5 mg of Flexeril during the daytime as needed for muscle spasms.  I have recommended that she also try yoga and stretching to help increase the flexibility and decrease the frequency of the pain and stiffness in her lumbar paraspinal muscles.  Regarding her anxiety, the Cymbalta seems to be working well.  She is using Valium infrequently.  I will make no changes in this.  She declined her flu shot today  07/08/19 Patient was initially seen in September.  At that time a BMP revealed a creatinine of 1.47.  I asked the patient to discontinue Celebrex which she was taken on a daily basis for bilateral hand pain as well as bilateral knee pain.  She is here today to recheck her BMP.  She denies any dysuria or hematuria or urgency or frequency.  She denies any oliguria.  Of note she reports unrelenting pain.  She states that all the joints on her hands hurt every day.  She also complains of bilateral knee pain.  In the past she saw orthopedics and they tried numerous cortisone injections without any relief.  She states that she was diagnosed with osteoarthritis and the  Celebrex was helping to manage this.  She is also here today requesting something for pain that she can use for her knee pain in her hand pain if she is unable to take NSAIDs. Past Medical History:  Diagnosis Date  . Allergy   . Anxiety   . Chronic low back pain   . Fibroid 03/30/2015  . Frequent UTI   . Hypertension   . Vaginal bleeding 03/30/2015   Past Surgical History:  Procedure Laterality Date  . BACK SURGERY  05-16-2015  . KNEE SURGERY     left  . KNEE SURGERY Left 07/08/2012  . WISDOM TOOTH EXTRACTION     Current Outpatient Medications on File Prior to Visit  Medication Sig Dispense Refill  . atenolol (TENORMIN) 25 MG tablet TAKE ONE TABLET BY MOUTH DAILY 90 tablet 2  .  atorvastatin (LIPITOR) 40 MG tablet Take 1 tablet (40 mg total) by mouth daily. 90 tablet 1  . calcium-vitamin D (OSCAL WITH D) 250-125 MG-UNIT per tablet Take 1 tablet by mouth daily.      . celecoxib (CELEBREX) 100 MG capsule TAKE ONE CAPSULE BY MOUTH TWICE A DAY 180 capsule 3  . cyclobenzaprine (FLEXERIL) 10 MG tablet TAKE ONE TABLET BY MOUTH THREE TIMES A DAY AS NEEDED FOR MUSCLE SPASMS 60 tablet 0  . diazepam (VALIUM) 5 MG tablet TAKE ONE TABLET BY MOUTH TWICE A DAY AS NEEDED FOR ANXIETY 60 tablet 0  . DULoxetine (CYMBALTA) 60 MG capsule TAKE ONE CAPSULE BY MOUTH DAILY 90 capsule 3  . fish oil-omega-3 fatty acids 1000 MG capsule Take 2 g by mouth 2 (two) times daily.      Marland Kitchen losartan (COZAAR) 100 MG tablet Take 1 tablet (100 mg total) by mouth daily. 90 tablet 2  . omeprazole (PRILOSEC) 20 MG capsule Take 1 capsule (20 mg total) by mouth daily. 90 capsule 3  . zolpidem (AMBIEN CR) 12.5 MG CR tablet TAKE ONE TABLET BY MOUTH EVERY NIGHT AT BEDTIME AS NEEDED FOR SLEEP 30 tablet 3   No current facility-administered medications on file prior to visit.   Allergies  Allergen Reactions  . Norvasc [Amlodipine Besylate] Swelling and Rash   Social History   Socioeconomic History  . Marital status: Married    Spouse name: Not on file  . Number of children: Not on file  . Years of education: Not on file  . Highest education level: Not on file  Occupational History  . Not on file  Tobacco Use  . Smoking status: Never Smoker  . Smokeless tobacco: Never Used  Substance and Sexual Activity  . Alcohol use: Yes    Alcohol/week: 5.0 standard drinks    Types: 5 Cans of beer per week  . Drug use: No  . Sexual activity: Yes    Birth control/protection: Pill  Other Topics Concern  . Not on file  Social History Narrative  . Not on file   Social Determinants of Health   Financial Resource Strain:   . Difficulty of Paying Living Expenses: Not on file  Food Insecurity:   . Worried About  Charity fundraiser in the Last Year: Not on file  . Ran Out of Food in the Last Year: Not on file  Transportation Needs:   . Lack of Transportation (Medical): Not on file  . Lack of Transportation (Non-Medical): Not on file  Physical Activity:   . Days of Exercise per Week: Not on file  . Minutes of Exercise per Session:  Not on file  Stress:   . Feeling of Stress : Not on file  Social Connections:   . Frequency of Communication with Friends and Family: Not on file  . Frequency of Social Gatherings with Friends and Family: Not on file  . Attends Religious Services: Not on file  . Active Member of Clubs or Organizations: Not on file  . Attends Archivist Meetings: Not on file  . Marital Status: Not on file  Intimate Partner Violence:   . Fear of Current or Ex-Partner: Not on file  . Emotionally Abused: Not on file  . Physically Abused: Not on file  . Sexually Abused: Not on file     Review of Systems  All other systems reviewed and are negative.      Objective:   Physical Exam Vitals reviewed.  Constitutional:      Appearance: Normal appearance.  Cardiovascular:     Rate and Rhythm: Normal rate and regular rhythm.     Heart sounds: Normal heart sounds.  Pulmonary:     Effort: Pulmonary effort is normal. No respiratory distress.     Breath sounds: Normal breath sounds. No stridor. No wheezing, rhonchi or rales.  Chest:     Chest wall: No tenderness.  Abdominal:     General: Abdomen is flat. Bowel sounds are normal.     Palpations: Abdomen is soft.  Musculoskeletal:     Lumbar back: Spasms present. No tenderness or bony tenderness. Decreased range of motion.       Back:     Right knee: Bony tenderness present. No LCL laxity, MCL laxity, ACL laxity or PCL laxity.     Left knee: Bony tenderness present. No LCL laxity, MCL laxity, ACL laxity or PCL laxity. Neurological:     Mental Status: She is alert.           Assessment & Plan:  Acute renal  insufficiency - Plan: traMADol (ULTRAM) 50 MG tablet, BASIC METABOLIC PANEL WITH GFR  I will repeat the patient's BMP today.  If creatinine has improved, the patient will simply need to avoid NSAIDs.  If creatinine is worse, I would recommend a renal ultrasound as well as urinalysis and further work-up.  Regarding her bilateral knee and hand pain and low back pain, the patient can use Voltaren gel 2 g applied 3-4 times a day as needed to both hands for hand pain.  She can use Tylenol as needed for low back pain and knee pain.  For occasional rare breakthrough bilateral knee pain she can use tramadol 50 mg.  I gave her 30 tablets and instructed her that I had like this to last approximately 2 months.  If the knee pain is worse, she can return at any time for cortisone injections in her knee.

## 2019-07-09 LAB — BASIC METABOLIC PANEL WITH GFR
BUN: 23 mg/dL (ref 7–25)
CO2: 29 mmol/L (ref 20–32)
Calcium: 9.5 mg/dL (ref 8.6–10.4)
Chloride: 102 mmol/L (ref 98–110)
Creat: 1 mg/dL (ref 0.50–1.05)
GFR, Est African American: 71 mL/min/{1.73_m2} (ref >=60)
GFR, Est Non African American: 62 mL/min/{1.73_m2} (ref >=60)
Glucose, Bld: 104 mg/dL — ABNORMAL HIGH (ref 65–99)
Potassium: 4.5 mmol/L (ref 3.5–5.3)
Sodium: 139 mmol/L (ref 135–146)

## 2019-07-24 ENCOUNTER — Other Ambulatory Visit: Payer: Self-pay | Admitting: Family Medicine

## 2019-07-24 DIAGNOSIS — I10 Essential (primary) hypertension: Secondary | ICD-10-CM

## 2019-07-26 ENCOUNTER — Telehealth: Payer: Self-pay | Admitting: Family Medicine

## 2019-07-26 ENCOUNTER — Other Ambulatory Visit: Payer: Self-pay | Admitting: Family Medicine

## 2019-07-26 DIAGNOSIS — I10 Essential (primary) hypertension: Secondary | ICD-10-CM

## 2019-07-26 MED ORDER — LOSARTAN POTASSIUM 100 MG PO TABS: 100.0000 mg | ORAL_TABLET | Freq: Every day | ORAL | 2 refills | Status: DC

## 2019-07-26 NOTE — Telephone Encounter (Signed)
Medication called/sent to requested pharmacy  

## 2019-07-26 NOTE — Telephone Encounter (Signed)
Patient last seen by Dr.Pickard

## 2019-07-26 NOTE — Telephone Encounter (Signed)
Patient needs refill on losartan  [facility]

## 2019-07-28 ENCOUNTER — Other Ambulatory Visit: Payer: Self-pay | Admitting: Family Medicine

## 2019-07-28 DIAGNOSIS — N289 Disorder of kidney and ureter, unspecified: Secondary | ICD-10-CM

## 2019-07-28 NOTE — Telephone Encounter (Signed)
Ok to refill Flexeril?  Ok to refill Tramadol? Medication is no longer on current list.

## 2019-08-03 ENCOUNTER — Other Ambulatory Visit: Payer: Self-pay | Admitting: Family Medicine

## 2019-08-03 DIAGNOSIS — F419 Anxiety disorder, unspecified: Secondary | ICD-10-CM

## 2019-08-03 NOTE — Telephone Encounter (Signed)
Ok to refill??  Last office visit 07/08/2019.  Last refill 07/06/2019.  Ok to add refills to prescription?

## 2019-08-09 ENCOUNTER — Other Ambulatory Visit: Payer: Self-pay | Admitting: Family Medicine

## 2019-08-09 DIAGNOSIS — N289 Disorder of kidney and ureter, unspecified: Secondary | ICD-10-CM

## 2019-08-09 NOTE — Telephone Encounter (Signed)
Ok to refill??  Last office visit 07/08/2019.  Last refill 07/29/2019.  Ok to add refills to prescription?

## 2019-08-15 ENCOUNTER — Other Ambulatory Visit: Payer: Self-pay | Admitting: Family Medicine

## 2019-08-16 NOTE — Telephone Encounter (Signed)
Ok to refill 

## 2019-09-03 ENCOUNTER — Other Ambulatory Visit: Payer: Self-pay | Admitting: Family Medicine

## 2019-09-06 NOTE — Telephone Encounter (Signed)
Ok to refill 

## 2019-09-09 ENCOUNTER — Other Ambulatory Visit: Payer: Self-pay | Admitting: Family Medicine

## 2019-09-20 ENCOUNTER — Other Ambulatory Visit: Payer: Self-pay | Admitting: Family Medicine

## 2019-09-20 DIAGNOSIS — N289 Disorder of kidney and ureter, unspecified: Secondary | ICD-10-CM

## 2019-09-20 NOTE — Telephone Encounter (Signed)
Ok to refill??  Last office visit 07/08/2019.  Last refill 08/10/2019.

## 2019-09-25 ENCOUNTER — Other Ambulatory Visit: Payer: Self-pay | Admitting: Family Medicine

## 2019-09-27 ENCOUNTER — Other Ambulatory Visit: Payer: Self-pay | Admitting: Family Medicine

## 2019-09-28 NOTE — Telephone Encounter (Signed)
Ok to refill 

## 2019-10-04 ENCOUNTER — Ambulatory Visit: Payer: BLUE CROSS/BLUE SHIELD | Attending: Internal Medicine

## 2019-10-04 DIAGNOSIS — Z23 Encounter for immunization: Secondary | ICD-10-CM

## 2019-10-04 NOTE — Progress Notes (Signed)
   Covid-19 Vaccination Clinic  Name:  Erika Harvey    MRN: [id number] DOB: 1960-08-15  10/04/2019  [name] was observed post Covid-19 immunization for 15 minutes without incident. She was provided with Vaccine Information Sheet and instruction to access the V-Safe system.   [name] was instructed to call 911 with any severe reactions post vaccine: Marland Kitchen Difficulty breathing  . Swelling of face and throat  . A fast heartbeat  . A bad rash all over body  . Dizziness and weakness   Immunizations Administered    Name Date Dose VIS Date Route   Pfizer COVID-19 Vaccine 10/04/2019  8:39 AM 0.3 mL 06/18/2019 Intramuscular   Manufacturer: [place]   Lot: [id number]   [place]: [id number]

## 2019-10-15 ENCOUNTER — Other Ambulatory Visit: Payer: Self-pay | Admitting: Family Medicine

## 2019-10-19 ENCOUNTER — Other Ambulatory Visit: Payer: Self-pay | Admitting: Family Medicine

## 2019-10-19 DIAGNOSIS — G4700 Insomnia, unspecified: Secondary | ICD-10-CM

## 2019-10-19 DIAGNOSIS — N289 Disorder of kidney and ureter, unspecified: Secondary | ICD-10-CM

## 2019-10-19 NOTE — Telephone Encounter (Signed)
Ok to refill??  Last office visit 07/08/2019.  Last refill 06/25/2019, #3 refills.

## 2019-10-19 NOTE — Telephone Encounter (Signed)
Ok to refill??  Last office visit 07/08/2019.  Last refill 09/20/2019.

## 2019-10-26 ENCOUNTER — Ambulatory Visit: Payer: BLUE CROSS/BLUE SHIELD | Attending: Internal Medicine

## 2019-10-26 DIAGNOSIS — Z23 Encounter for immunization: Secondary | ICD-10-CM

## 2019-10-26 NOTE — Progress Notes (Signed)
   Covid-19 Vaccination Clinic  Name:  Erika Harvey    MRN: [id number] DOB: 1961/02/27  10/26/2019  [name] was observed post Covid-19 immunization for 15 minutes without incident. She was provided with Vaccine Information Sheet and instruction to access the V-Safe system.   [name] was instructed to call 911 with any severe reactions post vaccine: Marland Kitchen Difficulty breathing  . Swelling of face and throat  . A fast heartbeat  . A bad rash all over body  . Dizziness and weakness   Immunizations Administered    Name Date Dose VIS Date Route   Pfizer COVID-19 Vaccine 10/26/2019 12:25 PM 0.3 mL 09/01/2018 Intramuscular   Manufacturer: [place]   Lot: [id number]   [place]: [id number]

## 2019-11-05 ENCOUNTER — Other Ambulatory Visit: Payer: Self-pay | Admitting: Family Medicine

## 2019-11-05 MED ORDER — OMEPRAZOLE 20 MG PO CPDR: 20.0000 mg | DELAYED_RELEASE_CAPSULE | Freq: Every day | ORAL | 3 refills | Status: DC

## 2019-11-10 ENCOUNTER — Other Ambulatory Visit: Payer: Self-pay | Admitting: Family Medicine

## 2019-11-10 DIAGNOSIS — N289 Disorder of kidney and ureter, unspecified: Secondary | ICD-10-CM

## 2019-11-11 NOTE — Telephone Encounter (Signed)
Ok to refill??  Last office visit 07/08/2019.  Last refill 10/19/2019

## 2019-11-20 ENCOUNTER — Other Ambulatory Visit: Payer: Self-pay | Admitting: Family Medicine

## 2019-11-20 DIAGNOSIS — N289 Disorder of kidney and ureter, unspecified: Secondary | ICD-10-CM

## 2019-11-22 NOTE — Telephone Encounter (Signed)
Pt requesting refill on Tramadol      LOV:  07/08/2019  LRF:   11/11/2019

## 2019-11-28 ENCOUNTER — Other Ambulatory Visit: Payer: Self-pay | Admitting: Family Medicine

## 2019-11-28 DIAGNOSIS — F419 Anxiety disorder, unspecified: Secondary | ICD-10-CM

## 2019-11-29 ENCOUNTER — Other Ambulatory Visit: Payer: Self-pay | Admitting: Family Medicine

## 2019-11-29 DIAGNOSIS — F419 Anxiety disorder, unspecified: Secondary | ICD-10-CM

## 2019-11-29 NOTE — Telephone Encounter (Signed)
Ok to refill??  Last office visit 07/08/2019.  Last refill 08/03/2019, #3 refills.

## 2019-12-03 ENCOUNTER — Other Ambulatory Visit: Payer: Self-pay | Admitting: Family Medicine

## 2019-12-03 DIAGNOSIS — N289 Disorder of kidney and ureter, unspecified: Secondary | ICD-10-CM

## 2019-12-03 NOTE — Telephone Encounter (Signed)
Last refilled: 11/22/2019 Last office visit: 07/08/2019

## 2019-12-05 ENCOUNTER — Other Ambulatory Visit: Payer: Self-pay | Admitting: Family Medicine

## 2019-12-10 ENCOUNTER — Other Ambulatory Visit: Payer: Self-pay | Admitting: Family Medicine

## 2019-12-10 DIAGNOSIS — F419 Anxiety disorder, unspecified: Secondary | ICD-10-CM

## 2019-12-10 DIAGNOSIS — F320 Major depressive disorder, single episode, mild: Secondary | ICD-10-CM

## 2019-12-11 ENCOUNTER — Other Ambulatory Visit: Payer: Self-pay | Admitting: Family Medicine

## 2019-12-11 DIAGNOSIS — F320 Major depressive disorder, single episode, mild: Secondary | ICD-10-CM

## 2019-12-11 DIAGNOSIS — F419 Anxiety disorder, unspecified: Secondary | ICD-10-CM

## 2019-12-12 ENCOUNTER — Other Ambulatory Visit: Payer: Self-pay | Admitting: Family Medicine

## 2019-12-12 DIAGNOSIS — N289 Disorder of kidney and ureter, unspecified: Secondary | ICD-10-CM

## 2019-12-13 NOTE — Telephone Encounter (Signed)
Ok to refill??  Last office visit 07/26/2019.  Last refill 12/03/2019.

## 2019-12-14 ENCOUNTER — Other Ambulatory Visit: Payer: Self-pay | Admitting: Family Medicine

## 2019-12-14 DIAGNOSIS — F419 Anxiety disorder, unspecified: Secondary | ICD-10-CM

## 2019-12-14 DIAGNOSIS — N289 Disorder of kidney and ureter, unspecified: Secondary | ICD-10-CM

## 2019-12-14 DIAGNOSIS — F320 Major depressive disorder, single episode, mild: Secondary | ICD-10-CM

## 2019-12-14 NOTE — Telephone Encounter (Signed)
Not a patient here. Last Note Juluis Pitch

## 2019-12-14 NOTE — Telephone Encounter (Signed)
CB# [phone] refill Cymabalta

## 2019-12-14 NOTE — Telephone Encounter (Signed)
Also need refill on Cymabalta

## 2019-12-15 ENCOUNTER — Other Ambulatory Visit: Payer: Self-pay | Admitting: Family Medicine

## 2019-12-15 DIAGNOSIS — F320 Major depressive disorder, single episode, mild: Secondary | ICD-10-CM

## 2019-12-15 DIAGNOSIS — N289 Disorder of kidney and ureter, unspecified: Secondary | ICD-10-CM

## 2019-12-15 DIAGNOSIS — F419 Anxiety disorder, unspecified: Secondary | ICD-10-CM

## 2019-12-15 NOTE — Telephone Encounter (Signed)
Received request multiple times for tramadol.   Noted that prescription was refilled on 12/03/2019 #30 tabs.   Call placed to pharmacy. Reports that patient has requested as medication is only 10 day supply as she can take TID PRN.   Call placed to patient to inquire. [place].

## 2019-12-16 ENCOUNTER — Other Ambulatory Visit: Payer: Self-pay | Admitting: *Deleted

## 2019-12-16 DIAGNOSIS — F419 Anxiety disorder, unspecified: Secondary | ICD-10-CM

## 2019-12-16 DIAGNOSIS — F320 Major depressive disorder, single episode, mild: Secondary | ICD-10-CM

## 2019-12-16 DIAGNOSIS — N289 Disorder of kidney and ureter, unspecified: Secondary | ICD-10-CM

## 2019-12-16 MED ORDER — TRAMADOL HCL 50 MG PO TABS: 50.0000 mg | ORAL_TABLET | Freq: Three times a day (TID) | ORAL | 0 refills | Status: DC | PRN

## 2019-12-16 MED ORDER — DULOXETINE HCL 60 MG PO CPEP: 60.0000 mg | ORAL_CAPSULE | Freq: Every day | ORAL | 3 refills | Status: DC

## 2019-12-16 NOTE — Telephone Encounter (Signed)
Not a patient here. 

## 2019-12-16 NOTE — Telephone Encounter (Signed)
Not a patient here, last saw Juluis Pitch

## 2019-12-16 NOTE — Telephone Encounter (Signed)
Received call from patient.   Reports that she has been taking more Tramadol since she is having arthritis flare.   Requested refill of Tramadol.  Ok to refill??  Last office visit 07/26/2019. Last refill 12/03/2019.  Also requested refill on Cymbalta. Prescription sent to pharmacy.

## 2019-12-27 ENCOUNTER — Other Ambulatory Visit: Payer: Self-pay | Admitting: Family Medicine

## 2019-12-27 DIAGNOSIS — N289 Disorder of kidney and ureter, unspecified: Secondary | ICD-10-CM

## 2019-12-27 NOTE — Telephone Encounter (Signed)
Ok to refill??  Last office visit 07/26/2019.  Last refill 12/16/2019.

## 2019-12-29 NOTE — Telephone Encounter (Signed)
Ok to refill 

## 2019-12-30 ENCOUNTER — Other Ambulatory Visit: Payer: Self-pay | Admitting: Family Medicine

## 2020-01-16 ENCOUNTER — Other Ambulatory Visit: Payer: Self-pay | Admitting: Family Medicine

## 2020-01-16 DIAGNOSIS — G4700 Insomnia, unspecified: Secondary | ICD-10-CM

## 2020-01-16 DIAGNOSIS — N289 Disorder of kidney and ureter, unspecified: Secondary | ICD-10-CM

## 2020-01-17 NOTE — Telephone Encounter (Signed)
Ok to refill??  Last office visit 07/08/2019.  Last refill on tramadol 12/28/2019.  Last refill on 10/19/2019. #2 refills.

## 2020-01-22 ENCOUNTER — Other Ambulatory Visit: Payer: Self-pay | Admitting: Family Medicine

## 2020-01-24 NOTE — Telephone Encounter (Signed)
Ok to refill 

## 2020-02-21 ENCOUNTER — Telehealth: Payer: Self-pay | Admitting: Family Medicine

## 2020-02-21 NOTE — Telephone Encounter (Signed)
Patient left vm requesting a refill on her tramadol.  CB# [phone]

## 2020-02-22 ENCOUNTER — Other Ambulatory Visit: Payer: Self-pay

## 2020-02-22 DIAGNOSIS — N289 Disorder of kidney and ureter, unspecified: Secondary | ICD-10-CM

## 2020-02-22 MED ORDER — TRAMADOL HCL 50 MG PO TABS: 50.0000 mg | ORAL_TABLET | Freq: Three times a day (TID) | ORAL | 2 refills | Status: DC | PRN

## 2020-02-22 NOTE — Telephone Encounter (Signed)
Requested Prescriptions   Pending Prescriptions Disp Refills  . traMADol (ULTRAM) 50 MG tablet 30 tablet 2    Sig: Take 1 tablet (50 mg total) by mouth every 8 (eight) hours as needed.     Last OV 07/26/2019  Last written 01/17/2020

## 2020-02-24 ENCOUNTER — Other Ambulatory Visit: Payer: Self-pay

## 2020-02-24 MED ORDER — CYCLOBENZAPRINE HCL 10 MG PO TABS: ORAL_TABLET | ORAL | 0 refills | Status: DC

## 2020-03-21 ENCOUNTER — Other Ambulatory Visit: Payer: Self-pay | Admitting: Family Medicine

## 2020-03-21 DIAGNOSIS — G4700 Insomnia, unspecified: Secondary | ICD-10-CM

## 2020-03-21 MED ORDER — ZOLPIDEM TARTRATE ER 12.5 MG PO TBCR: 12.5000 mg | EXTENDED_RELEASE_TABLET | Freq: Every evening | ORAL | 2 refills | Status: DC | PRN

## 2020-03-21 NOTE — Telephone Encounter (Signed)
Ok to refill??  Last office visit 07/08/2019.  Last refill 01/17/2020, #2 refills.

## 2020-03-21 NOTE — Telephone Encounter (Signed)
CB# [phone] Pt lvm refill Ambien

## 2020-03-23 ENCOUNTER — Other Ambulatory Visit: Payer: Self-pay

## 2020-03-23 MED ORDER — ATENOLOL 25 MG PO TABS: 25.0000 mg | ORAL_TABLET | Freq: Every day | ORAL | 0 refills | Status: DC

## 2020-03-24 ENCOUNTER — Other Ambulatory Visit: Payer: Self-pay

## 2020-03-24 DIAGNOSIS — F419 Anxiety disorder, unspecified: Secondary | ICD-10-CM

## 2020-03-24 MED ORDER — CYCLOBENZAPRINE HCL 10 MG PO TABS
ORAL_TABLET | ORAL | 0 refills | Status: DC
Start: 2020-03-24 — End: 2020-04-21

## 2020-03-24 MED ORDER — DIAZEPAM 5 MG PO TABS: ORAL_TABLET | ORAL | 3 refills | Status: DC

## 2020-03-24 NOTE — Telephone Encounter (Signed)
Requested Prescriptions   Pending Prescriptions Disp Refills  . diazepam (VALIUM) 5 MG tablet 60 tablet 3    Sig: TAKE ONE TABLET BY MOUTH TWICE A DAY AS NEEDED FOR ANXIETY  . cyclobenzaprine (FLEXERIL) 10 MG tablet 60 tablet 0    Sig: TAKE ONE TABLET BY MOUTH THREE TIMES A DAY AS NEEDED FOR MUSCLE SPASMS     Last OV 07/08/2019   Last written 02/24/20

## 2020-04-04 ENCOUNTER — Other Ambulatory Visit: Payer: Self-pay

## 2020-04-04 ENCOUNTER — Ambulatory Visit (INDEPENDENT_AMBULATORY_CARE_PROVIDER_SITE_OTHER): Payer: 59 | Admitting: Family Medicine

## 2020-04-04 VITALS — BP 120/80 | HR 92 | Temp 98.4°F | Ht 64.0 in | Wt 160.0 lb

## 2020-04-04 DIAGNOSIS — M25562 Pain in left knee: Secondary | ICD-10-CM

## 2020-04-04 DIAGNOSIS — G8929 Other chronic pain: Secondary | ICD-10-CM

## 2020-04-04 DIAGNOSIS — M25561 Pain in right knee: Secondary | ICD-10-CM | POA: Diagnosis not present

## 2020-04-04 NOTE — Progress Notes (Signed)
Subjective:    Patient ID: Erika Harvey, female    DOB: 1960/07/28, 59 y.o.   MRN: [id number]  HPI Patient is here today for bilateral knee pain.  She has a history of 2 previous left knee surgeries.  She has soft tissue swelling anterior and slightly inferior to the patella.  Is extremely tender to palpation in that area.  She states that that is chronic.  The swelling is over and inferior to the lateral joint line.  She also reports pain in the right knee.  The pain in both knees is worse going up and down steps, rising from a seated position, walking.  She reports crepitus in both knees.  She is requesting a cortisone injection. Past Medical History:  Diagnosis Date  . Allergy   . Anxiety   . Chronic low back pain   . Fibroid 03/30/2015  . Frequent UTI   . Hypertension   . Vaginal bleeding 03/30/2015   Past Surgical History:  Procedure Laterality Date  . BACK SURGERY  05-16-2015  . KNEE SURGERY     left  . KNEE SURGERY Left 07/08/2012  . WISDOM TOOTH EXTRACTION     Current Outpatient Medications on File Prior to Visit  Medication Sig Dispense Refill  . atenolol (TENORMIN) 25 MG tablet Take 1 tablet (25 mg total) by mouth daily. 90 tablet 0  . atorvastatin (LIPITOR) 40 MG tablet TAKE ONE TABLET BY MOUTH DAILY 90 tablet 0  . calcium-vitamin D (OSCAL WITH D) 250-125 MG-UNIT per tablet Take 1 tablet by mouth daily.      . cyclobenzaprine (FLEXERIL) 10 MG tablet TAKE ONE TABLET BY MOUTH THREE TIMES A DAY AS NEEDED FOR MUSCLE SPASMS 60 tablet 0  . diazepam (VALIUM) 5 MG tablet TAKE ONE TABLET BY MOUTH TWICE A DAY AS NEEDED FOR ANXIETY 60 tablet 3  . diclofenac Sodium (VOLTAREN) 1 % GEL Apply 2 g topically 4 (four) times daily. 100 g 5  . DULoxetine (CYMBALTA) 60 MG capsule Take 1 capsule (60 mg total) by mouth daily. 90 capsule 3  . fish oil-omega-3 fatty acids 1000 MG capsule Take 2 g by mouth 2 (two) times daily.      Marland Kitchen losartan (COZAAR) 100 MG tablet Take 1 tablet (100 mg total)  by mouth daily. 90 tablet 2  . omeprazole (PRILOSEC) 20 MG capsule Take 1 capsule (20 mg total) by mouth daily. 90 capsule 3  . traMADol (ULTRAM) 50 MG tablet Take 1 tablet (50 mg total) by mouth every 8 (eight) hours as needed. 30 tablet 2  . zolpidem (AMBIEN CR) 12.5 MG CR tablet Take 1 tablet (12.5 mg total) by mouth at bedtime as needed for sleep. 30 tablet 2   No current facility-administered medications on file prior to visit.   Allergies  Allergen Reactions  . Norvasc [Amlodipine Besylate] Swelling and Rash   Social History   Socioeconomic History  . Marital status: Married    Spouse name: Not on file  . Number of children: Not on file  . Years of education: Not on file  . Highest education level: Not on file  Occupational History  . Not on file  Tobacco Use  . Smoking status: Never Smoker  . Smokeless tobacco: Never Used  Substance and Sexual Activity  . Alcohol use: Yes    Alcohol/week: 5.0 standard drinks    Types: 5 Cans of beer per week  . Drug use: No  . Sexual activity: Yes  Birth control/protection: Pill  Other Topics Concern  . Not on file  Social History Narrative  . Not on file   Social Determinants of Health   Financial Resource Strain:   . Difficulty of Paying Living Expenses: Not on file  Food Insecurity:   . Worried About Charity fundraiser in the Last Year: Not on file  . Ran Out of Food in the Last Year: Not on file  Transportation Needs:   . Lack of Transportation (Medical): Not on file  . Lack of Transportation (Non-Medical): Not on file  Physical Activity:   . Days of Exercise per Week: Not on file  . Minutes of Exercise per Session: Not on file  Stress:   . Feeling of Stress : Not on file  Social Connections:   . Frequency of Communication with Friends and Family: Not on file  . Frequency of Social Gatherings with Friends and Family: Not on file  . Attends Religious Services: Not on file  . Active Member of Clubs or Organizations:  Not on file  . Attends Archivist Meetings: Not on file  . Marital Status: Not on file  Intimate Partner Violence:   . Fear of Current or Ex-Partner: Not on file  . Emotionally Abused: Not on file  . Physically Abused: Not on file  . Sexually Abused: Not on file     Review of Systems  All other systems reviewed and are negative.      Objective:   Physical Exam Vitals reviewed.  Constitutional:      Appearance: Normal appearance.  Cardiovascular:     Rate and Rhythm: Normal rate and regular rhythm.     Heart sounds: Normal heart sounds.  Pulmonary:     Effort: Pulmonary effort is normal. No respiratory distress.     Breath sounds: Normal breath sounds. No stridor. No wheezing, rhonchi or rales.  Chest:     Chest wall: No tenderness.  Abdominal:     General: Abdomen is flat. Bowel sounds are normal.     Palpations: Abdomen is soft.  Musculoskeletal:     Lumbar back: Spasms present. No tenderness or bony tenderness. Decreased range of motion.     Right knee: Bony tenderness and crepitus present. Decreased range of motion. No LCL laxity, MCL laxity, ACL laxity or PCL laxity.     Left knee: Deformity, bony tenderness and crepitus present. Decreased range of motion. No LCL laxity, MCL laxity, ACL laxity or PCL laxity.      Legs:  Neurological:     Mental Status: She is alert.           Assessment & Plan:  Bilateral chronic knee pain  Patient has bilateral knee pain.  This is a chronic issue for her and she states that she has had it for years.  I suspect it is due to osteoarthritis.  She has seen orthopedics in the past we have performed x-rays.  She has had 2 arthroscopic surgeries on her left knee.  She is requesting a cortisone injection.  Using sterile technique, I injected the right knee with a mixture of 2 cc lidocaine, 2 cc of Marcaine, and 2 cc of 40 mg/mL Kenalog.  Then using sterile technique I injected the left knee with the same mixture.  The patient  tolerated the procedure well without complications.

## 2020-04-05 ENCOUNTER — Other Ambulatory Visit: Payer: Self-pay

## 2020-04-05 DIAGNOSIS — N289 Disorder of kidney and ureter, unspecified: Secondary | ICD-10-CM

## 2020-04-06 MED ORDER — TRAMADOL HCL 50 MG PO TABS
50.0000 mg | ORAL_TABLET | Freq: Three times a day (TID) | ORAL | 2 refills | Status: DC | PRN
Start: 2020-04-06 — End: 2020-05-10

## 2020-04-17 ENCOUNTER — Other Ambulatory Visit: Payer: Self-pay | Admitting: Family Medicine

## 2020-04-17 DIAGNOSIS — I10 Essential (primary) hypertension: Secondary | ICD-10-CM

## 2020-04-17 NOTE — Telephone Encounter (Signed)
Received fax from [facility] [address] Requesting refill on losartan (COZAAR) 100 MG tablet QTY 90     Sig: Take 1 tablet (100 mg total) by mouth daily

## 2020-04-19 MED ORDER — LOSARTAN POTASSIUM 100 MG PO TABS: 100.0000 mg | ORAL_TABLET | Freq: Every day | ORAL | 2 refills | Status: DC

## 2020-04-21 ENCOUNTER — Other Ambulatory Visit: Payer: Self-pay | Admitting: Family Medicine

## 2020-04-21 MED ORDER — CYCLOBENZAPRINE HCL 10 MG PO TABS
ORAL_TABLET | ORAL | 0 refills | Status: DC
Start: 2020-04-21 — End: 2020-05-22

## 2020-04-21 NOTE — Telephone Encounter (Signed)
Patient needs refill on Cyclobenzaprine 10mg  [facility] [address] [place]

## 2020-04-21 NOTE — Telephone Encounter (Signed)
Med refilled.

## 2020-05-03 ENCOUNTER — Other Ambulatory Visit: Payer: Self-pay

## 2020-05-03 MED ORDER — ATORVASTATIN CALCIUM 40 MG PO TABS: 40.0000 mg | ORAL_TABLET | Freq: Every day | ORAL | 1 refills | Status: DC

## 2020-05-10 ENCOUNTER — Other Ambulatory Visit: Payer: Self-pay

## 2020-05-10 DIAGNOSIS — N289 Disorder of kidney and ureter, unspecified: Secondary | ICD-10-CM

## 2020-05-10 MED ORDER — ATORVASTATIN CALCIUM 40 MG PO TABS
40.0000 mg | ORAL_TABLET | Freq: Every day | ORAL | 1 refills | Status: DC
Start: 2020-05-10 — End: 2021-07-13

## 2020-05-10 NOTE — Telephone Encounter (Signed)
Rx refilled.

## 2020-05-10 NOTE — Telephone Encounter (Signed)
Ok to refill??  Last office visit 04/04/2020.  Last refill 04/06/2020.

## 2020-05-11 MED ORDER — TRAMADOL HCL 50 MG PO TABS: 50.0000 mg | ORAL_TABLET | Freq: Three times a day (TID) | ORAL | 2 refills | Status: DC | PRN

## 2020-05-20 ENCOUNTER — Other Ambulatory Visit: Payer: Self-pay | Admitting: Family Medicine

## 2020-06-19 ENCOUNTER — Other Ambulatory Visit: Payer: Self-pay | Admitting: Family Medicine

## 2020-06-20 NOTE — Telephone Encounter (Signed)
Ok to refill 

## 2020-06-22 ENCOUNTER — Other Ambulatory Visit: Payer: Self-pay

## 2020-06-22 MED ORDER — ATENOLOL 25 MG PO TABS: 25.0000 mg | ORAL_TABLET | Freq: Every day | ORAL | 0 refills | Status: DC

## 2020-06-23 ENCOUNTER — Other Ambulatory Visit: Payer: Self-pay

## 2020-06-23 MED ORDER — ATENOLOL 25 MG PO TABS: 25.0000 mg | ORAL_TABLET | Freq: Every day | ORAL | 0 refills | Status: DC

## 2020-06-27 ENCOUNTER — Other Ambulatory Visit: Payer: Self-pay

## 2020-06-27 DIAGNOSIS — N289 Disorder of kidney and ureter, unspecified: Secondary | ICD-10-CM

## 2020-06-27 MED ORDER — TRAMADOL HCL 50 MG PO TABS: 50.0000 mg | ORAL_TABLET | Freq: Three times a day (TID) | ORAL | 2 refills | Status: DC | PRN

## 2020-07-26 ENCOUNTER — Other Ambulatory Visit: Payer: Self-pay | Admitting: Family Medicine

## 2020-07-26 DIAGNOSIS — F419 Anxiety disorder, unspecified: Secondary | ICD-10-CM

## 2020-07-26 DIAGNOSIS — G4700 Insomnia, unspecified: Secondary | ICD-10-CM

## 2020-07-26 NOTE — Telephone Encounter (Signed)
cb [phone] Pt  Called in wanting a refill of these meds diazepam (VALIUM) 5 MG tablet  zolpidem (AMBIEN CR) 12.5 MG CR tablet cyclobenzaprine (FLEXERIL) 10 MG tablet

## 2020-07-27 MED ORDER — CYCLOBENZAPRINE HCL 10 MG PO TABS: 10.0000 mg | ORAL_TABLET | Freq: Three times a day (TID) | ORAL | 0 refills | Status: DC | PRN

## 2020-07-27 MED ORDER — ZOLPIDEM TARTRATE ER 12.5 MG PO TBCR: 12.5000 mg | EXTENDED_RELEASE_TABLET | Freq: Every evening | ORAL | 2 refills | Status: DC | PRN

## 2020-07-27 MED ORDER — DIAZEPAM 5 MG PO TABS: ORAL_TABLET | ORAL | 3 refills | Status: DC

## 2020-08-22 ENCOUNTER — Other Ambulatory Visit: Payer: Self-pay | Admitting: Family Medicine

## 2020-08-22 DIAGNOSIS — N289 Disorder of kidney and ureter, unspecified: Secondary | ICD-10-CM

## 2020-08-22 MED ORDER — TRAMADOL HCL 50 MG PO TABS: 50.0000 mg | ORAL_TABLET | Freq: Three times a day (TID) | ORAL | 2 refills | Status: DC | PRN

## 2020-08-22 NOTE — Telephone Encounter (Signed)
Pt called needing a refill of   traMADol (ULTRAM) 50 MG tablet  cyclobenzaprine (FLEXERIL) 10 MG tablet   Sent to [facility] on [address] in [place]  Cb# [phone]

## 2020-08-25 ENCOUNTER — Other Ambulatory Visit: Payer: Self-pay | Admitting: Family Medicine

## 2020-08-25 MED ORDER — CYCLOBENZAPRINE HCL 10 MG PO TABS: 10.0000 mg | ORAL_TABLET | Freq: Three times a day (TID) | ORAL | 0 refills | Status: DC | PRN

## 2020-08-25 NOTE — Telephone Encounter (Signed)
Pt called needing a refill of  (FLEXERIL) 10 MG tablet sent to [facility] on [address].  Cb# [phone]

## 2020-08-25 NOTE — Telephone Encounter (Signed)
Ok to refill 

## 2020-09-07 ENCOUNTER — Other Ambulatory Visit: Payer: Self-pay

## 2020-09-07 ENCOUNTER — Ambulatory Visit (INDEPENDENT_AMBULATORY_CARE_PROVIDER_SITE_OTHER): Payer: 59 | Admitting: Family Medicine

## 2020-09-07 ENCOUNTER — Encounter: Payer: Self-pay | Admitting: Family Medicine

## 2020-09-07 VITALS — BP 122/62 | HR 82 | Temp 97.9°F | Resp 14 | Ht 64.0 in | Wt 156.0 lb

## 2020-09-07 DIAGNOSIS — G8929 Other chronic pain: Secondary | ICD-10-CM | POA: Diagnosis not present

## 2020-09-07 DIAGNOSIS — M25561 Pain in right knee: Secondary | ICD-10-CM | POA: Diagnosis not present

## 2020-09-07 DIAGNOSIS — M25562 Pain in left knee: Secondary | ICD-10-CM

## 2020-09-07 NOTE — Progress Notes (Signed)
Subjective:    Patient ID: Erika Harvey, female    DOB: [birth year], 60 y.o.   MRN: [id number]  Knee Pain   03/2020 Patient is here today for bilateral knee pain.  She has a history of 2 previous left knee surgeries.  She has soft tissue swelling anterior and slightly inferior to the patella.  Is extremely tender to palpation in that area.  She states that that is chronic.  The swelling is over and inferior to the lateral joint line.  She also reports pain in the right knee.  The pain in both knees is worse going up and down steps, rising from a seated position, walking.  She reports crepitus in both knees.  She is requesting a cortisone injection.  At that time, my plan was: Patient has bilateral knee pain.  This is a chronic issue for her and she states that she has had it for years.  I suspect it is due to osteoarthritis.  She has seen orthopedics in the past and they have performed x-rays.  She has had 2 arthroscopic surgeries on her left knee.  She is requesting a cortisone injection.  Using sterile technique, I injected the right knee with a mixture of 2 cc lidocaine, 2 cc of Marcaine, and 2 cc of 40 mg/mL Kenalog.  Then using sterile technique I injected the left knee with the same mixture.  The patient tolerated the procedure well without complications.  09/07/20 Patient returns requesting cortisone injections in both knees.  She states that the first cortisone injections lasted until mid January.  She reports pain and pressure in both knees with walking.  There is still a soft tissue accumulation anterior to the left knee as shown in the photograph below.  This is not an effusion.  It spongy and fatty in nature.  Is also tender to the touch.  She reports pain going up and down stairs. Past Medical History:  Diagnosis Date  . Allergy   . Anxiety   . Chronic low back pain   . Fibroid 03/30/2015  . Frequent UTI   . Hypertension   . Vaginal bleeding 03/30/2015   Past Surgical History:   Procedure Laterality Date  . BACK SURGERY  05-16-2015  . KNEE SURGERY     left  . KNEE SURGERY Left 07/08/2012  . WISDOM TOOTH EXTRACTION     Current Outpatient Medications on File Prior to Visit  Medication Sig Dispense Refill  . atenolol (TENORMIN) 25 MG tablet Take 1 tablet (25 mg total) by mouth daily. 90 tablet 0  . atorvastatin (LIPITOR) 40 MG tablet Take 1 tablet (40 mg total) by mouth daily. 90 tablet 1  . calcium-vitamin D (OSCAL WITH D) 250-125 MG-UNIT per tablet Take 1 tablet by mouth daily.      . cyclobenzaprine (FLEXERIL) 10 MG tablet Take 1 tablet (10 mg total) by mouth 3 (three) times daily as needed for muscle spasms. 60 tablet 0  . diazepam (VALIUM) 5 MG tablet TAKE ONE TABLET BY MOUTH TWICE A DAY AS NEEDED FOR ANXIETY 60 tablet 3  . diclofenac Sodium (VOLTAREN) 1 % GEL Apply 2 g topically 4 (four) times daily. 100 g 5  . DULoxetine (CYMBALTA) 60 MG capsule Take 1 capsule (60 mg total) by mouth daily. 90 capsule 3  . fish oil-omega-3 fatty acids 1000 MG capsule Take 2 g by mouth 2 (two) times daily.      Marland Kitchen losartan (COZAAR) 100 MG tablet Take 1 tablet (  100 mg total) by mouth daily. 90 tablet 2  . omeprazole (PRILOSEC) 20 MG capsule Take 1 capsule (20 mg total) by mouth daily. 90 capsule 3  . traMADol (ULTRAM) 50 MG tablet Take 1 tablet (50 mg total) by mouth every 8 (eight) hours as needed. 30 tablet 2  . zolpidem (AMBIEN CR) 12.5 MG CR tablet Take 1 tablet (12.5 mg total) by mouth at bedtime as needed for sleep. 30 tablet 2   No current facility-administered medications on file prior to visit.   Allergies  Allergen Reactions  . Norvasc [Amlodipine Besylate] Swelling and Rash   Social History   Socioeconomic History  . Marital status: Married    Spouse name: Not on file  . Number of children: Not on file  . Years of education: Not on file  . Highest education level: Not on file  Occupational History  . Not on file  Tobacco Use  . Smoking status: Never Smoker   . Smokeless tobacco: Never Used  Substance and Sexual Activity  . Alcohol use: Yes    Alcohol/week: 5.0 standard drinks    Types: 5 Cans of beer per week  . Drug use: No  . Sexual activity: Yes    Birth control/protection: Pill  Other Topics Concern  . Not on file  Social History Narrative  . Not on file   Social Determinants of Health   Financial Resource Strain: Not on file  Food Insecurity: Not on file  Transportation Needs: Not on file  Physical Activity: Not on file  Stress: Not on file  Social Connections: Not on file  Intimate Partner Violence: Not on file     Review of Systems  All other systems reviewed and are negative.      Objective:   Physical Exam Vitals reviewed.  Constitutional:      Appearance: Normal appearance.  Cardiovascular:     Rate and Rhythm: Normal rate and regular rhythm.     Heart sounds: Normal heart sounds.  Pulmonary:     Effort: Pulmonary effort is normal. No respiratory distress.     Breath sounds: Normal breath sounds. No stridor. No wheezing, rhonchi or rales.  Chest:     Chest wall: No tenderness.  Abdominal:     General: Abdomen is flat. Bowel sounds are normal.     Palpations: Abdomen is soft.  Musculoskeletal:     Right knee: Bony tenderness and crepitus present. Decreased range of motion. No LCL laxity, MCL laxity, ACL laxity or PCL laxity.     Left knee: Bony tenderness and crepitus present. Decreased range of motion. No LCL laxity, MCL laxity, ACL laxity or PCL laxity. Neurological:     Mental Status: She is alert.      Soft tissue swelling anterior to knee.       Assessment & Plan:  Bilateral chronic knee pain  Likely due to osteoarthritis in both knees.  I would like the patient to get an x-ray of the left knee given the abnormality seen on exam.  Using sterile technique, I injected the right knee with 2 cc lidocaine, 2 cc of Marcaine, and 2 cc of 40 mg/mL Kenalog.  Then using sterile technique I injected the  left knee with 2 cc lidocaine, 2 cc of Marcaine, and 2 cc of 40 mg/mL Kenalog.

## 2020-09-12 ENCOUNTER — Other Ambulatory Visit: Payer: Self-pay

## 2020-09-12 ENCOUNTER — Ambulatory Visit
Admission: RE | Admit: 2020-09-12 | Discharge: 2020-09-12 | Disposition: A | Discharge status: Home / Self Care | Payer: 59 | Source: Ambulatory Visit | Attending: Family Medicine | Admitting: Family Medicine

## 2020-09-12 DIAGNOSIS — G8929 Other chronic pain: Secondary | ICD-10-CM

## 2020-09-12 DIAGNOSIS — M25562 Pain in left knee: Secondary | ICD-10-CM

## 2020-09-25 ENCOUNTER — Other Ambulatory Visit: Payer: Self-pay | Admitting: Family Medicine

## 2020-09-25 DIAGNOSIS — N289 Disorder of kidney and ureter, unspecified: Secondary | ICD-10-CM

## 2020-09-25 DIAGNOSIS — G4700 Insomnia, unspecified: Secondary | ICD-10-CM

## 2020-09-25 MED ORDER — ZOLPIDEM TARTRATE ER 12.5 MG PO TBCR
12.5000 mg | EXTENDED_RELEASE_TABLET | Freq: Every evening | ORAL | 2 refills | Status: DC | PRN
Start: 2020-09-25 — End: 2020-11-03

## 2020-09-25 MED ORDER — TRAMADOL HCL 50 MG PO TABS: 50.0000 mg | ORAL_TABLET | Freq: Three times a day (TID) | ORAL | 2 refills | Status: DC | PRN

## 2020-09-25 NOTE — Telephone Encounter (Signed)
Ok to refill??  Last office visit 09/07/2020.  Last refill on Ambien 07/27/2020.  Last refill on Tramadol 08/29/2020.

## 2020-09-25 NOTE — Telephone Encounter (Signed)
Pt called needing a refill of   traMADol (ULTRAM) 50 MG tablet cyclobenzaprine (FLEXERIL) 10 MG tablet   cb#: [phone]

## 2020-09-29 ENCOUNTER — Other Ambulatory Visit: Payer: Self-pay | Admitting: Family Medicine

## 2020-09-29 DIAGNOSIS — Z1211 Encounter for screening for malignant neoplasm of colon: Secondary | ICD-10-CM

## 2020-09-29 MED ORDER — ATENOLOL 25 MG PO TABS: 25.0000 mg | ORAL_TABLET | Freq: Every day | ORAL | 0 refills | Status: DC

## 2020-09-29 MED ORDER — CYCLOBENZAPRINE HCL 10 MG PO TABS: 10.0000 mg | ORAL_TABLET | Freq: Three times a day (TID) | ORAL | 2 refills | Status: DC | PRN

## 2020-09-29 NOTE — Telephone Encounter (Signed)
Referral orders placed for Colonoscopy.   Call placed to patient with results. [place].   Ok to refill Flexeril?

## 2020-09-29 NOTE — Telephone Encounter (Signed)
Patient requesting a refill on her cyclobenzaprine (FLEXERIL) 10 MG tablet   She states she is also due for a colonoscopy  She also would like the results from her x-ray   CB# [phone]

## 2020-09-29 NOTE — Addendum Note (Signed)
Addended by: Sheral Flow on: 09/29/2020 05:04 PM   Modules accepted: Orders

## 2020-10-24 ENCOUNTER — Other Ambulatory Visit: Payer: Self-pay

## 2020-10-24 MED ORDER — OMEPRAZOLE 20 MG PO CPDR: 20.0000 mg | DELAYED_RELEASE_CAPSULE | Freq: Every day | ORAL | 3 refills | Status: DC

## 2020-10-25 ENCOUNTER — Other Ambulatory Visit: Payer: Self-pay | Admitting: Family Medicine

## 2020-10-25 DIAGNOSIS — N289 Disorder of kidney and ureter, unspecified: Secondary | ICD-10-CM

## 2020-10-25 NOTE — Telephone Encounter (Signed)
Received fax from [facility] requesting refill on traMADol (ULTRAM) 50 MG tablet

## 2020-10-27 MED ORDER — TRAMADOL HCL 50 MG PO TABS: 50.0000 mg | ORAL_TABLET | Freq: Three times a day (TID) | ORAL | 2 refills | Status: DC | PRN

## 2020-11-01 ENCOUNTER — Telehealth: Payer: Self-pay | Admitting: Family Medicine

## 2020-11-01 NOTE — Telephone Encounter (Signed)
Call placed to patient. LMTRC.  

## 2020-11-01 NOTE — Telephone Encounter (Signed)
MyChart message sent to patient:   Hi!   Thank you for using MyChart.    We have attempted to reach you in regards to your X-Ray results.    Dr Dennard Schaumann has reviewed your imaging and his recommendations are as follows: They see mild arthritis and also calcification and scar in front of the knee which I believe is the soft tissue mass that we can palpate.  If not improving, recommend seeing orthopedics   If you have any other concerns, please don't hesitate to contact our office.

## 2020-11-01 NOTE — Telephone Encounter (Signed)
Pt called stating she had xrays of her knee in early March and has not heard anything about her results. She would like to discuss her xrays with the dr or the nurse.   Cb#: [phone]

## 2020-11-02 ENCOUNTER — Other Ambulatory Visit: Payer: Self-pay | Admitting: *Deleted

## 2020-11-02 DIAGNOSIS — G8929 Other chronic pain: Secondary | ICD-10-CM

## 2020-11-02 NOTE — Telephone Encounter (Signed)
Call placed to patient and patient made aware.   Referral orders placed.  

## 2020-11-03 ENCOUNTER — Other Ambulatory Visit: Payer: Self-pay | Admitting: *Deleted

## 2020-11-03 DIAGNOSIS — G4700 Insomnia, unspecified: Secondary | ICD-10-CM

## 2020-11-03 MED ORDER — ZOLPIDEM TARTRATE ER 12.5 MG PO TBCR: 12.5000 mg | EXTENDED_RELEASE_TABLET | Freq: Every evening | ORAL | 2 refills | Status: DC | PRN

## 2020-11-03 NOTE — Telephone Encounter (Signed)
Ok to refill??  Last office visit 09/07/2020.  Last refill 09/25/2020.

## 2020-11-08 ENCOUNTER — Encounter: Payer: Self-pay | Admitting: Orthopaedic Surgery

## 2020-11-21 ENCOUNTER — Other Ambulatory Visit: Payer: Self-pay | Admitting: *Deleted

## 2020-11-21 DIAGNOSIS — F419 Anxiety disorder, unspecified: Secondary | ICD-10-CM

## 2020-11-21 MED ORDER — DIAZEPAM 5 MG PO TABS
ORAL_TABLET | ORAL | 3 refills | Status: DC
Start: 2020-11-21 — End: 2021-03-20

## 2020-11-21 NOTE — Telephone Encounter (Signed)
Received fax requesting refill on Diazepam.  Ok to refill??  Last office visit 09/07/2020.  Last refill 07/27/2020, #3 refills.

## 2020-11-29 ENCOUNTER — Other Ambulatory Visit: Payer: Self-pay | Admitting: *Deleted

## 2020-11-29 DIAGNOSIS — N289 Disorder of kidney and ureter, unspecified: Secondary | ICD-10-CM

## 2020-11-29 NOTE — Telephone Encounter (Signed)
Received fax requesting refill on Tramadol.   Ok to refill??  Last office visit 09/07/2020.  Last refill 10/27/2020.

## 2020-11-30 MED ORDER — TRAMADOL HCL 50 MG PO TABS: 50.0000 mg | ORAL_TABLET | Freq: Three times a day (TID) | ORAL | 2 refills | Status: DC | PRN

## 2020-12-14 ENCOUNTER — Other Ambulatory Visit: Payer: Self-pay | Admitting: *Deleted

## 2020-12-14 MED ORDER — CYCLOBENZAPRINE HCL 10 MG PO TABS: 10.0000 mg | ORAL_TABLET | Freq: Three times a day (TID) | ORAL | 2 refills | Status: DC | PRN

## 2020-12-14 NOTE — Telephone Encounter (Signed)
Ok to refill 

## 2021-01-08 ENCOUNTER — Other Ambulatory Visit: Payer: Self-pay | Admitting: Family Medicine

## 2021-01-08 DIAGNOSIS — N289 Disorder of kidney and ureter, unspecified: Secondary | ICD-10-CM

## 2021-01-18 ENCOUNTER — Other Ambulatory Visit: Payer: Self-pay | Admitting: *Deleted

## 2021-01-18 DIAGNOSIS — I10 Essential (primary) hypertension: Secondary | ICD-10-CM

## 2021-01-18 DIAGNOSIS — G4700 Insomnia, unspecified: Secondary | ICD-10-CM

## 2021-01-18 MED ORDER — LOSARTAN POTASSIUM 100 MG PO TABS: 100.0000 mg | ORAL_TABLET | Freq: Every day | ORAL | 2 refills | Status: DC

## 2021-01-18 MED ORDER — ZOLPIDEM TARTRATE ER 12.5 MG PO TBCR: 12.5000 mg | EXTENDED_RELEASE_TABLET | Freq: Every evening | ORAL | 2 refills | Status: DC | PRN

## 2021-01-18 NOTE — Telephone Encounter (Signed)
Received fax requesting refill on Ambien.    Ok to refill??  Last office visit 09/07/2020.  Last refill 10/14/2020, #2 refills.

## 2021-01-25 ENCOUNTER — Other Ambulatory Visit: Payer: Self-pay | Admitting: Family Medicine

## 2021-01-25 DIAGNOSIS — N289 Disorder of kidney and ureter, unspecified: Secondary | ICD-10-CM

## 2021-01-27 ENCOUNTER — Other Ambulatory Visit: Payer: Self-pay | Admitting: Family Medicine

## 2021-01-27 DIAGNOSIS — N289 Disorder of kidney and ureter, unspecified: Secondary | ICD-10-CM

## 2021-01-30 ENCOUNTER — Other Ambulatory Visit: Payer: Self-pay | Admitting: Family Medicine

## 2021-01-30 DIAGNOSIS — N289 Disorder of kidney and ureter, unspecified: Secondary | ICD-10-CM

## 2021-01-31 ENCOUNTER — Other Ambulatory Visit: Payer: Self-pay | Admitting: Family Medicine

## 2021-01-31 DIAGNOSIS — N289 Disorder of kidney and ureter, unspecified: Secondary | ICD-10-CM

## 2021-02-06 ENCOUNTER — Other Ambulatory Visit: Payer: Self-pay | Admitting: Family Medicine

## 2021-02-06 DIAGNOSIS — N289 Disorder of kidney and ureter, unspecified: Secondary | ICD-10-CM

## 2021-02-06 NOTE — Telephone Encounter (Signed)
Ok to refill??  Last office visit 09/07/2020.  Last refill 01/09/2021.

## 2021-02-19 ENCOUNTER — Other Ambulatory Visit: Payer: Self-pay | Admitting: Family Medicine

## 2021-02-19 DIAGNOSIS — N289 Disorder of kidney and ureter, unspecified: Secondary | ICD-10-CM

## 2021-03-01 ENCOUNTER — Other Ambulatory Visit: Payer: Self-pay | Admitting: Nurse Practitioner

## 2021-03-01 NOTE — Telephone Encounter (Signed)
Ok to refill 

## 2021-03-04 ENCOUNTER — Other Ambulatory Visit: Payer: Self-pay | Admitting: Family Medicine

## 2021-03-04 DIAGNOSIS — N289 Disorder of kidney and ureter, unspecified: Secondary | ICD-10-CM

## 2021-03-05 NOTE — Telephone Encounter (Signed)
Ok to refill??  Last office visit 09/07/2020.  Last refill 02/19/2021.

## 2021-03-09 ENCOUNTER — Other Ambulatory Visit: Payer: Self-pay | Admitting: *Deleted

## 2021-03-09 DIAGNOSIS — F320 Major depressive disorder, single episode, mild: Secondary | ICD-10-CM

## 2021-03-09 DIAGNOSIS — F419 Anxiety disorder, unspecified: Secondary | ICD-10-CM

## 2021-03-09 MED ORDER — DULOXETINE HCL 60 MG PO CPEP
60.0000 mg | ORAL_CAPSULE | Freq: Every day | ORAL | 3 refills | Status: DC
Start: 2021-03-09 — End: 2022-03-22

## 2021-03-18 ENCOUNTER — Other Ambulatory Visit: Payer: Self-pay | Admitting: Family Medicine

## 2021-03-18 DIAGNOSIS — N289 Disorder of kidney and ureter, unspecified: Secondary | ICD-10-CM

## 2021-03-19 ENCOUNTER — Other Ambulatory Visit: Payer: Self-pay | Admitting: Family Medicine

## 2021-03-19 DIAGNOSIS — F419 Anxiety disorder, unspecified: Secondary | ICD-10-CM

## 2021-03-19 NOTE — Telephone Encounter (Signed)
Ok to refill??  Last office visit 09/07/2020.  Last refill 03/05/2021.  Of note, patient is requesting refill Q 2 weeks. Do you want to bring her in to discuss?

## 2021-03-20 NOTE — Telephone Encounter (Signed)
Ok to refill??  Last office visit 09/07/2020.  Last refill 11/21/2020, #3 refills.

## 2021-03-29 ENCOUNTER — Other Ambulatory Visit: Payer: Self-pay | Admitting: Family Medicine

## 2021-03-29 DIAGNOSIS — N289 Disorder of kidney and ureter, unspecified: Secondary | ICD-10-CM

## 2021-03-30 ENCOUNTER — Other Ambulatory Visit: Payer: Self-pay | Admitting: *Deleted

## 2021-03-30 MED ORDER — ATENOLOL 25 MG PO TABS: 25.0000 mg | ORAL_TABLET | Freq: Every day | ORAL | 0 refills | Status: DC

## 2021-03-30 NOTE — Telephone Encounter (Signed)
Refill denied 

## 2021-04-01 ENCOUNTER — Other Ambulatory Visit: Payer: Self-pay | Admitting: Family Medicine

## 2021-04-01 DIAGNOSIS — N289 Disorder of kidney and ureter, unspecified: Secondary | ICD-10-CM

## 2021-04-03 ENCOUNTER — Other Ambulatory Visit: Payer: Self-pay | Admitting: Family Medicine

## 2021-04-03 ENCOUNTER — Telehealth: Payer: Self-pay | Admitting: Family Medicine

## 2021-04-03 DIAGNOSIS — N289 Disorder of kidney and ureter, unspecified: Secondary | ICD-10-CM

## 2021-04-03 NOTE — Telephone Encounter (Signed)
Patient called to follow up on refill request sent by pharmacy for traMADol (ULTRAM) 50 MG tablet [[id number]]   Patient took last dose over the weekend.  Pharmacy confirmed as  [facility] [id number] [place], [place] - [place]  [address] [place] [address]  Phone:  [phone]  Fax:  [phone]   Please advise at [phone].

## 2021-04-03 NOTE — Telephone Encounter (Signed)
Patient returned call and made aware.   States that she will call back to schedule OV if pain is worsened.

## 2021-04-03 NOTE — Telephone Encounter (Signed)
Patient has been requesting refill Q 2 weeks.   Prescription should last for 30 days.   Refill sent to pharmacy on 03/19/2021.  Refills denied as refill too soon.   Call placed to patient to advise. [place].

## 2021-04-05 ENCOUNTER — Other Ambulatory Visit: Payer: Self-pay | Admitting: Family Medicine

## 2021-04-05 DIAGNOSIS — N289 Disorder of kidney and ureter, unspecified: Secondary | ICD-10-CM

## 2021-04-06 ENCOUNTER — Ambulatory Visit (INDEPENDENT_AMBULATORY_CARE_PROVIDER_SITE_OTHER): Payer: 59 | Admitting: Family Medicine

## 2021-04-06 ENCOUNTER — Other Ambulatory Visit: Payer: Self-pay

## 2021-04-06 VITALS — BP 118/64 | Temp 97.3°F | Ht 64.0 in | Wt 157.0 lb

## 2021-04-06 DIAGNOSIS — M25562 Pain in left knee: Secondary | ICD-10-CM

## 2021-04-06 DIAGNOSIS — M25561 Pain in right knee: Secondary | ICD-10-CM

## 2021-04-06 DIAGNOSIS — G8929 Other chronic pain: Secondary | ICD-10-CM | POA: Diagnosis not present

## 2021-04-06 MED ORDER — TRAMADOL HCL 100 MG PO TABS: 100.0000 mg | ORAL_TABLET | Freq: Two times a day (BID) | ORAL | 0 refills | Status: DC

## 2021-04-06 NOTE — Progress Notes (Signed)
Subjective:    Patient ID: Erika Harvey, female    DOB: November 04, 1960, 60 y.o.   MRN: [id number]  Knee Pain  03/2020 Patient is here today for bilateral knee pain.  She has a history of 2 previous left knee surgeries.  She has soft tissue swelling anterior and slightly inferior to the patella.  Is extremely tender to palpation in that area.  She states that that is chronic.  The swelling is over and inferior to the lateral joint line.  She also reports pain in the right knee.  The pain in both knees is worse going up and down steps, rising from a seated position, walking.  She reports crepitus in both knees.  She is requesting a cortisone injection.  At that time, my plan was: Patient has bilateral knee pain.  This is a chronic issue for her and she states that she has had it for years.  I suspect it is due to osteoarthritis.  She has seen orthopedics in the past and they have performed x-rays.  She has had 2 arthroscopic surgeries on her left knee.  She is requesting a cortisone injection.  Using sterile technique, I injected the right knee with a mixture of 2 cc lidocaine, 2 cc of Marcaine, and 2 cc of 40 mg/mL Kenalog.  Then using sterile technique I injected the left knee with the same mixture.  The patient tolerated the procedure well without complications.  09/07/20 Patient returns requesting cortisone injections in both knees.  She states that the first cortisone injections lasted until mid January.  She reports pain and pressure in both knees with walking.  There is still a soft tissue accumulation anterior to the left knee as shown in the photograph below.  This is not an effusion.  It spongy and fatty in nature.  Is also tender to the touch.  She reports pain going up and down stairs.  04/06/21 Patient states that the tramadol 50 mg tablets are no longer working.  She states that she has to take 2 at a time before she sees any relief.  She typically takes 2 in the morning and then would need  to take another 2 in the evening after she has been on her feet all day long.  Essentially if she could take 100 mg twice a day she feels that her knee pain will be better controlled.  She has a history of NSAID induced nephropathy.  Her creatinine was elevated and got up to 1.4 due to overuse of NSAIDs.  Since that time she is refrain from using all NSAIDs.  However she is dependent upon tramadol now for knee pain.  She continues to complain of pain in the left knee.  An x-ray in March revealed mild osteoarthritis but otherwise no significant abnormal findings.  She does have a large soft tissue accumulation lateral to the patella on the left knee that is tender to the touch.  She states that this is scar tissue.  She is tender to palpation in that area and she does have palpable crepitus with range of motion.  She is also complaining of pain in her right knee.  There is no significant erythema or effusion in the right knee.  However she does complain of pain over the medial and lateral joint line with ambulation Past Medical History:  Diagnosis Date   Allergy    Anxiety    Chronic low back pain    Fibroid 03/30/2015   Frequent UTI  Hypertension    Vaginal bleeding 03/30/2015   Past Surgical History:  Procedure Laterality Date   BACK SURGERY  05-16-2015   KNEE SURGERY     left   KNEE SURGERY Left 07/08/2012   WISDOM TOOTH EXTRACTION     Current Outpatient Medications on File Prior to Visit  Medication Sig Dispense Refill   atenolol (TENORMIN) 25 MG tablet Take 1 tablet (25 mg total) by mouth daily. 90 tablet 0   atorvastatin (LIPITOR) 40 MG tablet Take 1 tablet (40 mg total) by mouth daily. 90 tablet 1   calcium-vitamin D (OSCAL WITH D) 250-125 MG-UNIT per tablet Take 1 tablet by mouth daily.     cyclobenzaprine (FLEXERIL) 10 MG tablet TAKE ONE TABLET BY MOUTH THREE TIMES A DAY AS NEEDED FOR MUSCLE SPASMS 60 tablet 2   diazepam (VALIUM) 5 MG tablet TAKE ONE TABLET BY MOUTH TWICE A DAY AS  NEEDED FOR ANXIETY 60 tablet 3   diclofenac Sodium (VOLTAREN) 1 % GEL Apply 2 g topically 4 (four) times daily. 100 g 5   DULoxetine (CYMBALTA) 60 MG capsule Take 1 capsule (60 mg total) by mouth daily. 90 capsule 3   fish oil-omega-3 fatty acids 1000 MG capsule Take 2 g by mouth 2 (two) times daily.     losartan (COZAAR) 100 MG tablet Take 1 tablet (100 mg total) by mouth daily. 90 tablet 2   omeprazole (PRILOSEC) 20 MG capsule Take 1 capsule (20 mg total) by mouth daily. 90 capsule 3   traMADol (ULTRAM) 50 MG tablet TAKE ONE TABLET BY MOUTH EVERY 8 HOURS AS NEEDED 30 tablet 0   zolpidem (AMBIEN CR) 12.5 MG CR tablet Take 1 tablet (12.5 mg total) by mouth at bedtime as needed for sleep. 30 tablet 2   No current facility-administered medications on file prior to visit.   Allergies  Allergen Reactions   Norvasc [Amlodipine Besylate] Swelling and Rash   Social History   Socioeconomic History   Marital status: Married    Spouse name: Not on file   Number of children: Not on file   Years of education: Not on file   Highest education level: Not on file  Occupational History   Not on file  Tobacco Use   Smoking status: Never   Smokeless tobacco: Never  Substance and Sexual Activity   Alcohol use: Yes    Alcohol/week: 5.0 standard drinks    Types: 5 Cans of beer per week   Drug use: No   Sexual activity: Yes    Birth control/protection: Pill  Other Topics Concern   Not on file  Social History Narrative   Not on file   Social Determinants of Health   Financial Resource Strain: Not on file  Food Insecurity: Not on file  Transportation Needs: Not on file  Physical Activity: Not on file  Stress: Not on file  Social Connections: Not on file  Intimate Partner Violence: Not on file     Review of Systems  All other systems reviewed and are negative.     Objective:   Physical Exam Vitals reviewed.  Constitutional:      Appearance: Normal appearance.  Cardiovascular:      Rate and Rhythm: Normal rate and regular rhythm.     Heart sounds: Normal heart sounds.  Pulmonary:     Effort: Pulmonary effort is normal. No respiratory distress.     Breath sounds: Normal breath sounds. No stridor. No wheezing, rhonchi or rales.  Chest:  Chest wall: No tenderness.  Abdominal:     General: Abdomen is flat. Bowel sounds are normal.     Palpations: Abdomen is soft.  Musculoskeletal:     Right knee: Bony tenderness and crepitus present. Decreased range of motion. No LCL laxity, MCL laxity, ACL laxity or PCL laxity.     Left knee: Bony tenderness and crepitus present. Decreased range of motion. No LCL laxity, MCL laxity, ACL laxity or PCL laxity. Neurological:     Mental Status: She is alert.     Soft tissue swelling anterior to knee.       Assessment & Plan:  Chronic pain of left knee - Plan: COMPLETE METABOLIC PANEL WITH GFR, CBC with Differential/Platelet  Bilateral chronic knee pain Using sterile technique, I injected the right knee with 2 cc lidocaine, 2 cc of Marcaine, and 2 cc of 40 mg/mL Kenalog.  Then using sterile technique I injected the left knee with 2 cc lidocaine, 2 cc of Marcaine, and 2 cc of 40 mg/mL Kenalog.  X-ray did not show significant osteoarthritis in the left knee.  I do believe the patient likely has scar tissue in the left knee from her previous surgeries and likely has pain related to that.  Also believe she is likely developing arthritis in her right knee.  She does not want to have any additional surgery.  Instead, we together decided to increase tramadol to 100 mg twice a day 60 tablets/month.

## 2021-04-07 LAB — CBC WITH DIFFERENTIAL/PLATELET
Absolute Monocytes: 318 cells/uL (ref 200–950)
Basophils Absolute: 30 cells/uL (ref 0–200)
Basophils Relative: 0.5 %
Eosinophils Absolute: 102 cells/uL (ref 15–500)
Eosinophils Relative: 1.7 %
HCT: 35.1 % (ref 35.0–45.0)
Hemoglobin: 11.8 g/dL (ref 11.7–15.5)
Lymphs Abs: 1704 cells/uL (ref 850–3900)
MCH: 33 pg (ref 27.0–33.0)
MCHC: 33.6 g/dL (ref 32.0–36.0)
MCV: 98 fL (ref 80.0–100.0)
MPV: 9.8 fL (ref 7.5–12.5)
Monocytes Relative: 5.3 %
Neutro Abs: 3846 cells/uL (ref 1500–7800)
Neutrophils Relative %: 64.1 %
Platelets: 290 10*3/uL (ref 140–400)
RBC: 3.58 10*6/uL — ABNORMAL LOW (ref 3.80–5.10)
RDW: 11.5 % (ref 11.0–15.0)
Total Lymphocyte: 28.4 %
WBC: 6 10*3/uL (ref 3.8–10.8)

## 2021-04-07 LAB — COMPLETE METABOLIC PANEL WITH GFR
AG Ratio: 1.8 (calc) (ref 1.0–2.5)
ALT: 78 U/L — ABNORMAL HIGH (ref 6–29)
AST: 55 U/L — ABNORMAL HIGH (ref 10–35)
Albumin: 4.4 g/dL (ref 3.6–5.1)
Alkaline phosphatase (APISO): 181 U/L — ABNORMAL HIGH (ref 37–153)
BUN: 21 mg/dL (ref 7–25)
CO2: 26 mmol/L (ref 20–32)
Calcium: 9.8 mg/dL (ref 8.6–10.4)
Chloride: 103 mmol/L (ref 98–110)
Creat: 0.89 mg/dL (ref 0.50–1.05)
Globulin: 2.4 g/dL (calc) (ref 1.9–3.7)
Glucose, Bld: 98 mg/dL (ref 65–99)
Potassium: 4.7 mmol/L (ref 3.5–5.3)
Sodium: 139 mmol/L (ref 135–146)
Total Bilirubin: 0.3 mg/dL (ref 0.2–1.2)
Total Protein: 6.8 g/dL (ref 6.1–8.1)
eGFR: 74 mL/min/{1.73_m2} (ref >=60)

## 2021-04-13 ENCOUNTER — Other Ambulatory Visit: Payer: Self-pay | Admitting: *Deleted

## 2021-04-13 DIAGNOSIS — R7989 Other specified abnormal findings of blood chemistry: Secondary | ICD-10-CM

## 2021-04-24 ENCOUNTER — Telehealth: Payer: Self-pay | Admitting: Family Medicine

## 2021-04-24 NOTE — Telephone Encounter (Signed)
Patient received call about scheduling a mammogram. Patient states appt already scheduled.   Patient also disputing call received about scheduling US Abdomen Limited RUQ (LIVER/GB); unsure about why it was ordered.   Please advise at [phone].

## 2021-04-25 NOTE — Telephone Encounter (Signed)
[facility] does not call to schedule mammogram. This is an Epic call.   [place] was ordered due to liver inflammation and elevated LFT's.   [name], MD  04/09/2021  7:09 AM EDT     Liver is inflamed on blood work.  Proceed with right upper quadrant ultrasound and viral hepatitis serologies for hepatitis B and C   Call placed to patient. [place].

## 2021-04-27 NOTE — Telephone Encounter (Signed)
Call placed to patient and patient made aware.  

## 2021-04-27 NOTE — Telephone Encounter (Signed)
Call placed to patient. LMTRC.  

## 2021-04-30 ENCOUNTER — Telehealth: Payer: Self-pay | Admitting: Family Medicine

## 2021-04-30 NOTE — Telephone Encounter (Signed)
Old message left for patient.   Have discussed plan of care at this time.

## 2021-04-30 NOTE — Telephone Encounter (Signed)
Patient left message on voicemail; returning call to [place]. Please advise at [phone].

## 2021-05-09 ENCOUNTER — Telehealth: Payer: Self-pay | Admitting: Family Medicine

## 2021-05-09 NOTE — Telephone Encounter (Signed)
Patient called to request refill of traMADol   Quantity; 120 tablets, 50MG   Patient took last dose 10/31.  Pharmacy confirmed as   [id number] [place], [place] - [place]  [address] [place] [address]  Phone:  [phone]  Fax:  [phone]   Please advise at [phone]

## 2021-05-09 NOTE — Telephone Encounter (Signed)
LOV 04/06/21 Last refill 04/06/21, #60, 0 refills  Please advise. Thank you!

## 2021-05-10 ENCOUNTER — Other Ambulatory Visit: Payer: Self-pay | Admitting: Family Medicine

## 2021-05-10 MED ORDER — TRAMADOL HCL 100 MG PO TABS: 100.0000 mg | ORAL_TABLET | Freq: Two times a day (BID) | ORAL | 0 refills | Status: DC

## 2021-05-17 ENCOUNTER — Other Ambulatory Visit: Payer: Self-pay | Admitting: *Deleted

## 2021-05-17 NOTE — Telephone Encounter (Signed)
Received fax requesting refill on Flexeril.   Ok to refill? 

## 2021-05-18 MED ORDER — CYCLOBENZAPRINE HCL 10 MG PO TABS: ORAL_TABLET | ORAL | 2 refills | Status: DC

## 2021-06-06 ENCOUNTER — Other Ambulatory Visit: Payer: Self-pay | Admitting: Obstetrics and Gynecology

## 2021-06-06 DIAGNOSIS — R928 Other abnormal and inconclusive findings on diagnostic imaging of breast: Secondary | ICD-10-CM

## 2021-06-08 ENCOUNTER — Other Ambulatory Visit: Payer: Self-pay | Admitting: Family Medicine

## 2021-06-08 ENCOUNTER — Other Ambulatory Visit: Payer: Self-pay

## 2021-06-08 ENCOUNTER — Telehealth: Payer: Self-pay | Admitting: Family Medicine

## 2021-06-08 MED ORDER — TRAMADOL HCL 100 MG PO TABS: 100.0000 mg | ORAL_TABLET | Freq: Two times a day (BID) | ORAL | 0 refills | Status: DC

## 2021-06-08 NOTE — Telephone Encounter (Signed)
Patient called to request refill of traMADol HCl 100 MG TABS [[id number]]   Pharmacy confirmed as  [facility] [id number] [place], [place] - [address]  [address] [address]  Phone:  [phone]  Fax:  [phone]   Please advise at [phone]

## 2021-06-13 ENCOUNTER — Other Ambulatory Visit: Payer: Self-pay | Admitting: Family Medicine

## 2021-06-13 DIAGNOSIS — G4700 Insomnia, unspecified: Secondary | ICD-10-CM

## 2021-07-03 MED ORDER — ATENOLOL 25 MG PO TABS: 25.0000 mg | ORAL_TABLET | Freq: Every day | ORAL | 2 refills | Status: DC

## 2021-07-03 NOTE — Addendum Note (Signed)
Addended by: Jaynie Crumble on: 07/03/2021 12:49 PM   Modules accepted: Orders

## 2021-07-06 ENCOUNTER — Other Ambulatory Visit: Payer: Self-pay | Admitting: Obstetrics and Gynecology

## 2021-07-06 ENCOUNTER — Ambulatory Visit
Admission: RE | Admit: 2021-07-06 | Discharge: 2021-07-06 | Disposition: A | Discharge status: Home / Self Care | Payer: 59 | Source: Ambulatory Visit | Attending: Obstetrics and Gynecology | Admitting: Obstetrics and Gynecology

## 2021-07-06 ENCOUNTER — Other Ambulatory Visit: Payer: Self-pay | Admitting: Family Medicine

## 2021-07-06 ENCOUNTER — Telehealth: Payer: Self-pay

## 2021-07-06 DIAGNOSIS — R928 Other abnormal and inconclusive findings on diagnostic imaging of breast: Secondary | ICD-10-CM

## 2021-07-06 DIAGNOSIS — N6320 Unspecified lump in the left breast, unspecified quadrant: Secondary | ICD-10-CM

## 2021-07-06 MED ORDER — TRAMADOL HCL 100 MG PO TABS: 100.0000 mg | ORAL_TABLET | Freq: Two times a day (BID) | ORAL | 0 refills | Status: DC

## 2021-07-06 NOTE — Telephone Encounter (Signed)
Pt called in requesting refill of atenolol (TENORMIN) 25 MG tablet . And pt stated that she fell flat on back yesterday and is in some pain and would like to also get a refill of traMADol HCl 100 MG TABS.   Cb#: [phone]

## 2021-07-10 ENCOUNTER — Other Ambulatory Visit: Payer: Self-pay | Admitting: Family Medicine

## 2021-07-10 MED ORDER — TRAMADOL HCL 50 MG PO TABS: 100.0000 mg | ORAL_TABLET | Freq: Two times a day (BID) | ORAL | 0 refills | Status: DC

## 2021-07-10 NOTE — Telephone Encounter (Signed)
Pharmacy says they do not have tramadol 100mg  tablets.  Please send in new rx for 50mg  tablets if appropriate.

## 2021-07-12 ENCOUNTER — Other Ambulatory Visit: Payer: Self-pay | Admitting: Family Medicine

## 2021-07-12 DIAGNOSIS — F419 Anxiety disorder, unspecified: Secondary | ICD-10-CM

## 2021-07-13 ENCOUNTER — Other Ambulatory Visit: Payer: Self-pay

## 2021-07-13 MED ORDER — ATORVASTATIN CALCIUM 40 MG PO TABS: 40.0000 mg | ORAL_TABLET | Freq: Every day | ORAL | 1 refills | Status: DC

## 2021-07-19 ENCOUNTER — Ambulatory Visit
Admission: RE | Admit: 2021-07-19 | Discharge: 2021-07-19 | Disposition: A | Discharge status: Home / Self Care | Payer: 59 | Source: Ambulatory Visit | Attending: Obstetrics and Gynecology | Admitting: Obstetrics and Gynecology

## 2021-07-19 DIAGNOSIS — C50412 Malignant neoplasm of upper-outer quadrant of left female breast: Secondary | ICD-10-CM | POA: Diagnosis not present

## 2021-07-19 DIAGNOSIS — N6321 Unspecified lump in the left breast, upper outer quadrant: Secondary | ICD-10-CM | POA: Diagnosis not present

## 2021-07-19 DIAGNOSIS — N6320 Unspecified lump in the left breast, unspecified quadrant: Secondary | ICD-10-CM

## 2021-07-19 DIAGNOSIS — C50919 Malignant neoplasm of unspecified site of unspecified female breast: Secondary | ICD-10-CM

## 2021-07-19 HISTORY — PX: BREAST BIOPSY: SHX20

## 2021-07-19 HISTORY — DX: Malignant neoplasm of unspecified site of unspecified female breast: C50.919

## 2021-07-23 ENCOUNTER — Other Ambulatory Visit: Payer: Self-pay | Admitting: Surgery

## 2021-07-23 DIAGNOSIS — Z853 Personal history of malignant neoplasm of breast: Secondary | ICD-10-CM

## 2021-07-23 DIAGNOSIS — C50912 Malignant neoplasm of unspecified site of left female breast: Secondary | ICD-10-CM | POA: Diagnosis not present

## 2021-07-24 ENCOUNTER — Other Ambulatory Visit: Payer: Self-pay | Admitting: Surgery

## 2021-07-24 DIAGNOSIS — Z853 Personal history of malignant neoplasm of breast: Secondary | ICD-10-CM

## 2021-07-25 ENCOUNTER — Encounter: Payer: Self-pay | Admitting: Adult Health

## 2021-07-25 DIAGNOSIS — Z170 Estrogen receptor positive status [ER+]: Secondary | ICD-10-CM | POA: Insufficient documentation

## 2021-07-25 DIAGNOSIS — C50412 Malignant neoplasm of upper-outer quadrant of left female breast: Secondary | ICD-10-CM | POA: Insufficient documentation

## 2021-07-26 ENCOUNTER — Other Ambulatory Visit: Payer: Self-pay | Admitting: *Deleted

## 2021-07-26 ENCOUNTER — Telehealth: Payer: Self-pay | Admitting: Hematology and Oncology

## 2021-07-26 DIAGNOSIS — C50912 Malignant neoplasm of unspecified site of left female breast: Secondary | ICD-10-CM

## 2021-07-26 NOTE — Telephone Encounter (Signed)
Scheduled appt per 1/19 referral. Called pt, no answer and vm was full, unable to leave a msg. Will try to contact pt again later today.

## 2021-07-27 ENCOUNTER — Telehealth: Payer: Self-pay | Admitting: Hematology and Oncology

## 2021-07-27 NOTE — Telephone Encounter (Signed)
Scheduled appt on 1/24 with Dr. Chryl Heck. Spoke to pt today, she is aware of appt date and time.

## 2021-07-30 ENCOUNTER — Encounter (HOSPITAL_BASED_OUTPATIENT_CLINIC_OR_DEPARTMENT_OTHER): Payer: Self-pay | Admitting: Surgery

## 2021-07-30 ENCOUNTER — Other Ambulatory Visit: Payer: Self-pay

## 2021-07-30 NOTE — Progress Notes (Signed)
New Breast Cancer Diagnosis: Left Breast UOQ  Did patient present with symptoms (if so, please note symptoms) or screening mammography?:Screening Mass    Location and Extent of disease :left breast. Located at 2:30 position, measured  6 mm in greatest dimension. Adenopathy no.  Histology per Pathology Report: grade 2, Invasive Lobular Carcinoma with associated LCIS 07/19/2021   Receptor Status: ER(positive), PR (positive), Her2-neu (negative), Ki-(10%)  Surgeon and surgical plan, if any: Dr. Ninfa Linden -Left Breast Lumpectomy with radioactive seed and SLN biopsy 08/08/2021   Medical oncologist, treatment if any:   Dr. Chryl Heck 07/31/2021 2:00   Family History of Breast/Ovarian/Prostate Cancer: Mom had breast cancer  Lymphedema issues, if any: No     Pain issues, if any: No    SAFETY ISSUES: Prior radiation? No Pacemaker/ICD? No Possible current pregnancy? Postmenopausal Is the patient on methotrexate? No  Current Complaints / other details:

## 2021-07-30 NOTE — Progress Notes (Addendum)
Radiation Oncology         [phone] ________________________________  Name: Erika Harvey        MRN: [id number]  Date of Service: 07/31/2021 DOB: [birth year]  [id number], Cammie Mcgee, MD  Coralie Keens, MD     REFERRING PHYSICIAN: Coralie Keens, MD   DIAGNOSIS: The encounter diagnosis was Malignant neoplasm of upper-outer quadrant of left breast in female, estrogen receptor positive ([place]).   HISTORY OF PRESENT ILLNESS: Erika Harvey is a 61 y.o. female seen iat the request of Dr. Ninfa Linden for a newly diagnosed left breast cancer. The patient was found to have a screening detected mass in the left breast in the upper outer quadrant at 2:30 measuring up to 6 mm and no left axillary adenopathy. 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 She's scheduled to undergo left lumpectomy and sentinel node biopsy on 08/08/21. She's seen today to discuss treatment recommendations for her cancer.     PREVIOUS RADIATION THERAPY: No   PAST MEDICAL HISTORY:  Past Medical History:  Diagnosis Date   Allergy    Anxiety    Chronic low back pain    Fibroid 03/30/2015   Frequent UTI    Hypertension    Vaginal bleeding 03/30/2015       PAST SURGICAL HISTORY: Past Surgical History:  Procedure Laterality Date   BACK SURGERY  05-16-2015   KNEE SURGERY     left   KNEE SURGERY Left 07/08/2012   WISDOM TOOTH EXTRACTION       FAMILY HISTORY:  Family History  Problem Relation Age of Onset   Cancer Mother 34       Breast Cancer   Heart disease Father 16   Diabetes Father    Colon cancer Neg Hx    Colon polyps Neg Hx      SOCIAL HISTORY:  reports that she has never smoked. She has never used smokeless tobacco. She reports current alcohol use of about 5.0 standard drinks per week. She reports that she does not use drugs. She is married and lives in  [place]. She is retired and enjoys time at home with her husband and their dogs. She does not have any children, and is an only child.   ALLERGIES: Norvasc [amlodipine besylate]   MEDICATIONS:  Current Outpatient Medications  Medication Sig Dispense Refill   atenolol (TENORMIN) 25 MG tablet Take 1 tablet (25 mg total) by mouth daily. 90 tablet 2   atorvastatin (LIPITOR) 40 MG tablet Take 1 tablet (40 mg total) by mouth daily. 90 tablet 1   calcium-vitamin D (OSCAL WITH D) 250-125 MG-UNIT per tablet Take 1 tablet by mouth daily.     cyclobenzaprine (FLEXERIL) 10 MG tablet TAKE ONE TABLET BY MOUTH THREE TIMES A DAY AS NEEDED FOR MUSCLE SPASMS 60 tablet 2   diazepam (VALIUM) 5 MG tablet TAKE ONE TABLET BY MOUTH TWICE A DAY AS NEEDED FOR ANXIETY 60 tablet 0   diclofenac Sodium (VOLTAREN) 1 % GEL Apply 2 g topically 4 (four) times daily. 100 g 5   DULoxetine (CYMBALTA) 60 MG capsule Take 1 capsule (60 mg total) by mouth daily. 90 capsule 3   estradiol (ESTRACE) 0.1 MG/GM vaginal cream estradiol 0.01% (0.1 mg/gram) vaginal cream  INSERT 1 ML VAGINALLY TWICE A WEEK     fish oil-omega-3 fatty acids 1000 MG capsule Take  2 g by mouth 2 (two) times daily.     losartan (COZAAR) 100 MG tablet Take 1 tablet (100 mg total) by mouth daily. 90 tablet 2   omeprazole (PRILOSEC) 20 MG capsule Take 1 capsule (20 mg total) by mouth daily. 90 capsule 3   traMADol (ULTRAM) 50 MG tablet Take 2 tablets (100 mg total) by mouth 2 (two) times daily. 120 tablet 0   zolpidem (AMBIEN CR) 12.5 MG CR tablet TAKE ONE TABLET BY MOUTH EVERY NIGHT AT BEDTIME AS NEEDED FOR SLEEP 30 tablet 3   No current facility-administered medications for this visit.     REVIEW OF SYSTEMS: On review of systems, the patient reports that she is doing well overall. She has been upset about her diagnosis given her mother's death from metastatic breast cancer when she was 41. She reports some itching of her breast and soreness since her  biopsy. No other complaints are verbalized.      PHYSICAL EXAM:  Wt Readings from Last 3 Encounters:  04/06/21 157 lb (71.2 kg)  09/07/20 156 lb (70.8 kg)  04/04/20 160 lb (72.6 kg)   Temp Readings from Last 3 Encounters:  04/06/21 (!) 97.3 F (36.3 C)  09/07/20 97.9 F (36.6 C) (Temporal)  04/04/20 98.4 F (36.9 C) (Oral)   BP Readings from Last 3 Encounters:  04/06/21 118/64  09/07/20 122/62  04/04/20 120/80   Pulse Readings from Last 3 Encounters:  09/07/20 82  04/04/20 92  07/08/19 78    In general this is a well appearing caucasian female in no acute distress. She's alert and oriented x4 and appropriate throughout the examination. Cardiopulmonary assessment is negative for acute distress and she exhibits normal effort. Bilateral breast exam is deferred.    ECOG = 1  0 - Asymptomatic (Fully active, able to carry on all predisease activities without restriction)  1 - Symptomatic but completely ambulatory (Restricted in physically strenuous activity but ambulatory and able to carry out work of a light or sedentary nature. For example, light housework, office work)  2 - Symptomatic, <50% in bed during the day (Ambulatory and capable of all self care but unable to carry out any work activities. Up and about more than 50% of waking hours)  3 - Symptomatic, >50% in bed, but not bedbound (Capable of only limited self-care, confined to bed or chair 50% or more of waking hours)  4 - Bedbound (Completely disabled. Cannot carry on any self-care. Totally confined to bed or chair)  5 - Death   Eustace Pen MM, Creech RH, Tormey DC, et al. [phone]). "Toxicity and response criteria of the [facility]". [place] Oncol. 5 (6): 649-55    LABORATORY DATA:  Lab Results  Component Value Date   WBC 6.0 04/06/2021   HGB 11.8 04/06/2021   HCT 35.1 04/06/2021   MCV 98.0 04/06/2021   PLT 290 04/06/2021   Lab Results  Component Value Date   NA 139 04/06/2021    K 4.7 04/06/2021   CL 103 04/06/2021   CO2 26 04/06/2021   Lab Results  Component Value Date   ALT 78 (H) 04/06/2021   AST 55 (H) 04/06/2021   ALKPHOS 56 10/24/2015   BILITOT 0.3 04/06/2021      RADIOGRAPHY: US BREAST LTD UNI LEFT INC AXILLA  Result Date: 07/06/2021 CLINICAL DATA:  61 year old female presenting as a recall from screening for possible left breast mass. EXAM: DIGITAL DIAGNOSTIC UNILATERAL LEFT MAMMOGRAM WITH TOMOSYNTHESIS AND CAD;  ULTRASOUND LEFT BREAST LIMITED TECHNIQUE: Left digital diagnostic mammography and breast tomosynthesis was performed. The images were evaluated with computer-aided detection.; Targeted ultrasound examination of the left breast was performed. COMPARISON:  Previous exam(s). ACR Breast Density Category b: There are scattered areas of fibroglandular density. FINDINGS: Mammogram: Spot compression tomosynthesis views of the left breast were performed demonstrating persistence of a subcentimeter irregular mass in the outer left breast. Ultrasound: Targeted ultrasound performed in the left breast at 2:30 o'clock 6 cm from the nipple demonstrating an irregular hypoechoic mass measuring 0.6 x 0.4 x 0.4 cm. This corresponds to the mammographic finding. Targeted ultrasound the left axilla demonstrates normal lymph nodes. IMPRESSION: Suspicious mass in the left breast at 2 o'clock measuring 0.6 cm. RECOMMENDATION: Ultrasound-guided core needle biopsy of the left breast mass. I have discussed the findings and recommendations with the patient who agrees to proceed with biopsy. The patient will be scheduled for the biopsy appointment prior to leaving the office today. BI-RADS CATEGORY  4: Suspicious. Electronically Signed   By: Audie Pinto M.D.   On: 07/06/2021 15:18  MM DIAG BREAST TOMO UNI LEFT  Result Date: 07/06/2021 CLINICAL DATA:  61 year old female presenting as a recall from screening for possible left breast mass. EXAM: DIGITAL DIAGNOSTIC UNILATERAL LEFT  MAMMOGRAM WITH TOMOSYNTHESIS AND CAD; ULTRASOUND LEFT BREAST LIMITED TECHNIQUE: Left digital diagnostic mammography and breast tomosynthesis was performed. The images were evaluated with computer-aided detection.; Targeted ultrasound examination of the left breast was performed. COMPARISON:  Previous exam(s). ACR Breast Density Category b: There are scattered areas of fibroglandular density. FINDINGS: Mammogram: Spot compression tomosynthesis views of the left breast were performed demonstrating persistence of a subcentimeter irregular mass in the outer left breast. Ultrasound: Targeted ultrasound performed in the left breast at 2:30 o'clock 6 cm from the nipple demonstrating an irregular hypoechoic mass measuring 0.6 x 0.4 x 0.4 cm. This corresponds to the mammographic finding. Targeted ultrasound the left axilla demonstrates normal lymph nodes. IMPRESSION: Suspicious mass in the left breast at 2 o'clock measuring 0.6 cm. RECOMMENDATION: Ultrasound-guided core needle biopsy of the left breast mass. I have discussed the findings and recommendations with the patient who agrees to proceed with biopsy. The patient will be scheduled for the biopsy appointment prior to leaving the office today. BI-RADS CATEGORY  4: Suspicious. Electronically Signed   By: Audie Pinto M.D.   On: 07/06/2021 15:18  MM CLIP PLACEMENT LEFT  Result Date: 07/19/2021 CLINICAL DATA:  Patient status post ultrasound-guided biopsy left breast mass. EXAM: 3D DIAGNOSTIC LEFT MAMMOGRAM POST ULTRASOUND BIOPSY COMPARISON:  Previous exam(s). FINDINGS: 3D Mammographic images were obtained following ultrasound guided biopsy of left breast mass. The biopsy marking clip is in expected position at the site of biopsy. IMPRESSION: Appropriate positioning of the ribbon shaped biopsy marking clip at the site of biopsy in the upper-outer left breast. Final Assessment: Post Procedure Mammograms for Marker Placement Electronically Signed   By: Lovey Newcomer  M.D.   On: 07/19/2021 08:47  [place] LT BREAST BX W LOC DEV 1ST LESION IMG BX SPEC US GUIDE  Addendum Date: 07/27/2021   ADDENDUM REPORT: 07/27/2021 19:08 ADDENDUM: Pathology revealed GRADE II INVASIVE LOBULAR CARCINOMA, LOBULAR CARCINOMA IN SITU of the LEFT breast, 2:30 'clock, 6cmfn, upper outer, (ribbon clip). This was found to be concordant by Dr. Lovey Newcomer. Pathology results were discussed with the patient and her husband by telephone. The patient reported doing well after the biopsy with tenderness at the site. Post biopsy instructions and care  were reviewed and questions were answered. The patient was encouraged to call [facility] for any additional concerns. My direct phone number was provided. Surgical consultation has been arranged with Dr. Nedra Hai at [facility] on July 23, 2021. Recommendation for a bilateral breast MRI given the lobular histology. Pathology results reported by Terie Purser, RN on 07/24/2021. Electronically Signed   By: Lovey Newcomer M.D.   On: 07/27/2021 19:08   Result Date: 07/27/2021 CLINICAL DATA:  Patient with suspicious left breast mass. EXAM: ULTRASOUND GUIDED LEFT BREAST CORE NEEDLE BIOPSY COMPARISON:  Previous exam(s). PROCEDURE: I met with the patient and we discussed the procedure of ultrasound-guided biopsy, including benefits and alternatives. We discussed the high likelihood of a successful procedure. We discussed the risks of the procedure, including infection, bleeding, tissue injury, clip migration, and inadequate sampling. Informed written consent was given. The usual time-out protocol was performed immediately prior to the procedure. Lesion quadrant: Upper outer quadrant Using sterile technique and 1% Lidocaine as local anesthetic, under direct ultrasound visualization, a 14 gauge spring-loaded device was used to perform biopsy of left breast mass using a lateral approach. At the conclusion of the procedure ribbon  tissue marker clip was deployed into the biopsy cavity. Follow up 2 view mammogram was performed and dictated separately. IMPRESSION: Ultrasound guided biopsy of left breast mass. No apparent complications. Electronically Signed: By: Lovey Newcomer M.D. On: 07/19/2021 08:29       IMPRESSION/PLAN: 1. Stage IA, [id number], grade 2, ER/PR positive invasive lobular carcinoma of the left breast. Dr. Lisbeth Renshaw discusses the pathology findings and reviews the nature of early stage left breast disease. The consensus from the breast conference includes breast conservation with lumpectomy with  sentinel node biopsy. Depending on the size of the final tumor measurements rendered by pathology, the tumor may be tested for Oncotype Dx score to determine a role for systemic therapy. Provided that chemotherapy is not indicated, the patient's course would then be followed by external radiotherapy to the breast  to reduce risks of local recurrence followed by antiestrogen therapy. We discussed the risks, benefits, short, and long term effects of radiotherapy, as well as the curative intent, and the patient is interested in proceeding. Dr. Lisbeth Renshaw discusses the delivery and logistics of radiotherapy and anticipates a course of 4 or up to 6 1/2 weeks of radiotherapy to the left breast with deep inspiration breath hold technique. We will see her back a few weeks after surgery to discuss the simulation process and anticipate we starting radiotherapy about 4-6 weeks after surgery.  2. Possible genetic predisposition to malignancy. The patient is a candidate for genetic testing given her personal and family history. She was offered referral and she is going to think about her options before proceeding with a referral to genetics clinic .    In a visit lasting 60 minutes, greater than 50% of the time was spent face to face reviewing her case, as well as in preparation of, discussing, and coordinating the patient's care.  The above  documentation reflects my direct findings during this shared patient visit. Please see the separate note by Dr. Lisbeth Renshaw on this date for the remainder of the patient's plan of care.    Carola Rhine, [facility]    **Disclaimer: This note was dictated with voice recognition software. Similar sounding words can inadvertently be transcribed and this note may contain transcription errors which may not have been corrected upon publication of note.**

## 2021-07-31 ENCOUNTER — Encounter: Payer: Self-pay | Admitting: Radiation Oncology

## 2021-07-31 ENCOUNTER — Encounter: Payer: Self-pay | Admitting: Hematology and Oncology

## 2021-07-31 ENCOUNTER — Inpatient Hospital Stay: Payer: 59 | Attending: Hematology and Oncology | Admitting: Hematology and Oncology

## 2021-07-31 ENCOUNTER — Ambulatory Visit
Admission: RE | Admit: 2021-07-31 | Discharge: 2021-07-31 | Disposition: A | Discharge status: Home / Self Care | Payer: 59 | Source: Ambulatory Visit | Attending: Radiation Oncology | Admitting: Radiation Oncology

## 2021-07-31 VITALS — BP 107/77 | HR 93 | Temp 97.6°F | Resp 20 | Ht 63.0 in | Wt 147.6 lb

## 2021-07-31 DIAGNOSIS — C50412 Malignant neoplasm of upper-outer quadrant of left female breast: Secondary | ICD-10-CM | POA: Insufficient documentation

## 2021-07-31 DIAGNOSIS — Z79899 Other long term (current) drug therapy: Secondary | ICD-10-CM | POA: Insufficient documentation

## 2021-07-31 DIAGNOSIS — Z87440 Personal history of urinary (tract) infections: Secondary | ICD-10-CM | POA: Insufficient documentation

## 2021-07-31 DIAGNOSIS — M545 Low back pain, unspecified: Secondary | ICD-10-CM | POA: Diagnosis not present

## 2021-07-31 DIAGNOSIS — I10 Essential (primary) hypertension: Secondary | ICD-10-CM | POA: Insufficient documentation

## 2021-07-31 DIAGNOSIS — Z791 Long term (current) use of non-steroidal anti-inflammatories (NSAID): Secondary | ICD-10-CM | POA: Diagnosis not present

## 2021-07-31 DIAGNOSIS — Z170 Estrogen receptor positive status [ER+]: Secondary | ICD-10-CM | POA: Diagnosis not present

## 2021-07-31 DIAGNOSIS — Z803 Family history of malignant neoplasm of breast: Secondary | ICD-10-CM | POA: Diagnosis not present

## 2021-07-31 MED ORDER — ENSURE PRE-SURGERY PO LIQD: 296.0000 mL | Freq: Once | ORAL | Status: DC

## 2021-07-31 NOTE — Progress Notes (Signed)

## 2021-07-31 NOTE — Assessment & Plan Note (Addendum)
This is a very pleasant 61 year old postmenopausal female patient with newly diagnosed invasive lobular carcinoma of the left breast, T1 N0 M0 grade, ER/PR positive, ER strong staining intensity 100%, PR 40% positive, strong staining intensity, HER2 negative and KI of 10% scheduled for left lumpectomy by Dr. Ninfa Linden on August 08, 2021 who is here for adjuvant recommendations.  We have discussed about Oncotype Dx score which is a well validated prognostic scoring system which can predict outcome with endocrine therapy alone and whether chemotherapy reduces recurrence.  Given very small tumor and lobular subtype, I do not believe she will likely need any adjuvant chemotherapy.  We have however discussed about adjuvant antiestrogen therapy.  I have discussed about mechanism of action of tamoxifen and aromatase inhibitors, have discussed about adverse effects from tamoxifen which include but not limited to postmenopausal symptoms, increased risk of DVT/PE, increased risk of endometrial cancer, questionable increased risk of cardiovascular events and a benefit from tamoxifen would be important in bone density.  With regards to aromatase inhibitors, discussed about mechanism of action, adverse effects of Arimidex including but not limited to postmenopausal symptoms, bone loss, again increased risk of cardiovascular events, arthralgias etc.  Patient understands that antiestrogen therapy is recommended after completion of radiation for about 5 years.  Patient and husband do not want to proceed with Oncotype testing.  We have also discussed about genetic testing since her mom had breast cancer at a very young age.  At this time they want to defer genetic testing as well.  I do not believe she will have high risk for recurrence on Oncotype scoring based on her pathology report.  But since her tumor is greater than 5 mm, we have briefly discussed this and she wants to proceed with radiation after surgery followed by  adjuvant antiestrogen therapy.  She understands Oncotype may showed some prognostic information as well but at this time does not want to proceed with this.

## 2021-07-31 NOTE — Progress Notes (Addendum)
[place] CONSULT NOTE  Patient Care Team: [name], MD as PCP - General (Family Medicine) Coralie Keens, MD as Consulting Physician (General Surgery)  CHIEF COMPLAINTS/PURPOSE OF CONSULTATION:  Bena left breast  ASSESSMENT & PLAN:  Malignant neoplasm of upper-outer quadrant of left breast in female, estrogen receptor positive ([place]) This is a very pleasant 61 year old postmenopausal female patient with newly diagnosed invasive lobular carcinoma of the left breast, T1 N0 M0 grade, ER/PR positive, ER strong staining intensity 100%, PR 40% positive, strong staining intensity, HER2 negative and KI of 10% scheduled for left lumpectomy by Dr. Ninfa Linden on August 08, 2021 who is here for adjuvant recommendations.  We have discussed about Oncotype Dx score which is a well validated prognostic scoring system which can predict outcome with endocrine therapy alone and whether chemotherapy reduces recurrence.  Given very small tumor and lobular subtype, I do not believe she will likely need any adjuvant chemotherapy.  We have however discussed about adjuvant antiestrogen therapy.  I have discussed about mechanism of action of tamoxifen and aromatase inhibitors, have discussed about adverse effects from tamoxifen which include but not limited to postmenopausal symptoms, increased risk of DVT/PE, increased risk of endometrial cancer, questionable increased risk of cardiovascular events and a benefit from tamoxifen would be important in bone density.  With regards to aromatase inhibitors, discussed about mechanism of action, adverse effects of Arimidex including but not limited to postmenopausal symptoms, bone loss, again increased risk of cardiovascular events, arthralgias etc.  Patient understands that antiestrogen therapy is recommended after completion of radiation for about 5 years.  Patient and husband do not want to proceed with Oncotype testing.  We have also discussed about  genetic testing since her mom had breast cancer at a very young age.  At this time they want to defer genetic testing as well.  I do not believe she will have high risk for recurrence on Oncotype scoring based on her pathology report.  But since her tumor is greater than 5 mm, we have briefly discussed this and she wants to proceed with radiation after surgery followed by adjuvant antiestrogen therapy.  She understands Oncotype may showed some prognostic information as well but at this time does not want to proceed with this.  No orders of the defined types were placed in this encounter.  We discussed that there is minimal absorption of estrogen from vaginal estrogens which can lead to questionable increase in breast cancer.  HISTORY OF PRESENTING ILLNESS:  Erika Harvey 61 y.o. female is here because of invasive lobular carcinoma of the left breast.  This is a 61 year old female patient seen by Dr. Ninfa Linden for evaluation of breast cancer.  She had screening mammography which showed a 6 mm mass in the upper outer quadrant of left breast.  Biopsy showed an invasive lobular carcinoma with LCIS, grade 2.  Pathology showed ER 100%, positive, strong staining intensity, PR, 40% positive strong staining intensity, Ki of 10% and negative for HER2 1+ by IHC. She is scheduled for lumpectomy by Dr. Ninfa Linden on 08/08/2021. She is here to discuss adjuvant recommendations with medical oncology.  He arrived to the appointment today with her husband.  She denies any prior abnormal mammograms or need for biopsies.  Mother had breast cancer in her 71s and died from breast cancer in early 47s.  She took birth control for about 14 years, stopped them about 5 years ago.  She takes vaginal estrogen, Vagifem for vaginal dryness. She saw  Dr. Lisbeth Renshaw this morning for recommendations for adjuvant radiation. Rest of the pertinent 10 point ROS reviewed and negative.  REVIEW OF SYSTEMS:   Constitutional: Denies fevers,  chills or abnormal night sweats Eyes: Denies blurriness of vision, double vision or watery eyes Ears, nose, mouth, throat, and face: Denies mucositis or sore throat Respiratory: Denies cough, dyspnea or wheezes Cardiovascular: Denies palpitation, chest discomfort or lower extremity swelling Gastrointestinal:  Denies nausea, heartburn or change in bowel habits Skin: Denies abnormal skin rashes Lymphatics: Denies new lymphadenopathy or easy bruising Neurological:Denies numbness, tingling or new weaknesses Behavioral/Psych: Mood is stable, no new changes  All other systems were reviewed with the patient and are negative.  MEDICAL HISTORY:  Past Medical History:  Diagnosis Date   Allergy    Anxiety    Arthritis    Breast cancer ([place]) 07/19/2021   Chronic low back pain    Fibroid 03/30/2015   Frequent UTI    Hypertension    Vaginal bleeding 03/30/2015    SURGICAL HISTORY: Past Surgical History:  Procedure Laterality Date   BACK SURGERY  05-16-2015   KNEE SURGERY     left   KNEE SURGERY Left 07/08/2012   WISDOM TOOTH EXTRACTION      SOCIAL HISTORY: Social History   Socioeconomic History   Marital status: Married    Spouse name: Not on file   Number of children: Not on file   Years of education: Not on file   Highest education level: Not on file  Occupational History   Not on file  Tobacco Use   Smoking status: Never   Smokeless tobacco: Never  Vaping Use   Vaping Use: Never used  Substance and Sexual Activity   Alcohol use: Yes    Alcohol/week: 5.0 standard drinks    Types: 5 Cans of beer per week   Drug use: No   Sexual activity: Yes    Birth control/protection: Pill  Other Topics Concern   Not on file  Social History Narrative   Not on file   Social Determinants of Health   Financial Resource Strain: Not on file  Food Insecurity: Not on file  Transportation Needs: Not on file  Physical Activity: Not on file  Stress: Not on file  Social Connections: Not  on file  Intimate Partner Violence: Not on file    FAMILY HISTORY: Family History  Problem Relation Age of Onset   Breast cancer Mother    Heart disease Father 27   Diabetes Father    Colon cancer Neg Hx    Colon polyps Neg Hx     ALLERGIES:  is allergic to norvasc [amlodipine besylate].  MEDICATIONS:  Current Outpatient Medications  Medication Sig Dispense Refill   atenolol (TENORMIN) 25 MG tablet Take 1 tablet (25 mg total) by mouth daily. 90 tablet 2   atorvastatin (LIPITOR) 40 MG tablet Take 1 tablet (40 mg total) by mouth daily. 90 tablet 1   calcium-vitamin D (OSCAL WITH D) 250-125 MG-UNIT per tablet Take 1 tablet by mouth daily.     cyclobenzaprine (FLEXERIL) 10 MG tablet TAKE ONE TABLET BY MOUTH THREE TIMES A DAY AS NEEDED FOR MUSCLE SPASMS 60 tablet 2   diazepam (VALIUM) 5 MG tablet TAKE ONE TABLET BY MOUTH TWICE A DAY AS NEEDED FOR ANXIETY 60 tablet 0   diclofenac Sodium (VOLTAREN) 1 % GEL Apply 2 g topically 4 (four) times daily. 100 g 5   DULoxetine (CYMBALTA) 60 MG capsule Take 1 capsule (60 mg  total) by mouth daily. 90 capsule 3   estradiol (ESTRACE) 0.1 MG/GM vaginal cream estradiol 0.01% (0.1 mg/gram) vaginal cream  INSERT 1 ML VAGINALLY TWICE A WEEK     fish oil-omega-3 fatty acids 1000 MG capsule Take 2 g by mouth 2 (two) times daily.     losartan (COZAAR) 100 MG tablet Take 1 tablet (100 mg total) by mouth daily. 90 tablet 2   omeprazole (PRILOSEC) 20 MG capsule Take 1 capsule (20 mg total) by mouth daily. 90 capsule 3   traMADol (ULTRAM) 50 MG tablet Take 2 tablets (100 mg total) by mouth 2 (two) times daily. 120 tablet 0   zolpidem (AMBIEN CR) 12.5 MG CR tablet TAKE ONE TABLET BY MOUTH EVERY NIGHT AT BEDTIME AS NEEDED FOR SLEEP 30 tablet 3   No current facility-administered medications for this visit.   Facility-Administered Medications Ordered in Other Visits  Medication Dose Route Frequency Provider Last Rate Last Admin   [START ON 08/01/2021] feeding  supplement (ENSURE PRE-SURGERY) liquid 296 mL  296 mL Oral Once Coralie Keens, MD        PHYSICAL EXAMINATION: ECOG PERFORMANCE STATUS: 0 - Asymptomatic  Vitals:   07/31/21 1406  BP: 108/77  Pulse: 84  Resp: 16  Temp: 97.7 F (36.5 C)  SpO2: 100%   Filed Weights   07/31/21 1406  Weight: 147 lb 6.4 oz (66.9 kg)    GENERAL:alert, no distress and comfortable SKIN: skin color, texture, turgor are normal, no rashes or significant lesions EYES: normal, conjunctiva are pink and non-injected, sclera clear OROPHARYNX:no exudate, no erythema and lips, buccal mucosa, and tongue normal  NECK: supple, thyroid normal size, non-tender, without nodularity LYMPH:  no palpable lymphadenopathy in the cervical, axillary  LUNGS: clear to auscultation and percussion with normal breathing effort HEART: regular rate & rhythm and no murmurs and no lower extremity edema ABDOMEN:abdomen soft, non-tender and normal bowel sounds Musculoskeletal:no cyanosis of digits and no clubbing  PSYCH: alert & oriented x 3 with fluent speech NEURO: no focal motor/sensory deficits Breast exam deferred.  LABORATORY DATA:  I have reviewed the data as listed Lab Results  Component Value Date   WBC 6.0 04/06/2021   HGB 11.8 04/06/2021   HCT 35.1 04/06/2021   MCV 98.0 04/06/2021   PLT 290 04/06/2021     Chemistry      Component Value Date/Time   NA 139 04/06/2021 1449   K 4.7 04/06/2021 1449   CL 103 04/06/2021 1449   CO2 26 04/06/2021 1449   BUN 21 04/06/2021 1449   CREATININE 0.89 04/06/2021 1449      Component Value Date/Time   CALCIUM 9.8 04/06/2021 1449   ALKPHOS 56 10/24/2015 0850   AST 55 (H) 04/06/2021 1449   ALT 78 (H) 04/06/2021 1449   BILITOT 0.3 04/06/2021 1449       RADIOGRAPHIC STUDIES: I have personally reviewed the radiological images as listed and agreed with the findings in the report. US BREAST LTD UNI LEFT INC AXILLA  Result Date: 07/06/2021 CLINICAL DATA:  61 year old  female presenting as a recall from screening for possible left breast mass. EXAM: DIGITAL DIAGNOSTIC UNILATERAL LEFT MAMMOGRAM WITH TOMOSYNTHESIS AND CAD; ULTRASOUND LEFT BREAST LIMITED TECHNIQUE: Left digital diagnostic mammography and breast tomosynthesis was performed. The images were evaluated with computer-aided detection.; Targeted ultrasound examination of the left breast was performed. COMPARISON:  Previous exam(s). ACR Breast Density Category b: There are scattered areas of fibroglandular density. FINDINGS: Mammogram: Spot compression tomosynthesis views of  the left breast were performed demonstrating persistence of a subcentimeter irregular mass in the outer left breast. Ultrasound: Targeted ultrasound performed in the left breast at 2:30 o'clock 6 cm from the nipple demonstrating an irregular hypoechoic mass measuring 0.6 x 0.4 x 0.4 cm. This corresponds to the mammographic finding. Targeted ultrasound the left axilla demonstrates normal lymph nodes. IMPRESSION: Suspicious mass in the left breast at 2 o'clock measuring 0.6 cm. RECOMMENDATION: Ultrasound-guided core needle biopsy of the left breast mass. I have discussed the findings and recommendations with the patient who agrees to proceed with biopsy. The patient will be scheduled for the biopsy appointment prior to leaving the office today. BI-RADS CATEGORY  4: Suspicious. Electronically Signed   By: Audie Pinto M.D.   On: 07/06/2021 15:18  MM DIAG BREAST TOMO UNI LEFT  Result Date: 07/06/2021 CLINICAL DATA:  60 year old female presenting as a recall from screening for possible left breast mass. EXAM: DIGITAL DIAGNOSTIC UNILATERAL LEFT MAMMOGRAM WITH TOMOSYNTHESIS AND CAD; ULTRASOUND LEFT BREAST LIMITED TECHNIQUE: Left digital diagnostic mammography and breast tomosynthesis was performed. The images were evaluated with computer-aided detection.; Targeted ultrasound examination of the left breast was performed. COMPARISON:  Previous exam(s).  ACR Breast Density Category b: There are scattered areas of fibroglandular density. FINDINGS: Mammogram: Spot compression tomosynthesis views of the left breast were performed demonstrating persistence of a subcentimeter irregular mass in the outer left breast. Ultrasound: Targeted ultrasound performed in the left breast at 2:30 o'clock 6 cm from the nipple demonstrating an irregular hypoechoic mass measuring 0.6 x 0.4 x 0.4 cm. This corresponds to the mammographic finding. Targeted ultrasound the left axilla demonstrates normal lymph nodes. IMPRESSION: Suspicious mass in the left breast at 2 o'clock measuring 0.6 cm. RECOMMENDATION: Ultrasound-guided core needle biopsy of the left breast mass. I have discussed the findings and recommendations with the patient who agrees to proceed with biopsy. The patient will be scheduled for the biopsy appointment prior to leaving the office today. BI-RADS CATEGORY  4: Suspicious. Electronically Signed   By: Audie Pinto M.D.   On: 07/06/2021 15:18  MM CLIP PLACEMENT LEFT  Result Date: 07/19/2021 CLINICAL DATA:  Patient status post ultrasound-guided biopsy left breast mass. EXAM: 3D DIAGNOSTIC LEFT MAMMOGRAM POST ULTRASOUND BIOPSY COMPARISON:  Previous exam(s). FINDINGS: 3D Mammographic images were obtained following ultrasound guided biopsy of left breast mass. The biopsy marking clip is in expected position at the site of biopsy. IMPRESSION: Appropriate positioning of the ribbon shaped biopsy marking clip at the site of biopsy in the upper-outer left breast. Final Assessment: Post Procedure Mammograms for Marker Placement Electronically Signed   By: Lovey Newcomer M.D.   On: 07/19/2021 08:47  [place] LT BREAST BX W LOC DEV 1ST LESION IMG BX SPEC US GUIDE  Addendum Date: 07/27/2021   ADDENDUM REPORT: 07/27/2021 19:08 ADDENDUM: Pathology revealed GRADE II INVASIVE LOBULAR CARCINOMA, LOBULAR CARCINOMA IN SITU of the LEFT breast, 2:30 'clock, 6cmfn, upper outer, (ribbon clip).  This was found to be concordant by Dr. Lovey Newcomer. Pathology results were discussed with the patient and her husband by telephone. The patient reported doing well after the biopsy with tenderness at the site. Post biopsy instructions and care were reviewed and questions were answered. The patient was encouraged to call [facility] for any additional concerns. My direct phone number was provided. Surgical consultation has been arranged with Dr. Nedra Hai at [facility] on July 23, 2021. Recommendation for a bilateral breast MRI given the  lobular histology. Pathology results reported by Terie Purser, RN on 07/24/2021. Electronically Signed   By: Lovey Newcomer M.D.   On: 07/27/2021 19:08   Result Date: 07/27/2021 CLINICAL DATA:  Patient with suspicious left breast mass. EXAM: ULTRASOUND GUIDED LEFT BREAST CORE NEEDLE BIOPSY COMPARISON:  Previous exam(s). PROCEDURE: I met with the patient and we discussed the procedure of ultrasound-guided biopsy, including benefits and alternatives. We discussed the high likelihood of a successful procedure. We discussed the risks of the procedure, including infection, bleeding, tissue injury, clip migration, and inadequate sampling. Informed written consent was given. The usual time-out protocol was performed immediately prior to the procedure. Lesion quadrant: Upper outer quadrant Using sterile technique and 1% Lidocaine as local anesthetic, under direct ultrasound visualization, a 14 gauge spring-loaded device was used to perform biopsy of left breast mass using a lateral approach. At the conclusion of the procedure ribbon tissue marker clip was deployed into the biopsy cavity. Follow up 2 view mammogram was performed and dictated separately. IMPRESSION: Ultrasound guided biopsy of left breast mass. No apparent complications. Electronically Signed: By: Lovey Newcomer M.D. On: 07/19/2021 08:29   All questions were answered. The patient  knows to call the clinic with any problems, questions or concerns. I spent 60 minutes in the care of this patient including H and P, review of records, counseling and coordination of care as mentioned above.    Benay Pike, MD 07/31/2021 3:13 PM

## 2021-08-01 ENCOUNTER — Encounter: Payer: Self-pay | Admitting: *Deleted

## 2021-08-01 ENCOUNTER — Telehealth: Payer: Self-pay | Admitting: Radiation Oncology

## 2021-08-01 ENCOUNTER — Telehealth: Payer: Self-pay | Admitting: *Deleted

## 2021-08-01 DIAGNOSIS — Z170 Estrogen receptor positive status [ER+]: Secondary | ICD-10-CM

## 2021-08-01 NOTE — Telephone Encounter (Signed)
Called pt to provide navigation resources and contact information. No answer, unable to leave vm as vm is full.

## 2021-08-01 NOTE — Telephone Encounter (Signed)
Unable to LVM tos schedule FUN for RadOnc

## 2021-08-07 ENCOUNTER — Other Ambulatory Visit: Payer: Self-pay

## 2021-08-07 ENCOUNTER — Ambulatory Visit
Admission: RE | Admit: 2021-08-07 | Discharge: 2021-08-07 | Disposition: A | Discharge status: Home / Self Care | Payer: 59 | Source: Ambulatory Visit | Attending: Surgery | Admitting: Surgery

## 2021-08-07 ENCOUNTER — Other Ambulatory Visit: Payer: Self-pay | Admitting: Family Medicine

## 2021-08-07 DIAGNOSIS — C50912 Malignant neoplasm of unspecified site of left female breast: Secondary | ICD-10-CM | POA: Diagnosis not present

## 2021-08-07 DIAGNOSIS — Z853 Personal history of malignant neoplasm of breast: Secondary | ICD-10-CM

## 2021-08-07 MED ORDER — TRAMADOL HCL 50 MG PO TABS: 100.0000 mg | ORAL_TABLET | Freq: Two times a day (BID) | ORAL | 0 refills | Status: DC

## 2021-08-07 NOTE — Telephone Encounter (Signed)
Pt called in to see about getting a refill of traMADol (ULTRAM) 50 MG tablet [[id number]], as she had a procedure done today at the breast center and is in extreme pain. Please advise.  Cb#: [phone]

## 2021-08-07 NOTE — H&P (Signed)
REFERRING PHYSICIAN: Jake Samples, MD  PROVIDER: Beverlee Nims, MD  MRN: [id number] DOB: Jan 08, 1961 DATE OF ENCOUNTER: 07/23/2021 Subjective   Chief Complaint: Breast Cancer   History of Present Illness: Erika Harvey is a 61 y.o. female who is seen today as an office consultation at the request of Dr. Julien Girt for evaluation of Breast Cancer .   This is a 61 year old female who was found on recent screening mammography to have a 6 mm mass in the upper outer quadrant of the left breast. She underwent a biopsy of this showing an invasive lobular carcinoma with LCIS. Receptor status is still pending. She has had no previous problems with her breast. She has no nipple discharge. She reports that her mother died of cancer in her 22s and she believes it was breast cancer but the history is fairly vague. She is otherwise without complaints. She has no cardiopulmonary issues. She does have anxiety.  Review of Systems: A complete review of systems was obtained from the patient. I have reviewed this information and discussed as appropriate with the patient. See HPI as well for other ROS.  ROS   Medical History: Past Medical History:  Diagnosis Date   Anxiety   Arthritis   There is no problem list on file for this patient.  History reviewed. No pertinent surgical history.   Allergies  Allergen Reactions   Amlodipine Rash and Swelling   Current Outpatient Medications on File Prior to Visit  Medication Sig Dispense Refill   atenoloL (TENORMIN) 25 MG tablet atenolol 25 mg tablet   losartan (COZAAR) 100 MG tablet losartan 100 mg tablet   diazePAM (VALIUM) 10 MG tablet Take 10 mg by mouth every 12 (twelve) hours as needed for Anxiety   No current facility-administered medications on file prior to visit.   Family History  Problem Relation Age of Onset   Breast cancer Mother   High blood pressure (Hypertension) Father   Hyperlipidemia (Elevated cholesterol) Father    Diabetes Father    Social History   Tobacco Use  Smoking Status Never  Smokeless Tobacco Never    Social History   Socioeconomic History   Marital status: Married  Tobacco Use   Smoking status: Never   Smokeless tobacco: Never  Substance and Sexual Activity   Alcohol use: Never   Drug use: Never   Objective:   Vitals:  07/23/21 1008  BP: 136/80  Pulse: 79  Temp: 36.6 C (97.9 F)  SpO2: 98%  Weight: 67.5 kg (148 lb 12.8 oz)  Height: 160 cm (5\' 3" )   Body mass index is 26.36 kg/m.  Physical Exam   She appears well on exam  Breasts are examined. They are normal in appearance bilaterally. There is no axillary adenopathy in either side. The nipple areolar complexes are normal. There is mild ecchymosis at the site of the biopsy in the upper outer quadrant of the left breast  Lungs clear CV RRR Abdomen soft, NT  Labs, Imaging and Diagnostic Testing: I have reviewed her mammograms, ultrasound, and pathology results  Assessment and Plan:   Diagnoses and all orders for this visit:  Invasive lobular carcinoma of left breast in female (CMS-HCC) - Ambulatory Referral to Oncology-Medical - Ambulatory Referral to Radiation Oncology - Ambulatory Referral to Genetics    This is a 61 year old female with a small invasive lobular carcinoma of the left breast. I discussed the diagnosis with the patient and her husband in detail. I gave him a copy  of the pathology results. We had a long discussion regarding breast cancer and breast anatomy. We then discussed surgical options with treating breast cancer. We discussed both breast conservation and mastectomy. She is interested in breast conservation. We next discussed proceeding with a radioactive seed guided left breast lumpectomy and sentinel lymph node biopsy. We discussed the reasons for biopsying lymph nodes. I explained the surgical procedure in detail. We discussed the risks of both which includes but is not limited to  bleeding, infection, the need for further surgery if margins or nodes are positive, postop seroma formation, arm swelling, cardiopulmonary issues, etc. We also discussed the other treatment for breast cancer as well. She will be referred to the cancer center to see the medical oncologist, radiation oncologist, and geneticist. Surgery will be scheduled. They understand and agree with the plans.

## 2021-08-08 ENCOUNTER — Ambulatory Visit (HOSPITAL_BASED_OUTPATIENT_CLINIC_OR_DEPARTMENT_OTHER): Payer: 59 | Admitting: Certified Registered"

## 2021-08-08 ENCOUNTER — Encounter (HOSPITAL_BASED_OUTPATIENT_CLINIC_OR_DEPARTMENT_OTHER)
Admission: RE | Disposition: A | Discharge status: Home / Self Care | Payer: Self-pay | Source: Home / Self Care | Attending: Surgery

## 2021-08-08 ENCOUNTER — Other Ambulatory Visit: Payer: Self-pay

## 2021-08-08 ENCOUNTER — Ambulatory Visit (HOSPITAL_BASED_OUTPATIENT_CLINIC_OR_DEPARTMENT_OTHER)
Admission: RE | Admit: 2021-08-08 | Discharge: 2021-08-08 | Disposition: A | Discharge status: Home / Self Care | Payer: 59 | Attending: Surgery | Admitting: Surgery

## 2021-08-08 ENCOUNTER — Encounter (HOSPITAL_BASED_OUTPATIENT_CLINIC_OR_DEPARTMENT_OTHER): Payer: Self-pay | Admitting: Surgery

## 2021-08-08 ENCOUNTER — Ambulatory Visit
Admission: RE | Admit: 2021-08-08 | Discharge: 2021-08-08 | Disposition: A | Discharge status: Home / Self Care | Payer: 59 | Source: Ambulatory Visit | Attending: Surgery | Admitting: Surgery

## 2021-08-08 DIAGNOSIS — I10 Essential (primary) hypertension: Secondary | ICD-10-CM | POA: Diagnosis not present

## 2021-08-08 DIAGNOSIS — Z170 Estrogen receptor positive status [ER+]: Secondary | ICD-10-CM | POA: Insufficient documentation

## 2021-08-08 DIAGNOSIS — F419 Anxiety disorder, unspecified: Secondary | ICD-10-CM | POA: Diagnosis not present

## 2021-08-08 DIAGNOSIS — C50412 Malignant neoplasm of upper-outer quadrant of left female breast: Secondary | ICD-10-CM | POA: Insufficient documentation

## 2021-08-08 DIAGNOSIS — K219 Gastro-esophageal reflux disease without esophagitis: Secondary | ICD-10-CM | POA: Insufficient documentation

## 2021-08-08 DIAGNOSIS — R69 Illness, unspecified: Secondary | ICD-10-CM | POA: Diagnosis not present

## 2021-08-08 DIAGNOSIS — R928 Other abnormal and inconclusive findings on diagnostic imaging of breast: Secondary | ICD-10-CM | POA: Diagnosis not present

## 2021-08-08 DIAGNOSIS — Z803 Family history of malignant neoplasm of breast: Secondary | ICD-10-CM | POA: Diagnosis not present

## 2021-08-08 DIAGNOSIS — Z853 Personal history of malignant neoplasm of breast: Secondary | ICD-10-CM

## 2021-08-08 HISTORY — PX: BREAST LUMPECTOMY: SHX2

## 2021-08-08 HISTORY — DX: Unspecified osteoarthritis, unspecified site: M19.90

## 2021-08-08 HISTORY — PX: BREAST LUMPECTOMY WITH RADIOACTIVE SEED AND SENTINEL LYMPH NODE BIOPSY: SHX6550

## 2021-08-08 SURGERY — BREAST LUMPECTOMY WITH RADIOACTIVE SEED AND SENTINEL LYMPH NODE BIOPSY
Anesthesia: General | Site: Breast | Laterality: Left

## 2021-08-08 MED ORDER — FENTANYL CITRATE (PF) 100 MCG/2ML IJ SOLN
INTRAMUSCULAR | Status: AC
  Filled 2021-08-08: qty 2

## 2021-08-08 MED ORDER — GLYCOPYRROLATE PF 0.2 MG/ML IJ SOSY
PREFILLED_SYRINGE | INTRAMUSCULAR | Status: AC
  Filled 2021-08-08: qty 1

## 2021-08-08 MED ORDER — CHLORHEXIDINE GLUCONATE CLOTH 2 % EX PADS: 6.0000 | MEDICATED_PAD | Freq: Once | CUTANEOUS | Status: DC

## 2021-08-08 MED ORDER — BUPIVACAINE-EPINEPHRINE (PF) 0.5% -1:200000 IJ SOLN
INTRAMUSCULAR | Status: DC | PRN
  Administered 2021-08-08: 20 mL via PERINEURAL

## 2021-08-08 MED ORDER — PROMETHAZINE HCL 25 MG/ML IJ SOLN: 6.2500 mg | INTRAMUSCULAR | Status: DC | PRN

## 2021-08-08 MED ORDER — ONDANSETRON HCL 4 MG/2ML IJ SOLN
INTRAMUSCULAR | Status: DC | PRN
  Administered 2021-08-08: 4 mg via INTRAVENOUS

## 2021-08-08 MED ORDER — MIDAZOLAM HCL 2 MG/2ML IJ SOLN
2.0000 mg | Freq: Once | INTRAMUSCULAR | Status: AC
  Administered 2021-08-08: 2 mg via INTRAVENOUS

## 2021-08-08 MED ORDER — MAGTRACE LYMPHATIC TRACER
INTRAMUSCULAR | Status: DC | PRN
  Administered 2021-08-08: 2 mL via INTRAMUSCULAR

## 2021-08-08 MED ORDER — CEFAZOLIN SODIUM-DEXTROSE 2-4 GM/100ML-% IV SOLN
INTRAVENOUS | Status: AC
  Filled 2021-08-08: qty 100

## 2021-08-08 MED ORDER — BUPIVACAINE LIPOSOME 1.3 % IJ SUSP
INTRAMUSCULAR | Status: DC | PRN
  Administered 2021-08-08: 10 mL via PERINEURAL

## 2021-08-08 MED ORDER — OXYCODONE HCL 5 MG PO TABS: 5.0000 mg | ORAL_TABLET | Freq: Once | ORAL | Status: DC | PRN

## 2021-08-08 MED ORDER — ACETAMINOPHEN 500 MG PO TABS
1000.0000 mg | ORAL_TABLET | ORAL | Status: AC
  Administered 2021-08-08: 1000 mg via ORAL

## 2021-08-08 MED ORDER — MIDAZOLAM HCL 2 MG/2ML IJ SOLN
INTRAMUSCULAR | Status: AC
  Filled 2021-08-08: qty 2

## 2021-08-08 MED ORDER — OXYCODONE HCL 5 MG/5ML PO SOLN: 5.0000 mg | Freq: Once | ORAL | Status: DC | PRN

## 2021-08-08 MED ORDER — TRAMADOL HCL 50 MG PO TABS: 50.0000 mg | ORAL_TABLET | Freq: Four times a day (QID) | ORAL | 0 refills | Status: DC | PRN

## 2021-08-08 MED ORDER — EPHEDRINE 5 MG/ML INJ
INTRAVENOUS | Status: AC
  Filled 2021-08-08: qty 15

## 2021-08-08 MED ORDER — BUPIVACAINE-EPINEPHRINE 0.5% -1:200000 IJ SOLN
INTRAMUSCULAR | Status: DC | PRN
  Administered 2021-08-08: 15 mL

## 2021-08-08 MED ORDER — FENTANYL CITRATE (PF) 100 MCG/2ML IJ SOLN: 25.0000 ug | INTRAMUSCULAR | Status: DC | PRN

## 2021-08-08 MED ORDER — PROPOFOL 500 MG/50ML IV EMUL
INTRAVENOUS | Status: DC | PRN
  Administered 2021-08-08: 25 ug/kg/min via INTRAVENOUS

## 2021-08-08 MED ORDER — CEFAZOLIN SODIUM-DEXTROSE 2-4 GM/100ML-% IV SOLN
2.0000 g | INTRAVENOUS | Status: AC
  Administered 2021-08-08: 2 g via INTRAVENOUS

## 2021-08-08 MED ORDER — PHENYLEPHRINE 40 MCG/ML (10ML) SYRINGE FOR IV PUSH (FOR BLOOD PRESSURE SUPPORT)
PREFILLED_SYRINGE | INTRAVENOUS | Status: AC
  Filled 2021-08-08: qty 30

## 2021-08-08 MED ORDER — FENTANYL CITRATE (PF) 100 MCG/2ML IJ SOLN
INTRAMUSCULAR | Status: DC | PRN
  Administered 2021-08-08: 50 ug via INTRAVENOUS

## 2021-08-08 MED ORDER — ACETAMINOPHEN 500 MG PO TABS
ORAL_TABLET | ORAL | Status: AC
  Filled 2021-08-08: qty 2

## 2021-08-08 MED ORDER — DEXAMETHASONE SODIUM PHOSPHATE 4 MG/ML IJ SOLN
INTRAMUSCULAR | Status: DC | PRN
  Administered 2021-08-08: 4 mg via INTRAVENOUS

## 2021-08-08 MED ORDER — LACTATED RINGERS IV SOLN: INTRAVENOUS | Status: DC

## 2021-08-08 MED ORDER — LIDOCAINE HCL (CARDIAC) PF 100 MG/5ML IV SOSY
PREFILLED_SYRINGE | INTRAVENOUS | Status: DC | PRN
Start: 2021-08-08 — End: 2021-08-08
  Administered 2021-08-08: 60 mg via INTRAVENOUS

## 2021-08-08 MED ORDER — PROPOFOL 10 MG/ML IV BOLUS
INTRAVENOUS | Status: DC | PRN
  Administered 2021-08-08: 140 mg via INTRAVENOUS

## 2021-08-08 MED ORDER — FENTANYL CITRATE (PF) 100 MCG/2ML IJ SOLN
100.0000 ug | Freq: Once | INTRAMUSCULAR | Status: AC
  Administered 2021-08-08: 100 ug via INTRAVENOUS

## 2021-08-08 MED ORDER — CYCLOBENZAPRINE HCL 10 MG PO TABS: ORAL_TABLET | ORAL | 2 refills | Status: DC

## 2021-08-08 SURGICAL SUPPLY — 46 items
ADH SKN CLS APL DERMABOND .7 (GAUZE/BANDAGES/DRESSINGS) ×1
APL PRP STRL LF DISP 70% ISPRP (MISCELLANEOUS) ×1
APPLIER CLIP 9.375 MED OPEN (MISCELLANEOUS) ×2
APR CLP MED 9.3 20 MLT OPN (MISCELLANEOUS) ×1
BINDER BREAST XLRG (GAUZE/BANDAGES/DRESSINGS) ×1 IMPLANT
BLADE SURG 15 STRL LF DISP TIS (BLADE) ×1 IMPLANT
BLADE SURG 15 STRL SS (BLADE) ×2
CHLORAPREP W/TINT 26 (MISCELLANEOUS) ×2 IMPLANT
CLIP APPLIE 9.375 MED OPEN (MISCELLANEOUS) ×1 IMPLANT
COVER BACK TABLE 60X90IN (DRAPES) ×2 IMPLANT
COVER MAYO STAND STRL (DRAPES) ×2 IMPLANT
COVER PROBE W GEL 5X96 (DRAPES) ×2 IMPLANT
DERMABOND ADVANCED (GAUZE/BANDAGES/DRESSINGS) ×1
DERMABOND ADVANCED .7 DNX12 (GAUZE/BANDAGES/DRESSINGS) ×1 IMPLANT
DRAPE LAPAROSCOPIC ABDOMINAL (DRAPES) ×2 IMPLANT
DRAPE UTILITY XL STRL (DRAPES) ×2 IMPLANT
ELECT REM PT RETURN 9FT ADLT (ELECTROSURGICAL) ×2
ELECTRODE REM PT RTRN 9FT ADLT (ELECTROSURGICAL) ×1 IMPLANT
GLOVE SURG POLYISO LF SZ7 (GLOVE) ×1 IMPLANT
GLOVE SURG POLYISO LF SZ7.5 (GLOVE) ×1 IMPLANT
GLOVE SURG SIGNA 7.5 PF LTX (GLOVE) ×2 IMPLANT
GLOVE SURG UNDER POLY LF SZ7 (GLOVE) ×1 IMPLANT
GLOVE SURG UNDER POLY LF SZ7.5 (GLOVE) ×1 IMPLANT
GOWN STRL REUS W/ TWL LRG LVL3 (GOWN DISPOSABLE) ×1 IMPLANT
GOWN STRL REUS W/ TWL XL LVL3 (GOWN DISPOSABLE) ×1 IMPLANT
GOWN STRL REUS W/TWL LRG LVL3 (GOWN DISPOSABLE) ×2
GOWN STRL REUS W/TWL XL LVL3 (GOWN DISPOSABLE) ×4
KIT MARKER MARGIN INK (KITS) ×2 IMPLANT
NDL HYPO 25X1 1.5 SAFETY (NEEDLE) ×1 IMPLANT
NDL SAFETY ECLIPSE 18X1.5 (NEEDLE) ×1 IMPLANT
NEEDLE HYPO 18GX1.5 SHARP (NEEDLE) ×2
NEEDLE HYPO 25X1 1.5 SAFETY (NEEDLE) ×2 IMPLANT
NS IRRIG 1000ML POUR BTL (IV SOLUTION) ×1 IMPLANT
PACK BASIN DAY SURGERY FS (CUSTOM PROCEDURE TRAY) ×2 IMPLANT
PENCIL SMOKE EVACUATOR (MISCELLANEOUS) ×2 IMPLANT
SLEEVE SCD COMPRESS KNEE MED (STOCKING) ×2 IMPLANT
SPONGE T-LAP 4X18 ~~LOC~~+RFID (SPONGE) ×2 IMPLANT
SUT MNCRL AB 4-0 PS2 18 (SUTURE) ×2 IMPLANT
SUT VIC AB 3-0 SH 27 (SUTURE) ×2
SUT VIC AB 3-0 SH 27X BRD (SUTURE) ×1 IMPLANT
SYR CONTROL 10ML LL (SYRINGE) ×2 IMPLANT
TOWEL GREEN STERILE FF (TOWEL DISPOSABLE) ×2 IMPLANT
TRACER MAGTRACE VIAL (MISCELLANEOUS) ×1 IMPLANT
TRAY FAXITRON CT DISP (TRAY / TRAY PROCEDURE) ×2 IMPLANT
TUBE CONNECTING 20X1/4 (TUBING) ×1 IMPLANT
YANKAUER SUCT BULB TIP NO VENT (SUCTIONS) ×1 IMPLANT

## 2021-08-08 NOTE — Anesthesia Postprocedure Evaluation (Signed)
Anesthesia Post Note  Patient: Erika Harvey  Procedure(s) Performed: LEFT BREAST LUMPECTOMY WITH RADIOACTIVE SEED AND SENTINEL LYMPH NODE BIOPSY (Left: Breast)     Patient location during evaluation: PACU Anesthesia Type: General Level of consciousness: awake and alert Pain management: pain level controlled Vital Signs Assessment: post-procedure vital signs reviewed and stable Respiratory status: spontaneous breathing, nonlabored ventilation and respiratory function stable Cardiovascular status: blood pressure returned to baseline Postop Assessment: no apparent nausea or vomiting Anesthetic complications: no   No notable events documented.  Last Vitals:  Vitals:   08/08/21 1215 08/08/21 1337  BP: (!) 151/89 (!) 143/75  Pulse: 80 75  Resp: 12 16  Temp:  36.6 C  SpO2: 100% 99%    Last Pain:  Vitals:   08/08/21 1337  PainSc: 0-No pain                 Marthenia Rolling

## 2021-08-08 NOTE — Anesthesia Procedure Notes (Signed)
Procedure Name: LMA Insertion Date/Time: 08/08/2021 12:28 PM Performed by: Signe Colt, CRNA Pre-anesthesia Checklist: Patient identified, Emergency Drugs available, Suction available and Patient being monitored Patient Re-evaluated:Patient Re-evaluated prior to induction Oxygen Delivery Method: Circle System Utilized Preoxygenation: Pre-oxygenation with 100% oxygen Induction Type: IV induction Ventilation: Mask ventilation without difficulty LMA: LMA inserted LMA Size: 4.0 Number of attempts: 1 Airway Equipment and Method: bite block Placement Confirmation: positive ETCO2 Tube secured with: Tape Dental Injury: Teeth and Oropharynx as per pre-operative assessment

## 2021-08-08 NOTE — Transfer of Care (Signed)
Immediate Anesthesia Transfer of Care Note  Patient: Erika Harvey  Procedure(s) Performed: LEFT BREAST LUMPECTOMY WITH RADIOACTIVE SEED AND SENTINEL LYMPH NODE BIOPSY (Left: Breast)  Patient Location: PACU  Anesthesia Type:GA combined with regional for post-op pain  Level of Consciousness: awake, alert , oriented and patient cooperative  Airway & Oxygen Therapy: Patient Spontanous Breathing and Patient connected to face mask oxygen  Post-op Assessment: Report given to RN and Post -op Vital signs reviewed and stable  Post vital signs: Reviewed and stable  Last Vitals:  Vitals Value Taken Time  BP 143/75 08/08/21 1337  Temp    Pulse 75 08/08/21 1337  Resp 14 08/08/21 1338  SpO2 99 % 08/08/21 1337  Vitals shown include unvalidated device data.  Last Pain:  Vitals:   08/08/21 1127  PainSc: 0-No pain         Complications: No notable events documented.

## 2021-08-08 NOTE — Anesthesia Procedure Notes (Signed)
Anesthesia Regional Block: Pectoralis block   Pre-Anesthetic Checklist: , timeout performed,  Correct Patient, Correct Site, Correct Laterality,  Correct Procedure, Correct Position, site marked,  Risks and benefits discussed,  Pre-op evaluation,  At surgeon's request and post-op pain management  Laterality: Left  Prep: Maximum Sterile Barrier Precautions used, chloraprep       Needles:  Injection technique: Single-shot  Needle Type: Echogenic Stimulator Needle     Needle Length: 9cm  Needle Gauge: 22     Additional Needles:   Procedures:,,,, ultrasound used (permanent image in chart),,    Narrative:  Start time: 08/08/2021 12:05 PM End time: 08/08/2021 12:08 PM Injection made incrementally with aspirations every 5 mL.  Performed by: Personally  Anesthesiologist: Brennan Bailey, MD  Additional Notes: Risks, benefits, and alternative discussed. Patient gave consent for procedure. Patient prepped and draped in sterile fashion. Sedation administered, patient remains easily responsive to voice. Relevant anatomy identified with ultrasound guidance. Local anesthetic given in 5cc increments with no signs or symptoms of intravascular injection. No pain or paraesthesias with injection. Patient monitored throughout procedure with signs of LAST or immediate complications. Tolerated well. Ultrasound image placed in chart.  Tawny Asal, MD

## 2021-08-08 NOTE — Op Note (Signed)
Erika Harvey 08/08/2021   Pre-op Diagnosis: LEFT BREAST CANCER     Post-op Diagnosis: same  Procedure(s): LEFT BREAST LUMPECTOMY WITH RADIOACTIVE SEED AND DEEP AXILLARY SENTINEL LYMPH NODE BIOPSY INJECTION OF MAGTRACE  Surgeon(s): Coralie Keens, MD Assist:  Yvone Neu MD ([facility] Resident)  Anesthesia: General  Staff:  Circulator: Izora Ribas, RN Scrub Person: Lorenza Burton, CST  Estimated Blood Loss: Minimal               Specimens: SENT TO PATH  Indications: This is a 61 year old female was found to have a small mass on screening mammography of the left breast.  She underwent a stereotactic biopsy showed invasive lobular left breast cancer.  Ultrasound it measured approximately 6 mm.  Decision was made to proceed with a radioactive seed guided left breast lumpectomy and sentinel lymph node biopsy  Procedure: The patient was identified in preoperative holding area.  Her left breast was prepped.  Anesthetized skin with lidocaine and then injected mag trace underneath the nipple areolar complex.  Nerve block was performed by anesthesiology. The patient was taken to the operating room.  She was placed in the spine position on the operating room table and general anesthesia was induced.  I gently massage the breast prior to prepping the patient.  Her left breast and axilla were then prepped and draped in usual sterile fashion.  The radioactive seed was located with the neoprobe in the lower outer quadrant of the breast at least 7 to 8 cm from nipple areolar complex.  We thus elected to make the incision on the breast over the top of the signal from the neoprobe.  The skin was anesthetized with Marcaine and a longitudinal incision made on the breast.  Dissection was then carried down to the breast tissue with electrocautery.  Using the neoprobe we then stayed wildly around the radioactive seed going down into the chest wall circumferentially.  We then completed lumpectomy  staying underneath the breast tissue.  Once the lumpectomy was completed all margins were marked with paint.  An x-ray was performed confirming the radioactive seed and previous tissue biopsy marker in the specimen. We next turned our attention toward the left axilla.  There appeared to be uptake of mag trace in the axilla with the probe.  The skin was anesthetized with Marcaine and incision was made with the scalpel.  I then dissected down to the deep axillary tissue.  There was intermittent uptake with the aid of the symptomatic probe.  I was able to grab some of the fatty axillary tissue in the deep axilla with cautery and remove remove several lymph nodes and 2 separate pieces of fatty tissue.  I could palpate no enlarged lymph nodes deep in the axilla.  At this point the decision was made to forego any further dissection in the axilla.  The specimens were sent to pathology for evaluation.  When explained surgical clips around the periphery of the lumpectomy cavity.  We anesthetized the incisions for the Marcaine.  Both incisions were then closed with interrupted 3-0 Vicryl sutures and running 4-0 Monocryl sutures.  Dermabond was then applied.  The patient tolerated the procedure well.  All the counts were correct at the end of the procedure.  The patient was then extubated in the operating room and taken in a stable condition to the recovery room.          Coralie Keens   Date: 08/08/2021  Time: 1:36 PM

## 2021-08-08 NOTE — Anesthesia Preprocedure Evaluation (Addendum)
Anesthesia Evaluation  Patient identified by MRN, date of birth, ID band Patient awake    Reviewed: Allergy & Precautions, NPO status , Patient's Chart, lab work & pertinent test results  History of Anesthesia Complications Negative for: history of anesthetic complications  Airway Mallampati: II  TM Distance: >3 FB Neck ROM: Full    Dental no notable dental hx.    Pulmonary neg pulmonary ROS,    Pulmonary exam normal        Cardiovascular hypertension, Pt. on medications Normal cardiovascular exam     Neuro/Psych Anxiety negative neurological ROS     GI/Hepatic Neg liver ROS, GERD  Medicated and Controlled,  Endo/Other  negative endocrine ROS  Renal/GU negative Renal ROS  negative genitourinary   Musculoskeletal  (+) Arthritis ,   Abdominal   Peds  Hematology negative hematology ROS (+)   Anesthesia Other Findings LEFT BREAST CANCER  Reproductive/Obstetrics negative OB ROS                            Anesthesia Physical Anesthesia Plan  ASA: 2  Anesthesia Plan: General   Post-op Pain Management: Tylenol PO (pre-op)   Induction: Intravenous  PONV Risk Score and Plan: 3 and Treatment may vary due to age or medical condition, Ondansetron, Dexamethasone and Midazolam  Airway Management Planned: LMA  Additional Equipment: None  Intra-op Plan:   Post-operative Plan: Extubation in OR  Informed Consent: I have reviewed the patients History and Physical, chart, labs and discussed the procedure including the risks, benefits and alternatives for the proposed anesthesia with the patient or authorized representative who has indicated his/her understanding and acceptance.     Dental advisory given  Plan Discussed with: CRNA  Anesthesia Plan Comments:        Anesthesia Quick Evaluation

## 2021-08-08 NOTE — Progress Notes (Signed)
Assisted Dr. Daiva Huge with left, ultrasound guided, pectoralis block. Side rails up, monitors on throughout procedure. See vital signs in flow sheet. Tolerated Procedure well.

## 2021-08-08 NOTE — Interval H&P Note (Signed)
History and Physical Interval Note: no change in H and P  08/08/2021 11:38 AM  Erika Harvey  has presented today for surgery, with the diagnosis of LEFT BREAST CANCER.  The various methods of treatment have been discussed with the patient and family. After consideration of risks, benefits and other options for treatment, the patient has consented to  Procedure(s): LEFT BREAST LUMPECTOMY WITH RADIOACTIVE SEED AND SENTINEL LYMPH NODE BIOPSY (Left) as a surgical intervention.  The patient's history has been reviewed, patient examined, no change in status, stable for surgery.  I have reviewed the patient's chart and labs.  Questions were answered to the patient's satisfaction.     Coralie Keens

## 2021-08-08 NOTE — Discharge Instructions (Addendum)
[place] Office Phone Number [phone]  BREAST BIOPSY/ PARTIAL MASTECTOMY: POST OP INSTRUCTIONS  Always review your discharge instruction sheet given to you by the facility where your surgery was performed.  IF YOU HAVE DISABILITY OR FAMILY LEAVE FORMS, YOU MUST BRING THEM TO THE OFFICE FOR PROCESSING.  DO NOT GIVE THEM TO YOUR DOCTOR.  A prescription for pain medication may be given to you upon discharge.  Take your pain medication as prescribed, if needed.  If narcotic pain medicine is not needed, then you may take acetaminophen (Tylenol) or ibuprofen (Advil) as needed. Take your usually prescribed medications unless otherwise directed If you need a refill on your pain medication, please contact your pharmacy.  They will contact our office to request authorization.  Prescriptions will not be filled after 5pm or on week-ends. You should eat very light the first 24 hours after surgery, such as soup, crackers, pudding, etc.  Resume your normal diet the day after surgery. Most patients will experience some swelling and bruising in the breast.  Ice packs and a good support bra will help.  Swelling and bruising can take several days to resolve.  It is common to experience some constipation if taking pain medication after surgery.  Increasing fluid intake and taking a stool softener will usually help or prevent this problem from occurring.  A mild laxative (Milk of Magnesia or Miralax) should be taken according to package directions if there are no bowel movements after 48 hours. Unless discharge instructions indicate otherwise, you may remove your bandages 24-48 hours after surgery, and you may shower at that time.  You may have steri-strips (small skin tapes) in place directly over the incision.  These strips should be left on the skin for 7-10 days.  If your surgeon used skin glue on the incision, you may shower in 24 hours.  The glue will flake off over the next 2-3 weeks.  Any  sutures or staples will be removed at the office during your follow-up visit. ACTIVITIES:  You may resume regular daily activities (gradually increasing) beginning the next day.  Wearing a good support bra or sports bra minimizes pain and swelling.  You may have sexual intercourse when it is comfortable. You may drive when you no longer are taking prescription pain medication, you can comfortably wear a seatbelt, and you can safely maneuver your car and apply brakes. RETURN TO WORK:  ______________________________________________________________________________________ Erika Harvey should see your doctor in the office for a follow-up appointment approximately two weeks after your surgery.  Your doctors nurse will typically make your follow-up appointment when she calls you with your pathology report.  Expect your pathology report 2-3 business days after your surgery.  You may call to check if you do not hear from [place] after three days. OTHER INSTRUCTIONS: OK  TO SHOWER STARTING TOMORROW ICE PACK, TYLENOL, AND IBUPROFEN ALSO FOR PAIN NO VIGOROUS ACTIVITY FOR 1 WEEK_______________________________________________________________________________________________ _____________________________________________________________________________________________________________________________________ _____________________________________________________________________________________________________________________________________ _____________________________________________________________________________________________________________________________________  WHEN TO CALL YOUR DOCTOR: Fever over 101.0 Nausea and/or vomiting. Extreme swelling or bruising. Continued bleeding from incision. Increased pain, redness, or drainage from the incision.  The clinic staff is available to answer your questions during regular business hours.  Please dont hesitate to call and ask to speak to one of the nurses for clinical  concerns.  If you have a medical emergency, go to the nearest emergency room or call 911.  A surgeon from [facility] is always on call at the hospital.  For further questions, please visit  [email or web address]   Post Anesthesia Home Care Instructions  Activity: Get plenty of rest for the remainder of the day. A responsible individual must stay with you for 24 hours following the procedure.  For the next 24 hours, DO NOT: -Drive a car -Paediatric nurse -Drink alcoholic beverages -Take any medication unless instructed by your physician -Make any legal decisions or sign important papers.  Meals: Start with liquid foods such as gelatin or soup. Progress to regular foods as tolerated. Avoid greasy, spicy, heavy foods. If nausea and/or vomiting occur, drink only clear liquids until the nausea and/or vomiting subsides. Call your physician if vomiting continues.  Special Instructions/Symptoms: Your throat may feel dry or sore from the anesthesia or the breathing tube placed in your throat during surgery. If this causes discomfort, gargle with warm salt water. The discomfort should disappear within 24 hours.  If you had a scopolamine patch placed behind your ear for the management of post- operative nausea and/or vomiting:  1. The medication in the patch is effective for 72 hours, after which it should be removed.  Wrap patch in a tissue and discard in the trash. Wash hands thoroughly with soap and water. 2. You may remove the patch earlier than 72 hours if you experience unpleasant side effects which may include dry mouth, dizziness or visual disturbances. 3. Avoid touching the patch. Wash your hands with soap and water after contact with the patch.        Next dose of Tylenol at 3p

## 2021-08-09 ENCOUNTER — Encounter (HOSPITAL_BASED_OUTPATIENT_CLINIC_OR_DEPARTMENT_OTHER): Payer: Self-pay | Admitting: Surgery

## 2021-08-11 ENCOUNTER — Other Ambulatory Visit: Payer: Self-pay | Admitting: Family Medicine

## 2021-08-11 DIAGNOSIS — F419 Anxiety disorder, unspecified: Secondary | ICD-10-CM

## 2021-08-13 ENCOUNTER — Other Ambulatory Visit: Payer: Self-pay

## 2021-08-13 DIAGNOSIS — G4700 Insomnia, unspecified: Secondary | ICD-10-CM

## 2021-08-13 LAB — SURGICAL PATHOLOGY

## 2021-08-13 MED ORDER — ZOLPIDEM TARTRATE ER 12.5 MG PO TBCR: EXTENDED_RELEASE_TABLET | ORAL | 3 refills | Status: DC

## 2021-08-13 NOTE — Telephone Encounter (Signed)
Valium refill request.  Last filled 07/13/2021, last seen 04/06/2021.

## 2021-08-13 NOTE — Telephone Encounter (Signed)
Ambien refill request.  Last seen 03/2021 last filled 06/2021.

## 2021-08-15 ENCOUNTER — Telehealth: Payer: Self-pay | Admitting: *Deleted

## 2021-08-15 ENCOUNTER — Encounter: Payer: Self-pay | Admitting: *Deleted

## 2021-08-15 NOTE — Telephone Encounter (Signed)
Ordered oncotype per Dr. Chryl Heck. Faxed requisition to [facility].

## 2021-08-24 DIAGNOSIS — Z170 Estrogen receptor positive status [ER+]: Secondary | ICD-10-CM | POA: Diagnosis not present

## 2021-08-24 DIAGNOSIS — C50412 Malignant neoplasm of upper-outer quadrant of left female breast: Secondary | ICD-10-CM | POA: Diagnosis not present

## 2021-08-27 ENCOUNTER — Encounter (HOSPITAL_COMMUNITY): Payer: Self-pay

## 2021-08-27 ENCOUNTER — Telehealth: Payer: Self-pay | Admitting: *Deleted

## 2021-08-27 NOTE — Telephone Encounter (Signed)
Received Oncotype score of 28. Physician team notified.

## 2021-08-27 NOTE — Telephone Encounter (Signed)
This RN attempted to reach pt x 2 and obtained identified VM- message left informing pt of need for appt this week per MD to review results of Onco type.  This RN's name and return number given for return call.

## 2021-08-28 ENCOUNTER — Telehealth: Payer: Self-pay | Admitting: Hematology and Oncology

## 2021-08-28 NOTE — Telephone Encounter (Signed)
Tried calling patient to see if she can come in this week or next week to see Dr.Iruku.. Unable to leave message due to mailbox being full. Will follow up.

## 2021-08-28 NOTE — Telephone Encounter (Signed)
Called patient to see if she can come in today or sometimes this week to see Dr.Iruku. Unable to leave a voicemail due to the mailbox being full. Will try again.

## 2021-08-28 NOTE — Telephone Encounter (Signed)
Scheduled appointment per 02/20 los. Patient aware of times.

## 2021-08-29 ENCOUNTER — Encounter: Payer: Self-pay | Admitting: Hematology and Oncology

## 2021-08-29 ENCOUNTER — Other Ambulatory Visit: Payer: Self-pay

## 2021-08-29 ENCOUNTER — Inpatient Hospital Stay: Payer: 59 | Attending: Hematology and Oncology | Admitting: Hematology and Oncology

## 2021-08-29 DIAGNOSIS — Z803 Family history of malignant neoplasm of breast: Secondary | ICD-10-CM | POA: Diagnosis not present

## 2021-08-29 DIAGNOSIS — Z8249 Family history of ischemic heart disease and other diseases of the circulatory system: Secondary | ICD-10-CM | POA: Diagnosis not present

## 2021-08-29 DIAGNOSIS — Z79899 Other long term (current) drug therapy: Secondary | ICD-10-CM | POA: Insufficient documentation

## 2021-08-29 DIAGNOSIS — Z833 Family history of diabetes mellitus: Secondary | ICD-10-CM | POA: Diagnosis not present

## 2021-08-29 DIAGNOSIS — Z170 Estrogen receptor positive status [ER+]: Secondary | ICD-10-CM | POA: Diagnosis not present

## 2021-08-29 DIAGNOSIS — C50412 Malignant neoplasm of upper-outer quadrant of left female breast: Secondary | ICD-10-CM | POA: Insufficient documentation

## 2021-08-29 DIAGNOSIS — Z7289 Other problems related to lifestyle: Secondary | ICD-10-CM | POA: Diagnosis not present

## 2021-08-29 NOTE — Assessment & Plan Note (Addendum)
This is a very pleasant 61 year old postmenopausal female patient with newly diagnosed invasive lobular carcinoma of the left breast, T1 N0 M0 grade, ER/PR positive, ER strong staining intensity 100%, PR 40% positive, strong staining intensity, HER2 negative and KI of 10% status post left lumpectomy.  Final pathology showed invasive lobular carcinoma, grade 3 measuring 0.7 cm along with low-grade DCIS, resection margins negative, all sentinel lymph nodes negative for carcinoma. Oncotype testing was sent which showed recurrence score of 28, distant recurrence risk at 9 years with tamoxifen alone is 17% and chemotherapy benefit was greater than 15%.  She is here to discuss Oncotype results and role of adjuvant chemotherapy.  Given high Oncotype score and deemed chemotherapy benefit, I have discussed about considering adjuvant docetaxel and cyclophosphamide for 4 cycles.  We have discussed about the regimen, schedule every 21 days for 4 cycles, adverse effects of chemotherapy including but not limited to fatigue, nausea, vomiting, diarrhea, increased risk of infections, neuropathy, cytopenias.  She understands that some of the side effects can be life-threatening and permanent.  I do believe overall this regimen is well-tolerated.  After chemotherapy, she can proceed with adjuvant radiation which will be then followed by adjuvant antiestrogen therapy.  She would like to think about since her RS is score just over 26 and there may not be a significant benefit in patients with recurrent score of 26.  I have clearly explained to her and her husband that chemotherapy will add benefit in addition to radiation and adjuvant antiestrogen therapy and reducing the risk of breast cancer.  She understands that when breast cancer returns, it could return at distant sites and be stage IV breast cancer which may not be curable.  She would like to think about it over the weekend and let us know.  If she decides to forego  chemotherapy, she can proceed with adjuvant radiation followed by antiestrogen therapy.

## 2021-08-29 NOTE — Progress Notes (Signed)
[place] CONSULT NOTE  Patient Care Team: [name], MD as PCP - General (Family Medicine) Coralie Keens, MD as Consulting Physician (General Surgery) Mauro Kaufmann, RN as Oncology Nurse Navigator Rockwell Germany, RN as Oncology Nurse Navigator  CHIEF COMPLAINTS/PURPOSE OF CONSULTATION:  [place] left breast  ASSESSMENT & PLAN:  Malignant neoplasm of upper-outer quadrant of left breast in female, estrogen receptor positive [facility]) This is a very pleasant 61 year old postmenopausal female patient with newly diagnosed invasive lobular carcinoma of the left breast, T1 N0 M0 grade, ER/PR positive, ER strong staining intensity 100%, PR 40% positive, strong staining intensity, HER2 negative and KI of 10% status post left lumpectomy.  Final pathology showed invasive lobular carcinoma, grade 3 measuring 0.7 cm along with low-grade DCIS, resection margins negative, all sentinel lymph nodes negative for carcinoma. Oncotype testing was sent which showed recurrence score of 28, distant recurrence risk at 9 years with tamoxifen alone is 17% and chemotherapy benefit was greater than 15%.  She is here to discuss Oncotype results and role of adjuvant chemotherapy.  Given high Oncotype score and deemed chemotherapy benefit, I have discussed about considering adjuvant docetaxel and cyclophosphamide for 4 cycles.  We have discussed about the regimen, schedule every 21 days for 4 cycles, adverse effects of chemotherapy including but not limited to fatigue, nausea, vomiting, diarrhea, increased risk of infections, neuropathy, cytopenias.  She understands that some of the side effects can be life-threatening and permanent.  I do believe overall this regimen is well-tolerated.  After chemotherapy, she can proceed with adjuvant radiation which will be then followed by adjuvant antiestrogen therapy.  She would like to think about since her RS is score just over 26 and there may not be a  significant benefit in patients with recurrent score of 26.  I have clearly explained to her and her husband that chemotherapy will add benefit in addition to radiation and adjuvant antiestrogen therapy and reducing the risk of breast cancer.  She understands that when breast cancer returns, it could return at distant sites and be stage IV breast cancer which may not be curable.  She would like to think about it over the weekend and let us know.  If she decides to forego chemotherapy, she can proceed with adjuvant radiation followed by antiestrogen therapy.    No orders of the defined types were placed in this encounter.  We discussed that there is minimal absorption of estrogen from vaginal estrogens which can lead to questionable increase in breast cancer.  HISTORY OF PRESENTING ILLNESS:  DEEPIKA DECATUR 61 y.o. female is here because of invasive lobular carcinoma of the left breast.  This is a 61 year old female patient seen by Dr. Ninfa Linden for evaluation of breast cancer.  She had screening mammography which showed a 6 mm mass in the upper outer quadrant of left breast.  Biopsy showed an invasive lobular carcinoma with LCIS, grade 2.  Pathology showed ER 100%, positive, strong staining intensity, PR, 40% positive strong staining intensity, Ki of 10% and negative for HER2 1+ by IHC. She had a left breast lumpectomy which showed invasive lobular carcinoma, 0.7 cm, grade 3, DCIS, low-grade, negative resection margins, all sentinel lymph nodes negative for carcinoma.  Prognostics are not repeated. Final pathologic classification P T1b P N0 Oncotype score results are as 28, distant recurrence risk at 9 years with tamoxifen alone is 17% and chemotherapy benefit was deemed greater than 15%.  She is here to discuss Oncotype results  and to discuss additional recommendations. She is healing well otherwise.  She again has difficulty concentrating on the question, is not a great historian. She was very  worried about the reason for coming sooner than planned. Rest of the pertinent 10 point ROS reviewed and negative.  MEDICAL HISTORY:  Past Medical History:  Diagnosis Date   Allergy    Anxiety    Arthritis    Breast cancer ([place]) 07/19/2021   Chronic low back pain    Fibroid 03/30/2015   Frequent UTI    Hypertension    Vaginal bleeding 03/30/2015    SURGICAL HISTORY: Past Surgical History:  Procedure Laterality Date   BACK SURGERY  05-16-2015   BREAST LUMPECTOMY WITH RADIOACTIVE SEED AND SENTINEL LYMPH NODE BIOPSY Left 08/08/2021   Procedure: LEFT BREAST LUMPECTOMY WITH RADIOACTIVE SEED AND SENTINEL LYMPH NODE BIOPSY;  Surgeon: Coralie Keens, MD;  Location: [facility];  Service: General;  Laterality: Left;   KNEE SURGERY     left   KNEE SURGERY Left 07/08/2012   WISDOM TOOTH EXTRACTION      SOCIAL HISTORY: Social History   Socioeconomic History   Marital status: Married    Spouse name: Not on file   Number of children: Not on file   Years of education: Not on file   Highest education level: Not on file  Occupational History   Not on file  Tobacco Use   Smoking status: Never   Smokeless tobacco: Never  Vaping Use   Vaping Use: Never used  Substance and Sexual Activity   Alcohol use: Yes    Alcohol/week: 5.0 standard drinks    Types: 5 Cans of beer per week   Drug use: No   Sexual activity: Yes    Birth control/protection: Pill  Other Topics Concern   Not on file  Social History Narrative   Not on file   Social Determinants of Health   Financial Resource Strain: Not on file  Food Insecurity: Not on file  Transportation Needs: Not on file  Physical Activity: Not on file  Stress: Not on file  Social Connections: Not on file  Intimate Partner Violence: Not on file    FAMILY HISTORY: Family History  Problem Relation Age of Onset   Breast cancer Mother    Heart disease Father 78   Diabetes Father    Colon cancer Neg Hx    Colon polyps  Neg Hx     ALLERGIES:  is allergic to norvasc [amlodipine besylate].  MEDICATIONS:  Current Outpatient Medications  Medication Sig Dispense Refill   atenolol (TENORMIN) 25 MG tablet Take 1 tablet (25 mg total) by mouth daily. 90 tablet 2   atorvastatin (LIPITOR) 40 MG tablet Take 1 tablet (40 mg total) by mouth daily. 90 tablet 1   calcium-vitamin D (OSCAL WITH D) 250-125 MG-UNIT per tablet Take 1 tablet by mouth daily.     cyclobenzaprine (FLEXERIL) 10 MG tablet TAKE ONE TABLET BY MOUTH THREE TIMES A DAY AS NEEDED FOR MUSCLE SPASMS 60 tablet 2   diazepam (VALIUM) 5 MG tablet TAKE ONE TABLET BY MOUTH TWICE A DAY AS NEEDED FOR ANXIETY 60 tablet 0   diclofenac Sodium (VOLTAREN) 1 % GEL Apply 2 g topically 4 (four) times daily. 100 g 5   DULoxetine (CYMBALTA) 60 MG capsule Take 1 capsule (60 mg total) by mouth daily. 90 capsule 3   estradiol (ESTRACE) 0.1 MG/GM vaginal cream estradiol 0.01% (0.1 mg/gram) vaginal cream  INSERT 1 ML  VAGINALLY TWICE A WEEK     fish oil-omega-3 fatty acids 1000 MG capsule Take 2 g by mouth 2 (two) times daily.     losartan (COZAAR) 100 MG tablet Take 1 tablet (100 mg total) by mouth daily. 90 tablet 2   omeprazole (PRILOSEC) 20 MG capsule Take 1 capsule (20 mg total) by mouth daily. 90 capsule 3   traMADol (ULTRAM) 50 MG tablet Take 2 tablets (100 mg total) by mouth 2 (two) times daily. 120 tablet 0   traMADol (ULTRAM) 50 MG tablet Take 1-2 tablets (50-100 mg total) by mouth every 6 (six) hours as needed. 25 tablet 0   zolpidem (AMBIEN CR) 12.5 MG CR tablet TAKE ONE TABLET BY MOUTH EVERY NIGHT AT BEDTIME AS NEEDED FOR SLEEP 30 tablet 3   No current facility-administered medications for this visit.    PHYSICAL EXAMINATION: ECOG PERFORMANCE STATUS: 0 - Asymptomatic  Vitals:   08/29/21 1508  BP: 132/88  Pulse: 91  Resp: 16  Temp: (!) 97.5 F (36.4 C)  SpO2: 100%    Filed Weights   08/29/21 1508  Weight: 146 lb 8 oz (66.5 kg)    Breasts: Left breast  healing well from surgery, left axillary incision also appears well.  LABORATORY DATA:  I have reviewed the data as listed Lab Results  Component Value Date   WBC 6.0 04/06/2021   HGB 11.8 04/06/2021   HCT 35.1 04/06/2021   MCV 98.0 04/06/2021   PLT 290 04/06/2021     Chemistry      Component Value Date/Time   NA 139 04/06/2021 1449   K 4.7 04/06/2021 1449   CL 103 04/06/2021 1449   CO2 26 04/06/2021 1449   BUN 21 04/06/2021 1449   CREATININE 0.89 04/06/2021 1449      Component Value Date/Time   CALCIUM 9.8 04/06/2021 1449   ALKPHOS 56 10/24/2015 0850   AST 55 (H) 04/06/2021 1449   ALT 78 (H) 04/06/2021 1449   BILITOT 0.3 04/06/2021 1449       RADIOGRAPHIC STUDIES: I have personally reviewed the radiological images as listed and agreed with the findings in the report. MM Breast Surgical Specimen  Result Date: 08/08/2021 CLINICAL DATA:  Post left breast lumpectomy. EXAM: SPECIMEN RADIOGRAPH OF THE LEFT BREAST COMPARISON:  Previous exam(s). FINDINGS: Status post excision of the left breast. The radioactive seed and ribbon shaped biopsy marker clip are present, completely intact, and were marked for pathology. IMPRESSION: Specimen radiograph of the left breast. Electronically Signed   By: Everlean Alstrom M.D.   On: 08/08/2021 13:07  MM LT RADIOACTIVE SEED LOC MAMMO GUIDE  Result Date: 08/07/2021 CLINICAL DATA:  Patient presents for radioactive seed localization a left breast invasive lobular carcinoma prior to surgical excision. EXAM: MAMMOGRAPHIC GUIDED RADIOACTIVE SEED LOCALIZATION OF THE LEFT BREAST COMPARISON:  Previous exam(s). FINDINGS: Patient presents for radioactive seed localization prior to surgical excision. I met with the patient and we discussed the procedure of seed localization including benefits and alternatives. We discussed the high likelihood of a successful procedure. We discussed the risks of the procedure including infection, bleeding, tissue injury and  further surgery. We discussed the low dose of radioactivity involved in the procedure. Informed, written consent was given. The usual time-out protocol was performed immediately prior to the procedure. Using mammographic guidance, sterile technique, 1% lidocaine and an I-125 radioactive seed, the ribbon shaped biopsy clip was localized using a lateral approach. The follow-up mammogram images confirm the seed in the  expected location and were marked for Dr. Ninfa Linden. Follow-up survey of the patient confirms presence of the radioactive seed. Order number of I-125 seed:  [id number]. Total activity:  8.421 millicuries reference Date: 06/06/2021 The patient tolerated the procedure well and was released from the [facility]. She was given instructions regarding seed removal. IMPRESSION: Radioactive seed localization left breast. No apparent complications. Electronically Signed   By: Lajean Manes M.D.   On: 08/07/2021 13:49   All questions were answered. The patient knows to call the clinic with any problems, questions or concerns. I spent 40 minutes in the care of this patient including H and P, review of records, counseling and coordination of care as mentioned above.    Benay Pike, MD 08/29/2021 4:18 PM

## 2021-08-30 ENCOUNTER — Telehealth: Payer: Self-pay | Admitting: Hematology and Oncology

## 2021-08-30 ENCOUNTER — Encounter: Payer: Self-pay | Admitting: *Deleted

## 2021-08-30 NOTE — Telephone Encounter (Signed)
Scheduled appointments per 02/22 los. Was advised to hold off on scheduling treatments, patient does not know if that's what they want to do, will schedule at a later time.

## 2021-09-03 ENCOUNTER — Encounter: Payer: Self-pay | Admitting: Hematology and Oncology

## 2021-09-03 ENCOUNTER — Inpatient Hospital Stay (HOSPITAL_BASED_OUTPATIENT_CLINIC_OR_DEPARTMENT_OTHER): Payer: 59 | Admitting: Hematology and Oncology

## 2021-09-03 ENCOUNTER — Other Ambulatory Visit: Payer: Self-pay

## 2021-09-03 ENCOUNTER — Telehealth: Payer: Self-pay

## 2021-09-03 DIAGNOSIS — C50412 Malignant neoplasm of upper-outer quadrant of left female breast: Secondary | ICD-10-CM

## 2021-09-03 DIAGNOSIS — Z170 Estrogen receptor positive status [ER+]: Secondary | ICD-10-CM | POA: Diagnosis not present

## 2021-09-03 NOTE — Assessment & Plan Note (Signed)
This is a very pleasant 61 year old postmenopausal female patient with newly diagnosed invasive lobular carcinoma of the left breast, T1 N0 M0 grade, ER/PR positive, ER strong staining intensity 100%, PR 40% positive, strong staining intensity, HER2 negative and KI of 10% status post left lumpectomy.  Final pathology showed invasive lobular carcinoma, grade 3 measuring 0.7 cm along with low-grade DCIS, resection margins negative, all sentinel lymph nodes negative for carcinoma. Oncotype testing was sent which showed recurrence score of 28, distant recurrence risk at 9 years with tamoxifen alone is 17% and chemotherapy benefit was greater than 15%.   During her visit past week, we have discussed about role of adjuvant chemotherapy given the Oncotype score, we have discussed about TC regimen, schedule, adverse effects and benefits.  She wanted to think about it and she is here for telephone visit today.  She tells me that she has thought through this, given her age and some discussion with friends and family members, she does not want to proceed with chemotherapy.  She understands the role of chemotherapy, benefits from chemotherapy and she would like to not proceed with that.  She would like to proceed with adjuvant radiation.  She understands the importance of antiestrogen therapy. She will return to clinic with [place] after radiation to initiate antiestrogen therapy.

## 2021-09-03 NOTE — Progress Notes (Signed)
[facility] CONSULT NOTE  Patient Care Team: [name], MD as PCP - General (Family Medicine) Coralie Keens, MD as Consulting Physician (General Surgery) Mauro Kaufmann, RN as Oncology Nurse Navigator Rockwell [place], RN as Oncology Nurse Navigator  CHIEF COMPLAINTS/PURPOSE OF CONSULTATION:  [place] left breast  ASSESSMENT & PLAN:  Malignant neoplasm of upper-outer quadrant of left breast in female, estrogen receptor positive [facility]) This is a very pleasant 61 year old postmenopausal female patient with newly diagnosed invasive lobular carcinoma of the left breast, T1 N0 M0 grade, ER/PR positive, ER strong staining intensity 100%, PR 40% positive, strong staining intensity, HER2 negative and KI of 10% status post left lumpectomy.  Final pathology showed invasive lobular carcinoma, grade 3 measuring 0.7 cm along with low-grade DCIS, resection margins negative, all sentinel lymph nodes negative for carcinoma. Oncotype testing was sent which showed recurrence score of 28, distant recurrence risk at 9 years with tamoxifen alone is 17% and chemotherapy benefit was greater than 15%.   During her visit past week, we have discussed about role of adjuvant chemotherapy given the Oncotype score, we have discussed about TC regimen, schedule, adverse effects and benefits.  She wanted to think about it and she is here for telephone visit today.  She tells me that she has thought through this, given her age and some discussion with friends and family members, she does not want to proceed with chemotherapy.  She understands the role of chemotherapy, benefits from chemotherapy and she would like to not proceed with that.  She would like to proceed with adjuvant radiation.  She understands the importance of antiestrogen therapy. She will return to clinic with [place] after radiation to initiate antiestrogen therapy.  HISTORY OF PRESENTING ILLNESS:  Erika Harvey 61 y.o. female is here because  of invasive lobular carcinoma of the left breast.  This is a 61 year old female patient seen by Dr. Ninfa Linden for evaluation of breast cancer.  She had screening mammography which showed a 6 mm mass in the upper outer quadrant of left breast.  Biopsy showed an invasive lobular carcinoma with LCIS, grade 2.  Pathology showed ER 100%, positive, strong staining intensity, PR, 40% positive strong staining intensity, Ki of 10% and negative for HER2 1+ by IHC. She had a left breast lumpectomy which showed invasive lobular carcinoma, 0.7 cm, grade 3, DCIS, low-grade, negative resection margins, all sentinel lymph nodes negative for carcinoma.  Prognostics are not repeated. Final pathologic classification P T1b P N0 Oncotype score results are as 28, distant recurrence risk at 9 years with tamoxifen alone is 17% and chemotherapy benefit was deemed greater than 15%.During her last visit, we have discussed about added benefit of chemotherapy given her Oncotype score.  She wanted to think about it.  Today we have schedule a telephone visit to discuss about her decision.  She tells me that she has thought through this.  Given her age, small benefit and because she has talked to some friends who had bad experience with chemotherapy, understanding fully the benefit of added chemotherapy, she does not want to proceed with it.  She has an appointment with the radiation oncologist Dr. Lisbeth Renshaw and she will be proceed with radiation followed by antiestrogen therapy.  She denies any other new symptoms.  Rest of the pertinent 10 point ROS reviewed and negative.  MEDICAL HISTORY:  Past Medical History:  Diagnosis Date   Allergy    Anxiety    Arthritis    Breast cancer (  [place]) 07/19/2021   Chronic low back pain    Fibroid 03/30/2015   Frequent UTI    Hypertension    Vaginal bleeding 03/30/2015    SURGICAL HISTORY: Past Surgical History:  Procedure Laterality Date   BACK SURGERY  05-16-2015   BREAST LUMPECTOMY WITH  RADIOACTIVE SEED AND SENTINEL LYMPH NODE BIOPSY Left 08/08/2021   Procedure: LEFT BREAST LUMPECTOMY WITH RADIOACTIVE SEED AND SENTINEL LYMPH NODE BIOPSY;  Surgeon: Coralie Keens, MD;  Location: [place];  Service: General;  Laterality: Left;   KNEE SURGERY     left   KNEE SURGERY Left 07/08/2012   WISDOM TOOTH EXTRACTION      SOCIAL HISTORY: Social History   Socioeconomic History   Marital status: Married    Spouse name: Not on file   Number of children: Not on file   Years of education: Not on file   Highest education level: Not on file  Occupational History   Not on file  Tobacco Use   Smoking status: Never   Smokeless tobacco: Never  Vaping Use   Vaping Use: Never used  Substance and Sexual Activity   Alcohol use: Yes    Alcohol/week: 5.0 standard drinks    Types: 5 Cans of beer per week   Drug use: No   Sexual activity: Yes    Birth control/protection: Pill  Other Topics Concern   Not on file  Social History Narrative   Not on file   Social Determinants of Health   Financial Resource Strain: Not on file  Food Insecurity: Not on file  Transportation Needs: Not on file  Physical Activity: Not on file  Stress: Not on file  Social Connections: Not on file  Intimate Partner Violence: Not on file    FAMILY HISTORY: Family History  Problem Relation Age of Onset   Breast cancer Mother    Heart disease Father 23   Diabetes Father    Colon cancer Neg Hx    Colon polyps Neg Hx     ALLERGIES:  is allergic to norvasc [amlodipine besylate].  MEDICATIONS:  Current Outpatient Medications  Medication Sig Dispense Refill   atenolol (TENORMIN) 25 MG tablet Take 1 tablet (25 mg total) by mouth daily. 90 tablet 2   atorvastatin (LIPITOR) 40 MG tablet Take 1 tablet (40 mg total) by mouth daily. 90 tablet 1   calcium-vitamin D (OSCAL WITH D) 250-125 MG-UNIT per tablet Take 1 tablet by mouth daily.     cyclobenzaprine (FLEXERIL) 10 MG tablet TAKE ONE  TABLET BY MOUTH THREE TIMES A DAY AS NEEDED FOR MUSCLE SPASMS 60 tablet 2   diazepam (VALIUM) 5 MG tablet TAKE ONE TABLET BY MOUTH TWICE A DAY AS NEEDED FOR ANXIETY 60 tablet 0   diclofenac Sodium (VOLTAREN) 1 % GEL Apply 2 g topically 4 (four) times daily. 100 g 5   DULoxetine (CYMBALTA) 60 MG capsule Take 1 capsule (60 mg total) by mouth daily. 90 capsule 3   estradiol (ESTRACE) 0.1 MG/GM vaginal cream estradiol 0.01% (0.1 mg/gram) vaginal cream  INSERT 1 ML VAGINALLY TWICE A WEEK     fish oil-omega-3 fatty acids 1000 MG capsule Take 2 g by mouth 2 (two) times daily.     losartan (COZAAR) 100 MG tablet Take 1 tablet (100 mg total) by mouth daily. 90 tablet 2   omeprazole (PRILOSEC) 20 MG capsule Take 1 capsule (20 mg total) by mouth daily. 90 capsule 3   traMADol (ULTRAM) 50 MG tablet Take  2 tablets (100 mg total) by mouth 2 (two) times daily. 120 tablet 0   traMADol (ULTRAM) 50 MG tablet Take 1-2 tablets (50-100 mg total) by mouth every 6 (six) hours as needed. 25 tablet 0   zolpidem (AMBIEN CR) 12.5 MG CR tablet TAKE ONE TABLET BY MOUTH EVERY NIGHT AT BEDTIME AS NEEDED FOR SLEEP 30 tablet 3   No current facility-administered medications for this visit.    PHYSICAL EXAMINATION: ECOG PERFORMANCE STATUS: 0 - Asymptomatic  There were no vitals filed for this visit. Vital signs and physical examination not done, telephone visit.  LABORATORY DATA:  I have reviewed the data as listed Lab Results  Component Value Date   WBC 6.0 04/06/2021   HGB 11.8 04/06/2021   HCT 35.1 04/06/2021   MCV 98.0 04/06/2021   PLT 290 04/06/2021     Chemistry      Component Value Date/Time   NA 139 04/06/2021 1449   K 4.7 04/06/2021 1449   CL 103 04/06/2021 1449   CO2 26 04/06/2021 1449   BUN 21 04/06/2021 1449   CREATININE 0.89 04/06/2021 1449      Component Value Date/Time   CALCIUM 9.8 04/06/2021 1449   ALKPHOS 56 10/24/2015 0850   AST 55 (H) 04/06/2021 1449   ALT 78 (H) 04/06/2021 1449    BILITOT 0.3 04/06/2021 1449       RADIOGRAPHIC STUDIES: I have personally reviewed the radiological images as listed and agreed with the findings in the report. MM Breast Surgical Specimen  Result Date: 08/08/2021 CLINICAL DATA:  Post left breast lumpectomy. EXAM: SPECIMEN RADIOGRAPH OF THE LEFT BREAST COMPARISON:  Previous exam(s). FINDINGS: Status post excision of the left breast. The radioactive seed and ribbon shaped biopsy marker clip are present, completely intact, and were marked for pathology. IMPRESSION: Specimen radiograph of the left breast. Electronically Signed   By: Everlean Alstrom M.D.   On: 08/08/2021 13:07  MM LT RADIOACTIVE SEED LOC MAMMO GUIDE  Result Date: 08/07/2021 CLINICAL DATA:  Patient presents for radioactive seed localization a left breast invasive lobular carcinoma prior to surgical excision. EXAM: MAMMOGRAPHIC GUIDED RADIOACTIVE SEED LOCALIZATION OF THE LEFT BREAST COMPARISON:  Previous exam(s). FINDINGS: Patient presents for radioactive seed localization prior to surgical excision. I met with the patient and we discussed the procedure of seed localization including benefits and alternatives. We discussed the high likelihood of a successful procedure. We discussed the risks of the procedure including infection, bleeding, tissue injury and further surgery. We discussed the low dose of radioactivity involved in the procedure. Informed, written consent was given. The usual time-out protocol was performed immediately prior to the procedure. Using mammographic guidance, sterile technique, 1% lidocaine and an I-125 radioactive seed, the ribbon shaped biopsy clip was localized using a lateral approach. The follow-up mammogram images confirm the seed in the expected location and were marked for Dr. Ninfa Linden. Follow-up survey of the patient confirms presence of the radioactive seed. Order number of I-125 seed:  [id number]. Total activity:  9.826 millicuries reference Date: 06/06/2021  The patient tolerated the procedure well and was released from the [place]. She was given instructions regarding seed removal. IMPRESSION: Radioactive seed localization left breast. No apparent complications. Electronically Signed   By: Lajean Manes M.D.   On: 08/07/2021 13:49   All questions were answered. The patient knows to call the clinic with any problems, questions or concerns. I spent 10 minutes in the care of this patient including H and P, review of  records, counseling and coordination of care as mentioned above.  I connected with  Lorrin Mais on 09/03/21 by a telephone application and verified that I am speaking with the correct person using two identifiers.   I discussed the limitations of evaluation and management by telemedicine. The patient expressed understanding and agreed to proceed.     Benay Pike, MD 09/03/2021 12:46 PM

## 2021-09-03 NOTE — Telephone Encounter (Signed)
LOV 04/06/21 Last refill 08/07/21, #120, 0 refills (picked up on 08/15/21)  Patient also received #25 on 08/08/21 from Dr. Ninfa Linden, per pharmacy both rx's were filled/picked up.  Please review, thanks!

## 2021-09-03 NOTE — Progress Notes (Signed)
Radiation Oncology         [phone] ________________________________  Name: Erika Harvey        MRN: [id number]  Date of Service: 09/06/2021 DOB: 29-Jun-1961  WU:[id number], Cammie Mcgee, MD  Benay Pike, MD     REFERRING PHYSICIAN: Benay Pike, MD   DIAGNOSIS: The encounter diagnosis was Malignant neoplasm of upper-outer quadrant of left breast in female, estrogen receptor positive ([place]).   HISTORY OF PRESENT ILLNESS: Erika Harvey is a 61 y.o. female with a newly diagnosed left breast cancer. The patient was found to have a screening detected mass in the left breast in the upper outer quadrant at 2:30 measuring up to 6 mm and no left axillary adenopathy. 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   Since her last visit, she has undergone left lumpectomy and sentinel node biopsy on 08/08/21 which showed a grade 3 invasive lobular carcinoma measuring 7 mm with associated DCIS. Her margins were negative as well as 4 negative sampled left axillary nodes. She had Oncotype Dx testing and her score was 28. She was offered systemic chemotherapy, but after careful consideration, she has decided to forgo this. She's seen today to discuss adjuvant radiotherapy.    PREVIOUS RADIATION THERAPY: No   PAST MEDICAL HISTORY:  Past Medical History:  Diagnosis Date   Allergy    Anxiety    Arthritis    Breast cancer ([place]) 07/19/2021   Chronic low back pain    Fibroid 03/30/2015   Frequent UTI    Hypertension    Vaginal bleeding 03/30/2015       PAST SURGICAL HISTORY: Past Surgical History:  Procedure Laterality Date   BACK SURGERY  05-16-2015   BREAST LUMPECTOMY WITH RADIOACTIVE SEED AND SENTINEL LYMPH NODE BIOPSY Left 08/08/2021   Procedure: LEFT BREAST LUMPECTOMY WITH RADIOACTIVE SEED AND SENTINEL LYMPH NODE BIOPSY;  Surgeon: Coralie Keens, MD;   Location: [facility];  Service: General;  Laterality: Left;   KNEE SURGERY     left   KNEE SURGERY Left 07/08/2012   WISDOM TOOTH EXTRACTION       FAMILY HISTORY:  Family History  Problem Relation Age of Onset   Breast cancer Mother    Heart disease Father 25   Diabetes Father    Colon cancer Neg Hx    Colon polyps Neg Hx      SOCIAL HISTORY:  reports that she has never smoked. She has never used smokeless tobacco. She reports current alcohol use of about 5.0 standard drinks per week. She reports that she does not use drugs. She is married and lives in [place]. She is retired and enjoys time at home with her husband and their dogs. She does not have any children, and is an only child.   ALLERGIES: Norvasc [amlodipine besylate]   MEDICATIONS:  Current Outpatient Medications  Medication Sig Dispense Refill   atenolol (TENORMIN) 25 MG tablet Take 1 tablet (25 mg total) by mouth daily. 90 tablet 2   atorvastatin (LIPITOR) 40 MG tablet Take 1 tablet (40 mg total) by mouth daily. 90 tablet 1   calcium-vitamin D (OSCAL WITH D) 250-125 MG-UNIT per tablet Take 1 tablet by mouth daily.     cyclobenzaprine (FLEXERIL) 10 MG tablet TAKE ONE TABLET BY MOUTH THREE TIMES A DAY AS NEEDED FOR MUSCLE SPASMS 60 tablet 2  diazepam (VALIUM) 5 MG tablet TAKE ONE TABLET BY MOUTH TWICE A DAY AS NEEDED FOR ANXIETY 60 tablet 0   diclofenac Sodium (VOLTAREN) 1 % GEL Apply 2 g topically 4 (four) times daily. 100 g 5   DULoxetine (CYMBALTA) 60 MG capsule Take 1 capsule (60 mg total) by mouth daily. 90 capsule 3   estradiol (ESTRACE) 0.1 MG/GM vaginal cream estradiol 0.01% (0.1 mg/gram) vaginal cream  INSERT 1 ML VAGINALLY TWICE A WEEK     fish oil-omega-3 fatty acids 1000 MG capsule Take 2 g by mouth 2 (two) times daily.     losartan (COZAAR) 100 MG tablet Take 1 tablet (100 mg total) by mouth daily. 90 tablet 2   omeprazole (PRILOSEC) 20 MG capsule Take 1 capsule (20 mg total) by mouth  daily. 90 capsule 3   traMADol (ULTRAM) 50 MG tablet Take 2 tablets (100 mg total) by mouth 2 (two) times daily. 120 tablet 0   traMADol (ULTRAM) 50 MG tablet Take 1-2 tablets (50-100 mg total) by mouth every 6 (six) hours as needed. 25 tablet 0   zolpidem (AMBIEN CR) 12.5 MG CR tablet TAKE ONE TABLET BY MOUTH EVERY NIGHT AT BEDTIME AS NEEDED FOR SLEEP 30 tablet 3   No current facility-administered medications for this visit.     REVIEW OF SYSTEMS: On review of systems, the patient reports that she is doing well. She has had some soreness of her breast but no other complaints are verbalized.      PHYSICAL EXAM:  Wt Readings from Last 3 Encounters:  08/29/21 146 lb 8 oz (66.5 kg)  08/08/21 145 lb 15.1 oz (66.2 kg)  07/31/21 147 lb 9.6 oz (67 kg)   Temp Readings from Last 3 Encounters:  08/29/21 (!) 97.5 F (36.4 C) (Temporal)  08/08/21 98.1 F (36.7 C) (Oral)  07/31/21 97.6 F (36.4 C)   BP Readings from Last 3 Encounters:  08/29/21 132/88  08/08/21 (!) 129/97  07/31/21 107/77   Pulse Readings from Last 3 Encounters:  08/29/21 91  08/08/21 72  07/31/21 93    In general this is a well appearing caucasian female in no acute distress. She's alert and oriented x4 and appropriate throughout the examination. Cardiopulmonary assessment is negative for acute distress and she exhibits normal effort. Her left breast revealed a well healed surgical site of the breast and axilla without erythema, separation, or drainage.     ECOG = 1  0 - Asymptomatic (Fully active, able to carry on all predisease activities without restriction)  1 - Symptomatic but completely ambulatory (Restricted in physically strenuous activity but ambulatory and able to carry out work of a light or sedentary nature. For example, light housework, office work)  2 - Symptomatic, <50% in bed during the day (Ambulatory and capable of all self care but unable to carry out any work activities. Up and about more than  50% of waking hours)  3 - Symptomatic, >50% in bed, but not bedbound (Capable of only limited self-care, confined to bed or chair 50% or more of waking hours)  4 - Bedbound (Completely disabled. Cannot carry on any self-care. Totally confined to bed or chair)  5 - Death   Eustace Pen MM, Creech RH, Tormey DC, et al. [phone]). "Toxicity and response criteria of the [facility]". [place] Oncol. 5 (6): 649-55    LABORATORY DATA:  Lab Results  Component Value Date   WBC 6.0 04/06/2021   HGB 11.8 04/06/2021  HCT 35.1 04/06/2021   MCV 98.0 04/06/2021   PLT 290 04/06/2021   Lab Results  Component Value Date   NA 139 04/06/2021   K 4.7 04/06/2021   CL 103 04/06/2021   CO2 26 04/06/2021   Lab Results  Component Value Date   ALT 78 (H) 04/06/2021   AST 55 (H) 04/06/2021   ALKPHOS 56 10/24/2015   BILITOT 0.3 04/06/2021      RADIOGRAPHY: MM Breast Surgical Specimen  Result Date: 08/08/2021 CLINICAL DATA:  Post left breast lumpectomy. EXAM: SPECIMEN RADIOGRAPH OF THE LEFT BREAST COMPARISON:  Previous exam(s). FINDINGS: Status post excision of the left breast. The radioactive seed and ribbon shaped biopsy marker clip are present, completely intact, and were marked for pathology. IMPRESSION: Specimen radiograph of the left breast. Electronically Signed   By: Everlean Alstrom M.D.   On: 08/08/2021 13:07  MM LT RADIOACTIVE SEED LOC MAMMO GUIDE  Result Date: 08/07/2021 CLINICAL DATA:  Patient presents for radioactive seed localization a left breast invasive lobular carcinoma prior to surgical excision. EXAM: MAMMOGRAPHIC GUIDED RADIOACTIVE SEED LOCALIZATION OF THE LEFT BREAST COMPARISON:  Previous exam(s). FINDINGS: Patient presents for radioactive seed localization prior to surgical excision. I met with the patient and we discussed the procedure of seed localization including benefits and alternatives. We discussed the high likelihood of a successful procedure. We  discussed the risks of the procedure including infection, bleeding, tissue injury and further surgery. We discussed the low dose of radioactivity involved in the procedure. Informed, written consent was given. The usual time-out protocol was performed immediately prior to the procedure. Using mammographic guidance, sterile technique, 1% lidocaine and an I-125 radioactive seed, the ribbon shaped biopsy clip was localized using a lateral approach. The follow-up mammogram images confirm the seed in the expected location and were marked for Dr. Ninfa Linden. Follow-up survey of the patient confirms presence of the radioactive seed. Order number of I-125 seed:  [id number]. Total activity:  9.977 millicuries reference Date: 06/06/2021 The patient tolerated the procedure well and was released from the [facility]. She was given instructions regarding seed removal. IMPRESSION: Radioactive seed localization left breast. No apparent complications. Electronically Signed   By: Lajean Manes M.D.   On: 08/07/2021 13:49      IMPRESSION/PLAN: 1. 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 Dr. Lisbeth Renshaw has reviewed her case, and I discussed the final pathology findings and we reviewed the nature of early stage left breast disease. She has done well since surgery, and has decided to forgo chemotherapy. Dr. Lisbeth Renshaw does still recommend external radiotherapy to the breast  to reduce risks of local recurrence followed by antiestrogen therapy. We discussed the risks, benefits, short, and long term effects of radiotherapy, as well as the curative intent, and the patient is interested in proceeding. I reviewed the delivery and logistics of radiotherapy and Dr. Lisbeth Renshaw recommends 4  weeks of radiotherapy to the left breast with deep inspiration breath hold technique. Written consent is obtained and placed in the chart, a copy was provided to the patient. She will proceed with simulation today.        In a visit lasting 45 minutes, greater than 50% of the time was spent face to face reviewing her case, as well as in preparation of, discussing, and coordinating the patient's care.       Carola Rhine, [facility]    **Disclaimer: This note was dictated with voice recognition software. Similar sounding words  can inadvertently be transcribed and this note may contain transcription errors which may not have been corrected upon publication of note.**

## 2021-09-03 NOTE — Telephone Encounter (Signed)
Spoke w/ patient, verified identity, and reminded patient of her 8:00am-09/06/21 in-person appointment w/ Shona Simpson PA-C. I left my extension [phone] in case patient needs anything.

## 2021-09-04 ENCOUNTER — Inpatient Hospital Stay: Payer: 59

## 2021-09-04 ENCOUNTER — Encounter: Payer: Self-pay | Admitting: *Deleted

## 2021-09-05 ENCOUNTER — Other Ambulatory Visit: Payer: Self-pay

## 2021-09-05 MED ORDER — TRAMADOL HCL 50 MG PO TABS: 100.0000 mg | ORAL_TABLET | Freq: Two times a day (BID) | ORAL | 0 refills | Status: AC

## 2021-09-05 NOTE — Telephone Encounter (Signed)
LOV 04/06/21 ?Last refill 08/07/21, #120, 0 refills ? ?Please review, thanks! ?

## 2021-09-06 ENCOUNTER — Ambulatory Visit
Admission: RE | Admit: 2021-09-06 | Discharge: 2021-09-06 | Disposition: A | Discharge status: Home / Self Care | Payer: 59 | Source: Ambulatory Visit | Attending: Radiation Oncology | Admitting: Radiation Oncology

## 2021-09-06 ENCOUNTER — Encounter: Payer: Self-pay | Admitting: Radiation Oncology

## 2021-09-06 ENCOUNTER — Other Ambulatory Visit: Payer: Self-pay

## 2021-09-06 VITALS — BP 123/92 | HR 94 | Temp 98.2°F | Resp 20 | Ht 63.0 in | Wt 142.8 lb

## 2021-09-06 DIAGNOSIS — Z170 Estrogen receptor positive status [ER+]: Secondary | ICD-10-CM

## 2021-09-06 DIAGNOSIS — F419 Anxiety disorder, unspecified: Secondary | ICD-10-CM | POA: Insufficient documentation

## 2021-09-06 DIAGNOSIS — Z79899 Other long term (current) drug therapy: Secondary | ICD-10-CM | POA: Insufficient documentation

## 2021-09-06 DIAGNOSIS — Z803 Family history of malignant neoplasm of breast: Secondary | ICD-10-CM | POA: Insufficient documentation

## 2021-09-06 DIAGNOSIS — I10 Essential (primary) hypertension: Secondary | ICD-10-CM | POA: Insufficient documentation

## 2021-09-06 DIAGNOSIS — Z510 Encounter for antineoplastic radiation therapy: Secondary | ICD-10-CM | POA: Diagnosis not present

## 2021-09-06 DIAGNOSIS — C50412 Malignant neoplasm of upper-outer quadrant of left female breast: Secondary | ICD-10-CM | POA: Insufficient documentation

## 2021-09-06 DIAGNOSIS — Z87442 Personal history of urinary calculi: Secondary | ICD-10-CM | POA: Insufficient documentation

## 2021-09-06 NOTE — Progress Notes (Signed)
Spoke w/ patient, verified identity, and began nursing interview w/ spouse Arantza Darrington in attendance. Patient reports mild LT breast discomfort 3/10. No other issues reported at this time. ? ?Meaningful use complete. ?Postmenopausal- No chances of pregnancy. ? ?BP (!) 123/92 (BP Location: Right Arm, Patient Position: Sitting, Cuff Size: Normal)   Pulse 94   Temp 98.2 ?F (36.8 ?C)   Resp 20   Ht 5\' 3"  (1.6 m)   Wt 142 lb 12.8 oz (64.8 kg)   LMP 03/23/2015   SpO2 100%   BMI 25.30 kg/m?  ? ?

## 2021-09-07 ENCOUNTER — Other Ambulatory Visit: Payer: Self-pay | Admitting: Family Medicine

## 2021-09-07 DIAGNOSIS — F419 Anxiety disorder, unspecified: Secondary | ICD-10-CM

## 2021-09-07 NOTE — Telephone Encounter (Signed)
Is this okay to refill  ?  ?Lasted filled on 08/13/21 ? Lasted ov 04/06/21 ?

## 2021-09-10 ENCOUNTER — Other Ambulatory Visit: Payer: 59

## 2021-09-10 ENCOUNTER — Ambulatory Visit: Payer: 59

## 2021-09-11 NOTE — Telephone Encounter (Signed)
Patient called to also request refills of  ? ?cyclobenzaprine (FLEXERIL) 10 MG tablet [[id number]]  ? ?traMADol (ULTRAM) 50 MG tablet [[id number]]  ? ?Pharmacy confirmed as ? ?[facility] [id number] [place]  ?[place], [address] [place] [address]  ?Phone:  [phone]  Fax:  [phone]  ? ?Please advise at [phone]. ?

## 2021-09-12 DIAGNOSIS — Z510 Encounter for antineoplastic radiation therapy: Secondary | ICD-10-CM | POA: Diagnosis not present

## 2021-09-12 DIAGNOSIS — Z170 Estrogen receptor positive status [ER+]: Secondary | ICD-10-CM | POA: Diagnosis not present

## 2021-09-12 DIAGNOSIS — C50412 Malignant neoplasm of upper-outer quadrant of left female breast: Secondary | ICD-10-CM | POA: Diagnosis not present

## 2021-09-12 NOTE — Telephone Encounter (Signed)
Tramadol sent 09/05/21 ? ?Flexeril sent 08/08/21, #60, 2 refills. This rx is not due for refills at this time.  ?

## 2021-09-13 ENCOUNTER — Other Ambulatory Visit: Payer: Self-pay

## 2021-09-13 ENCOUNTER — Encounter: Payer: Self-pay | Admitting: *Deleted

## 2021-09-13 ENCOUNTER — Ambulatory Visit
Admission: RE | Admit: 2021-09-13 | Discharge: 2021-09-13 | Disposition: A | Discharge status: Home / Self Care | Payer: 59 | Source: Ambulatory Visit | Attending: Radiation Oncology | Admitting: Radiation Oncology

## 2021-09-13 DIAGNOSIS — Z510 Encounter for antineoplastic radiation therapy: Secondary | ICD-10-CM | POA: Diagnosis not present

## 2021-09-13 DIAGNOSIS — Z170 Estrogen receptor positive status [ER+]: Secondary | ICD-10-CM | POA: Diagnosis not present

## 2021-09-13 DIAGNOSIS — C50412 Malignant neoplasm of upper-outer quadrant of left female breast: Secondary | ICD-10-CM | POA: Diagnosis not present

## 2021-09-14 ENCOUNTER — Telehealth: Payer: Self-pay

## 2021-09-14 ENCOUNTER — Ambulatory Visit
Admission: RE | Admit: 2021-09-14 | Discharge: 2021-09-14 | Disposition: A | Discharge status: Home / Self Care | Payer: 59 | Source: Ambulatory Visit | Attending: Radiation Oncology | Admitting: Radiation Oncology

## 2021-09-14 DIAGNOSIS — Z170 Estrogen receptor positive status [ER+]: Secondary | ICD-10-CM | POA: Diagnosis not present

## 2021-09-14 DIAGNOSIS — Z510 Encounter for antineoplastic radiation therapy: Secondary | ICD-10-CM | POA: Diagnosis not present

## 2021-09-14 DIAGNOSIS — C50412 Malignant neoplasm of upper-outer quadrant of left female breast: Secondary | ICD-10-CM | POA: Diagnosis not present

## 2021-09-14 NOTE — Telephone Encounter (Signed)
Pt called in requesting if this med zolpidem (AMBIEN CR) 12.5 MG CR could be changed to a regular dosage and not the time released. Please advise ? ?Cb#: [phone] ? ? ?

## 2021-09-14 NOTE — Progress Notes (Signed)
Pt here for patient teaching.  Pt given Radiation and You booklet, skin care instructions, and Radiaplex.  Patient was advised to obtain over the counter aluminum free deodorant.  Reviewed areas of pertinence such as fatigue, hair loss, skin changes, breast tenderness, and breast swelling. Pt able to give teach back of to pat skin and use unscented/gentle soap,apply Radiaplex bid, avoid applying anything to skin within 4 hours of treatment, avoid wearing an under wire bra, and to use an electric razor if they must shave. Pt verbalizes understanding of information given and will contact nursing with any questions or concerns.   ?

## 2021-09-17 ENCOUNTER — Ambulatory Visit
Admission: RE | Admit: 2021-09-17 | Discharge: 2021-09-17 | Disposition: A | Discharge status: Home / Self Care | Payer: 59 | Source: Ambulatory Visit | Attending: Radiation Oncology | Admitting: Radiation Oncology

## 2021-09-17 ENCOUNTER — Other Ambulatory Visit: Payer: Self-pay | Admitting: Family Medicine

## 2021-09-17 ENCOUNTER — Other Ambulatory Visit: Payer: Self-pay

## 2021-09-17 ENCOUNTER — Telehealth: Payer: Self-pay | Admitting: Hematology and Oncology

## 2021-09-17 DIAGNOSIS — Z170 Estrogen receptor positive status [ER+]: Secondary | ICD-10-CM | POA: Diagnosis not present

## 2021-09-17 DIAGNOSIS — Z510 Encounter for antineoplastic radiation therapy: Secondary | ICD-10-CM | POA: Diagnosis not present

## 2021-09-17 DIAGNOSIS — C50412 Malignant neoplasm of upper-outer quadrant of left female breast: Secondary | ICD-10-CM | POA: Diagnosis not present

## 2021-09-17 MED ORDER — ZOLPIDEM TARTRATE 10 MG PO TABS: 10.0000 mg | ORAL_TABLET | Freq: Every evening | ORAL | 1 refills | Status: DC | PRN

## 2021-09-17 NOTE — Telephone Encounter (Signed)
.  Called patient to schedule appointment per 3/9 inbasket, patient is aware of date and time.   ?

## 2021-09-18 ENCOUNTER — Ambulatory Visit
Admission: RE | Admit: 2021-09-18 | Discharge: 2021-09-18 | Disposition: A | Discharge status: Home / Self Care | Payer: 59 | Source: Ambulatory Visit | Attending: Radiation Oncology | Admitting: Radiation Oncology

## 2021-09-18 DIAGNOSIS — C50412 Malignant neoplasm of upper-outer quadrant of left female breast: Secondary | ICD-10-CM | POA: Diagnosis not present

## 2021-09-18 DIAGNOSIS — Z170 Estrogen receptor positive status [ER+]: Secondary | ICD-10-CM | POA: Diagnosis not present

## 2021-09-18 DIAGNOSIS — Z510 Encounter for antineoplastic radiation therapy: Secondary | ICD-10-CM | POA: Diagnosis not present

## 2021-09-19 ENCOUNTER — Ambulatory Visit
Admission: RE | Admit: 2021-09-19 | Discharge: 2021-09-19 | Disposition: A | Discharge status: Home / Self Care | Payer: 59 | Source: Ambulatory Visit | Attending: Radiation Oncology | Admitting: Radiation Oncology

## 2021-09-19 ENCOUNTER — Other Ambulatory Visit: Payer: Self-pay

## 2021-09-19 DIAGNOSIS — Z170 Estrogen receptor positive status [ER+]: Secondary | ICD-10-CM | POA: Diagnosis not present

## 2021-09-19 DIAGNOSIS — Z510 Encounter for antineoplastic radiation therapy: Secondary | ICD-10-CM | POA: Diagnosis not present

## 2021-09-19 DIAGNOSIS — C50412 Malignant neoplasm of upper-outer quadrant of left female breast: Secondary | ICD-10-CM | POA: Diagnosis not present

## 2021-09-20 ENCOUNTER — Encounter (HOSPITAL_COMMUNITY): Payer: Self-pay

## 2021-09-20 ENCOUNTER — Other Ambulatory Visit: Payer: Self-pay

## 2021-09-20 ENCOUNTER — Ambulatory Visit
Admission: RE | Admit: 2021-09-20 | Discharge: 2021-09-20 | Disposition: A | Discharge status: Home / Self Care | Payer: 59 | Source: Ambulatory Visit | Attending: Radiation Oncology | Admitting: Radiation Oncology

## 2021-09-20 DIAGNOSIS — Z170 Estrogen receptor positive status [ER+]: Secondary | ICD-10-CM | POA: Diagnosis not present

## 2021-09-20 DIAGNOSIS — Z510 Encounter for antineoplastic radiation therapy: Secondary | ICD-10-CM | POA: Diagnosis not present

## 2021-09-20 DIAGNOSIS — C50412 Malignant neoplasm of upper-outer quadrant of left female breast: Secondary | ICD-10-CM | POA: Diagnosis not present

## 2021-09-21 ENCOUNTER — Telehealth: Payer: Self-pay

## 2021-09-21 ENCOUNTER — Ambulatory Visit
Admission: RE | Admit: 2021-09-21 | Discharge: 2021-09-21 | Disposition: A | Discharge status: Home / Self Care | Payer: 59 | Source: Ambulatory Visit | Attending: Radiation Oncology | Admitting: Radiation Oncology

## 2021-09-21 DIAGNOSIS — C50412 Malignant neoplasm of upper-outer quadrant of left female breast: Secondary | ICD-10-CM | POA: Diagnosis not present

## 2021-09-21 DIAGNOSIS — Z510 Encounter for antineoplastic radiation therapy: Secondary | ICD-10-CM | POA: Diagnosis not present

## 2021-09-21 DIAGNOSIS — Z170 Estrogen receptor positive status [ER+]: Secondary | ICD-10-CM | POA: Diagnosis not present

## 2021-09-21 NOTE — Telephone Encounter (Signed)
PA for Ambien started via CoverMyMeds ? ?[facility] - Rx #: [id number] ?

## 2021-09-24 ENCOUNTER — Other Ambulatory Visit: Payer: Self-pay

## 2021-09-24 ENCOUNTER — Ambulatory Visit
Admission: RE | Admit: 2021-09-24 | Discharge: 2021-09-24 | Disposition: A | Discharge status: Home / Self Care | Payer: 59 | Source: Ambulatory Visit | Attending: Radiation Oncology | Admitting: Radiation Oncology

## 2021-09-24 DIAGNOSIS — Z170 Estrogen receptor positive status [ER+]: Secondary | ICD-10-CM | POA: Diagnosis not present

## 2021-09-24 DIAGNOSIS — Z510 Encounter for antineoplastic radiation therapy: Secondary | ICD-10-CM | POA: Diagnosis not present

## 2021-09-24 DIAGNOSIS — C50412 Malignant neoplasm of upper-outer quadrant of left female breast: Secondary | ICD-10-CM | POA: Diagnosis not present

## 2021-09-24 NOTE — Telephone Encounter (Signed)
Approvedon March 17 ?Your PA request has been approved.  ? ?09/21/21-09/21/24 ? ? ?

## 2021-09-25 ENCOUNTER — Ambulatory Visit
Admission: RE | Admit: 2021-09-25 | Discharge: 2021-09-25 | Disposition: A | Discharge status: Home / Self Care | Payer: 59 | Source: Ambulatory Visit | Attending: Radiation Oncology | Admitting: Radiation Oncology

## 2021-09-25 DIAGNOSIS — Z510 Encounter for antineoplastic radiation therapy: Secondary | ICD-10-CM | POA: Diagnosis not present

## 2021-09-25 DIAGNOSIS — Z170 Estrogen receptor positive status [ER+]: Secondary | ICD-10-CM | POA: Diagnosis not present

## 2021-09-25 DIAGNOSIS — C50412 Malignant neoplasm of upper-outer quadrant of left female breast: Secondary | ICD-10-CM | POA: Diagnosis not present

## 2021-09-26 ENCOUNTER — Other Ambulatory Visit: Payer: Self-pay

## 2021-09-26 ENCOUNTER — Ambulatory Visit
Admission: RE | Admit: 2021-09-26 | Discharge: 2021-09-26 | Disposition: A | Discharge status: Home / Self Care | Payer: 59 | Source: Ambulatory Visit | Attending: Radiation Oncology | Admitting: Radiation Oncology

## 2021-09-26 DIAGNOSIS — Z170 Estrogen receptor positive status [ER+]: Secondary | ICD-10-CM | POA: Diagnosis not present

## 2021-09-26 DIAGNOSIS — C50412 Malignant neoplasm of upper-outer quadrant of left female breast: Secondary | ICD-10-CM | POA: Diagnosis not present

## 2021-09-26 DIAGNOSIS — Z510 Encounter for antineoplastic radiation therapy: Secondary | ICD-10-CM | POA: Diagnosis not present

## 2021-09-27 ENCOUNTER — Ambulatory Visit
Admission: RE | Admit: 2021-09-27 | Discharge: 2021-09-27 | Disposition: A | Discharge status: Home / Self Care | Payer: 59 | Source: Ambulatory Visit | Attending: Radiation Oncology | Admitting: Radiation Oncology

## 2021-09-27 DIAGNOSIS — C50412 Malignant neoplasm of upper-outer quadrant of left female breast: Secondary | ICD-10-CM | POA: Diagnosis not present

## 2021-09-27 DIAGNOSIS — Z510 Encounter for antineoplastic radiation therapy: Secondary | ICD-10-CM | POA: Diagnosis not present

## 2021-09-27 DIAGNOSIS — Z170 Estrogen receptor positive status [ER+]: Secondary | ICD-10-CM | POA: Diagnosis not present

## 2021-09-28 ENCOUNTER — Ambulatory Visit: Payer: 59 | Admitting: Radiation Oncology

## 2021-09-28 ENCOUNTER — Other Ambulatory Visit: Payer: Self-pay

## 2021-09-28 ENCOUNTER — Ambulatory Visit
Admission: RE | Admit: 2021-09-28 | Discharge: 2021-09-28 | Disposition: A | Discharge status: Home / Self Care | Payer: 59 | Source: Ambulatory Visit | Attending: Radiation Oncology | Admitting: Radiation Oncology

## 2021-09-28 DIAGNOSIS — C50412 Malignant neoplasm of upper-outer quadrant of left female breast: Secondary | ICD-10-CM

## 2021-09-28 DIAGNOSIS — Z510 Encounter for antineoplastic radiation therapy: Secondary | ICD-10-CM | POA: Diagnosis not present

## 2021-09-28 DIAGNOSIS — Z170 Estrogen receptor positive status [ER+]: Secondary | ICD-10-CM | POA: Diagnosis not present

## 2021-09-28 MED ORDER — RADIAPLEXRX EX GEL
1.0000 "application " | Freq: Once | CUTANEOUS | Status: AC
  Administered 2021-09-28: 1 via TOPICAL

## 2021-10-01 ENCOUNTER — Encounter: Payer: Self-pay | Admitting: Hematology and Oncology

## 2021-10-01 ENCOUNTER — Inpatient Hospital Stay: Payer: 59 | Attending: Hematology and Oncology | Admitting: Hematology and Oncology

## 2021-10-01 ENCOUNTER — Other Ambulatory Visit: Payer: Self-pay

## 2021-10-01 ENCOUNTER — Ambulatory Visit: Payer: 59

## 2021-10-01 VITALS — BP 165/102 | HR 77 | Temp 97.9°F | Resp 18 | Ht 63.0 in | Wt 141.9 lb

## 2021-10-01 DIAGNOSIS — Z170 Estrogen receptor positive status [ER+]: Secondary | ICD-10-CM | POA: Diagnosis not present

## 2021-10-01 DIAGNOSIS — C50412 Malignant neoplasm of upper-outer quadrant of left female breast: Secondary | ICD-10-CM | POA: Diagnosis not present

## 2021-10-01 MED ORDER — ANASTROZOLE 1 MG PO TABS: 1.0000 mg | ORAL_TABLET | Freq: Every day | ORAL | 3 refills | Status: DC

## 2021-10-01 NOTE — Progress Notes (Signed)
[facility] ?CONSULT NOTE ? ?Patient Care Team: ?Erika Frizzle, MD as PCP - General (Family Medicine) ?Erika Keens, MD as Consulting Physician (General Surgery) ?Mauro Kaufmann, RN as Oncology Nurse Navigator ?Rockwell Germany, RN as Oncology Nurse Navigator ? ?CHIEF COMPLAINTS/PURPOSE OF CONSULTATION:  ?[place] left breast ? ?ASSESSMENT & PLAN:  ?Malignant neoplasm of upper-outer quadrant of left breast in female, estrogen receptor positive ([place]) ?This is a very pleasant 61 year old postmenopausal female patient with newly diagnosed invasive lobular carcinoma of the left breast, T1 N0 M0 grade, ER/PR positive, ER strong staining intensity 100%, PR 40% positive, strong staining intensity, HER2 negative and KI of 10% status post left lumpectomy.  Final pathology showed invasive lobular carcinoma, grade 3 measuring 0.7 cm along with low-grade DCIS, resection margins negative, all sentinel lymph nodes negative for carcinoma. ?Oncotype testing was sent which showed recurrence score of 28, distant recurrence risk at 9 years with tamoxifen alone is 17% and chemotherapy benefit was greater than 15%.   ? ?She declined adjuvant chemotherapy given small benefit and some family members having really bad experience with chemotherapy ?She is now undergoing adjuvant radiation, tolerating it well ?We have once again discussed about aromatase inhibitors, mechanism of action, adverse effects. ?She is agreeable to taking it. Prescription dispensed to pharmacy of her choice. ?Bone density screened ordered. ? ?RTC in 3 months. She will complete radiation and start antiestrogen therapy about 2 weeks after she completes radiation. ? ? ? ?HISTORY OF PRESENTING ILLNESS:  ?Erika Harvey 61 y.o. female is here because of invasive lobular carcinoma of the left breast. ? ?This is a 61 year old female patient seen by Dr. Ninfa Linden for evaluation of breast cancer.  She had screening mammography which showed a 6 mm mass in  the upper outer quadrant of left breast.  Biopsy showed an invasive lobular carcinoma with LCIS, grade 2.  Pathology showed ER 100%, positive, strong staining intensity, PR, 40% positive strong staining intensity, Ki of 10% and negative for HER2 1+ by IHC. ? ?She had a left breast lumpectomy which showed invasive lobular carcinoma, 0.7 cm, grade 3, DCIS, low-grade, negative resection margins, all sentinel lymph nodes negative for carcinoma.  Prognostics are not repeated. ?Final pathologic classification P T1b P N0 ? ?Oncotype score results are as 28, distant recurrence risk at 9 years with tamoxifen alone is 17% and chemotherapy benefit was deemed greater than 15%.During her last visit, we have discussed about added benefit of chemotherapy given her Oncotype score.  Given her age, small benefit and because she has talked to some friends who had bad experience with chemotherapy, understanding fully the benefit of added chemotherapy, she does not want to proceed with it.   ? ?She is now on adjuvant radiation, she is tolerating it quite well.  No adverse effects except for some skin hyperpigmentation and irritation.  She is agreeable to taking antiestrogen therapy. ? ? ?Rest of the pertinent 10 point ROS reviewed and negative. ? ?MEDICAL HISTORY:  ?Past Medical History:  ?Diagnosis Date  ? Allergy   ? Anxiety   ? Arthritis   ? Breast cancer ([place]) 07/19/2021  ? Chronic low back pain   ? Fibroid 03/30/2015  ? Frequent UTI   ? Hypertension   ? Vaginal bleeding 03/30/2015  ? ? ?SURGICAL HISTORY: ?Past Surgical History:  ?Procedure Laterality Date  ? BACK SURGERY  05-16-2015  ? BREAST LUMPECTOMY WITH RADIOACTIVE SEED AND SENTINEL LYMPH NODE BIOPSY Left 08/08/2021  ? Procedure: LEFT BREAST  LUMPECTOMY WITH RADIOACTIVE SEED AND SENTINEL LYMPH NODE BIOPSY;  Surgeon: Erika Keens, MD;  Location: [facility];  Service: General;  Laterality: Left;  ? KNEE SURGERY    ? left  ? KNEE SURGERY Left 07/08/2012  ? WISDOM  TOOTH EXTRACTION    ? ? ?SOCIAL HISTORY: ?Social History  ? ?Socioeconomic History  ? Marital status: Married  ?  Spouse name: Not on file  ? Number of children: Not on file  ? Years of education: Not on file  ? Highest education level: Not on file  ?Occupational History  ? Not on file  ?Tobacco Use  ? Smoking status: Never  ? Smokeless tobacco: Never  ?Vaping Use  ? Vaping Use: Never used  ?Substance and Sexual Activity  ? Alcohol use: Yes  ?  Alcohol/week: 5.0 standard drinks  ?  Types: 5 Cans of beer per week  ? Drug use: No  ? Sexual activity: Yes  ?  Birth control/protection: Pill  ?Other Topics Concern  ? Not on file  ?Social History Narrative  ? Not on file  ? ?Social Determinants of Health  ? ?Financial Resource Strain: Not on file  ?Food Insecurity: Not on file  ?Transportation Needs: Not on file  ?Physical Activity: Not on file  ?Stress: Not on file  ?Social Connections: Not on file  ?Intimate Partner Violence: Not on file  ? ? ?FAMILY HISTORY: ?Family History  ?Problem Relation Age of Onset  ? Breast cancer Mother   ? Heart disease Father 40  ? Diabetes Father   ? Colon cancer Neg Hx   ? Colon polyps Neg Hx   ? ? ?ALLERGIES:  is allergic to norvasc [amlodipine besylate]. ? ?MEDICATIONS:  ?Current Outpatient Medications  ?Medication Sig Dispense Refill  ? anastrozole (ARIMIDEX) 1 MG tablet Take 1 tablet (1 mg total) by mouth daily. 30 tablet 3  ? atenolol (TENORMIN) 25 MG tablet Take 1 tablet (25 mg total) by mouth daily. 90 tablet 2  ? atorvastatin (LIPITOR) 40 MG tablet Take 1 tablet (40 mg total) by mouth daily. 90 tablet 1  ? calcium-vitamin D (OSCAL WITH D) 250-125 MG-UNIT per tablet Take 1 tablet by mouth daily.    ? cyclobenzaprine (FLEXERIL) 10 MG tablet TAKE ONE TABLET BY MOUTH THREE TIMES A DAY AS NEEDED FOR MUSCLE SPASMS 60 tablet 2  ? diazepam (VALIUM) 5 MG tablet TAKE ONE TABLET BY MOUTH TWICE A DAY AS NEEDED FOR ANXIETY 60 tablet 0  ? diclofenac Sodium (VOLTAREN) 1 % GEL Apply 2 g topically 4  (four) times daily. 100 g 5  ? DULoxetine (CYMBALTA) 60 MG capsule Take 1 capsule (60 mg total) by mouth daily. 90 capsule 3  ? estradiol (ESTRACE) 0.1 MG/GM vaginal cream estradiol 0.01% (0.1 mg/gram) vaginal cream ? INSERT 1 ML VAGINALLY TWICE A WEEK    ? fish oil-omega-3 fatty acids 1000 MG capsule Take 2 g by mouth 2 (two) times daily.    ? losartan (COZAAR) 100 MG tablet Take 1 tablet (100 mg total) by mouth daily. 90 tablet 2  ? omeprazole (PRILOSEC) 20 MG capsule Take 1 capsule (20 mg total) by mouth daily. 90 capsule 3  ? traMADol (ULTRAM) 50 MG tablet Take 2 tablets (100 mg total) by mouth 2 (two) times daily. 120 tablet 0  ? zolpidem (AMBIEN) 10 MG tablet Take 1 tablet (10 mg total) by mouth at bedtime as needed for sleep. 30 tablet 1  ? ?No current facility-administered medications for  this visit.  ? ? ?PHYSICAL EXAMINATION: ?ECOG PERFORMANCE STATUS: 0 - Asymptomatic ? ?Vitals:  ? 10/01/21 1140  ?BP: (!) 165/102  ?Pulse: 77  ?Resp: 18  ?Temp: 97.9 ?F (36.6 ?C)  ?SpO2: 98%  ? ?Physical Exam ?Constitutional:   ?   Appearance: Normal appearance.  ?Chest:  ?   Comments: Left breast radiation appears to be going well, some skin hyperpigmentation consistent with postradiation changes.  No ulceration or infection ?Neurological:  ?   Mental Status: She is alert.  ? ? ? ?LABORATORY DATA:  ?I have reviewed the data as listed ?Lab Results  ?Component Value Date  ? WBC 6.0 04/06/2021  ? HGB 11.8 04/06/2021  ? HCT 35.1 04/06/2021  ? MCV 98.0 04/06/2021  ? PLT 290 04/06/2021  ? ?  Chemistry   ?   ?Component Value Date/Time  ? NA 139 04/06/2021 1449  ? K 4.7 04/06/2021 1449  ? CL 103 04/06/2021 1449  ? CO2 26 04/06/2021 1449  ? BUN 21 04/06/2021 1449  ? CREATININE 0.89 04/06/2021 1449  ?    ?Component Value Date/Time  ? CALCIUM 9.8 04/06/2021 1449  ? ALKPHOS 56 10/24/2015 0850  ? AST 55 (H) 04/06/2021 1449  ? ALT 78 (H) 04/06/2021 1449  ? BILITOT 0.3 04/06/2021 1449  ?  ? ? ? ?RADIOGRAPHIC STUDIES: ?I have personally  reviewed the radiological images as listed and agreed with the findings in the report. ?No results found. ? ?I spent 20 minutes in the care of this patient including history, physical exam, review of re

## 2021-10-01 NOTE — Assessment & Plan Note (Addendum)
This is a very pleasant 61 year old postmenopausal female patient with newly diagnosed invasive lobular carcinoma of the left breast, T1 N0 M0 grade, ER/PR positive, ER strong staining intensity 100%, PR 40% positive, strong staining intensity, HER2 negative and KI of 10% status post left lumpectomy.  Final pathology showed invasive lobular carcinoma, grade 3 measuring 0.7 cm along with low-grade DCIS, resection margins negative, all sentinel lymph nodes negative for carcinoma. ?Oncotype testing was sent which showed recurrence score of 28, distant recurrence risk at 9 years with tamoxifen alone is 17% and chemotherapy benefit was greater than 15%.   ? ?She declined adjuvant chemotherapy given small benefit and some family members having really bad experience with chemotherapy ?She is now undergoing adjuvant radiation, tolerating it well ?We have once again discussed about aromatase inhibitors, mechanism of action, adverse effects. ?She is agreeable to taking it. Prescription dispensed to pharmacy of her choice. ?Bone density screened ordered. ? ?RTC in 3 months. She will complete radiation and start antiestrogen therapy about 2 weeks after she completes radiation. ? ? ?

## 2021-10-02 ENCOUNTER — Ambulatory Visit: Payer: 59 | Admitting: Hematology and Oncology

## 2021-10-02 ENCOUNTER — Ambulatory Visit
Admission: RE | Admit: 2021-10-02 | Discharge: 2021-10-02 | Disposition: A | Discharge status: Home / Self Care | Payer: 59 | Source: Ambulatory Visit | Attending: Radiation Oncology | Admitting: Radiation Oncology

## 2021-10-02 ENCOUNTER — Other Ambulatory Visit: Payer: 59

## 2021-10-02 ENCOUNTER — Ambulatory Visit: Payer: 59

## 2021-10-02 ENCOUNTER — Telehealth: Payer: Self-pay | Admitting: Hematology and Oncology

## 2021-10-02 DIAGNOSIS — Z510 Encounter for antineoplastic radiation therapy: Secondary | ICD-10-CM | POA: Diagnosis not present

## 2021-10-02 DIAGNOSIS — Z170 Estrogen receptor positive status [ER+]: Secondary | ICD-10-CM | POA: Diagnosis not present

## 2021-10-02 DIAGNOSIS — C50412 Malignant neoplasm of upper-outer quadrant of left female breast: Secondary | ICD-10-CM | POA: Diagnosis not present

## 2021-10-02 NOTE — Telephone Encounter (Signed)
Scheduled appointment per 03/27 los. Patient aware.  ?

## 2021-10-03 ENCOUNTER — Ambulatory Visit
Admission: RE | Admit: 2021-10-03 | Discharge: 2021-10-03 | Disposition: A | Discharge status: Home / Self Care | Payer: 59 | Source: Ambulatory Visit | Attending: Radiation Oncology | Admitting: Radiation Oncology

## 2021-10-03 ENCOUNTER — Other Ambulatory Visit: Payer: Self-pay

## 2021-10-03 DIAGNOSIS — Z510 Encounter for antineoplastic radiation therapy: Secondary | ICD-10-CM | POA: Diagnosis not present

## 2021-10-03 DIAGNOSIS — Z170 Estrogen receptor positive status [ER+]: Secondary | ICD-10-CM | POA: Diagnosis not present

## 2021-10-03 DIAGNOSIS — C50412 Malignant neoplasm of upper-outer quadrant of left female breast: Secondary | ICD-10-CM | POA: Diagnosis not present

## 2021-10-04 ENCOUNTER — Ambulatory Visit
Admission: RE | Admit: 2021-10-04 | Discharge: 2021-10-04 | Disposition: A | Discharge status: Home / Self Care | Payer: 59 | Source: Ambulatory Visit | Attending: Radiation Oncology | Admitting: Radiation Oncology

## 2021-10-04 ENCOUNTER — Encounter: Payer: Self-pay | Admitting: Radiation Oncology

## 2021-10-04 DIAGNOSIS — C50412 Malignant neoplasm of upper-outer quadrant of left female breast: Secondary | ICD-10-CM | POA: Diagnosis not present

## 2021-10-04 DIAGNOSIS — Z170 Estrogen receptor positive status [ER+]: Secondary | ICD-10-CM | POA: Diagnosis not present

## 2021-10-04 DIAGNOSIS — Z510 Encounter for antineoplastic radiation therapy: Secondary | ICD-10-CM | POA: Diagnosis not present

## 2021-10-05 ENCOUNTER — Ambulatory Visit
Admission: RE | Admit: 2021-10-05 | Discharge: 2021-10-05 | Disposition: A | Discharge status: Home / Self Care | Payer: 59 | Source: Ambulatory Visit | Attending: Radiation Oncology | Admitting: Radiation Oncology

## 2021-10-05 ENCOUNTER — Ambulatory Visit: Payer: 59

## 2021-10-05 ENCOUNTER — Other Ambulatory Visit: Payer: Self-pay

## 2021-10-05 DIAGNOSIS — Z170 Estrogen receptor positive status [ER+]: Secondary | ICD-10-CM | POA: Diagnosis not present

## 2021-10-05 DIAGNOSIS — C50412 Malignant neoplasm of upper-outer quadrant of left female breast: Secondary | ICD-10-CM | POA: Diagnosis not present

## 2021-10-05 DIAGNOSIS — Z510 Encounter for antineoplastic radiation therapy: Secondary | ICD-10-CM | POA: Diagnosis not present

## 2021-10-07 DIAGNOSIS — C50412 Malignant neoplasm of upper-outer quadrant of left female breast: Secondary | ICD-10-CM | POA: Insufficient documentation

## 2021-10-07 DIAGNOSIS — Z170 Estrogen receptor positive status [ER+]: Secondary | ICD-10-CM | POA: Diagnosis not present

## 2021-10-07 DIAGNOSIS — Z510 Encounter for antineoplastic radiation therapy: Secondary | ICD-10-CM | POA: Diagnosis not present

## 2021-10-08 ENCOUNTER — Ambulatory Visit: Payer: 59 | Admitting: Hematology and Oncology

## 2021-10-08 ENCOUNTER — Ambulatory Visit
Admission: RE | Admit: 2021-10-08 | Discharge: 2021-10-08 | Disposition: A | Discharge status: Home / Self Care | Payer: 59 | Source: Ambulatory Visit | Attending: Radiation Oncology | Admitting: Radiation Oncology

## 2021-10-08 ENCOUNTER — Ambulatory Visit: Payer: 59

## 2021-10-08 ENCOUNTER — Other Ambulatory Visit: Payer: Self-pay

## 2021-10-08 DIAGNOSIS — Z170 Estrogen receptor positive status [ER+]: Secondary | ICD-10-CM | POA: Diagnosis not present

## 2021-10-08 DIAGNOSIS — Z510 Encounter for antineoplastic radiation therapy: Secondary | ICD-10-CM | POA: Diagnosis not present

## 2021-10-08 DIAGNOSIS — C50412 Malignant neoplasm of upper-outer quadrant of left female breast: Secondary | ICD-10-CM | POA: Diagnosis not present

## 2021-10-09 ENCOUNTER — Ambulatory Visit
Admission: RE | Admit: 2021-10-09 | Discharge: 2021-10-09 | Disposition: A | Discharge status: Home / Self Care | Payer: 59 | Source: Ambulatory Visit | Attending: Radiation Oncology | Admitting: Radiation Oncology

## 2021-10-09 ENCOUNTER — Encounter: Payer: Self-pay | Admitting: *Deleted

## 2021-10-09 DIAGNOSIS — C50412 Malignant neoplasm of upper-outer quadrant of left female breast: Secondary | ICD-10-CM | POA: Diagnosis not present

## 2021-10-09 DIAGNOSIS — Z510 Encounter for antineoplastic radiation therapy: Secondary | ICD-10-CM | POA: Diagnosis not present

## 2021-10-09 DIAGNOSIS — Z170 Estrogen receptor positive status [ER+]: Secondary | ICD-10-CM | POA: Diagnosis not present

## 2021-10-10 ENCOUNTER — Other Ambulatory Visit: Payer: Self-pay | Admitting: Family Medicine

## 2021-10-10 ENCOUNTER — Other Ambulatory Visit: Payer: Self-pay

## 2021-10-10 ENCOUNTER — Telehealth: Payer: Self-pay | Admitting: Family Medicine

## 2021-10-10 ENCOUNTER — Ambulatory Visit: Payer: 59

## 2021-10-10 ENCOUNTER — Ambulatory Visit
Admission: RE | Admit: 2021-10-10 | Discharge: 2021-10-10 | Disposition: A | Discharge status: Home / Self Care | Payer: 59 | Source: Ambulatory Visit | Attending: Radiation Oncology | Admitting: Radiation Oncology

## 2021-10-10 DIAGNOSIS — F419 Anxiety disorder, unspecified: Secondary | ICD-10-CM

## 2021-10-10 DIAGNOSIS — C50412 Malignant neoplasm of upper-outer quadrant of left female breast: Secondary | ICD-10-CM | POA: Diagnosis not present

## 2021-10-10 DIAGNOSIS — Z510 Encounter for antineoplastic radiation therapy: Secondary | ICD-10-CM | POA: Diagnosis not present

## 2021-10-10 DIAGNOSIS — Z170 Estrogen receptor positive status [ER+]: Secondary | ICD-10-CM | POA: Diagnosis not present

## 2021-10-10 NOTE — Telephone Encounter (Signed)
Received eFax from pharmacy to request refill of ? ?traMADol (ULTRAM) 50 MG tablet [[id number]]  DISCONTINUED ? ?Efax came from ? ?[facility] [id number] [place] [address] [place]  ?[place], [address] [place] [address]  ?Phone:  [phone]  Fax:  [phone]  ? ?Please advise pharmacist. ?

## 2021-10-11 ENCOUNTER — Telehealth: Payer: Self-pay | Admitting: Family Medicine

## 2021-10-11 ENCOUNTER — Other Ambulatory Visit: Payer: Self-pay | Admitting: Family Medicine

## 2021-10-11 ENCOUNTER — Ambulatory Visit
Admission: RE | Admit: 2021-10-11 | Discharge: 2021-10-11 | Disposition: A | Discharge status: Home / Self Care | Payer: 59 | Source: Ambulatory Visit | Attending: Radiation Oncology | Admitting: Radiation Oncology

## 2021-10-11 ENCOUNTER — Encounter: Payer: Self-pay | Admitting: Radiation Oncology

## 2021-10-11 DIAGNOSIS — Z170 Estrogen receptor positive status [ER+]: Secondary | ICD-10-CM | POA: Diagnosis not present

## 2021-10-11 DIAGNOSIS — I10 Essential (primary) hypertension: Secondary | ICD-10-CM

## 2021-10-11 DIAGNOSIS — C50412 Malignant neoplasm of upper-outer quadrant of left female breast: Secondary | ICD-10-CM | POA: Diagnosis not present

## 2021-10-11 DIAGNOSIS — Z510 Encounter for antineoplastic radiation therapy: Secondary | ICD-10-CM | POA: Diagnosis not present

## 2021-10-11 MED ORDER — OMEPRAZOLE 20 MG PO CPDR: 20.0000 mg | DELAYED_RELEASE_CAPSULE | Freq: Every day | ORAL | 3 refills | Status: DC

## 2021-10-11 NOTE — Telephone Encounter (Signed)
Received eFax from pharmacy to request refill of ? ?omeprazole (PRILOSEC) 20 MG capsule ? ? ?Efax received from ? ?[facility] [id number] [place]  ?[place], [address] [place] [address]  ?Phone:  [phone]  Fax:  [phone]  ? ?Please advise pharmacist.  ?

## 2021-10-11 NOTE — Telephone Encounter (Signed)
LOV 9/30/2 ?Last refill 09/05/21, #120, 0 refills ? ?Please review, thanks! ? ? ?

## 2021-10-11 NOTE — Telephone Encounter (Signed)
LOV 04/06/21 ?Last refill 09/12/21, #60, 0 refills ? ?Please review, thanks! ? ?

## 2021-10-11 NOTE — Telephone Encounter (Signed)
Rx sent to pharmacy   

## 2021-10-12 NOTE — Progress Notes (Signed)
?  Radiation Oncology         [phone] ?________________________________ ? ?Name: [name] MRN: [id number]  ?Date: 10/04/2021  DOB: 23-Aug-1960 ? ?SIMULATION NOTE ? ? ?NARRATIVE:  The patient underwent simulation today for ongoing radiation therapy.  The existing CT study set was employed for the purpose of virtual treatment planning.  The target and avoidance structures were reviewed and modified as necessary.  Treatment planning then occurred.  The radiation boost prescription was entered and confirmed.  A total of 3 complex treatment devices were fabricated in the form of multi-leaf collimators to shape radiation around the targets while maximally excluding nearby normal structures. I have requested : Isodose Plan.  ? ? ?PLAN:  This modified radiation beam arrangement is intended to continue the current radiation dose to an additional 8 Gy in 4 fractions for a total cumulative dose of 50.56 Gy. ? ? ? ?------------------------------------------------ ? ?Jodelle Gross, MD, PhD ? ?  ? ?

## 2021-10-15 ENCOUNTER — Other Ambulatory Visit: Payer: Self-pay | Admitting: Family Medicine

## 2021-10-15 MED ORDER — TRAMADOL HCL 50 MG PO TABS: 50.0000 mg | ORAL_TABLET | Freq: Four times a day (QID) | ORAL | 0 refills | Status: DC | PRN

## 2021-10-16 ENCOUNTER — Other Ambulatory Visit: Payer: Self-pay | Admitting: Family Medicine

## 2021-10-16 NOTE — Telephone Encounter (Signed)
LOV 04/06/21 Last refill 09/17/21, #30, 1 refills  Please review, thanks!

## 2021-10-22 ENCOUNTER — Ambulatory Visit: Payer: 59

## 2021-10-22 ENCOUNTER — Other Ambulatory Visit: Payer: 59

## 2021-10-22 ENCOUNTER — Telehealth: Payer: Self-pay

## 2021-10-22 ENCOUNTER — Ambulatory Visit: Payer: 59 | Admitting: Hematology and Oncology

## 2021-10-22 NOTE — Telephone Encounter (Signed)
Pharmacy faxed a refill request for  ? ?traMADol (ULTRAM) 50 MG tablet [[id number]]  ?  Order Details ?Dose: 50 mg Route: Oral Frequency: Every 6 hours PRN  ?Dispense Quantity: 120 tablet Refills: 0   ?     ?Sig: Take 1 tablet (50 mg total) by mouth every 6 (six) hours as needed.  ?     ?Start Date: 10/15/21 End Date: 11/14/21  ?Written Date: 10/15/21 Expiration Date: 04/13/22  ? ?

## 2021-10-22 NOTE — Telephone Encounter (Signed)
Pharmacy faxed a refill request for  ? ?omeprazole (PRILOSEC) 20 MG capsule [[id number]]  ?  Order Details ?Dose: 20 mg Route: Oral Frequency: Daily  ?Dispense Quantity: 90 capsule Refills: 3   ?     ?Sig: Take 1 capsule (20 mg total) by mouth daily.  ?     ?Start Date: 10/11/21 End Date: --  ?Written Date: 10/11/21 Expiration Date: 10/11/22  ? ?

## 2021-10-23 NOTE — Telephone Encounter (Signed)
Tramadol ?E-Prescribing Status: Receipt confirmed by pharmacy (10/15/2021  6:44 AM EDT) ? ?Omeprazole ?E-Prescribing Status: Receipt confirmed by pharmacy (10/11/2021  5:30 PM EDT) ? ?Spoke with pharmacy and they did not see these in their queue. She will remove refill requests. Nothing further needed at this time.  ? ?

## 2021-10-29 ENCOUNTER — Telehealth: Payer: Self-pay

## 2021-10-29 NOTE — Telephone Encounter (Signed)
Pharmacy faxed a refill request for ? ?cyclobenzaprine (FLEXERIL) 10 MG tablet [[id number]]  ?  Order Details ?Dose, Route, Frequency: As Directed  ?Dispense Quantity: 60 tablet Refills: 2   ?     ?Sig: TAKE ONE TABLET BY MOUTH THREE TIMES A DAY AS NEEDED FOR MUSCLE SPASMS  ?     ?Start Date: 08/08/21 End Date: --  ?Written Date: 08/08/21 Expiration Date: 08/08/22  ? ?

## 2021-10-30 MED ORDER — CYCLOBENZAPRINE HCL 10 MG PO TABS: ORAL_TABLET | ORAL | 2 refills | Status: DC

## 2021-10-30 NOTE — Telephone Encounter (Signed)
Rx sent to pharmacy   

## 2021-11-01 NOTE — Telephone Encounter (Signed)
PA approve for Zolpidem Tartrat '10mg'$  or tabs  ? ?Effective 09/21/21 to 09/21/2024 ?

## 2021-11-05 ENCOUNTER — Telehealth: Payer: Self-pay

## 2021-11-05 ENCOUNTER — Ambulatory Visit: Admission: EM | Admit: 2021-11-05 | Discharge: 2021-11-05 | Discharge status: Absent without leave | Payer: 59

## 2021-11-05 ENCOUNTER — Other Ambulatory Visit: Payer: Self-pay | Admitting: Family Medicine

## 2021-11-05 DIAGNOSIS — J019 Acute sinusitis, unspecified: Secondary | ICD-10-CM | POA: Diagnosis not present

## 2021-11-05 DIAGNOSIS — J029 Acute pharyngitis, unspecified: Secondary | ICD-10-CM | POA: Diagnosis not present

## 2021-11-05 MED ORDER — FLUTICASONE PROPIONATE 50 MCG/ACT NA SUSP: 2.0000 | Freq: Every day | NASAL | 6 refills | Status: DC

## 2021-11-05 MED ORDER — LEVOCETIRIZINE DIHYDROCHLORIDE 5 MG PO TABS: 5.0000 mg | ORAL_TABLET | Freq: Every evening | ORAL | 0 refills | Status: DC

## 2021-11-05 NOTE — Telephone Encounter (Signed)
Left msg for pt to return call and sent MyChart msg to pt re Rx's sent in per Dr. Dennard Schaumann.  ?

## 2021-11-05 NOTE — Telephone Encounter (Signed)
Pt called in stating that she has a sinus infection and would like to know if pcp would send in a prescription to help with the symptoms. Please advise. ? ?Cb #: [phone] ?

## 2021-11-06 NOTE — Progress Notes (Signed)
? ?                                                                                                                                                          ?  Patient Name: Erika Harvey ?MRN: [id number] ?DOB: [birth year] ?Referring Physician: [name] (Profile Not Attached) ?Date of Service: 10/11/2021 ?[facility] [place] ? ?                                                      End Of Treatment Note ? ?Diagnoses: C50.412-Malignant neoplasm of upper-outer quadrant of left female breast ? 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 ? ?Intent: Curative ? ?Radiation Treatment Dates: 09/13/2021 through 10/11/2021 ?Site Technique Total Dose (Gy) Dose per Fx (Gy) Completed Fx Beam Energies  ?Breast, Left: Breast_L 3D 42.56/42.56 2.66 16/16 6XFFF  ?Breast, Left: Breast_L_Bst 3D 8/8 2 4/4 6X  ? ?Narrative: The patient tolerated radiation therapy relatively well. She developed fatigue and anticipated skin changes in the treatment field.  ? ?Plan: The patient will receive a call in about one month from the radiation oncology department. She will continue follow up with Dr. Chryl Heck as well.  ? ?________________________________________________ ? ? ? ?Carola Rhine, PAC  ?

## 2021-11-08 ENCOUNTER — Other Ambulatory Visit: Payer: Self-pay | Admitting: Family Medicine

## 2021-11-08 DIAGNOSIS — F419 Anxiety disorder, unspecified: Secondary | ICD-10-CM

## 2021-11-08 NOTE — Telephone Encounter (Signed)
Pharmacy faxed a refill request for ? ?traMADol (ULTRAM) 50 MG tablet [[id number]]  ?  Order Details ?Dose: 50 mg Route: Oral Frequency: Every 6 hours PRN  ?Dispense Quantity: 120 tablet Refills: 0   ?     ?Sig: Take 1 tablet (50 mg total) by mouth every 6 (six) hours as needed.  ?     ?Start Date: 10/15/21 End Date: 11/14/21  ? ?

## 2021-11-08 NOTE — Telephone Encounter (Signed)
Requested medication (s) are due for refill today: yes ? ?Requested medication (s) are on the active medication list: yes ? ?Last refill:  tramadol 10/15/21 #120/0, diazepam 10/11/21 #60/0 ? ?Future visit scheduled: no ? ?Notes to clinic:  Unable to refill per protocol, cannot delegate. ? ?  ?Requested Prescriptions  ?Pending Prescriptions Disp Refills  ? diazepam (VALIUM) 5 MG tablet [Pharmacy Med Name: diazePAM 5 MG TABLET] 60 tablet   ?  Sig: TAKE ONE TABLET BY MOUTH TWICE A DAY AS NEEDED FOR ANXIETY  ?  ? Not Delegated - Psychiatry: Anxiolytics/Hypnotics 2 Failed - 11/08/2021  3:41 PM  ?  ?  Failed - This refill cannot be delegated  ?  ?  Failed - Urine Drug Screen completed in last 360 days  ?  ?  Failed - Valid encounter within last 6 months  ?  Recent Outpatient Visits   ? ?      ? 7 months ago Chronic pain of left knee  ? [facility], Cammie Mcgee, MD  ? 1 year ago Bilateral chronic knee pain  ? [facility], Cammie Mcgee, MD  ? 1 year ago Bilateral chronic knee pain  ? [facility], Cammie Mcgee, MD  ? 2 years ago Acute renal insufficiency  ? [facility], Cammie Mcgee, MD  ? 2 years ago Benign essential HTN  ? [facility], Cammie Mcgee, MD  ? ?  ?  ? ? ?  ?  ?  Passed - Patient is not pregnant  ?  ?  ? traMADol (ULTRAM) 50 MG tablet 120 tablet 0  ?  Sig: Take 1 tablet (50 mg total) by mouth every 6 (six) hours as needed.  ?  ? Not Delegated - Analgesics:  Opioid Agonists Failed - 11/08/2021  3:41 PM  ?  ?  Failed - This refill cannot be delegated  ?  ?  Failed - Urine Drug Screen completed in last 360 days  ?  ?  Failed - Valid encounter within last 3 months  ?  Recent Outpatient Visits   ? ?      ? 7 months ago Chronic pain of left knee  ? [facility] [place], Cammie Mcgee, MD  ? 1 year ago Bilateral chronic knee pain  ? [facility], Cammie Mcgee, MD  ? 1 year ago Bilateral  chronic knee pain  ? [facility], Cammie Mcgee, MD  ? 2 years ago Acute renal insufficiency  ? [facility], Cammie Mcgee, MD  ? 2 years ago Benign essential HTN  ? [facility], Cammie Mcgee, MD  ? ?  ?  ? ? ?  ?  ?  ? ?

## 2021-11-09 MED ORDER — TRAMADOL HCL 50 MG PO TABS: 50.0000 mg | ORAL_TABLET | Freq: Four times a day (QID) | ORAL | 0 refills | Status: AC | PRN

## 2021-11-09 NOTE — Telephone Encounter (Signed)
LOV 04/06/21 ? ?Last refill  ?Tramadol     10/15/21, #120, 0 refills ?Diazepam    10/11/01, #60, 0 refills ? ? ?Please review, thanks! ? ?

## 2021-11-18 ENCOUNTER — Encounter: Payer: Self-pay | Admitting: Emergency Medicine

## 2021-11-18 ENCOUNTER — Ambulatory Visit
Admission: EM | Admit: 2021-11-18 | Discharge: 2021-11-18 | Disposition: A | Discharge status: Home / Self Care | Payer: 59 | Attending: Internal Medicine | Admitting: Internal Medicine

## 2021-11-18 DIAGNOSIS — L089 Local infection of the skin and subcutaneous tissue, unspecified: Secondary | ICD-10-CM

## 2021-11-18 DIAGNOSIS — T148XXA Other injury of unspecified body region, initial encounter: Secondary | ICD-10-CM

## 2021-11-18 MED ORDER — CEPHALEXIN 500 MG PO CAPS: 500.0000 mg | ORAL_CAPSULE | Freq: Three times a day (TID) | ORAL | 0 refills | Status: AC

## 2021-11-18 NOTE — ED Triage Notes (Signed)
Dog's toenail cut patient's left lower leg a week ago.  Patient states she has been putting neosporin and peroxide on area.  Unsure of when last tetanus shot was ?

## 2021-11-18 NOTE — Discharge Instructions (Signed)
Please stop using hydrogen peroxide when doing the wound dressing changes ?Take antibiotics as prescribed and complete a course of antibiotics ?Ibuprofen as needed for pain ?Return to urgent care if you have worsening redness, drainage, pain or fever. ?

## 2021-11-19 NOTE — ED Provider Notes (Signed)
?[facility] ? ? ? ?CSN: [id number] ?Arrival date & time: 11/18/21  1312 ? ? ?  ? ?History   ?Chief Complaint ?No chief complaint on file. ? ? ?HPI ?Erika Harvey is a 61 y.o. female comes to the urgent care with complaints of right leg wound of 1 week duration.  Patient's dog scratched her leg one week ago.  Patient's dog is fully vaccinated.  Patient has been cleaning the wound with hydrogen peroxide, topical antibiotic ointment and daily dressing changes.  She has developed some redness around the wound over the past few days.  No drainage or discharge.  Wound is not overly painful.  She comes to the urgent care with concerns of poor wound healing. ? ?HPI ? ?Past Medical History:  ?Diagnosis Date  ? Allergy   ? Anxiety   ? Arthritis   ? Breast cancer ([place]) 07/19/2021  ? Chronic low back pain   ? Fibroid 03/30/2015  ? Frequent UTI   ? Hypertension   ? Vaginal bleeding 03/30/2015  ? ? ?Patient Active Problem List  ? Diagnosis Date Noted  ? Malignant neoplasm of upper-outer quadrant of left breast in female, estrogen receptor positive ([place]) 07/25/2021  ? Hypoglycemia 09/15/2018  ? Insomnia 09/07/2015  ? Osteopenia 08/09/2015  ? Vaginal bleeding 03/30/2015  ? Fibroid 03/30/2015  ? GERD (gastroesophageal reflux disease) 11/10/2014  ? Generalized OA 03/29/2014  ? Lumbar herniated disc 02/09/2014  ?  Class: Chronic  ? Chronic pain syndrome 02/09/2014  ? DDD (degenerative disc disease), lumbar 02/09/2014  ? Spondylolysis 02/09/2014  ? Lumbosacral radiculitis 02/09/2014  ? Chronic low back pain   ? Hypertension   ? Anxiety   ? Frequent UTI   ? ? ?Past Surgical History:  ?Procedure Laterality Date  ? BACK SURGERY  05-16-2015  ? BREAST LUMPECTOMY WITH RADIOACTIVE SEED AND SENTINEL LYMPH NODE BIOPSY Left 08/08/2021  ? Procedure: LEFT BREAST LUMPECTOMY WITH RADIOACTIVE SEED AND SENTINEL LYMPH NODE BIOPSY;  Surgeon: Coralie Keens, MD;  Location: [facility];  Service: General;  Laterality: Left;  ?  KNEE SURGERY    ? left  ? KNEE SURGERY Left 07/08/2012  ? WISDOM TOOTH EXTRACTION    ? ? ?OB History   ? ? Gravida  ?0  ? Para  ?0  ? Term  ?0  ? Preterm  ?0  ? AB  ?0  ? Living  ?0  ?  ? ? SAB  ?0  ? IAB  ?0  ? Ectopic  ?0  ? Multiple  ?0  ? Live Births  ?   ?   ?  ?  ? ? ? ?Home Medications   ? ?Prior to Admission medications   ?Medication Sig Start Date End Date Taking? Authorizing Provider  ?cephALEXin (KEFLEX) 500 MG capsule Take 1 capsule (500 mg total) by mouth 3 (three) times daily for 7 days. 11/18/21 11/25/21 Yes Natalyia Innes, Myrene Galas, MD  ?diazepam (VALIUM) 5 MG tablet TAKE ONE TABLET BY MOUTH TWICE A DAY AS NEEDED FOR ANXIETY 11/09/21   [name], MD  ?anastrozole (ARIMIDEX) 1 MG tablet Take 1 tablet (1 mg total) by mouth daily. 10/01/21   Benay Pike, MD  ?atenolol (TENORMIN) 25 MG tablet Take 1 tablet (25 mg total) by mouth daily. 07/03/21   [name], MD  ?atorvastatin (LIPITOR) 40 MG tablet Take 1 tablet (40 mg total) by mouth daily. 07/13/21   [name], MD  ?calcium-vitamin D (Darron Doom WITH D)  250-125 MG-UNIT per tablet Take 1 tablet by mouth daily.    [provider]  ?cyclobenzaprine (FLEXERIL) 10 MG tablet TAKE ONE TABLET BY MOUTH THREE TIMES A DAY AS NEEDED FOR MUSCLE SPASMS 10/30/21   [name], MD  ?diclofenac Sodium (VOLTAREN) 1 % GEL Apply 2 g topically 4 (four) times daily. 07/08/19   [name], MD  ?DULoxetine (CYMBALTA) 60 MG capsule Take 1 capsule (60 mg total) by mouth daily. 03/09/21   [name], MD  ?estradiol (ESTRACE) 0.1 MG/GM vaginal cream estradiol 0.01% (0.1 mg/gram) vaginal cream ? INSERT 1 ML VAGINALLY TWICE A WEEK    [provider]  ?fish oil-omega-3 fatty acids 1000 MG capsule Take 2 g by mouth 2 (two) times daily.    [provider]  ?fluticasone (FLONASE) 50 MCG/ACT nasal spray Place 2 sprays into both nostrils daily. 11/05/21   [name], MD  ?levocetirizine [name] ALLERGY 24HR) 5 MG tablet Take 1 tablet  (5 mg total) by mouth every evening. 11/05/21   [name], MD  ?losartan (COZAAR) 100 MG tablet TAKE ONE TABLET BY MOUTH DAILY 10/11/21   [name], MD  ?omeprazole (PRILOSEC) 20 MG capsule Take 1 capsule (20 mg total) by mouth daily. 10/11/21   [name], MD  ?traMADol (ULTRAM) 50 MG tablet Take 1 tablet (50 mg total) by mouth every 6 (six) hours as needed. 11/09/21 12/09/21  [name], MD  ?zolpidem (AMBIEN) 10 MG tablet Take 1 tablet (10 mg total) by mouth at bedtime as needed for sleep. 09/17/21 10/17/21  [name], MD  ? ? ?Family History ?Family History  ?Problem Relation Age of Onset  ? Breast cancer Mother   ? Heart disease Father 65  ? Diabetes Father   ? Colon cancer Neg Hx   ? Colon polyps Neg Hx   ? ? ?Social History ?Social History  ? ?Tobacco Use  ? Smoking status: Never  ? Smokeless tobacco: Never  ?Vaping Use  ? Vaping Use: Never used  ?Substance Use Topics  ? Alcohol use: Yes  ?  Alcohol/week: 5.0 standard drinks  ?  Types: 5 Cans of beer per week  ? Drug use: No  ? ? ? ?Allergies   ?Norvasc [amlodipine besylate] ? ? ?Review of Systems ?Review of Systems  ?Constitutional:  Negative for chills and fever.  ?Genitourinary: Negative.   ?Skin:  Positive for color change and wound. Negative for pallor and rash.  ?Neurological: Negative.   ? ? ?Physical Exam ?Triage Vital Signs ?ED Triage Vitals  ?Enc Vitals Group  ?   BP 11/18/21 1431 (!) 94/59  ?   Pulse Rate 11/18/21 1431 90  ?   Resp 11/18/21 1431 18  ?   Temp 11/18/21 1431 97.7 ?F (36.5 ?C)  ?   Temp Source 11/18/21 1431 Oral  ?   SpO2 11/18/21 1431 99 %  ?   Weight --   ?   Height --   ?   Head Circumference --   ?   Peak Flow --   ?   Pain Score 11/18/21 1434 8  ?   Pain Loc --   ?   Pain Edu? --   ?   Excl. in [place]? --   ? ?No data found. ? ?Updated Vital Signs ?BP (!) 94/59 (BP Location: Right Arm)   Pulse 90   Temp 97.7 ?F (36.5 ?C) (Oral)   Resp 18  LMP 03/23/2015   SpO2 99%  ? ?Visual Acuity ?Right Eye Distance:    ?Left Eye Distance:   ?Bilateral Distance:   ? ?Right Eye Near:   ?Left Eye Near:    ?Bilateral Near:    ? ?Physical Exam ?Vitals and nursing note reviewed.  ?Constitutional:   ?   General: She is not in acute distress. ?   Appearance: She is not ill-appearing.  ?Cardiovascular:  ?   Rate and Rhythm: Normal rate and regular rhythm.  ?Musculoskeletal:     ?   General: No swelling or tenderness. Normal range of motion.  ?Skin: ?   General: Skin is warm.  ?   Findings: Erythema present.  ?   Comments: Anterior lateral right leg pain.  The wound is on the distal aspect of the right leg and measures about 2 inches in the longest diameter.  The base of the wound has some granulation tissue which looks unhealthy.  Skin flap is adherent to about 20% of the wound surface.  Surrounding skin has some erythema.  No discharge noted.  ?Neurological:  ?   Mental Status: She is alert.  ? ? ? ?UC Treatments / Results  ?Labs ?(all labs ordered are listed, but only abnormal results are displayed) ?Labs Reviewed - No data to display ? ?EKG ? ? ?Radiology ?No results found. ? ?Procedures ?Procedures (including critical care time) ? ?Medications Ordered in UC ?Medications - No data to display ? ?Initial Impression / Assessment and Plan / UC Course  ?I have reviewed the triage vital signs and the nursing notes. ? ?Pertinent labs & imaging results that were available during my care of the patient were reviewed by me and considered in my medical decision making (see chart for details). ? ?  ? ?1.  Infected leg wound: ?Patient is advised to discontinue hydrogen peroxide use ?Daily wound dressing changes recommended ?Keflex 500 mg 3 times daily for 7 days ?Ibuprofen/Tylenol as needed for pain ?Return precautions given. ?Final Clinical Impressions(s) / UC Diagnoses  ? ?Final diagnoses:  ?Infected wound  ? ? ? ?Discharge Instructions   ? ?  ?Please stop using hydrogen peroxide when doing the wound dressing changes ?Take antibiotics as  prescribed and complete a course of antibiotics ?Ibuprofen as needed for pain ?Return to urgent care if you have worsening redness, drainage, pain or fever. ? ? ?ED Prescriptions   ? ? Medication Sig Dispense Au

## 2021-11-26 ENCOUNTER — Ambulatory Visit
Admission: RE | Admit: 2021-11-26 | Discharge: 2021-11-26 | Disposition: A | Discharge status: Home / Self Care | Payer: 59 | Source: Ambulatory Visit | Attending: Radiation Oncology | Admitting: Radiation Oncology

## 2021-11-26 ENCOUNTER — Ambulatory Visit
Admission: EM | Admit: 2021-11-26 | Discharge: 2021-11-26 | Disposition: A | Discharge status: Home / Self Care | Payer: 59 | Attending: Nurse Practitioner | Admitting: Nurse Practitioner

## 2021-11-26 ENCOUNTER — Encounter: Payer: Self-pay | Admitting: Emergency Medicine

## 2021-11-26 DIAGNOSIS — C50412 Malignant neoplasm of upper-outer quadrant of left female breast: Secondary | ICD-10-CM | POA: Insufficient documentation

## 2021-11-26 DIAGNOSIS — Z170 Estrogen receptor positive status [ER+]: Secondary | ICD-10-CM | POA: Insufficient documentation

## 2021-11-26 DIAGNOSIS — S81802A Unspecified open wound, left lower leg, initial encounter: Secondary | ICD-10-CM

## 2021-11-26 MED ORDER — CEFTRIAXONE SODIUM 1 G IJ SOLR
1.0000 g | Freq: Once | INTRAMUSCULAR | Status: AC
  Administered 2021-11-26: 1 g via INTRAMUSCULAR

## 2021-11-26 MED ORDER — DOXYCYCLINE HYCLATE 100 MG PO TABS: 100.0000 mg | ORAL_TABLET | Freq: Two times a day (BID) | ORAL | 0 refills | Status: AC

## 2021-11-26 NOTE — ED Provider Notes (Addendum)
[facility]    CSN: [id number] Arrival date & time: 11/26/21  0904      History   Chief Complaint  HPI Erika Harvey is a 61 y.o. female who returns for follow-up for a wound to the left lower extremity. Patient was seen on 5/14, injury occurred one week prior. She continues to have redness, swelling and and worsening pain to the LLE. Patient's dog scratched her leg one week ago.  She has edness around the wound, but states the redness has improved. She was prescribed Keflex, which she has completed. She denies fever, chills, drainage or discharge.  Wound is becoming painful, especially with ambulation. She returns for continued concerns of poor wound healing.   Past Medical History:  Diagnosis Date   Allergy    Anxiety    Arthritis    Breast cancer ([place]) 07/19/2021   Chronic low back pain    Fibroid 03/30/2015   Frequent UTI    Hypertension    Vaginal bleeding 03/30/2015    Patient Active Problem List   Diagnosis Date Noted   Malignant neoplasm of upper-outer quadrant of left breast in female, estrogen receptor positive ([place]) 07/25/2021   Hypoglycemia 09/15/2018   Insomnia 09/07/2015   Osteopenia 08/09/2015   Vaginal bleeding 03/30/2015   Fibroid 03/30/2015   GERD (gastroesophageal reflux disease) 11/10/2014   Generalized OA 03/29/2014   Lumbar herniated disc 02/09/2014    Class: Chronic   Chronic pain syndrome 02/09/2014   DDD (degenerative disc disease), lumbar 02/09/2014   Spondylolysis 02/09/2014   Lumbosacral radiculitis 02/09/2014   Chronic low back pain    Hypertension    Anxiety    Frequent UTI     Past Surgical History:  Procedure Laterality Date   BACK SURGERY  05-16-2015   BREAST LUMPECTOMY WITH RADIOACTIVE SEED AND SENTINEL LYMPH NODE BIOPSY Left 08/08/2021   Procedure: LEFT BREAST LUMPECTOMY WITH RADIOACTIVE SEED AND SENTINEL LYMPH NODE BIOPSY;  Surgeon: Coralie Keens, MD;  Location: [facility];  Service: General;   Laterality: Left;   KNEE SURGERY     left   KNEE SURGERY Left 07/08/2012   WISDOM TOOTH EXTRACTION      OB History     Gravida  0   Para  0   Term  0   Preterm  0   AB  0   Living  0      SAB  0   IAB  0   Ectopic  0   Multiple  0   Live Births               Home Medications    Prior to Admission medications   Medication Sig Start Date End Date Taking? Authorizing Provider  diazepam (VALIUM) 5 MG tablet TAKE ONE TABLET BY MOUTH TWICE A DAY AS NEEDED FOR ANXIETY 11/09/21   [name], MD  doxycycline (VIBRA-TABS) 100 MG tablet Take 1 tablet (100 mg total) by mouth 2 (two) times daily for 10 days. 11/26/21 12/06/21 Yes Kellina Dreese-Warren, Alda Lea, NP  anastrozole (ARIMIDEX) 1 MG tablet Take 1 tablet (1 mg total) by mouth daily. 10/01/21   Benay Pike, MD  atenolol (TENORMIN) 25 MG tablet Take 1 tablet (25 mg total) by mouth daily. 07/03/21   [name], MD  atorvastatin (LIPITOR) 40 MG tablet Take 1 tablet (40 mg total) by mouth daily. 07/13/21   [name], MD  calcium-vitamin D (OSCAL WITH D) 250-125 MG-UNIT per tablet  Take 1 tablet by mouth daily.    [provider]  cyclobenzaprine (FLEXERIL) 10 MG tablet TAKE ONE TABLET BY MOUTH THREE TIMES A DAY AS NEEDED FOR MUSCLE SPASMS 10/30/21   [name], MD  diclofenac Sodium (VOLTAREN) 1 % GEL Apply 2 g topically 4 (four) times daily. 07/08/19   [name], MD  DULoxetine (CYMBALTA) 60 MG capsule Take 1 capsule (60 mg total) by mouth daily. 03/09/21   [name], MD  estradiol (ESTRACE) 0.1 MG/GM vaginal cream estradiol 0.01% (0.1 mg/gram) vaginal cream  INSERT 1 ML VAGINALLY TWICE A WEEK    [provider]  fish oil-omega-3 fatty acids 1000 MG capsule Take 2 g by mouth 2 (two) times daily.    [provider]  fluticasone (FLONASE) 50 MCG/ACT nasal spray Place 2 sprays into both nostrils daily. 11/05/21   [name], MD  levocetirizine (XYZAL ALLERGY  24HR) 5 MG tablet Take 1 tablet (5 mg total) by mouth every evening. 11/05/21   [name], MD  losartan (COZAAR) 100 MG tablet TAKE ONE TABLET BY MOUTH DAILY 10/11/21   [name], MD  omeprazole (PRILOSEC) 20 MG capsule Take 1 capsule (20 mg total) by mouth daily. 10/11/21   [name], MD  traMADol (ULTRAM) 50 MG tablet Take 1 tablet (50 mg total) by mouth every 6 (six) hours as needed. 11/09/21 12/09/21  [name], MD  zolpidem (AMBIEN) 10 MG tablet Take 1 tablet (10 mg total) by mouth at bedtime as needed for sleep. 09/17/21 10/17/21  [name], MD    Family History Family History  Problem Relation Age of Onset   Breast cancer Mother    Heart disease Father 69   Diabetes Father    Colon cancer Neg Hx    Colon polyps Neg Hx     Social History Social History   Tobacco Use   Smoking status: Never   Smokeless tobacco: Never  Vaping Use   Vaping Use: Never used  Substance Use Topics   Alcohol use: Yes    Alcohol/week: 5.0 standard drinks    Types: 5 Cans of beer per week   Drug use: No     Allergies   Norvasc [amlodipine besylate]   Review of Systems Review of Systems  Constitutional:  Negative for chills and fever.  Genitourinary: Negative.   Skin:  Positive for color change and wound. Negative for pallor and rash.  Neurological: Negative.     Physical Exam Triage Vital Signs ED Triage Vitals  Enc Vitals Group     BP 11/18/21 1431 (!) 94/59     Pulse Rate 11/18/21 1431 90     Resp 11/18/21 1431 18     Temp 11/18/21 1431 97.7 F (36.5 C)     Temp Source 11/18/21 1431 Oral     SpO2 11/18/21 1431 99 %     Weight --      Height --      Head Circumference --      Peak Flow --      Pain Score 11/18/21 1434 8     Pain Loc --      Pain Edu? --      Excl. in [place]? --    No data found.  Updated Vital Signs BP 115/74 (BP Location: Right Arm)   Pulse 90   Temp 98 F (36.7 C) (Oral)   Resp 18   LMP 03/23/2015   SpO2  95%   Visual  Acuity Right Eye Distance:   Left Eye Distance:   Bilateral Distance:    Right Eye Near:   Left Eye Near:    Bilateral Near:     Physical Exam Vitals and nursing note reviewed.  Constitutional:      General: She is not in acute distress.    Appearance: She is not ill-appearing.  Cardiovascular:     Rate and Rhythm: Normal rate and regular rhythm.  Musculoskeletal:        General: No swelling or tenderness. Normal range of motion.  Skin:    General: Skin is warm.     Findings: Erythema present.     Comments: Anterior lateral left leg pain.  The wound is on the distal aspect of the left leg and measures about 2 inches in the longest diameter.  The base of the wound has some granulation tissue which looks unhealthy.  Skin flap is adherent to about 20% of the wound surface.  Surrounding skin has some erythema.  No discharge noted.  Neurological:     Mental Status: She is alert.        UC Treatments / Results  Labs (all labs ordered are listed, but only abnormal results are displayed) Labs Reviewed - No data to display  EKG   Radiology No results found.  Procedures Procedures (including critical care time)  Medications Ordered in UC Medications  cefTRIAXone (ROCEPHIN) injection 1 g (1 g Intramuscular Given 11/26/21 1114)    Initial Impression / Assessment and Plan / UC Course  I have reviewed the triage vital signs and the nursing notes.  Pertinent labs & imaging results that were available during my care of the patient were reviewed by me and considered in my medical decision making (see chart for details).  The patient presents for continued poor wound healing.  She was seen on 5/14, for a wound to her left lower extremity after her dog scratched her leg.  Patient has since been started on Keflex.  She returns today for continued infection of a left lower extremity wound.  The wound is dark, and has a erythematous base.  The area around the wound is warm to touch.   Patient was given Rocephin injection in the clinic today, we will also start her on doxycycline.  Patient was also referred to wound care for further evaluation.  Patient was given strict return precautions of when to follow-up in our clinic versus of when to go to the ER.  Patient verbalizes understanding.  All questions were answered. Final Clinical Impressions(s) / UC Diagnoses   Final diagnoses:  Wound of left leg, initial encounter     Discharge Instructions      Take medication as prescribed. Continue to keep the area clean and dry.  Recommend continued daily wound dressing changes.  May take ibuprofen or Tylenol as needed for pain. Follow-up immediately if you develop fever, chills, worsening redness, foul-smelling drainage, or other concerns. I have referred you to wound care for further evaluation.  Please call them today to make an appointment to be seen sometime this week.     ED Prescriptions     Medication Sig Dispense Auth. Provider   doxycycline (VIBRA-TABS) 100 MG tablet Take 1 tablet (100 mg total) by mouth 2 (two) times daily for 10 days. 20 tablet Daliana Leverett-Warren, Alda Lea, NP      PDMP not reviewed this encounter.   Chase Picket, MD 11/19/21 0930  Tish Men, NP 11/26/21 1120    Ruwayda Curet-Warren, Alda Lea, NP 12/05/21 0825

## 2021-11-26 NOTE — Progress Notes (Signed)
  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  Intent: Curative  Radiation Treatment Dates: 09/13/2021 through 10/11/2021 Site Technique Total Dose (Gy) Dose per Fx (Gy) Completed Fx Beam Energies  Breast, Left: Breast_L 3D 42.56/42.56 2.66 16/16 6XFFF  Breast, Left: Breast_L_Bst 3D 8/8 2 4/4 6X   Narrative: The patient tolerated radiation therapy relatively well. She developed fatigue and anticipated skin changes in the treatment field.    Impression/Plan: 1. 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  I was unable to reach the patient but left a voicemail and on the message, I discussed that we would be happy to continue to follow her as needed, but she will also continue to follow up with Dr. Chryl Heck in medical oncology. She was counseled to call if she had questions about skin care or measures to avoid sun exposure to this area.        Carola Rhine, PAC

## 2021-11-26 NOTE — ED Triage Notes (Signed)
Was seen on 5/14 for skin tear to left lower leg.  Has finished antibiotics and lower leg continues to remain red and painful.

## 2021-11-26 NOTE — Discharge Instructions (Addendum)
Take medication as prescribed. Continue to keep the area clean and dry.  Recommend continued daily wound dressing changes.  May take ibuprofen or Tylenol as needed for pain. Follow-up immediately if you develop fever, chills, worsening redness, foul-smelling drainage, or other concerns. I have referred you to wound care for further evaluation.  Please call them today to make an appointment to be seen sometime this week.

## 2021-11-28 ENCOUNTER — Encounter (HOSPITAL_BASED_OUTPATIENT_CLINIC_OR_DEPARTMENT_OTHER): Payer: 59 | Attending: General Surgery | Admitting: General Surgery

## 2021-11-28 DIAGNOSIS — Z853 Personal history of malignant neoplasm of breast: Secondary | ICD-10-CM | POA: Insufficient documentation

## 2021-11-28 DIAGNOSIS — I10 Essential (primary) hypertension: Secondary | ICD-10-CM | POA: Diagnosis not present

## 2021-11-28 DIAGNOSIS — L97822 Non-pressure chronic ulcer of other part of left lower leg with fat layer exposed: Secondary | ICD-10-CM | POA: Diagnosis not present

## 2021-11-29 DIAGNOSIS — L97822 Non-pressure chronic ulcer of other part of left lower leg with fat layer exposed: Secondary | ICD-10-CM | POA: Diagnosis not present

## 2021-11-29 NOTE — Progress Notes (Signed)
[name], [name] ([id number]) Visit Report for 11/28/2021 Chief Complaint Document Details Patient Name: Date of Service: NELA, BASCOM 11/28/2021 9:00 A M Medical Record Number: [id number] Patient Account Number: [id number] Date of Birth/Sex: Treating RN: 01/18/1961 (61 y.o. Erika Harvey Primary Care Provider: [name] Other Clinician: Referring Provider: Treating Provider/Extender: Mertha Finders in Treatment: 0 Information Obtained from: Patient Chief Complaint Patient seen for complaints of Non-Healing Wound. Electronic Signature(s) Signed: 11/28/2021 11:30:27 AM By: Fredirick Maudlin MD FACS Entered By: Fredirick Maudlin on 11/28/2021 11:30:27 -------------------------------------------------------------------------------- Debridement Details Patient Name: Date of Service: Erika Harvey A. 11/28/2021 9:00 A M Medical Record Number: [id number] Patient Account Number: [id number] Date of Birth/Sex: Treating RN: [birth year] (61 y.o. Erika Harvey Primary Care Provider: [name] Other Clinician: Referring Provider: Treating Provider/Extender: Mertha Finders in Treatment: 0 Debridement Performed for Assessment: Wound #1 Left,Lateral Lower Leg Performed By: Physician Fredirick Maudlin, MD Debridement Type: Debridement Level of Consciousness (Pre-procedure): Awake and Alert Pre-procedure Verification/Time Out Yes - 10:25 Taken: Start Time: 10:30 Pain Control: Lidocaine 4% T opical Solution T Area Debrided (L x W): otal 2.5 (cm) x 3.8 (cm) = 9.5 (cm) Tissue and other material debrided: Viable, Non-Viable, Eschar, Subcutaneous, Skin: Dermis , Skin: Epidermis Level: Skin/Subcutaneous Tissue Debridement Description: Excisional Instrument: Curette Bleeding: Minimum Hemostasis Achieved: Pressure Procedural Pain: 8 Post Procedural Pain: 5 Response to Treatment: Procedure was tolerated well Level of  Consciousness (Post- Awake and Alert procedure): Post Debridement Measurements of Total Wound Length: (cm) 2.5 Width: (cm) 3.8 Depth: (cm) 0.2 Volume: (cm) 1.492 Character of Wound/Ulcer Post Debridement: Requires Further Debridement Post Procedure Diagnosis Same as Pre-procedure Electronic Signature(s) Signed: 11/28/2021 11:45:05 AM By: Fredirick Maudlin MD FACS Signed: 11/29/2021 6:06:15 PM By: Baruch Gouty RN, BSN Entered By: Baruch Gouty on 11/28/2021 10:36:08 -------------------------------------------------------------------------------- HPI Details Patient Name: Date of Service: Erika Harvey A. 11/28/2021 9:00 A M Medical Record Number: [id number] Patient Account Number: [id number] Date of Birth/Sex: Treating RN: 1961/04/10 (61 y.o. Erika Harvey Primary Care Provider: [name] Other Clinician: Referring Provider: Treating Provider/Extender: Mertha Finders in Treatment: 0 History of Present Illness HPI Description: ADMISSION 11/28/2021 This is a 61 year old woman with a past medical history significant for hypertension and breast cancer. She suffered a laceration to her left lower extremity when her dog apparently clawed her as he was jumping up to chase a squirrel. She treated this at home with peroxide and topical antibiotic ointment but it did not get better. She presented to the emergency department on 18 Nov 2021, about a week after the injury occurred. The ED provider told her to stop using the hydrogen peroxide and prescribed a 1 week course of Keflex. She returned to the emergency department on May 22 with concern that the wound was more red, more swollen, and more painful. The emergency room gave her a shot of Rocephin and initiated doxycycline. She was referred to wound care for further evaluation and management. On the anterior tibial surface of her right leg, there is a wound in the shape of an inverted V with a flap of  nonviable skin loosely adherent to the surface. There is eschar and old hematoma accumulated on the wound. The periwound skin is intact. There is no erythema, induration, odor, or discharge. Electronic Signature(s) Signed: 11/28/2021 11:33:52 AM By: Fredirick Maudlin MD FACS Entered By: Fredirick Maudlin on 11/28/2021 11:33:52 -------------------------------------------------------------------------------- Physical Exam Details Patient Name: Date of Service: [name], [name]  11/28/2021 9:00 A M Medical Record Number: [id number] Patient Account Number: [id number] Date of Birth/Sex: Treating RN: [birth year] (61 y.o. Erika Harvey Primary Care Provider: [name] Other Clinician: Referring Provider: Treating Provider/Extender: Mertha Finders in Treatment: 0 Constitutional Mildly hypertensive. . . . No acute distress. Respiratory Normal work of breathing on room air.. Cardiovascular .Marland Kitchen Notes 11/28/2021:On the anterior tibial surface of her right leg, there is a wound in the shape of an inverted V with a flap of nonviable skin loosely adherent to the surface. There is eschar and old hematoma accumulated on the wound. The periwound skin is intact. There is no erythema, induration, odor, or discharge. Electronic Signature(s) Signed: 11/28/2021 11:34:36 AM By: Fredirick Maudlin MD FACS Entered By: Fredirick Maudlin on 11/28/2021 11:34:36 -------------------------------------------------------------------------------- Physician Orders Details Patient Name: Date of Service: Erika Harvey A. 11/28/2021 9:00 A M Medical Record Number: [id number] Patient Account Number: [id number] Date of Birth/Sex: Treating RN: [birth year] (61 y.o. Erika Harvey Primary Care Provider: [name] Other Clinician: Referring Provider: Treating Provider/Extender: Mertha Finders in Treatment: 0 Verbal / Phone Orders: No Diagnosis  Coding ICD-10 Coding Code Description I10 Essential (primary) hypertension L97.822 Non-pressure chronic ulcer of other part of left lower leg with fat layer exposed Follow-up Appointments ppointment in 1 week. - Dr. Celine Ahr RM 1 with [name] Return A Wed 5/31 @ 08:15 am Bathing/ Shower/ Hygiene May shower and wash wound with soap and water. - with dressing changes Wound Treatment Wound #1 - Lower Leg Wound Laterality: Left, Lateral Cleanser: Normal Saline (DME) (Generic) Every Other Day/30 Days Discharge Instructions: Cleanse the wound with Normal Saline prior to applying a clean dressing using gauze sponges, not tissue or cotton balls. Prim Dressing: Hydrofera Blue Classic Foam, 2x2 in (DME) (Generic) Every Other Day/30 Days ary Discharge Instructions: Moisten with saline prior to applying to wound bed Secondary Dressing: Bordered Gauze, 4x4 in (DME) (Generic) Every Other Day/30 Days Discharge Instructions: Apply over primary dressing as directed. Patient Medications llergies: amlodipine A Notifications Medication Indication Start End prior to debridement 11/28/2021 lidocaine DOSE topical 4 % cream - cream topical Electronic Signature(s) Signed: 11/28/2021 11:34:51 AM By: Fredirick Maudlin MD FACS Entered By: Fredirick Maudlin on 11/28/2021 11:34:50 -------------------------------------------------------------------------------- Problem List Details Patient Name: Date of Service: Erika Harvey A. 11/28/2021 9:00 A M Medical Record Number: [id number] Patient Account Number: [id number] Date of Birth/Sex: Treating RN: 1961/03/22 (61 y.o. Erika Harvey Primary Care Provider: [name] Other Clinician: Referring Provider: Treating Provider/Extender: Mertha Finders in Treatment: 0 Active Problems ICD-10 Encounter Code Description Active Date MDM Diagnosis I10 Essential (primary) hypertension 11/28/2021 No Yes L97.822 Non-pressure chronic  ulcer of other part of left lower leg with fat layer exposed5/24/2023 No Yes Inactive Problems Resolved Problems Electronic Signature(s) Signed: 11/28/2021 11:30:08 AM By: Fredirick Maudlin MD FACS Entered By: Fredirick Maudlin on 11/28/2021 11:30:08 -------------------------------------------------------------------------------- Progress Note Details Patient Name: Date of Service: Erika Harvey A. 11/28/2021 9:00 A M Medical Record Number: [id number] Patient Account Number: [id number] Date of Birth/Sex: Treating RN: 04-27-1961 (61 y.o. Erika Harvey Primary Care Provider: [name] Other Clinician: Referring Provider: Treating Provider/Extender: Mertha Finders in Treatment: 0 Subjective Chief Complaint Information obtained from Patient Patient seen for complaints of Non-Healing Wound. History of Present Illness (HPI) ADMISSION 11/28/2021 This is a 61 year old woman with a past medical history significant for hypertension and breast cancer. She suffered a laceration to her left lower extremity when her  dog apparently clawed her as he was jumping up to chase a squirrel. She treated this at home with peroxide and topical antibiotic ointment but it did not get better. She presented to the emergency department on 18 Nov 2021, about a week after the injury occurred. The ED provider told her to stop using the hydrogen peroxide and prescribed a 1 week course of Keflex. She returned to the emergency department on May 22 with concern that the wound was more red, more swollen, and more painful. The emergency room gave her a shot of Rocephin and initiated doxycycline. She was referred to wound care for further evaluation and management. On the anterior tibial surface of her right leg, there is a wound in the shape of an inverted V with a flap of nonviable skin loosely adherent to the surface. There is eschar and old hematoma accumulated on the wound. The  periwound skin is intact. There is no erythema, induration, odor, or discharge. Patient History Information obtained from Patient, Chart. Allergies amlodipine (Reaction: swelling) Family History Cancer - Mother, Diabetes - Father, Heart Disease - Father, Hypertension - Father, No family history of Hereditary Spherocytosis, Kidney Disease, Lung Disease, Seizures, Stroke, Thyroid Problems, Tuberculosis. Social History Never smoker, Marital Status - Married, Alcohol Use - Rarely, Drug Use - No History, Caffeine Use - Moderate - soda. Medical History Eyes Denies history of Cataracts, Glaucoma, Optic Neuritis Ear/Nose/Mouth/Throat Denies history of Chronic sinus problems/congestion, Middle ear problems Cardiovascular Patient has history of Hypertension Endocrine Denies history of Type I Diabetes, Type II Diabetes Genitourinary Denies history of End Stage Renal Disease Integumentary (Skin) Denies history of History of Burn Musculoskeletal Denies history of Gout, Rheumatoid Arthritis, Osteoarthritis, Osteomyelitis Oncologic Patient has history of Received Radiation Denies history of Received Chemotherapy Psychiatric Denies history of Anorexia/bulimia, Confinement Anxiety Hospitalization/Surgery History - breast lumpectomy. - lumbar surgery. - left knee surgery. - wisdom tooth extraction. Medical A Surgical History Notes nd Gastrointestinal GERD Musculoskeletal lumbar herniated disc, lumbar DDD Oncologic hx breast CA Review of Systems (ROS) Constitutional Symptoms (General Health) Denies complaints or symptoms of Fatigue, Fever, Chills, Marked Weight Change. Eyes Complains or has symptoms of Dry Eyes, Glasses / Contacts - reading. Denies complaints or symptoms of Vision Changes. Ear/Nose/Mouth/Throat Denies complaints or symptoms of Chronic sinus problems or rhinitis. Respiratory Denies complaints or symptoms of Chronic or frequent coughs, Shortness of  Breath. Cardiovascular Denies complaints or symptoms of Chest pain. Gastrointestinal Denies complaints or symptoms of Frequent diarrhea, Nausea, Vomiting. Endocrine Denies complaints or symptoms of Heat/cold intolerance. Genitourinary Denies complaints or symptoms of Frequent urination. Integumentary (Skin) Complains or has symptoms of Wounds - left lower leg. Musculoskeletal Denies complaints or symptoms of Muscle Pain, Muscle Weakness. Neurologic Denies complaints or symptoms of Numbness/parasthesias. Psychiatric Denies complaints or symptoms of Claustrophobia, Suicidal. Objective Constitutional Mildly hypertensive. No acute distress. Vitals Time Taken: 9:52 AM, Height: 63 in, Source: Stated, Weight: 144 lbs, Source: Stated, BMI: 25.5, Temperature: 98.6 F, Pulse: 82 bpm, Respiratory Rate: 18 breaths/min, Blood Pressure: 146/75 mmHg. Respiratory Normal work of breathing on room air.. General Notes: 11/28/2021:On the anterior tibial surface of her right leg, there is a wound in the shape of an inverted V with a flap of nonviable skin loosely adherent to the surface. There is eschar and old hematoma accumulated on the wound. The periwound skin is intact. There is no erythema, induration, odor, or discharge. Integumentary (Hair, Skin) Wound #1 status is Open. Original cause of wound was Trauma. The date acquired was: 11/07/2021.  The wound is located on the Left,Lateral Lower Leg. The wound measures 2.5cm length x 3.8cm width x 0.2cm depth; 7.461cm^2 area and 1.492cm^3 volume. There is Fat Layer (Subcutaneous Tissue) exposed. There is no tunneling or undermining noted. There is a medium amount of serosanguineous drainage noted. The wound margin is flat and intact. There is no granulation within the wound bed. There is a large (67-100%) amount of necrotic tissue within the wound bed including Eschar. Assessment Active Problems ICD-10 Essential (primary) hypertension Non-pressure  chronic ulcer of other part of left lower leg with fat layer exposed Procedures Wound #1 Pre-procedure diagnosis of Wound #1 is a Trauma, Other located on the Left,Lateral Lower Leg . There was a Excisional Skin/Subcutaneous Tissue Debridement with a total area of 9.5 sq cm performed by Fredirick Maudlin, MD. With the following instrument(s): Curette to remove Viable and Non-Viable tissue/material. Material removed includes Eschar, Subcutaneous Tissue, Skin: Dermis, and Skin: Epidermis after achieving pain control using Lidocaine 4% T opical Solution. No specimens were taken. A time out was conducted at 10:25, prior to the start of the procedure. A Minimum amount of bleeding was controlled with Pressure. The procedure was tolerated well with a pain level of 8 throughout and a pain level of 5 following the procedure. Post Debridement Measurements: 2.5cm length x 3.8cm width x 0.2cm depth; 1.492cm^3 volume. Character of Wound/Ulcer Post Debridement requires further debridement. Post procedure Diagnosis Wound #1: Same as Pre-Procedure Plan Follow-up Appointments: Return Appointment in 1 week. - Dr. Celine Ahr RM 1 with Delice Lesch 5/31 @ 08:15 am Bathing/ Shower/ Hygiene: May shower and wash wound with soap and water. - with dressing changes The following medication(s) was prescribed: lidocaine topical 4 % cream cream topical for prior to debridement was prescribed at facility WOUND #1: - Lower Leg Wound Laterality: Left, Lateral Cleanser: Normal Saline (DME) (Generic) Every Other Day/30 Days Discharge Instructions: Cleanse the wound with Normal Saline prior to applying a clean dressing using gauze sponges, not tissue or cotton balls. Prim Dressing: Hydrofera Blue Classic Foam, 2x2 in (DME) (Generic) Every Other Day/30 Days ary Discharge Instructions: Moisten with saline prior to applying to wound bed Secondary Dressing: Bordered Gauze, 4x4 in (DME) (Generic) Every Other Day/30 Days Discharge  Instructions: Apply over primary dressing as directed. 11/28/2021: This is a 61 year old woman who had a laceration on her right lower leg secondary to her dog's toenails. The wound has failed to heal despite antibiotic therapy. On the anterior tibial surface of her right leg, there is a wound in the shape of an inverted V with a flap of nonviable skin loosely adherent to the surface. There is eschar and old hematoma accumulated on the wound. The periwound skin is intact. There is no erythema, induration, odor, or discharge. I used a curette to remove the eschar and old hematoma. I used Metzenbaum scissors to excise the flap of nonviable skin. We will use Hydrofera Blue classic with a bordered gauze dressing. Follow-up in 1 week. Electronic Signature(s) Signed: 11/28/2021 11:35:59 AM By: Fredirick Maudlin MD FACS Entered By: Fredirick Maudlin on 11/28/2021 11:35:59 -------------------------------------------------------------------------------- HxROS Details Patient Name: Date of Service: Erika Harvey A. 11/28/2021 9:00 A M Medical Record Number: [id number] Patient Account Number: [id number] Date of Birth/Sex: Treating RN: April 16, 1961 (61 y.o. Erika Harvey Primary Care Provider: [name] Other Clinician: Referring Provider: Treating Provider/Extender: Mertha Finders in Treatment: 0 Information Obtained From Patient Chart Constitutional Symptoms (General Health) Complaints and Symptoms: Negative for: Fatigue; Fever; Chills;  Marked Weight Change Eyes Complaints and Symptoms: Positive for: Dry Eyes; Glasses / Contacts - reading Negative for: Vision Changes Medical History: Negative for: Cataracts; Glaucoma; Optic Neuritis Ear/Nose/Mouth/Throat Complaints and Symptoms: Negative for: Chronic sinus problems or rhinitis Medical History: Negative for: Chronic sinus problems/congestion; Middle ear problems Respiratory Complaints and  Symptoms: Negative for: Chronic or frequent coughs; Shortness of Breath Cardiovascular Complaints and Symptoms: Negative for: Chest pain Medical History: Positive for: Hypertension Gastrointestinal Complaints and Symptoms: Negative for: Frequent diarrhea; Nausea; Vomiting Medical History: Past Medical History Notes: GERD Endocrine Complaints and Symptoms: Negative for: Heat/cold intolerance Medical History: Negative for: Type I Diabetes; Type II Diabetes Genitourinary Complaints and Symptoms: Negative for: Frequent urination Medical History: Negative for: End Stage Renal Disease Integumentary (Skin) Complaints and Symptoms: Positive for: Wounds - left lower leg Medical History: Negative for: History of Burn Musculoskeletal Complaints and Symptoms: Negative for: Muscle Pain; Muscle Weakness Medical History: Negative for: Gout; Rheumatoid Arthritis; Osteoarthritis; Osteomyelitis Past Medical History Notes: lumbar herniated disc, lumbar DDD Neurologic Complaints and Symptoms: Negative for: Numbness/parasthesias Psychiatric Complaints and Symptoms: Negative for: Claustrophobia; Suicidal Medical History: Negative for: Anorexia/bulimia; Confinement Anxiety Hematologic/Lymphatic Immunological Oncologic Medical History: Positive for: Received Radiation Negative for: Received Chemotherapy Past Medical History Notes: hx breast CA Immunizations Pneumococcal Vaccine: Received Pneumococcal Vaccination: No Implantable Devices No devices added Hospitalization / Surgery History Type of Hospitalization/Surgery breast lumpectomy lumbar surgery left knee surgery wisdom tooth extraction Family and Social History Cancer: Yes - Mother; Diabetes: Yes - Father; Heart Disease: Yes - Father; Hereditary Spherocytosis: No; Hypertension: Yes - Father; Kidney Disease: No; Lung Disease: No; Seizures: No; Stroke: No; Thyroid Problems: No; Tuberculosis: No; Never smoker; Marital Status  - Married; Alcohol Use: Rarely; Drug Use: No History; Caffeine Use: Moderate - soda; Financial Concerns: No; Food, Clothing or Shelter Needs: No; Support System Lacking: No; Transportation Concerns: No Electronic Signature(s) Signed: 11/28/2021 11:45:05 AM By: Fredirick Maudlin MD FACS Signed: 11/29/2021 6:06:15 PM By: Baruch Gouty RN, BSN Entered By: Baruch Gouty on 11/28/2021 10:05:00 -------------------------------------------------------------------------------- SuperBill Details Patient Name: Date of Service: Erika Harvey A. 11/28/2021 Medical Record Number: [id number] Patient Account Number: [id number] Date of Birth/Sex: Treating RN: [birth year] (61 y.o. Erika Harvey Primary Care Provider: [name] Other Clinician: Referring Provider: Treating Provider/Extender: Mertha Finders in Treatment: 0 Diagnosis Coding ICD-10 Codes Code Description I10 Essential (primary) hypertension [phone] Non-pressure chronic ulcer of other part of left lower leg with fat layer exposed Facility Procedures CPT4 Code: [id number] Description: [facility] VISIT-LEV 3 EST PT Modifier: 25 Quantity: 1 CPT4 Code: [id number] Description: 03474 - DEB SUBQ TISSUE 20 SQ CM/< ICD-10 Diagnosis Description L97.822 Non-pressure chronic ulcer of other part of left lower leg with fat layer expose Modifier: d Quantity: 1 Physician Procedures : CPT4 Code Description Modifier [id number] - WC PHYS LEVEL 4 - NEW PT 25 ICD-10 Diagnosis Description L97.822 Non-pressure chronic ulcer of other part of left lower leg with fat layer exposed I10 Essential (primary) hypertension Quantity: 1 : [id number] - WC PHYS SUBQ TISS 20 SQ CM ICD-10 Diagnosis Description L97.822 Non-pressure chronic ulcer of other part of left lower leg with fat layer exposed Quantity: 1 Electronic Signature(s) Signed: 11/28/2021 12:40:26 PM By: Fredirick Maudlin MD FACS Signed: 11/29/2021 6:06:15 PM  By: Baruch Gouty RN, BSN Previous Signature: 11/28/2021 11:36:14 AM Version By: Fredirick Maudlin MD FACS Entered By: Baruch Gouty on 11/28/2021 11:45:30

## 2021-11-29 NOTE — Progress Notes (Signed)
Erika Harvey, Erika Harvey ([id number]) Visit Report for 11/28/2021 Abuse Risk Screen Details Patient Name: Date of Service: Erika Harvey, Erika Harvey 11/28/2021 9:00 A M Medical Record Number: [id number] Patient Account Number: [id number] Date of Birth/Sex: Treating RN: April 20, 1961 (61 y.o. Elam Dutch Primary Care Petrea Fredenburg: [name] Other Clinician: Referring Teriah Muela: Treating Syra Sirmons/Extender: Mertha Finders in Treatment: 0 Abuse Risk Screen Items Answer ABUSE RISK SCREEN: Has anyone close to you tried to hurt or harm you recentlyo No Do you feel uncomfortable with anyone in your familyo No Has anyone forced you do things that you didnt want to doo No Electronic Signature(s) Signed: 11/29/2021 6:06:15 PM By: Baruch Gouty RN, BSN Entered By: Baruch Gouty on 11/28/2021 10:05:09 -------------------------------------------------------------------------------- Activities of Daily Living Details Patient Name: Date of Service: Erika Harvey, Erika Harvey 11/28/2021 9:00 [place] Record Number: [id number] Patient Account Number: [id number] Date of Birth/Sex: Treating RN: Mar 28, 1961 (61 y.o. Elam Dutch Primary Care Yamina Lenis: [name] Other Clinician: Referring Adeliz Tonkinson: Treating Leonora Gores/Extender: Mertha Finders in Treatment: 0 Activities of Daily Living Items Answer Activities of Daily Living (Please select one for each item) Drive Automobile Completely Able T Medications ake Completely Able Use T elephone Completely Able Care for Appearance Completely Able Use T oilet Completely Able Bath / Shower Completely Able Dress Self Completely Able Feed Self Completely Able Walk Completely Able Get In / Out Bed Completely Able Housework Completely Able Prepare Meals Completely [place] for Self Completely Able Electronic Signature(s) Signed: 11/29/2021 6:06:15 PM By: Baruch Gouty RN,  BSN Entered By: Baruch Gouty on 11/28/2021 10:05:29 -------------------------------------------------------------------------------- Education Screening Details Patient Name: Date of Service: Erika Councilman A. 11/28/2021 9:00 A M Medical Record Number: [id number] Patient Account Number: [id number] Date of Birth/Sex: Treating RN: Jan 13, 1961 (61 y.o. Elam Dutch Primary Care Glynda Soliday: [name] Other Clinician: Referring Genesys Coggeshall: Treating Drexler Maland/Extender: Mertha Finders in Treatment: 0 Primary Learner Assessed: Patient Learning Preferences/Education Level/Primary Language Learning Preference: Explanation, Demonstration, Printed Material Highest Education Level: High School Preferred Language: English Cognitive Barrier Language Barrier: No Translator Needed: No Memory Deficit: No Emotional Barrier: No Cultural/Religious Beliefs Affecting Medical Care: No Physical Barrier Impaired Vision: No Impaired Hearing: No Decreased Hand dexterity: No Knowledge/Comprehension Knowledge Level: High Comprehension Level: High Ability to understand written instructions: High Ability to understand verbal instructions: High Motivation Anxiety Level: Calm Cooperation: Cooperative Education Importance: Acknowledges Need Interest in Health Problems: Asks Questions Perception: Coherent Willingness to Engage in Self-Management High Activities: Readiness to Engage in Self-Management High Activities: Electronic Signature(s) Signed: 11/29/2021 6:06:15 PM By: Baruch Gouty RN, BSN Entered By: Baruch Gouty on 11/28/2021 10:05:51 -------------------------------------------------------------------------------- Fall Risk Assessment Details Patient Name: Date of Service: Erika Councilman A. 11/28/2021 9:00 A M Medical Record Number: [id number] Patient Account Number: [id number] Date of Birth/Sex: Treating RN: 08-Nov-1960 (60 y.o. Elam Dutch Primary Care Jahyra Sukup: [name] Other Clinician: Referring Rosaire Cueto: Treating [place] Sloop/Extender: Mertha Finders in Treatment: 0 Fall Risk Assessment Items Have you had 2 or more falls in the last 12 monthso 0 No Have you had any fall that resulted in injury in the last 12 monthso 0 No FALLS RISK SCREEN History of falling - immediate or within 3 months 0 No Secondary diagnosis (Do you have 2 or more medical diagnoseso) 0 No Ambulatory aid None/bed rest/wheelchair/nurse 0 Yes Crutches/cane/walker 0 No Furniture 0 No Intravenous therapy Access/Saline/Heparin Lock 0 No Gait/Transferring Normal/ bed rest/ wheelchair 0 Yes  Weak (short steps with or without shuffle, stooped but able to lift head while walking, may seek 0 No support from furniture) Impaired (short steps with shuffle, may have difficulty arising from chair, head down, impaired 0 No balance) Mental Status Oriented to own ability 0 Yes Electronic Signature(s) Signed: 11/29/2021 6:06:15 PM By: Baruch Gouty RN, BSN Entered By: Baruch Gouty on 11/28/2021 10:06:11 -------------------------------------------------------------------------------- Foot Assessment Details Patient Name: Date of Service: Erika Councilman A. 11/28/2021 9:00 [place] Record Number: [id number] Patient Account Number: [id number] Date of Birth/Sex: Treating RN: 04-20-1961 (61 y.o. Elam Dutch Primary Care Meleny Tregoning: [name] Other Clinician: Referring Cap Massi: Treating Marquis Down/Extender: Mertha Finders in Treatment: 0 Foot Assessment Items Site Locations + = Sensation present, - = Sensation absent, C = Callus, U = Ulcer R = Redness, W = Warmth, M = Maceration, PU = Pre-ulcerative lesion F = Fissure, S = Swelling, D = Dryness Assessment Right: Left: Other Deformity: No No Prior Foot Ulcer: No No Prior Amputation: No No Charcot Joint: No No Ambulatory Status:  Ambulatory Without Help Gait: Steady Electronic Signature(s) Signed: 11/29/2021 6:06:15 PM By: Baruch Gouty RN, BSN Entered By: Baruch Gouty on 11/28/2021 10:07:17 -------------------------------------------------------------------------------- Nutrition Risk Screening Details Patient Name: Date of Service: Erika Councilman A. 11/28/2021 9:00 [place] Record Number: [id number] Patient Account Number: [id number] Date of Birth/Sex: Treating RN: [birth year] (61 y.o. Elam Dutch Primary Care Cheris Tweten: [name] Other Clinician: Referring Alhassan Everingham: Treating Adrick Kestler/Extender: Mertha Finders in Treatment: 0 Height (in): 63 Weight (lbs): 144 Body Mass Index (BMI): 25.5 Nutrition Risk Screening Items Score Screening NUTRITION RISK SCREEN: I have an illness or condition that made me change the kind and/or amount of food I eat 0 No I eat fewer than two meals per day 3 Yes I eat few fruits and vegetables, or milk products 2 Yes I have three or more drinks of beer, liquor or wine almost every day 0 No I have tooth or mouth problems that make it hard for me to eat 0 No I don't always have enough money to buy the food I need 0 No I eat alone most of the time 0 No I take three or more different prescribed or over-the-counter drugs a day 1 Yes Without wanting to, I have lost or gained 10 pounds in the last six months 0 No I am not always physically able to shop, cook and/or feed myself 0 No Nutrition Protocols Good Risk Protocol Moderate Risk Protocol High Risk Proctocol 0 Provide education on nutrition Risk Level: High Risk Score: 6 Electronic Signature(s) Signed: 11/29/2021 6:06:15 PM By: Baruch Gouty RN, BSN Entered By: Baruch Gouty on 11/28/2021 10:07:09

## 2021-11-29 NOTE — Progress Notes (Signed)
Erika Harvey, Erika Harvey ([id number]) Visit Report for 11/28/2021 Allergy List Details Patient Name: Date of Service: Erika Harvey, Erika Harvey 11/28/2021 9:00 A M Medical Record Number: [id number] Patient Account Number: [id number] Date of Birth/Sex: Treating RN: 1961-03-05 (61 y.o. Erika Harvey Primary Care Garry Nicolini: [name] Other Clinician: Referring Aletheia Tangredi: Treating Sarvesh Meddaugh/Extender: Mertha Finders in Treatment: 0 Allergies Active Allergies amlodipine Reaction: swelling Allergy Notes Electronic Signature(s) Signed: 11/29/2021 6:06:15 PM By: Baruch Gouty RN, BSN Entered By: Baruch Gouty on 11/28/2021 09:56:55 -------------------------------------------------------------------------------- Arrival Information Details Patient Name: Date of Service: Erika Councilman A. 11/28/2021 9:00 A M Medical Record Number: [id number] Patient Account Number: [id number] Date of Birth/Sex: Treating RN: Jun 29, 1961 (61 y.o. Erika Harvey Primary Care Kathyjo Briere: [name] Other Clinician: Referring Stanford Strauch: Treating Braylin Xu/Extender: Mertha Finders in Treatment: 0 Visit Information Patient Arrived: Ambulatory Arrival Time: 09:48 Accompanied By: wife Transfer Assistance: None Patient Identification Verified: Yes Secondary Verification Process Completed: Yes Electronic Signature(s) Signed: 11/28/2021 3:12:02 PM By: Sandre Kitty Entered By: Sandre Kitty on 11/28/2021 09:52:45 -------------------------------------------------------------------------------- Clinic Level of Care Assessment Details Patient Name: Date of Service: Erika Harvey 11/28/2021 9:00 A M Medical Record Number: [id number] Patient Account Number: [id number] Date of Birth/Sex: Treating RN: 03-31-1961 (61 y.o. Erika Harvey Primary Care Jesus Nevills: Other Clinician: [name] Referring Leonell Lobdell: Treating Jenika Chiem/Extender: Mertha Finders in Treatment: 0 Clinic Level of Care Assessment Items TOOL 1 Quantity Score '[]'$  - 0 Use when EandM and Procedure is performed on INITIAL visit ASSESSMENTS - Nursing Assessment / Reassessment X- 1 20 General Physical Exam (combine w/ comprehensive assessment (listed just below) when performed on new pt. evals) X- 1 25 Comprehensive Assessment (HX, ROS, Risk Assessments, Wounds Hx, etc.) ASSESSMENTS - Wound and Skin Assessment / Reassessment '[]'$  - 0 Dermatologic / Skin Assessment (not related to wound area) ASSESSMENTS - Ostomy and/or Continence Assessment and Care '[]'$  - 0 Incontinence Assessment and Management '[]'$  - 0 Ostomy Care Assessment and Management (repouching, etc.) PROCESS - Coordination of Care X - Simple Patient / Family Education for ongoing care 1 15 '[]'$  - 0 Complex (extensive) Patient / Family Education for ongoing care X- 1 10 Staff obtains Programmer, systems, Records, T Results / Process Orders est '[]'$  - 0 Staff telephones HHA, Nursing Homes / Clarify orders / etc '[]'$  - 0 Routine Transfer to another Facility (non-emergent condition) '[]'$  - 0 Routine Hospital Admission (non-emergent condition) X- 1 15 New Admissions / Biomedical engineer / Ordering NPWT Apligraf, etc. , '[]'$  - 0 Emergency Hospital Admission (emergent condition) PROCESS - Special Needs '[]'$  - 0 Pediatric / Minor Patient Management '[]'$  - 0 Isolation Patient Management '[]'$  - 0 Hearing / Language / Visual special needs '[]'$  - 0 Assessment of Community assistance (transportation, D/C planning, etc.) '[]'$  - 0 Additional assistance / Altered mentation '[]'$  - 0 Support Surface(s) Assessment (bed, cushion, seat, etc.) INTERVENTIONS - Miscellaneous '[]'$  - 0 External ear exam '[]'$  - 0 Patient Transfer (multiple staff / Civil Service fast streamer / Similar devices) '[]'$  - 0 Simple Staple / Suture removal (25 or less) '[]'$  - 0 Complex Staple / Suture removal (26 or more) '[]'$  - 0 Hypo/Hyperglycemic Management  (do not check if billed separately) X- 1 15 Ankle / Brachial Index (ABI) - do not check if billed separately Has the patient been seen at the hospital within the last three years: Yes Total Score: 100 Level Of Care: New/Established - Level 3 Electronic Signature(s) Signed: 11/29/2021 6:06:15 PM By:  Baruch Gouty RN, BSN Entered By: Baruch Gouty on 11/28/2021 10:40:11 -------------------------------------------------------------------------------- Encounter Discharge Information Details Patient Name: Date of Service: Erika Harvey, Erika Harvey 11/28/2021 9:00 A M Medical Record Number: [id number] Patient Account Number: [id number] Date of Birth/Sex: Treating RN: 12-Jun-1961 (61 y.o. Erika Harvey Primary Care Linnaea Ahn: [name] Other Clinician: Referring Airica Schwartzkopf: Treating Hades Mathew/Extender: Mertha Finders in Treatment: 0 Encounter Discharge Information Items Post Procedure Vitals Discharge Condition: Stable Temperature (F): 98.6 Ambulatory Status: Ambulatory Pulse (bpm): 82 Discharge Destination: Home Respiratory Rate (breaths/min): 18 Transportation: Private Auto Blood Pressure (mmHg): 146/75 Accompanied By: spouse Schedule Follow-up Appointment: Yes Clinical Summary of Care: Patient Declined Electronic Signature(s) Signed: 11/29/2021 6:06:15 PM By: Baruch Gouty RN, BSN Entered By: Baruch Gouty on 11/28/2021 11:43:37 -------------------------------------------------------------------------------- Lower Extremity Assessment Details Patient Name: Date of Service: Erika Councilman A. 11/28/2021 9:00 A M Medical Record Number: [id number] Patient Account Number: [id number] Date of Birth/Sex: Treating RN: Oct 13, 1960 (61 y.o. Erika Harvey Primary Care Aqueelah Cotrell: [name] Other Clinician: Referring Danaya Geddis: Treating Yula Crotwell/Extender: Mertha Finders in Treatment: 0 Edema Assessment Assessed: [Left:  No] [Right: No] Edema: [Left: N] [Right: o] Calf Left: Right: Point of Measurement: From Medial Instep 31.5 cm Ankle Left: Right: Point of Measurement: From Medial Instep 23 cm Vascular Assessment Pulses: Dorsalis Pedis Palpable: [Left:Yes] Blood Pressure: Brachial: [Left:146] Ankle: [Left:Dorsalis Pedis: 162 1.11] Electronic Signature(s) Signed: 11/29/2021 6:06:15 PM By: Baruch Gouty RN, BSN Entered By: Baruch Gouty on 11/28/2021 10:12:13 -------------------------------------------------------------------------------- Multi Wound Chart Details Patient Name: Date of Service: Erika Councilman A. 11/28/2021 9:00 A M Medical Record Number: [id number] Patient Account Number: [id number] Date of Birth/Sex: Treating RN: February 23, 1961 (61 y.o. Erika Harvey Primary Care Colbi Staubs: [name] Other Clinician: Referring Peggyann Zwiefelhofer: Treating Ngozi Alvidrez/Extender: Mertha Finders in Treatment: 0 Vital Signs Height(in): 63 Pulse(bpm): 46 Weight(lbs): 144 Blood Pressure(mmHg): 146/75 Body Mass Index(BMI): 25.5 Temperature(F): 98.6 Respiratory Rate(breaths/min): 18 Photos: [N/A:N/A] Left, Lateral Lower Leg N/A N/A Wound Location: Trauma N/A N/A Wounding Event: Trauma, Other N/A N/A Primary Etiology: Hypertension, Received Radiation N/A N/A Comorbid History: 11/07/2021 N/A N/A Date Acquired: 0 N/A N/A Weeks of Treatment: Open N/A N/A Wound Status: No N/A N/A Wound Recurrence: 2.5x3.8x0.2 N/A N/A Measurements L x W x D (cm) 7.461 N/A N/A A (cm) : rea 1.492 N/A N/A Volume (cm) : 0.00% N/A N/A % Reduction in A rea: 0.00% N/A N/A % Reduction in Volume: Full Thickness Without Exposed N/A N/A Classification: Support Structures Medium N/A N/A Exudate A mount: Serosanguineous N/A N/A Exudate Type: red, brown N/A N/A Exudate Color: Flat and Intact N/A N/A Wound Margin: None Present (0%) N/A N/A Granulation A mount: Large  (67-100%) N/A N/A Necrotic A mount: Eschar N/A N/A Necrotic Tissue: Fat Layer (Subcutaneous Tissue): Yes N/A N/A Exposed Structures: Fascia: No Tendon: No Muscle: No Joint: No Bone: No None N/A N/A Epithelialization: Debridement - Excisional N/A N/A Debridement: Pre-procedure Verification/Time Out 10:25 N/A N/A Taken: Lidocaine 4% Topical Solution N/A N/A Pain Control: Necrotic/Eschar, Subcutaneous N/A N/A Tissue Debrided: Skin/Subcutaneous Tissue N/A N/A Level: 9.5 N/A N/A Debridement A (sq cm): rea Curette N/A N/A Instrument: Minimum N/A N/A Bleeding: Pressure N/A N/A Hemostasis A chieved: 8 N/A N/A Procedural Pain: 5 N/A N/A Post Procedural Pain: Procedure was tolerated well N/A N/A Debridement Treatment Response: 2.5x3.8x0.2 N/A N/A Post Debridement Measurements L x W x D (cm) 1.492 N/A N/A Post Debridement Volume: (cm) Debridement N/A N/A Procedures Performed: Treatment Notes Electronic Signature(s) Signed: 11/28/2021  11:30:15 AM By: Fredirick Maudlin MD FACS Signed: 11/29/2021 6:06:15 PM By: Baruch Gouty RN, BSN Entered By: Fredirick Maudlin on 11/28/2021 11:30:15 -------------------------------------------------------------------------------- Multi-Disciplinary Care Plan Details Patient Name: Date of Service: Erika Councilman A. 11/28/2021 9:00 A M Medical Record Number: [id number] Patient Account Number: [id number] Date of Birth/Sex: Treating RN: [birth year] (61 y.o. Erika Harvey Primary Care Caree Wolpert: [name] Other Clinician: Referring Gerber Penza: Treating Roniesha Hollingshead/Extender: Mertha Finders in Treatment: 0 Multidisciplinary Care Plan reviewed with physician Active Inactive Nutrition Nursing Diagnoses: Imbalanced nutrition Potential for alteratiion in Nutrition/Potential for imbalanced nutrition Goals: Patient/caregiver agrees to and verbalizes understanding of need to use nutritional supplements and/or  vitamins as prescribed Date Initiated: 11/28/2021 Target Resolution Date: 12/26/2021 Goal Status: Active Interventions: Assess patient nutrition upon admission and as needed per policy Treatment Activities: Patient referred to Primary Care Physician for further nutritional evaluation : 11/28/2021 Notes: Wound/Skin Impairment Nursing Diagnoses: Impaired tissue integrity Knowledge deficit related to ulceration/compromised skin integrity Goals: Patient/caregiver will verbalize understanding of skin care regimen Date Initiated: 11/28/2021 Target Resolution Date: 12/26/2021 Goal Status: Active Ulcer/skin breakdown will have a volume reduction of 30% by week 4 Date Initiated: 11/28/2021 Target Resolution Date: 12/26/2021 Goal Status: Active Interventions: Assess patient/caregiver ability to obtain necessary supplies Assess patient/caregiver ability to perform ulcer/skin care regimen upon admission and as needed Assess ulceration(s) every visit Provide education on ulcer and skin care Treatment Activities: Skin care regimen initiated : 11/28/2021 Topical wound management initiated : 11/28/2021 Notes: Electronic Signature(s) Signed: 11/29/2021 6:06:15 PM By: Baruch Gouty RN, BSN Entered By: Baruch Gouty on 11/28/2021 10:38:13 -------------------------------------------------------------------------------- Pain Assessment Details Patient Name: Date of Service: Erika Councilman A. 11/28/2021 9:00 [place] Record Number: [id number] Patient Account Number: [id number] Date of Birth/Sex: Treating RN: Jul 16, 1960 (61 y.o. Erika Harvey Primary Care Gao Mitnick: [name] Other Clinician: Referring Syaire Saber: Treating Miranda Garber/Extender: Mertha Finders in Treatment: 0 Active Problems Location of Pain Severity and Description of Pain Patient Has Paino Yes Site Locations Pain Location: Pain in Ulcers With Dressing Change: Yes Rate the pain. Current  Pain Level: 7 Worst Pain Level: 8 Least Pain Level: 0 Character of Pain Describe the Pain: Aching, Sharp Pain Management and Medication Current Pain Management: Medication: Yes Is the Current Pain Management Adequate: Adequate How does your wound impact your activities of daily livingo Sleep: No Bathing: No Appetite: No Relationship With Others: No Bladder Continence: No Emotions: Yes Bowel Continence: No Work: No Toileting: No Drive: No Dressing: No Hobbies: No Engineer, maintenance) Signed: 11/29/2021 6:06:15 PM By: Baruch Gouty RN, BSN Entered By: Baruch Gouty on 11/28/2021 10:17:50 -------------------------------------------------------------------------------- Patient/Caregiver Education Details Patient Name: Date of Service: Erika Harvey 5/24/2023andnbsp9:00 A M Medical Record Number: [id number] Patient Account Number: [id number] Date of Birth/Gender: Treating RN: 1960-10-17 (61 y.o. Erika Harvey Primary Care Physician: [name] Other Clinician: Referring Physician: Treating Physician/Extender: Mertha Finders in Treatment: 0 Education Assessment Education Provided To: Patient Education Topics Provided Welcome T [facility] [place]: o Handouts: Welcome T [facility] [place] o Methods: Printed Responses: Reinforcements needed, State content correctly Wound Debridement: Handouts: Wound Debridement Methods: Explain/Verbal, Printed Responses: Reinforcements needed, State content correctly Wound/Skin Impairment: Handouts: Caring for Your Ulcer, Skin Care Do's and Dont's Methods: Explain/Verbal, Printed Responses: Reinforcements needed, State content correctly Electronic Signature(s) Signed: 11/29/2021 6:06:15 PM By: Baruch Gouty RN, BSN Entered By: Baruch Gouty on 11/28/2021 10:39:29 -------------------------------------------------------------------------------- Wound Assessment Details Patient  Name: Date of Service: Erika Harvey, Erika A. 11/28/2021 9:00 A M Medical Record Number: [id number] Patient Account Number: [id number] Date of Birth/Sex: Treating RN: May 08, 1961 (61 y.o. Erika Harvey Primary Care Jeriko Kowalke: [name] Other Clinician: Referring Aleister Lady: Treating Levert Heslop/Extender: Mertha Finders in Treatment: 0 Wound Status Wound Number: 1 Primary Etiology: Trauma, Other Wound Location: Left, Lateral Lower Leg Wound Status: Open Wounding Event: Trauma Comorbid History: Hypertension, Received Radiation Date Acquired: 11/07/2021 Weeks Of Treatment: 0 Clustered Wound: No Photos Wound Measurements Length: (cm) 2.5 Width: (cm) 3.8 Depth: (cm) 0.2 Area: (cm) 7.461 Volume: (cm) 1.492 % Reduction in Area: 0% % Reduction in Volume: 0% Epithelialization: None Tunneling: No Undermining: No Wound Description Classification: Full Thickness Without Exposed Support Structures Wound Margin: Flat and Intact Exudate Amount: Medium Exudate Type: Serosanguineous Exudate Color: red, brown Foul Odor After Cleansing: No Slough/Fibrino Yes Wound Bed Granulation Amount: None Present (0%) Exposed Structure Necrotic Amount: Large (67-100%) Fascia Exposed: No Necrotic Quality: Eschar Fat Layer (Subcutaneous Tissue) Exposed: Yes Tendon Exposed: No Muscle Exposed: No Joint Exposed: No Bone Exposed: No Treatment Notes Wound #1 (Lower Leg) Wound Laterality: Left, Lateral Cleanser Normal Saline Discharge Instruction: Cleanse the wound with Normal Saline prior to applying a clean dressing using gauze sponges, not tissue or cotton balls. Peri-Wound Care Topical Primary Dressing Hydrofera Blue Classic Foam, 2x2 in Discharge Instruction: Moisten with saline prior to applying to wound bed Secondary Dressing Bordered Gauze, 4x4 in Discharge Instruction: Apply over primary dressing as directed. Secured With Compression Wrap Compression  Stockings Environmental education officer) Signed: 11/29/2021 6:06:15 PM By: Baruch Gouty RN, BSN Entered By: Baruch Gouty on 11/28/2021 10:16:37 -------------------------------------------------------------------------------- Vitals Details Patient Name: Date of Service: Erika Councilman A. 11/28/2021 9:00 A M Medical Record Number: [id number] Patient Account Number: [id number] Date of Birth/Sex: Treating RN: June 11, 1961 (61 y.o. Erika Harvey Primary Care Donzell Coller: [name] Other Clinician: Referring Tavi Hoogendoorn: Treating Kendric Sindelar/Extender: Mertha Finders in Treatment: 0 Vital Signs Time Taken: 09:52 Temperature (F): 98.6 Height (in): 63 Pulse (bpm): 82 Source: Stated Respiratory Rate (breaths/min): 18 Weight (lbs): 144 Blood Pressure (mmHg): 146/75 Source: Stated Reference Range: 80 - 120 mg / dl Body Mass Index (BMI): 25.5 Electronic Signature(s) Signed: 11/28/2021 3:12:02 PM By: Sandre Kitty Entered By: Sandre Kitty on 11/28/2021 09:53:44

## 2021-12-05 ENCOUNTER — Encounter (HOSPITAL_BASED_OUTPATIENT_CLINIC_OR_DEPARTMENT_OTHER): Payer: 59 | Admitting: General Surgery

## 2021-12-05 DIAGNOSIS — Z853 Personal history of malignant neoplasm of breast: Secondary | ICD-10-CM | POA: Diagnosis not present

## 2021-12-05 DIAGNOSIS — L97822 Non-pressure chronic ulcer of other part of left lower leg with fat layer exposed: Secondary | ICD-10-CM | POA: Diagnosis not present

## 2021-12-05 DIAGNOSIS — I10 Essential (primary) hypertension: Secondary | ICD-10-CM | POA: Diagnosis not present

## 2021-12-10 NOTE — Progress Notes (Signed)
Erika Harvey, Erika Harvey ([id number]) Visit Report for 12/05/2021 Arrival Information Details Patient Name: Date of Service: Erika Harvey, Erika Harvey 12/05/2021 8:15 A M Medical Record Number: [id number] Patient Account Number: [id number] Date of Birth/Sex: Treating RN: Feb 26, 1961 (61 y.o. Erika Harvey Primary Care Erika Harvey: Erika Harvey Other Clinician: Referring Erika Harvey: Treating Erika Harvey/Extender: Erika Harvey in Treatment: 1 Visit Information History Since Last Visit Added or deleted any medications: No Patient Arrived: Ambulatory Any new allergies or adverse reactions: No Arrival Time: 08:42 Had a fall or experienced change in No Accompanied By: Husband activities of daily living that may affect Transfer Assistance: None risk of falls: Patient Identification Verified: Yes Signs or symptoms of abuse/neglect since last visito No Secondary Verification Process Completed: Yes Hospitalized since last visit: No Implantable device outside of the clinic excluding No cellular tissue based products placed in the center since last visit: Has Dressing in Place as Prescribed: Yes Pain Present Now: Yes Electronic Signature(s) Signed: 12/06/2021 5:59:26 PM By: Erika Creamer RN, BSN Entered By: Erika Harvey on 12/05/2021 08:43:20 -------------------------------------------------------------------------------- Clinic Level of Care Assessment Details Patient Name: Date of Service: [name], [name] 12/05/2021 8:15 A M Medical Record Number: [id number] Patient Account Number: [id number] Date of Birth/Sex: Treating RN: 07/06/1961 (61 y.o. Erika Harvey Primary Care Erika Harvey: Erika Harvey Other Clinician: Referring Erika Harvey: Treating Erika Harvey Harvey in Treatment: 1 Clinic Level of Care Assessment Items TOOL 4 Quantity Score X- 1 0 Use when only an EandM is performed on FOLLOW-UP visit ASSESSMENTS - Nursing  Assessment / Reassessment X- 1 10 Reassessment of Co-morbidities (includes updates in patient status) X- 1 5 Reassessment of Adherence to Treatment Plan ASSESSMENTS - Wound and Skin A ssessment / Reassessment X - Simple Wound Assessment / Reassessment - one wound 1 5 '[]'$  - 0 Complex Wound Assessment / Reassessment - multiple wounds '[]'$  - 0 Dermatologic / Skin Assessment (not related to wound area) ASSESSMENTS - Focused Assessment X- 1 5 Circumferential Edema Measurements - multi extremities '[]'$  - 0 Nutritional Assessment / Counseling / Intervention '[]'$  - 0 Lower Extremity Assessment (monofilament, tuning fork, pulses) '[]'$  - 0 Peripheral Arterial Disease Assessment (using hand held doppler) ASSESSMENTS - Ostomy and/or Continence Assessment and Care '[]'$  - 0 Incontinence Assessment and Management '[]'$  - 0 Ostomy Care Assessment and Management (repouching, etc.) PROCESS - Coordination of Care X - Simple Patient / Family Education for ongoing care 1 15 '[]'$  - 0 Complex (extensive) Patient / Family Education for ongoing care X- 1 10 Staff obtains Programmer, systems, Records, T Results / Process Orders est X- 1 10 Staff telephones HHA, Nursing Homes / Clarify orders / etc '[]'$  - 0 Routine Transfer to another Facility (non-emergent condition) '[]'$  - 0 Routine Hospital Admission (non-emergent condition) '[]'$  - 0 New Admissions / Biomedical engineer / Ordering NPWT Apligraf, etc. , '[]'$  - 0 Emergency Hospital Admission (emergent condition) '[]'$  - 0 Simple Discharge Coordination '[]'$  - 0 Complex (extensive) Discharge Coordination PROCESS - Special Needs '[]'$  - 0 Pediatric / Minor Patient Management '[]'$  - 0 Isolation Patient Management '[]'$  - 0 Hearing / Language / Visual special needs '[]'$  - 0 Assessment of Community assistance (transportation, D/C planning, etc.) '[]'$  - 0 Additional assistance / Altered mentation '[]'$  - 0 Support Surface(s) Assessment (bed, cushion, seat, etc.) INTERVENTIONS - Wound  Cleansing / Measurement X - Simple Wound Cleansing - one wound 1 5 '[]'$  - 0 Complex Wound Cleansing - multiple wounds X- 1 5 Wound Imaging (photographs -  any number of wounds) '[]'$  - 0 Wound Tracing (instead of photographs) X- 1 5 Simple Wound Measurement - one wound '[]'$  - 0 Complex Wound Measurement - multiple wounds INTERVENTIONS - Wound Dressings X - Small Wound Dressing one or multiple wounds 1 10 '[]'$  - 0 Medium Wound Dressing one or multiple wounds '[]'$  - 0 Large Wound Dressing one or multiple wounds '[]'$  - 0 Application of Medications - topical '[]'$  - 0 Application of Medications - injection INTERVENTIONS - Miscellaneous '[]'$  - 0 External ear exam '[]'$  - 0 Specimen Collection (cultures, biopsies, blood, body fluids, etc.) '[]'$  - 0 Specimen(s) / Culture(s) sent or taken to Lab for analysis '[]'$  - 0 Patient Transfer (multiple staff / Harrel Lemon Lift / Similar devices) '[]'$  - 0 Simple Staple / Suture removal (25 or less) '[]'$  - 0 Complex Staple / Suture removal (26 or more) '[]'$  - 0 Hypo / Hyperglycemic Management (close monitor of Blood Glucose) '[]'$  - 0 Ankle / Brachial Index (ABI) - do not check if billed separately X- 1 5 Vital Signs Has the patient been seen at the hospital within the last three years: Yes Total Score: 90 Level Of Care: New/Established - Level 3 Electronic Signature(s) Signed: 12/06/2021 5:59:26 PM By: Erika Creamer RN, BSN Entered By: Erika Harvey on 12/05/2021 17:50:07 -------------------------------------------------------------------------------- Encounter Discharge Information Details Patient Name: Date of Service: Erika Councilman A. 12/05/2021 8:15 A M Medical Record Number: [id number] Patient Account Number: [id number] Date of Birth/Sex: Treating RN: 19-Jun-1961 (61 y.o. Erika Harvey Primary Care Erika Harvey: Erika Harvey Other Clinician: Referring Ceaser Ebeling: Treating Esti Demello/Extender: Erika Harvey in Treatment: 1 Encounter  Discharge Information Items Discharge Condition: Stable Ambulatory Status: Ambulatory Discharge Destination: Home Transportation: Private Auto Accompanied By: husband Schedule Follow-up Appointment: Yes Clinical Summary of Care: Patient Declined Electronic Signature(s) Signed: 12/06/2021 5:59:26 PM By: Erika Creamer RN, BSN Entered By: Erika Harvey on 12/05/2021 09:14:29 -------------------------------------------------------------------------------- Lower Extremity Assessment Details Patient Name: Date of Service: Erika Councilman A. 12/05/2021 8:15 A M Medical Record Number: [id number] Patient Account Number: [id number] Date of Birth/Sex: Treating RN: [birth year] (61 y.o. Erika Harvey Primary Care Dai Apel: Erika Harvey Other Clinician: Referring Ashtin Melichar: Treating Vernon Ariel/Extender: Erika Harvey in Treatment: 1 Edema Assessment Assessed: [Left: No] [Right: No] Edema: [Left: N] [Right: o] Calf Left: Right: Point of Measurement: From Medial Instep 33 cm Ankle Left: Right: Point of Measurement: From Medial Instep 21 cm Vascular Assessment Pulses: Dorsalis Pedis Palpable: [Left:Yes] Electronic Signature(s) Signed: 12/06/2021 5:59:26 PM By: Erika Creamer RN, BSN Entered By: Erika Harvey on 12/05/2021 08:48:34 -------------------------------------------------------------------------------- Multi Wound Chart Details Patient Name: Date of Service: Erika Councilman A. 12/05/2021 8:15 A M Medical Record Number: [id number] Patient Account Number: [id number] Date of Birth/Sex: Treating RN: 1960/08/23 (61 y.o. Elam Dutch Primary Care Yale Golla: Erika Harvey Other Clinician: Referring Proctor Carriker: Treating Katalyn Matin/Extender: Erika Harvey in Treatment: 1 Vital Signs Height(in): 63 Pulse(bpm): 85 Weight(lbs): 144 Blood Pressure(mmHg): 125/84 Body Mass Index(BMI): 25.5 Temperature(F): 98.0 Respiratory  Rate(breaths/min): 18 Photos: [N/A:N/A] Left, Lateral Lower Leg N/A N/A Wound Location: Trauma N/A N/A Wounding Event: Trauma, Other N/A N/A Primary Etiology: Hypertension, Received Radiation N/A N/A Comorbid History: 11/07/2021 N/A N/A Date Acquired: 1 N/A N/A Weeks of Treatment: Open N/A N/A Wound Status: No N/A N/A Wound Recurrence: 2.5x3.2x0.2 N/A N/A Measurements L x W x D (cm) 6.283 N/A N/A A (cm) : rea 1.257 N/A N/A Volume (cm) : 15.80% N/A N/A % Reduction in A rea: 15.80%  N/A N/A % Reduction in Volume: Full Thickness Without Exposed N/A N/A Classification: Support Structures Medium N/A N/A Exudate Amount: Serosanguineous N/A N/A Exudate Type: red, brown N/A N/A Exudate Color: Flat and Intact N/A N/A Wound Margin: Large (67-100%) N/A N/A Granulation Amount: Red N/A N/A Granulation Quality: Small (1-33%) N/A N/A Necrotic Amount: Fat Layer (Subcutaneous Tissue): Yes N/A N/A Exposed Structures: Fascia: No Tendon: No Muscle: No Joint: No Bone: No None N/A N/A Epithelialization: Treatment Notes Electronic Signature(s) Signed: 12/05/2021 9:08:56 AM By: Fredirick Maudlin MD FACS Signed: 12/10/2021 5:13:49 PM By: Baruch Gouty RN, BSN Entered By: Fredirick Maudlin on 12/05/2021 09:08:56 -------------------------------------------------------------------------------- Multi-Disciplinary Care Plan Details Patient Name: Date of Service: Erika Councilman A. 12/05/2021 8:15 A M Medical Record Number: [id number] Patient Account Number: [id number] Date of Birth/Sex: Treating RN: 02/05/1961 (62 y.o. Erika Harvey Primary Care Jermany Sundell: Erika Harvey Other Clinician: Referring Cheryll Keisler: Treating Hayat Warbington/Extender: Erika Harvey in Treatment: 1 Multidisciplinary Care Plan reviewed with physician Active Inactive Nutrition Nursing Diagnoses: Imbalanced nutrition Potential for alteratiion in Nutrition/Potential for imbalanced  nutrition Goals: Patient/caregiver agrees to and verbalizes understanding of need to use nutritional supplements and/or vitamins as prescribed Date Initiated: 11/28/2021 Target Resolution Date: 12/26/2021 Goal Status: Active Interventions: Assess patient nutrition upon admission and as needed per policy Treatment Activities: Patient referred to Primary Care Physician for further nutritional evaluation : 11/28/2021 Notes: Wound/Skin Impairment Nursing Diagnoses: Impaired tissue integrity Knowledge deficit related to ulceration/compromised skin integrity Goals: Patient/caregiver will verbalize understanding of skin care regimen Date Initiated: 11/28/2021 Target Resolution Date: 12/26/2021 Goal Status: Active Ulcer/skin breakdown will have a volume reduction of 30% by week 4 Date Initiated: 11/28/2021 Target Resolution Date: 12/26/2021 Goal Status: Active Interventions: Assess patient/caregiver ability to obtain necessary supplies Assess patient/caregiver ability to perform ulcer/skin care regimen upon admission and as needed Assess ulceration(s) every visit Provide education on ulcer and skin care Treatment Activities: Skin care regimen initiated : 11/28/2021 Topical wound management initiated : 11/28/2021 Notes: Electronic Signature(s) Signed: 12/06/2021 5:59:26 PM By: Erika Creamer RN, BSN Entered By: Erika Harvey on 12/05/2021 08:52:02 -------------------------------------------------------------------------------- Pain Assessment Details Patient Name: Date of Service: Erika Councilman A. 12/05/2021 8:15 A M Medical Record Number: [id number] Patient Account Number: [id number] Date of Birth/Sex: Treating RN: 19-Jun-1961 (61 y.o. Erika Harvey Primary Care Audreyanna Butkiewicz: Erika Harvey Other Clinician: Referring Shaneka Efaw: Treating Alyanna Stoermer/Extender: Erika Harvey in Treatment: 1 Active Problems Location of Pain Severity and Description of  Pain Patient Has Paino Yes Site Locations Duration of the Pain. Constant / Intermittento Intermittent Rate the pain. Current Pain Level: 6 Character of Pain Describe the Pain: Aching Pain Management and Medication Current Pain Management: Electronic Signature(s) Signed: 12/06/2021 5:59:26 PM By: Erika Creamer RN, BSN Entered By: Erika Harvey on 12/05/2021 08:46:15 -------------------------------------------------------------------------------- Patient/Caregiver Education Details Patient Name: Date of Service: Erika Harvey, Erika A. 5/31/2023andnbsp8:15 A M Medical Record Number: [id number] Patient Account Number: [id number] Date of Birth/Gender: Treating RN: Nov 09, 1960 (61 y.o. Erika Harvey Primary Care Physician: Erika Harvey Other Clinician: Referring Physician: Treating Physician/Extender: Erika Harvey in Treatment: 1 Education Assessment Education Provided To: Patient Education Topics Provided Wound/Skin Impairment: Methods: Explain/Verbal Responses: State content correctly Electronic Signature(s) Signed: 12/06/2021 5:59:26 PM By: Erika Creamer RN, BSN Entered By: Erika Harvey on 12/05/2021 08:52:18 -------------------------------------------------------------------------------- Wound Assessment Details Patient Name: Date of Service: Erika Councilman A. 12/05/2021 8:15 A M Medical Record Number: [id number] Patient Account Number: [id number] Date of Birth/Sex: Treating RN: Oct 13, 1960 (61 y.o. F)  Erika Harvey Primary Care Erique Kaser: Erika Harvey Other Clinician: Referring Adilene Areola: Treating Nimrat Woolworth/Extender: Erika Harvey in Treatment: 1 Wound Status Wound Number: 1 Primary Etiology: Trauma, Other Wound Location: Left, Lateral Lower Leg Wound Status: Open Wounding Event: Trauma Comorbid History: Hypertension, Received Radiation Date Acquired: 11/07/2021 Weeks Of Treatment: 1 Clustered Wound:  No Photos Wound Measurements Length: (cm) 2.5 Width: (cm) 3.2 Depth: (cm) 0.2 Area: (cm) 6.283 Volume: (cm) 1.257 % Reduction in Area: 15.8% % Reduction in Volume: 15.8% Epithelialization: None Tunneling: No Undermining: No Wound Description Classification: Full Thickness Without Exposed Support Structures Wound Margin: Flat and Intact Exudate Amount: Medium Exudate Type: Serosanguineous Exudate Color: red, brown Foul Odor After Cleansing: No Slough/Fibrino Yes Wound Bed Granulation Amount: Large (67-100%) Exposed Structure Granulation Quality: Red Fascia Exposed: No Necrotic Amount: Small (1-33%) Fat Layer (Subcutaneous Tissue) Exposed: Yes Necrotic Quality: Adherent Slough Tendon Exposed: No Muscle Exposed: No Joint Exposed: No Bone Exposed: No Treatment Notes Wound #1 (Lower Leg) Wound Laterality: Left, Lateral Cleanser Normal Saline Discharge Instruction: Cleanse the wound with Normal Saline prior to applying a clean dressing using gauze sponges, not tissue or cotton balls. Peri-Wound Care Topical Primary Dressing Hydrofera Blue Classic Foam, 2x2 in Discharge Instruction: Moisten with saline prior to applying to wound bed Secondary Dressing Bordered Gauze, 4x4 in Discharge Instruction: Apply over primary dressing as directed. Secured With Compression Wrap Compression Stockings Environmental education officer) Signed: 12/06/2021 5:59:26 PM By: Erika Creamer RN, BSN Entered By: Erika Harvey on 12/05/2021 08:50:24 -------------------------------------------------------------------------------- Olga Details Patient Name: Date of Service: Erika Councilman A. 12/05/2021 8:15 A M Medical Record Number: [id number] Patient Account Number: [id number] Date of Birth/Sex: Treating RN: 06-Apr-1961 (61 y.o. Erika Harvey Primary Care Adamarys Shall: Erika Harvey Other Clinician: Referring Marinda Tyer: Treating Lissete Maestas/Extender: Erika Harvey in Treatment: 1 Vital Signs Time Taken: 08:43 Temperature (F): 98.0 Height (in): 63 Pulse (bpm): 85 Weight (lbs): 144 Respiratory Rate (breaths/min): 18 Body Mass Index (BMI): 25.5 Blood Pressure (mmHg): 125/84 Reference Range: 80 - 120 mg / dl Electronic Signature(s) Signed: 12/06/2021 5:59:26 PM By: Erika Creamer RN, BSN Entered By: Erika Harvey on 12/05/2021 08:45:12

## 2021-12-10 NOTE — Progress Notes (Signed)
Erika Harvey, Erika Harvey ([id number]) Visit Report for 12/05/2021 Chief Complaint Document Details Patient Name: Date of Service: Erika Harvey, Erika Harvey 12/05/2021 8:15 A M Medical Record Number: [id number] Patient Account Number: [id number] Date of Birth/Sex: Treating RN: 1961-03-21 (61 y.o. Elam Dutch Primary Care Provider: [name] Other Clinician: Referring Provider: Treating Provider/Extender: Mertha Finders in Treatment: 1 Information Obtained from: Patient Chief Complaint Patient seen for complaints of Non-Healing Wound. Electronic Signature(s) Signed: 12/05/2021 9:09:01 AM By: Fredirick Maudlin MD FACS Entered By: Fredirick Maudlin on 12/05/2021 09:09:01 -------------------------------------------------------------------------------- HPI Details Patient Name: Date of Service: Erika Councilman A. 12/05/2021 8:15 A M Medical Record Number: [id number] Patient Account Number: [id number] Date of Birth/Sex: Treating RN: 08-Oct-1960 (61 y.o. Elam Dutch Primary Care Provider: [name] Other Clinician: Referring Provider: Treating Provider/Extender: Mertha Finders in Treatment: 1 History of Present Illness HPI Description: ADMISSION 11/28/2021 This is a 61 year old woman with a past medical history significant for hypertension and breast cancer. She suffered a laceration to her left lower extremity when her dog apparently clawed her as he was jumping up to chase a squirrel. She treated this at home with peroxide and topical antibiotic ointment but it did not get better. She presented to the emergency department on 18 Nov 2021, about a week after the injury occurred. The ED provider told her to stop using the hydrogen peroxide and prescribed a 1 week course of Keflex. She returned to the emergency department on May 22 with concern that the wound was more red, more swollen, and more painful. The emergency room gave her a  shot of Rocephin and initiated doxycycline. She was referred to wound care for further evaluation and management. On the anterior tibial surface of her right leg, there is a wound in the shape of an inverted V with a flap of nonviable skin loosely adherent to the surface. There is eschar and old hematoma accumulated on the wound. The periwound skin is intact. There is no erythema, induration, odor, or discharge. 12/05/2021: The wound is very clean today. No slough accumulation. No drainage. The periwound skin is in good condition. Electronic Signature(s) Signed: 12/05/2021 9:09:27 AM By: Fredirick Maudlin MD FACS Entered By: Fredirick Maudlin on 12/05/2021 09:09:27 -------------------------------------------------------------------------------- Physical Exam Details Patient Name: Date of Service: Erika Councilman A. 12/05/2021 8:15 A M Medical Record Number: [id number] Patient Account Number: [id number] Date of Birth/Sex: Treating RN: [birth year] (61 y.o. Elam Dutch Primary Care Provider: [name] Other Clinician: Referring Provider: Treating Provider/Extender: Mertha Finders in Treatment: 1 Constitutional . . . . No acute distress. Respiratory Normal work of breathing on room air. Notes 12/05/2021: The wound is very clean today. No slough accumulation. No drainage. The periwound skin is in good condition. Electronic Signature(s) Signed: 12/05/2021 9:09:56 AM By: Fredirick Maudlin MD FACS Entered By: Fredirick Maudlin on 12/05/2021 09:09:56 -------------------------------------------------------------------------------- Physician Orders Details Patient Name: Date of Service: Erika Councilman A. 12/05/2021 8:15 A M Medical Record Number: [id number] Patient Account Number: [id number] Date of Birth/Sex: Treating RN: 01-25-1961 (61 y.o. Donalda Ewings Primary Care Provider: [name] Other Clinician: Referring Provider: Treating  Provider/Extender: Mertha Finders in Treatment: 1 Verbal / Phone Orders: No Diagnosis Coding ICD-10 Coding Code Description I10 Essential (primary) hypertension L97.822 Non-pressure chronic ulcer of other part of left lower leg with fat layer exposed Follow-up Appointments ppointment in 1 week. - Dr. Celine Ahr RM 1 with [name] Return A Wed 6/7 @ 07:30  am Bathing/ Shower/ Hygiene May shower and wash wound with soap and water. - with dressing changes Wound Treatment Wound #1 - Lower Leg Wound Laterality: Left, Lateral Cleanser: Normal Saline (Generic) Every Other Day/30 Days Discharge Instructions: Cleanse the wound with Normal Saline prior to applying a clean dressing using gauze sponges, not tissue or cotton balls. Prim Dressing: Hydrofera Blue Classic Foam, 2x2 in (Generic) Every Other Day/30 Days ary Discharge Instructions: Moisten with saline prior to applying to wound bed Secondary Dressing: Bordered Gauze, 4x4 in (Generic) Every Other Day/30 Days Discharge Instructions: Apply over primary dressing as directed. Electronic Signature(s) Signed: 12/05/2021 9:10:09 AM By: Fredirick Maudlin MD FACS Entered By: Fredirick Maudlin on 12/05/2021 09:10:08 -------------------------------------------------------------------------------- Problem List Details Patient Name: Date of Service: Erika Councilman A. 12/05/2021 8:15 A M Medical Record Number: [id number] Patient Account Number: [id number] Date of Birth/Sex: Treating RN: 02/16/1961 (61 y.o. Elam Dutch Primary Care Provider: [name] Other Clinician: Referring Provider: Treating Provider/Extender: Mertha Finders in Treatment: 1 Active Problems ICD-10 Encounter Code Description Active Date MDM Diagnosis I10 Essential (primary) hypertension 11/28/2021 No Yes L97.822 Non-pressure chronic ulcer of other part of left lower leg with fat layer exposed5/24/2023 No Yes Inactive  Problems Resolved Problems Electronic Signature(s) Signed: 12/05/2021 9:08:49 AM By: Fredirick Maudlin MD FACS Entered By: Fredirick Maudlin on 12/05/2021 09:08:49 -------------------------------------------------------------------------------- Progress Note Details Patient Name: Date of Service: Erika Harvey, Erika Harvey 12/05/2021 8:15 A M Medical Record Number: [id number] Patient Account Number: [id number] Date of Birth/Sex: Treating RN: May 15, 1961 (61 y.o. Elam Dutch Primary Care Provider: [name] Other Clinician: Referring Provider: Treating Provider/Extender: Mertha Finders in Treatment: 1 Subjective Chief Complaint Information obtained from Patient Patient seen for complaints of Non-Healing Wound. History of Present Illness (HPI) ADMISSION 11/28/2021 This is a 62 year old woman with a past medical history significant for hypertension and breast cancer. She suffered a laceration to her left lower extremity when her dog apparently clawed her as he was jumping up to chase a squirrel. She treated this at home with peroxide and topical antibiotic ointment but it did not get better. She presented to the emergency department on 18 Nov 2021, about a week after the injury occurred. The ED provider told her to stop using the hydrogen peroxide and prescribed a 1 week course of Keflex. She returned to the emergency department on May 22 with concern that the wound was more red, more swollen, and more painful. The emergency room gave her a shot of Rocephin and initiated doxycycline. She was referred to wound care for further evaluation and management. On the anterior tibial surface of her right leg, there is a wound in the shape of an inverted V with a flap of nonviable skin loosely adherent to the surface. There is eschar and old hematoma accumulated on the wound. The periwound skin is intact. There is no erythema, induration, odor, or discharge. 12/05/2021: The  wound is very clean today. No slough accumulation. No drainage. The periwound skin is in good condition. Patient History Information obtained from Patient, Chart. Family History Cancer - Mother, Diabetes - Father, Heart Disease - Father, Hypertension - Father, No family history of Hereditary Spherocytosis, Kidney Disease, Lung Disease, Seizures, Stroke, Thyroid Problems, Tuberculosis. Social History Never smoker, Marital Status - Married, Alcohol Use - Rarely, Drug Use - No History, Caffeine Use - Moderate - soda. Medical History Eyes Denies history of Cataracts, Glaucoma, Optic Neuritis Ear/Nose/Mouth/Throat Denies history of Chronic sinus problems/congestion, Middle ear problems  Cardiovascular Patient has history of Hypertension Endocrine Denies history of Type I Diabetes, Type II Diabetes Genitourinary Denies history of End Stage Renal Disease Integumentary (Skin) Denies history of History of Burn Musculoskeletal Denies history of Gout, Rheumatoid Arthritis, Osteoarthritis, Osteomyelitis Oncologic Patient has history of Received Radiation Denies history of Received Chemotherapy Psychiatric Denies history of Anorexia/bulimia, Confinement Anxiety Hospitalization/Surgery History - breast lumpectomy. - lumbar surgery. - left knee surgery. - wisdom tooth extraction. Medical A Surgical History Notes nd Gastrointestinal GERD Musculoskeletal lumbar herniated disc, lumbar DDD Oncologic hx breast CA Objective Constitutional No acute distress. Vitals Time Taken: 8:43 AM, Height: 63 in, Weight: 144 lbs, BMI: 25.5, Temperature: 98.0 F, Pulse: 85 bpm, Respiratory Rate: 18 breaths/min, Blood Pressure: 125/84 mmHg. Respiratory Normal work of breathing on room air. General Notes: 12/05/2021: The wound is very clean today. No slough accumulation. No drainage. The periwound skin is in good condition. Integumentary (Hair, Skin) Wound #1 status is Open. Original cause of wound was  Trauma. The date acquired was: 11/07/2021. The wound has been in treatment 1 weeks. The wound is located on the Left,Lateral Lower Leg. The wound measures 2.5cm length x 3.2cm width x 0.2cm depth; 6.283cm^2 area and 1.257cm^3 volume. There is Fat Layer (Subcutaneous Tissue) exposed. There is no tunneling or undermining noted. There is a medium amount of serosanguineous drainage noted. The wound margin is flat and intact. There is large (67-100%) red granulation within the wound bed. There is a small (1-33%) amount of necrotic tissue within the wound bed including Adherent Slough. Assessment Active Problems ICD-10 Essential (primary) hypertension Non-pressure chronic ulcer of other part of left lower leg with fat layer exposed Plan Follow-up Appointments: Return Appointment in 1 week. - Dr. Celine Ahr RM 1 with Delice Lesch 6/7 @ 07:30 am Bathing/ Shower/ Hygiene: May shower and wash wound with soap and water. - with dressing changes WOUND #1: - Lower Leg Wound Laterality: Left, Lateral Cleanser: Normal Saline (Generic) Every Other Day/30 Days Discharge Instructions: Cleanse the wound with Normal Saline prior to applying a clean dressing using gauze sponges, not tissue or cotton balls. Prim Dressing: Hydrofera Blue Classic Foam, 2x2 in (Generic) Every Other Day/30 Days ary Discharge Instructions: Moisten with saline prior to applying to wound bed Secondary Dressing: Bordered Gauze, 4x4 in (Generic) Every Other Day/30 Days Discharge Instructions: Apply over primary dressing as directed. 12/05/2021: The wound is very clean today. No slough accumulation. No drainage. The periwound skin is in good condition. No debridement was necessary today. We will continue using Hydrofera Blue. Follow-up in 1 week. Electronic Signature(s) Signed: 12/05/2021 9:10:27 AM By: Fredirick Maudlin MD FACS Entered By: Fredirick Maudlin on 12/05/2021  09:10:26 -------------------------------------------------------------------------------- HxROS Details Patient Name: Date of Service: Erika Councilman A. 12/05/2021 8:15 A M Medical Record Number: [id number] Patient Account Number: [id number] Date of Birth/Sex: Treating RN: Aug 02, 1960 (61 y.o. Elam Dutch Primary Care Provider: [name] Other Clinician: Referring Provider: Treating Provider/Extender: Mertha Finders in Treatment: 1 Information Obtained From Patient Chart Eyes Medical History: Negative for: Cataracts; Glaucoma; Optic Neuritis Ear/Nose/Mouth/Throat Medical History: Negative for: Chronic sinus problems/congestion; Middle ear problems Cardiovascular Medical History: Positive for: Hypertension Gastrointestinal Medical History: Past Medical History Notes: GERD Endocrine Medical History: Negative for: Type I Diabetes; Type II Diabetes Genitourinary Medical History: Negative for: End Stage Renal Disease Integumentary (Skin) Medical History: Negative for: History of Burn Musculoskeletal Medical History: Negative for: Gout; Rheumatoid Arthritis; Osteoarthritis; Osteomyelitis Past Medical History Notes: lumbar herniated disc, lumbar DDD Oncologic Medical  History: Positive for: Received Radiation Negative for: Received Chemotherapy Past Medical History Notes: hx breast CA Psychiatric Medical History: Negative for: Anorexia/bulimia; Confinement Anxiety Immunizations Pneumococcal Vaccine: Received Pneumococcal Vaccination: No Implantable Devices No devices added Hospitalization / Surgery History Type of Hospitalization/Surgery breast lumpectomy lumbar surgery left knee surgery wisdom tooth extraction Family and Social History Cancer: Yes - Mother; Diabetes: Yes - Father; Heart Disease: Yes - Father; Hereditary Spherocytosis: No; Hypertension: Yes - Father; Kidney Disease: No; Lung Disease: No; Seizures: No;  Stroke: No; Thyroid Problems: No; Tuberculosis: No; Never smoker; Marital Status - Married; Alcohol Use: Rarely; Drug Use: No History; Caffeine Use: Moderate - soda; Financial Concerns: No; Food, Clothing or Shelter Needs: No; Support System Lacking: No; Transportation Concerns: No Engineer, maintenance) Signed: 12/05/2021 12:04:28 PM By: Fredirick Maudlin MD FACS Signed: 12/10/2021 5:13:49 PM By: Baruch Gouty RN, BSN Entered By: Fredirick Maudlin on 12/05/2021 09:09:33 -------------------------------------------------------------------------------- [place] Details Patient Name: Date of Service: Erika Councilman A. 12/05/2021 Medical Record Number: [id number] Patient Account Number: [id number] Date of Birth/Sex: Treating RN: 10/23/1960 (61 y.o. Elam Dutch Primary Care Provider: [name] Other Clinician: Referring Provider: Treating Provider/Extender: Mertha Finders in Treatment: 1 Diagnosis Coding ICD-10 Codes Code Description I10 Essential (primary) hypertension [phone] Non-pressure chronic ulcer of other part of left lower leg with fat layer exposed Facility Procedures CPT4 Code: [id number] Description: [facility] VISIT-LEV 3 EST PT Modifier: 25 Quantity: 1 Physician Procedures : CPT4 Code Description Modifier [id number] - WC PHYS LEVEL 3 - EST PT ICD-10 Diagnosis Description L97.822 Non-pressure chronic ulcer of other part of left lower leg with fat layer exposed I10 Essential (primary) hypertension Quantity: 1 Electronic Signature(s) Signed: 12/06/2021 8:58:31 AM By: Fredirick Maudlin MD FACS Signed: 12/06/2021 5:59:26 PM By: Sharyn Creamer RN, BSN Previous Signature: 12/05/2021 9:10:40 AM Version By: Fredirick Maudlin MD FACS Entered By: Sharyn Creamer on 12/05/2021 17:50:20

## 2021-12-11 ENCOUNTER — Other Ambulatory Visit: Payer: Self-pay | Admitting: Family Medicine

## 2021-12-11 DIAGNOSIS — F419 Anxiety disorder, unspecified: Secondary | ICD-10-CM

## 2021-12-11 NOTE — Telephone Encounter (Signed)
Requested medication (s) are due for refill today:   Provider to review  Requested medication (s) are on the active medication list:   Yes  Future visit scheduled:   No   Last ordered: 11/09/2021 #60, 0 refill  Returned because this is a non delegated refill.     (There were 2 requests for this medication so this is duplicate.   Didn't want to take a chance on refusing this one and cancelling the request for the other one).   Requested Prescriptions  Pending Prescriptions Disp Refills   diazepam (VALIUM) 5 MG tablet [Pharmacy Med Name: diazePAM 5 MG TABLET] 60 tablet     Sig: TAKE ONE TABLET BY MOUTH TWICE A DAY AS NEEDED FOR ANXIETY     Not Delegated - Psychiatry: Anxiolytics/Hypnotics 2 Failed - 12/11/2021 10:52 AM      Failed - This refill cannot be delegated      Failed - Urine Drug Screen completed in last 360 days      Failed - Valid encounter within last 6 months    Recent Outpatient Visits           8 months ago Chronic pain of left knee   [place] [name], MD   1 year ago Bilateral chronic knee pain   [place] [facility] Scha[facility], Cammie Mcgee, MD   1 year ago Bilateral chronic knee pain   [place] [name], MD   2 years ago Acute renal insufficiency   [place] [name], MD   2 years ago Benign essential HTN   [place], Cammie Mcgee, MD               Passed - Patient is not pregnant

## 2021-12-11 NOTE — Telephone Encounter (Signed)
Requested medication (s) are due for refill today:   Provider to review  Requested medication (s) are on the active medication list:   Yes  Future visit scheduled:   No   Last ordered: 11/09/2021 #60, 0 refills  Returned because it's a non delegated refill per protocol   Requested Prescriptions  Pending Prescriptions Disp Refills   diazepam (VALIUM) 5 MG tablet [Pharmacy Med Name: diazePAM 5 MG TABLET] 60 tablet     Sig: TAKE ONE TABLET BY MOUTH TWICE A DAY AS [place]     Not Delegated - Psychiatry: Anxiolytics/Hypnotics 2 Failed - 12/11/2021 10:52 AM      Failed - This refill cannot be delegated      Failed - Urine Drug Screen completed in last 360 days      Failed - Valid encounter within last 6 months    Recent Outpatient Visits           8 months ago Chronic pain of left knee   [place] [name], MD   1 year ago Bilateral chronic knee pain   [place] [facility] Schaumann, Cammie Mcgee, MD   1 year ago Bilateral chronic knee pain   [place] [facility] Scha[facility], Cammie Mcgee, MD   2 years ago Acute renal insufficiency   [place] [name], MD   2 years ago Benign essential HTN   [place], Cammie Mcgee, MD               Passed - Patient is not pregnant

## 2021-12-11 NOTE — Telephone Encounter (Signed)
Pharmacy faxed a refill request for  traMADol (ULTRAM) 50 MG tablet [[id number]]  DISCONTINUED   Order Details Dose: 50 mg Route: Oral Frequency: Every 6 hours PRN  Dispense Quantity: 120 tablet Refills: 0        Sig: Take 1 tablet (50 mg total) by mouth every 6 (six) hours as needed.       Start Date: 10/15/21 End Date: 11/08/21  Discontinued by: [name], MD on 11/09/2021 16:11

## 2021-12-12 ENCOUNTER — Encounter (HOSPITAL_BASED_OUTPATIENT_CLINIC_OR_DEPARTMENT_OTHER): Payer: 59 | Attending: General Surgery | Admitting: General Surgery

## 2021-12-12 ENCOUNTER — Other Ambulatory Visit: Payer: Self-pay | Admitting: Family Medicine

## 2021-12-12 DIAGNOSIS — L97822 Non-pressure chronic ulcer of other part of left lower leg with fat layer exposed: Secondary | ICD-10-CM | POA: Diagnosis not present

## 2021-12-12 DIAGNOSIS — Z853 Personal history of malignant neoplasm of breast: Secondary | ICD-10-CM | POA: Insufficient documentation

## 2021-12-12 DIAGNOSIS — Z923 Personal history of irradiation: Secondary | ICD-10-CM | POA: Diagnosis not present

## 2021-12-12 DIAGNOSIS — K219 Gastro-esophageal reflux disease without esophagitis: Secondary | ICD-10-CM | POA: Insufficient documentation

## 2021-12-12 DIAGNOSIS — I10 Essential (primary) hypertension: Secondary | ICD-10-CM | POA: Diagnosis not present

## 2021-12-12 DIAGNOSIS — F419 Anxiety disorder, unspecified: Secondary | ICD-10-CM

## 2021-12-12 NOTE — Progress Notes (Signed)
Erika Harvey, Erika Harvey ([id number]) Visit Report for 12/12/2021 Chief Complaint Document Details Patient Name: Date of Service: Erika Harvey, Erika Harvey 12/12/2021 7:30 A M Medical Record Number: [id number] Patient Account Number: [id number] Date of Birth/Sex: Treating RN: Aug 06, 1960 (61 y.o. Erika Harvey Primary Care Provider: Jenna Harvey Other Clinician: Referring Provider: Treating Provider/Extender: Erika Harvey in Treatment: 2 Information Obtained from: Patient Chief Complaint Patient seen for complaints of Non-Healing Wound. Electronic Signature(s) Signed: 12/12/2021 8:00:42 AM By: Erika Harvey Entered By: Erika Harvey on 12/12/2021 08:00:42 -------------------------------------------------------------------------------- Debridement Details Patient Name: Date of Service: Erika Councilman A. 12/12/2021 7:30 A M Medical Record Number: [id number] Patient Account Number: [id number] Date of Birth/Sex: Treating RN: 1960-10-11 (61 y.o. Erika Harvey, Erika Harvey Primary Care Provider: Jenna Harvey Other Clinician: Referring Provider: Treating Provider/Extender: Erika Harvey in Treatment: 2 Debridement Performed for Assessment: Wound #1 Left,Lateral Lower Leg Performed By: Physician Erika Maudlin, MD Debridement Type: Debridement Level of Consciousness (Pre-procedure): Awake and Alert Pre-procedure Verification/Time Out Yes - 07:55 Taken: Start Time: 07:58 Pain Control: Lidocaine 4% T opical Solution T Area Debrided (L x W): otal 1.8 (cm) x 3 (cm) = 5.4 (cm) Tissue and other material debrided: Viable, Non-Viable, Slough, Subcutaneous, Slough Level: Skin/Subcutaneous Tissue Debridement Description: Excisional Instrument: Curette Bleeding: Minimum Hemostasis Achieved: Pressure Procedural Pain: 2 Post Procedural Pain: 0 Response to Treatment: Procedure was tolerated well Level of Consciousness (Post- Awake and  Alert procedure): Post Debridement Measurements of Total Wound Length: (cm) 1.8 Width: (cm) 3 Depth: (cm) 0.1 Volume: (cm) 0.424 Character of Wound/Ulcer Post Debridement: Improved Post Procedure Diagnosis Same as Pre-procedure Electronic Signature(s) Signed: 12/12/2021 2:37:03 PM By: Erika Harvey Signed: 12/12/2021 5:06:14 PM By: Erika Gouty RN, BSN Entered By: Erika Harvey on 12/12/2021 07:59:53 -------------------------------------------------------------------------------- HPI Details Patient Name: Date of Service: Erika Councilman A. 12/12/2021 7:30 A M Medical Record Number: [id number] Patient Account Number: [id number] Date of Birth/Sex: Treating RN: [birth year] (61 y.o. Erika Harvey Primary Care Provider: Jenna Harvey Other Clinician: Referring Provider: Treating Provider/Extender: Erika Harvey in Treatment: 2 History of Present Illness HPI Description: ADMISSION 11/28/2021 This is a 61 year old woman with a past medical history significant for hypertension and breast cancer. She suffered a laceration to her left lower extremity when her dog apparently clawed her as he was jumping up to chase a squirrel. She treated this at home with peroxide and topical antibiotic ointment but it did not get better. She presented to the emergency department on 18 Nov 2021, about a week after the injury occurred. The ED provider told her to stop using the hydrogen peroxide and prescribed a 1 week course of Keflex. She returned to the emergency department on May 22 with concern that the wound was more red, more swollen, and more painful. The emergency room gave her a shot of Rocephin and initiated doxycycline. She was referred to wound care for further evaluation and management. On the anterior tibial surface of her right leg, there is a wound in the shape of an inverted V with a flap of nonviable skin loosely adherent to the surface. There is  eschar and old hematoma accumulated on the wound. The periwound skin is intact. There is no erythema, induration, odor, or discharge. 12/05/2021: The wound is very clean today. No slough accumulation. No drainage. The periwound skin is in good condition. 12/12/2021: The wound is smaller today. There is granulation tissue emerging. Just a small amount of slough. No  concern for infection. Electronic Signature(s) Signed: 12/12/2021 8:01:13 AM By: Erika Harvey Entered By: Erika Harvey on 12/12/2021 08:01:13 -------------------------------------------------------------------------------- Physical Exam Details Patient Name: Date of Service: Erika Councilman A. 12/12/2021 7:30 A M Medical Record Number: [id number] Patient Account Number: [id number] Date of Birth/Sex: Treating RN: 1960-11-06 (61 y.o. Erika Harvey Primary Care Provider: Jenna Harvey Other Clinician: Referring Provider: Treating Provider/Extender: Erika Harvey in Treatment: 2 Constitutional Hypertensive, asymptomatic. . . . No acute distress.Marland Kitchen Respiratory Normal work of breathing on room air.. Notes 12/12/2021: The wound is smaller today. There is granulation tissue emerging. Just a small amount of slough. No concern for infection. Electronic Signature(s) Signed: 12/12/2021 8:09:36 AM By: Erika Harvey Signed: 12/12/2021 8:09:36 AM By: Erika Harvey Entered By: Erika Harvey on 12/12/2021 08:09:36 -------------------------------------------------------------------------------- Physician Orders Details Patient Name: Date of Service: Erika Councilman A. 12/12/2021 7:30 A M Medical Record Number: [id number] Patient Account Number: [id number] Date of Birth/Sex: Treating RN: 12-Aug-1960 (61 y.o. Erika Harvey Primary Care Provider: Jenna Harvey Other Clinician: Referring Provider: Treating Provider/Extender: Erika Harvey in  Treatment: 2 Verbal / Phone Orders: No Diagnosis Coding ICD-10 Coding Code Description I10 Essential (primary) hypertension L97.822 Non-pressure chronic ulcer of other part of left lower leg with fat layer exposed Follow-up Appointments ppointment in 1 week. - Dr. Celine Harvey RM 1 with Erika Harvey Return A Wed 6/14 @ 0815 am Bathing/ Shower/ Hygiene May shower and wash wound with soap and water. - with dressing changes Wound Treatment Wound #1 - Lower Leg Wound Laterality: Left, Lateral Cleanser: Normal Saline (Generic) Every Other Day/30 Days Discharge Instructions: Cleanse the wound with Normal Saline prior to applying a clean dressing using gauze sponges, not tissue or cotton balls. Prim Dressing: Hydrofera Blue Classic Foam, 2x2 in (Generic) Every Other Day/30 Days ary Discharge Instructions: Moisten with saline prior to applying to wound bed Secondary Dressing: Bordered Gauze, 4x4 in (Generic) Every Other Day/30 Days Discharge Instructions: Apply over primary dressing as directed. Patient Medications llergies: amlodipine A Notifications Medication Indication Start End prior to debridement 12/12/2021 lidocaine DOSE topical 4 % cream - cream topical Electronic Signature(s) Signed: 12/12/2021 8:09:48 AM By: Erika Harvey Entered By: Erika Harvey on 12/12/2021 08:09:47 -------------------------------------------------------------------------------- Problem List Details Patient Name: Date of Service: Erika Councilman A. 12/12/2021 7:30 A M Medical Record Number: [id number] Patient Account Number: [id number] Date of Birth/Sex: Treating RN: [birth year] (61 y.o. Erika Harvey Primary Care Provider: Jenna Harvey Other Clinician: Referring Provider: Treating Provider/Extender: Erika Harvey in Treatment: 2 Active Problems ICD-10 Encounter Code Description Active Date MDM Diagnosis I10 Essential (primary) hypertension 11/28/2021 No  Yes L97.822 Non-pressure chronic ulcer of other part of left lower leg with fat layer exposed5/24/2023 No Yes Inactive Problems Resolved Problems Electronic Signature(s) Signed: 12/12/2021 8:00:31 AM By: Erika Harvey Entered By: Erika Harvey on 12/12/2021 08:00:31 -------------------------------------------------------------------------------- Progress Note Details Patient Name: Date of Service: Erika Harvey, Erika Harvey 12/12/2021 7:30 A M Medical Record Number: [id number] Patient Account Number: [id number] Date of Birth/Sex: Treating RN: [birth year] (61 y.o. Erika Harvey Primary Care Provider: Jenna Harvey Other Clinician: Referring Provider: Treating Provider/Extender: Erika Harvey in Treatment: 2 Subjective Chief Complaint Information obtained from Patient Patient seen for complaints of Non-Healing Wound. History of Present Illness (HPI) ADMISSION 11/28/2021 This is a 61 year old woman with a past medical history significant for hypertension and breast cancer. She suffered a laceration to her  left lower extremity when her dog apparently clawed her as he was jumping up to chase a squirrel. She treated this at home with peroxide and topical antibiotic ointment but it did not get better. She presented to the emergency department on 18 Nov 2021, about a week after the injury occurred. The ED provider told her to stop using the hydrogen peroxide and prescribed a 1 week course of Keflex. She returned to the emergency department on May 22 with concern that the wound was more red, more swollen, and more painful. The emergency room gave her a shot of Rocephin and initiated doxycycline. She was referred to wound care for further evaluation and management. On the anterior tibial surface of her right leg, there is a wound in the shape of an inverted V with a flap of nonviable skin loosely adherent to the surface. There is eschar and old hematoma  accumulated on the wound. The periwound skin is intact. There is no erythema, induration, odor, or discharge. 12/05/2021: The wound is very clean today. No slough accumulation. No drainage. The periwound skin is in good condition. 12/12/2021: The wound is smaller today. There is granulation tissue emerging. Just a small amount of slough. No concern for infection. Patient History Information obtained from Patient, Chart. Family History Cancer - Mother, Diabetes - Father, Heart Disease - Father, Hypertension - Father, No family history of Hereditary Spherocytosis, Kidney Disease, Lung Disease, Seizures, Stroke, Thyroid Problems, Tuberculosis. Social History Never smoker, Marital Status - Married, Alcohol Use - Rarely, Drug Use - No History, Caffeine Use - Moderate - soda. Medical History Eyes Denies history of Cataracts, Glaucoma, Optic Neuritis Ear/Nose/Mouth/Throat Denies history of Chronic sinus problems/congestion, Middle ear problems Cardiovascular Patient has history of Hypertension Endocrine Denies history of Type I Diabetes, Type II Diabetes Genitourinary Denies history of End Stage Renal Disease Integumentary (Skin) Denies history of History of Burn Musculoskeletal Denies history of Gout, Rheumatoid Arthritis, Osteoarthritis, Osteomyelitis Oncologic Patient has history of Received Radiation Denies history of Received Chemotherapy Psychiatric Denies history of Anorexia/bulimia, Confinement Anxiety Hospitalization/Surgery History - breast lumpectomy. - lumbar surgery. - left knee surgery. - wisdom tooth extraction. Medical A Surgical History Notes nd Gastrointestinal GERD Musculoskeletal lumbar herniated disc, lumbar DDD Oncologic hx breast CA Objective Constitutional Hypertensive, asymptomatic. No acute distress.. Vitals Time Taken: 7:45 AM, Height: 63 in, Weight: 144 lbs, BMI: 25.5, Temperature: 98.3 F, Pulse: 80 bpm, Respiratory Rate: 18 breaths/min, Blood  Pressure: 176/97 mmHg. Respiratory Normal work of breathing on room air.. General Notes: 12/12/2021: The wound is smaller today. There is granulation tissue emerging. Just a small amount of slough. No concern for infection. Integumentary (Hair, Skin) Wound #1 status is Open. Original cause of wound was Trauma. The date acquired was: 11/07/2021. The wound has been in treatment 2 weeks. The wound is located on the Left,Lateral Lower Leg. The wound measures 1.8cm length x 3cm width x 0.1cm depth; 4.241cm^2 area and 0.424cm^3 volume. There is Fat Layer (Subcutaneous Tissue) exposed. There is no tunneling or undermining noted. There is a medium amount of serosanguineous drainage noted. The wound margin is flat and intact. There is medium (34-66%) red granulation within the wound bed. There is a medium (34-66%) amount of necrotic tissue within the wound bed including Adherent Slough. Assessment Active Problems ICD-10 Essential (primary) hypertension Non-pressure chronic ulcer of other part of left lower leg with fat layer exposed Procedures Wound #1 Pre-procedure diagnosis of Wound #1 is a Trauma, Other located on the Left,Lateral Lower Leg . There  was a Excisional Skin/Subcutaneous Tissue Debridement with a total area of 5.4 sq cm performed by Erika Maudlin, MD. With the following instrument(s): Curette to remove Viable and Non-Viable tissue/material. Material removed includes Subcutaneous Tissue and Slough and after achieving pain control using Lidocaine 4% T opical Solution. No specimens were taken. A time out was conducted at 07:55, prior to the start of the procedure. A Minimum amount of bleeding was controlled with Pressure. The procedure was tolerated well with a pain level of 2 throughout and a pain level of 0 following the procedure. Post Debridement Measurements: 1.8cm length x 3cm width x 0.1cm depth; 0.424cm^3 volume. Character of Wound/Ulcer Post Debridement is improved. Post procedure  Diagnosis Wound #1: Same as Pre-Procedure Plan Follow-up Appointments: Return Appointment in 1 week. - Dr. Celine Harvey RM 1 with Delice Lesch 6/14 @ 0815 am Bathing/ Shower/ Hygiene: May shower and wash wound with soap and water. - with dressing changes The following medication(s) was prescribed: lidocaine topical 4 % cream cream topical for prior to debridement was prescribed at facility WOUND #1: - Lower Leg Wound Laterality: Left, Lateral Cleanser: Normal Saline (Generic) Every Other Day/30 Days Discharge Instructions: Cleanse the wound with Normal Saline prior to applying a clean dressing using gauze sponges, not tissue or cotton balls. Prim Dressing: Hydrofera Blue Classic Foam, 2x2 in (Generic) Every Other Day/30 Days ary Discharge Instructions: Moisten with saline prior to applying to wound bed Secondary Dressing: Bordered Gauze, 4x4 in (Generic) Every Other Day/30 Days Discharge Instructions: Apply over primary dressing as directed. 12/12/2021: The wound is smaller today. There is granulation tissue emerging. Just a small amount of slough. No concern for infection. I used a curette to debride the slough from the wound. We will continue using Hydrofera Blue. Follow-up in 1 week. Electronic Signature(s) Signed: 12/12/2021 8:10:06 AM By: Erika Harvey Entered By: Erika Harvey on 12/12/2021 08:10:06 -------------------------------------------------------------------------------- HxROS Details Patient Name: Date of Service: Erika Councilman A. 12/12/2021 7:30 A M Medical Record Number: [id number] Patient Account Number: [id number] Date of Birth/Sex: Treating RN: 09/09/1960 (61 y.o. Erika Harvey Primary Care Provider: Jenna Harvey Other Clinician: Referring Provider: Treating Provider/Extender: Erika Harvey in Treatment: 2 Information Obtained From Patient Chart Eyes Medical History: Negative for: Cataracts; Glaucoma; Optic  Neuritis Ear/Nose/Mouth/Throat Medical History: Negative for: Chronic sinus problems/congestion; Middle ear problems Cardiovascular Medical History: Positive for: Hypertension Gastrointestinal Medical History: Past Medical History Notes: GERD Endocrine Medical History: Negative for: Type I Diabetes; Type II Diabetes Genitourinary Medical History: Negative for: End Stage Renal Disease Integumentary (Skin) Medical History: Negative for: History of Burn Musculoskeletal Medical History: Negative for: Gout; Rheumatoid Arthritis; Osteoarthritis; Osteomyelitis Past Medical History Notes: lumbar herniated disc, lumbar DDD Oncologic Medical History: Positive for: Received Radiation Negative for: Received Chemotherapy Past Medical History Notes: hx breast CA Psychiatric Medical History: Negative for: Anorexia/bulimia; Confinement Anxiety Immunizations Pneumococcal Vaccine: Received Pneumococcal Vaccination: No Implantable Devices No devices added Hospitalization / Surgery History Type of Hospitalization/Surgery breast lumpectomy lumbar surgery left knee surgery wisdom tooth extraction Family and Social History Cancer: Yes - Mother; Diabetes: Yes - Father; Heart Disease: Yes - Father; Hereditary Spherocytosis: No; Hypertension: Yes - Father; Kidney Disease: No; Lung Disease: No; Seizures: No; Stroke: No; Thyroid Problems: No; Tuberculosis: No; Never smoker; Marital Status - Married; Alcohol Use: Rarely; Drug Use: No History; Caffeine Use: Moderate - soda; Financial Concerns: No; Food, Clothing or Shelter Needs: No; Support System Lacking: No; Transportation Concerns: No Electronic Signature(s) Signed: 12/12/2021 2:37:03 PM  By: Erika Harvey Signed: 12/12/2021 5:06:14 PM By: Erika Gouty RN, BSN Entered By: Erika Harvey on 12/12/2021 08:01:20 -------------------------------------------------------------------------------- SuperBill Details Patient Name: Date  of Service: Erika Councilman A. 12/12/2021 Medical Record Number: [id number] Patient Account Number: [id number] Date of Birth/Sex: Treating RN: [birth year] (60 y.o. Erika Harvey Primary Care Provider: Jenna Harvey Other Clinician: Referring Provider: Treating Provider/Extender: Erika Harvey in Treatment: 2 Diagnosis Coding ICD-10 Codes Code Description I10 Essential (primary) hypertension [phone] Non-pressure chronic ulcer of other part of left lower leg with fat layer exposed Facility Procedures CPT4 Code: [id number] Description: [place] TISSUE 20 SQ CM/< ICD-10 Diagnosis Description L97.822 Non-pressure chronic ulcer of other part of left lower leg with fat layer expo Modifier: sed Quantity: 1 Physician Procedures : CPT4 Code Description Modifier [id number] - WC PHYS LEVEL 3 - EST PT 25 ICD-10 Diagnosis Description L97.822 Non-pressure chronic ulcer of other part of left lower leg with fat layer exposed I10 Essential (primary) hypertension Quantity: 1 : [id number] - WC PHYS SUBQ TISS 20 SQ CM ICD-10 Diagnosis Description L97.822 Non-pressure chronic ulcer of other part of left lower leg with fat layer exposed Quantity: 1 Electronic Signature(s) Signed: 12/12/2021 8:11:01 AM By: Erika Harvey Entered By: Erika Harvey on 12/12/2021 08:11:01

## 2021-12-12 NOTE — Progress Notes (Signed)
DEVON, KINGDON ([id number]) Visit Report for 12/12/2021 Arrival Information Details Patient Name: Date of Service: AVARY, EICHENBERGER 12/12/2021 7:30 A M Medical Record Number: [id number] Patient Account Number: [id number] Date of Birth/Sex: Treating RN: 1961-03-31 (61 y.o. Martyn Malay, Linda Primary Care Obdulio Mash: [name] Other Clinician: Referring Adiva Boettner: Treating Lizandro Spellman/Extender: Mertha Finders in Treatment: 2 Visit Information History Since Last Visit Added or deleted any medications: No Patient Arrived: Ambulatory Any new allergies or adverse reactions: No Arrival Time: 07:43 Had a fall or experienced change in No Accompanied By: spouse activities of daily living that may affect Transfer Assistance: None risk of falls: Patient Identification Verified: Yes Signs or symptoms of abuse/neglect since last visito No Secondary Verification Process Completed: Yes Hospitalized since last visit: No Patient Requires Transmission-Based Precautions: No Implantable device outside of the clinic excluding No Patient Has Alerts: No cellular tissue based products placed in the center since last visit: Has Dressing in Place as Prescribed: Yes Pain Present Now: No Electronic Signature(s) Signed: 12/12/2021 5:06:14 PM By: Baruch Gouty RN, BSN Entered By: Baruch Gouty on 12/12/2021 07:43:44 -------------------------------------------------------------------------------- Encounter Discharge Information Details Patient Name: Date of Service: [name] 12/12/2021 7:30 A M Medical Record Number: [id number] Patient Account Number: [id number] Date of Birth/Sex: Treating RN: September 13, 1960 (61 y.o. Elam Dutch Primary Care Tico Crotteau: [name] Other Clinician: Referring Dannetta Lekas: Treating Onyx Edgley/Extender: Mertha Finders in Treatment: 2 Encounter Discharge Information Items Post Procedure Vitals Discharge  Condition: Stable Temperature (F): 98.3 Ambulatory Status: Ambulatory Pulse (bpm): 80 Discharge Destination: Home Respiratory Rate (breaths/min): 18 Transportation: Private Auto Blood Pressure (mmHg): 176/97 Accompanied By: spouse Schedule Follow-up Appointment: Yes Clinical Summary of Care: Patient Declined Electronic Signature(s) Signed: 12/12/2021 5:06:14 PM By: Baruch Gouty RN, BSN Entered By: Baruch Gouty on 12/12/2021 08:07:23 -------------------------------------------------------------------------------- Lower Extremity Assessment Details Patient Name: Date of Service: [name] 12/12/2021 7:30 A M Medical Record Number: [id number] Patient Account Number: [id number] Date of Birth/Sex: Treating RN: 1961-04-23 (61 y.o. Elam Dutch Primary Care Chamia Schmutz: [name] Other Clinician: Referring Mckayla Mulcahey: Treating Carlo Lorson/Extender: Mertha Finders in Treatment: 2 Edema Assessment Assessed: [Left: No] [Right: No] Edema: [Left: N] [Right: o] Calf Left: Right: Point of Measurement: From Medial Instep 33 cm Ankle Left: Right: Point of Measurement: From Medial Instep 21 cm Vascular Assessment Pulses: Dorsalis Pedis Palpable: [Left:Yes] Electronic Signature(s) Signed: 12/12/2021 5:06:14 PM By: Baruch Gouty RN, BSN Entered By: Baruch Gouty on 12/12/2021 07:49:05 -------------------------------------------------------------------------------- Multi Wound Chart Details Patient Name: Date of Service: [place], [place] 12/12/2021 7:30 A M Medical Record Number: [id number] Patient Account Number: [id number] Date of Birth/Sex: Treating RN: 1960-11-10 (61 y.o. Elam Dutch Primary Care Deante Blough: [name] Other Clinician: Referring Roshawn Lacina: Treating Gabino Hagin/Extender: Mertha Finders in Treatment: 2 Vital Signs Height(in): 28 Pulse(bpm): 41 Weight(lbs): 144 Blood Pressure(mmHg):  176/97 Body Mass Index(BMI): 25.5 Temperature(F): 98.3 Respiratory Rate(breaths/min): 18 Photos: [1:Left, Lateral Lower Leg] [N/A:N/A N/A] Wound Location: [1:Trauma] [N/A:N/A] Wounding Event: [1:Trauma, Other] [N/A:N/A] Primary Etiology: [1:Hypertension, Received Radiation] [N/A:N/A] Comorbid History: [1:11/07/2021] [N/A:N/A] Date Acquired: [1:2] [N/A:N/A] Weeks of Treatment: [1:Open] [N/A:N/A] Wound Status: [1:No] [N/A:N/A] Wound Recurrence: [1:1.8x3x0.1] [N/A:N/A] Measurements L x W x D (cm) [1:4.241] [N/A:N/A] A (cm) : rea [1:0.424] [N/A:N/A] Volume (cm) : [1:43.20%] [N/A:N/A] % Reduction in A rea: [1:71.60%] [N/A:N/A] % Reduction in Volume: [1:Full Thickness Without Exposed] [N/A:N/A] Classification: [1:Support Structures Medium] [N/A:N/A] Exudate A mount: [1:Serosanguineous] [N/A:N/A] Exudate Type: [1:red, brown] [N/A:N/A] Exudate Color: [  1:Flat and Intact] [N/A:N/A] Wound Margin: [1:Medium (34-66%)] [N/A:N/A] Granulation A mount: [1:Red] [N/A:N/A] Granulation Quality: [1:Medium (34-66%)] [N/A:N/A] Necrotic A mount: [1:Fat Layer (Subcutaneous Tissue): Yes N/A] Exposed Structures: [1:Fascia: No Tendon: No Muscle: No Joint: No Bone: No None] [N/A:N/A] Epithelialization: [1:Debridement - Excisional] [N/A:N/A] Debridement: Pre-procedure Verification/Time Out 07:55 [N/A:N/A] Taken: [1:Lidocaine 4% Topical Solution] [N/A:N/A] Pain Control: [1:Subcutaneous, Slough] [N/A:N/A] Tissue Debrided: [1:Skin/Subcutaneous Tissue] [N/A:N/A] Level: [1:5.4] [N/A:N/A] Debridement A (sq cm): [1:rea Curette] [N/A:N/A] Instrument: [1:Minimum] [N/A:N/A] Bleeding: [1:Pressure] [N/A:N/A] Hemostasis A chieved: [1:2] [N/A:N/A] Procedural Pain: [1:0] [N/A:N/A] Post Procedural Pain: [1:Procedure was tolerated well] [N/A:N/A] Debridement Treatment Response: [1:1.8x3x0.1] [N/A:N/A] Post Debridement Measurements L x W x D (cm) [1:0.424] [N/A:N/A] Post Debridement Volume: (cm) [1:Debridement]  [N/A:N/A] Treatment Notes Electronic Signature(s) Signed: 12/12/2021 8:00:36 AM By: Fredirick Maudlin MD FACS Signed: 12/12/2021 5:06:14 PM By: Baruch Gouty RN, BSN Entered By: Fredirick Maudlin on 12/12/2021 08:00:36 -------------------------------------------------------------------------------- Multi-Disciplinary Care Plan Details Patient Name: Date of Service: [name] 12/12/2021 7:30 A M Medical Record Number: [id number] Patient Account Number: [id number] Date of Birth/Sex: Treating RN: Dec 27, 1960 (61 y.o. Elam Dutch Primary Care Taeveon Keesling: [name] Other Clinician: Referring Shahzain Kiester: Treating Levander Katzenstein/Extender: Mertha Finders in Treatment: 2 Multidisciplinary Care Plan reviewed with physician Active Inactive Nutrition Nursing Diagnoses: Imbalanced nutrition Potential for alteratiion in Nutrition/Potential for imbalanced nutrition Goals: Patient/caregiver agrees to and verbalizes understanding of need to use nutritional supplements and/or vitamins as prescribed Date Initiated: 11/28/2021 Target Resolution Date: 12/26/2021 Goal Status: Active Interventions: Assess patient nutrition upon admission and as needed per policy Treatment Activities: Patient referred to Primary Care Physician for further nutritional evaluation : 11/28/2021 Notes: Wound/Skin Impairment Nursing Diagnoses: Impaired tissue integrity Knowledge deficit related to ulceration/compromised skin integrity Goals: Patient/caregiver will verbalize understanding of skin care regimen Date Initiated: 11/28/2021 Target Resolution Date: 12/26/2021 Goal Status: Active Ulcer/skin breakdown will have a volume reduction of 30% by week 4 Date Initiated: 11/28/2021 Target Resolution Date: 12/26/2021 Goal Status: Active Interventions: Assess patient/caregiver ability to obtain necessary supplies Assess patient/caregiver ability to perform ulcer/skin care regimen upon  admission and as needed Assess ulceration(s) every visit Provide education on ulcer and skin care Treatment Activities: Skin care regimen initiated : 11/28/2021 Topical wound management initiated : 11/28/2021 Notes: Electronic Signature(s) Signed: 12/12/2021 5:06:14 PM By: Baruch Gouty RN, BSN Entered By: Baruch Gouty on 12/12/2021 07:54:07 -------------------------------------------------------------------------------- Pain Assessment Details Patient Name: Date of Service: [name] 12/12/2021 7:30 A M Medical Record Number: [id number] Patient Account Number: [id number] Date of Birth/Sex: Treating RN: 19-Jul-1960 (61 y.o. Elam Dutch Primary Care Demiah Gullickson: [name] Other Clinician: Referring Patricia Perales: Treating Kingslee Dowse/Extender: Mertha Finders in Treatment: 2 Active Problems Location of Pain Severity and Description of Pain Patient Has Paino No Site Locations Rate the pain. Rate the pain. Current Pain Level: 0 Pain Management and Medication Current Pain Management: Electronic Signature(s) Signed: 12/12/2021 5:06:14 PM By: Baruch Gouty RN, BSN Entered By: Baruch Gouty on 12/12/2021 07:46:33 -------------------------------------------------------------------------------- Patient/Caregiver Education Details Patient Name: Date of Service: Lulu Riding 6/7/2023andnbsp7:30 [place] Record Number: [id number] Patient Account Number: [id number] Date of Birth/Gender: Treating RN: 06/04/1961 (61 y.o. Elam Dutch Primary Care Physician: [name] Other Clinician: Referring Physician: Treating Physician/Extender: Mertha Finders in Treatment: 2 Education Assessment Education Provided To: Patient Education Topics Provided Wound/Skin Impairment: Methods: Explain/Verbal Responses: Reinforcements needed, State content correctly [employer]) Signed: 12/12/2021 5:06:14  PM By: Baruch Gouty RN, BSN Entered By: Baruch Gouty  on 12/12/2021 07:54:27 -------------------------------------------------------------------------------- Wound Assessment Details Patient Name: Date of Service: LAREEN, MULLINGS 12/12/2021 7:30 A M Medical Record Number: [id number] Patient Account Number: [id number] Date of Birth/Sex: Treating RN: Nov 27, 1960 (61 y.o. Elam Dutch Primary Care Brannan Cassedy: [name] Other Clinician: Referring Darah Simkin: Treating Roxas Clymer/Extender: Mertha Finders in Treatment: 2 Wound Status Wound Number: 1 Primary Etiology: Trauma, Other Wound Location: Left, Lateral Lower Leg Wound Status: Open Wounding Event: Trauma Comorbid History: Hypertension, Received Radiation Date Acquired: 11/07/2021 Weeks Of Treatment: 2 Clustered Wound: No Photos Wound Measurements Length: (cm) 1.8 Width: (cm) 3 Depth: (cm) 0.1 Area: (cm) 4.241 Volume: (cm) 0.424 % Reduction in Area: 43.2% % Reduction in Volume: 71.6% Epithelialization: None Tunneling: No Undermining: No Wound Description Classification: Full Thickness Without Exposed Support Structures Wound Margin: Flat and Intact Exudate Amount: Medium Exudate Type: Serosanguineous Exudate Color: red, brown Foul Odor After Cleansing: No Slough/Fibrino Yes Wound Bed Granulation Amount: Medium (34-66%) Exposed Structure Granulation Quality: Red Fascia Exposed: No Necrotic Amount: Medium (34-66%) Fat Layer (Subcutaneous Tissue) Exposed: Yes Necrotic Quality: Adherent Slough Tendon Exposed: No Muscle Exposed: No Joint Exposed: No Bone Exposed: No Treatment Notes Wound #1 (Lower Leg) Wound Laterality: Left, Lateral Cleanser Normal Saline Discharge Instruction: Cleanse the wound with Normal Saline prior to applying a clean dressing using gauze sponges, not tissue or cotton balls. Peri-Wound Care Topical Primary Dressing Hydrofera Blue Classic Foam, 2x2  in Discharge Instruction: Moisten with saline prior to applying to wound bed Secondary Dressing Bordered Gauze, 4x4 in Discharge Instruction: Apply over primary dressing as directed. Secured With Compression Wrap Compression Stockings Environmental education officer) Signed: 12/12/2021 5:06:14 PM By: Baruch Gouty RN, BSN Entered By: Baruch Gouty on 12/12/2021 07:51:45 -------------------------------------------------------------------------------- Vitals Details Patient Name: Date of Service: [name] 12/12/2021 7:30 A M Medical Record Number: [id number] Patient Account Number: [id number] Date of Birth/Sex: Treating RN: 1960/07/26 (61 y.o. Elam Dutch Primary Care Karess Harner: [name] Other Clinician: Referring Zali Kamaka: Treating Lakesa Coste/Extender: Mertha Finders in Treatment: 2 Vital Signs Time Taken: 07:45 Temperature (F): 98.3 Height (in): 63 Pulse (bpm): 80 Weight (lbs): 144 Respiratory Rate (breaths/min): 18 Body Mass Index (BMI): 25.5 Blood Pressure (mmHg): 176/97 Reference Range: 80 - 120 mg / dl Electronic Signature(s) Signed: 12/12/2021 5:06:14 PM By: Baruch Gouty RN, BSN Entered By: Baruch Gouty on 12/12/2021 07:46:26

## 2021-12-17 ENCOUNTER — Telehealth: Payer: Self-pay

## 2021-12-17 ENCOUNTER — Other Ambulatory Visit: Payer: Self-pay | Admitting: Family Medicine

## 2021-12-17 MED ORDER — TRAMADOL HCL 50 MG PO TABS
50.0000 mg | ORAL_TABLET | Freq: Four times a day (QID) | ORAL | 0 refills | Status: AC | PRN
Start: 2021-12-17 — End: 2022-01-16

## 2021-12-17 NOTE — Telephone Encounter (Signed)
Pt is requesting a refill. 

## 2021-12-17 NOTE — Telephone Encounter (Signed)
Pharmacy faxed a refill request for traMADol (ULTRAM) 50 MG tablet [[id number]]  ENDED   Order Details Dose: 50 mg Route: Oral Frequency: Every 6 hours PRN  Dispense Quantity: 120 tablet Refills: 0        Sig: Take 1 tablet (50 mg total) by mouth every 6 (six) hours as needed.       Start Date: 11/09/21 End Date: 12/09/21  Written Date: 11/09/21 Expiration Date: 05/08/22  Original Order:  traMADol (ULTRAM) 50 MG tablet [[id number]]

## 2021-12-19 ENCOUNTER — Encounter (HOSPITAL_BASED_OUTPATIENT_CLINIC_OR_DEPARTMENT_OTHER): Payer: 59 | Admitting: General Surgery

## 2021-12-19 DIAGNOSIS — Z923 Personal history of irradiation: Secondary | ICD-10-CM | POA: Diagnosis not present

## 2021-12-19 DIAGNOSIS — L97822 Non-pressure chronic ulcer of other part of left lower leg with fat layer exposed: Secondary | ICD-10-CM | POA: Diagnosis not present

## 2021-12-19 DIAGNOSIS — K219 Gastro-esophageal reflux disease without esophagitis: Secondary | ICD-10-CM | POA: Diagnosis not present

## 2021-12-19 DIAGNOSIS — Z853 Personal history of malignant neoplasm of breast: Secondary | ICD-10-CM | POA: Diagnosis not present

## 2021-12-19 DIAGNOSIS — I10 Essential (primary) hypertension: Secondary | ICD-10-CM | POA: Diagnosis not present

## 2021-12-19 NOTE — Progress Notes (Signed)
Erika Harvey, Erika Harvey ([id number]) Visit Report for 12/19/2021 Arrival Information Details Patient Name: Date of Service: Erika Harvey, Erika Harvey 12/19/2021 8:15 A M Medical Record Number: [id number] Patient Account Number: [id number] Date of Birth/Sex: Treating RN: 1961/02/18 (61 y.o. Erika Harvey, Erika Harvey Primary Care Erika Harvey: Erika Harvey Other Clinician: Referring Erika Harvey: Treating Erika Harvey/Extender: Erika Harvey in Treatment: 3 Visit Information History Since Last Visit Added or deleted any medications: No Patient Arrived: Ambulatory Any new allergies or adverse reactions: No Arrival Time: 08:33 Had a fall or experienced change in No Accompanied By: spouse activities of daily living that may affect Transfer Assistance: None risk of falls: Patient Identification Verified: Yes Signs or symptoms of abuse/neglect since last visito No Secondary Verification Process Completed: Yes Hospitalized since last visit: No Patient Requires Transmission-Based Precautions: No Implantable device outside of the clinic excluding No Patient Has Alerts: No cellular tissue based products placed in the center since last visit: Has Dressing in Place as Prescribed: Yes Pain Present Now: No Electronic Signature(s) Signed: 12/19/2021 5:43:09 PM By: Erika Gouty RN, BSN Entered By: Erika Harvey on 12/19/2021 08:33:49 -------------------------------------------------------------------------------- Encounter Discharge Information Details Patient Name: Date of Service: Erika Councilman A. 12/19/2021 8:15 A M Medical Record Number: [id number] Patient Account Number: [id number] Date of Birth/Sex: Treating RN: 07/13/1960 (61 y.o. Erika Harvey Primary Care Erika Harvey: Erika Harvey Other Clinician: Referring Lawana Hartzell: Treating Erika Harvey/Extender: Erika Harvey in Treatment: 3 Encounter Discharge Information Items Post Procedure Vitals Discharge  Condition: Stable Temperature (F): 98 Ambulatory Status: Ambulatory Pulse (bpm): 87 Discharge Destination: Home Respiratory Rate (breaths/min): 18 Transportation: Private Auto Blood Pressure (mmHg): 162/90 Accompanied By: spouse Schedule Follow-up Appointment: Yes Clinical Summary of Care: Patient Declined Electronic Signature(s) Signed: 12/19/2021 5:43:09 PM By: Erika Gouty RN, BSN Entered By: Erika Harvey on 12/19/2021 08:53:09 -------------------------------------------------------------------------------- Lower Extremity Assessment Details Patient Name: Date of Service: Erika Councilman A. 12/19/2021 8:15 A M Medical Record Number: [id number] Patient Account Number: [id number] Date of Birth/Sex: Treating RN: [birth year] (61 y.o. Erika Harvey Primary Care Erika Harvey: Erika Harvey Other Clinician: Referring Erika Harvey: Treating Erika Harvey/Extender: Erika Harvey in Treatment: 3 Edema Assessment Assessed: [Left: No] [Right: No] Edema: [Left: N] [Right: o] Calf Left: Right: Point of Measurement: From Medial Instep 33 cm Ankle Left: Right: Point of Measurement: From Medial Instep 21 cm Vascular Assessment Pulses: Dorsalis Pedis Palpable: [Left:Yes] Electronic Signature(s) Signed: 12/19/2021 5:43:09 PM By: Erika Gouty RN, BSN Entered By: Erika Harvey on 12/19/2021 08:34:34 -------------------------------------------------------------------------------- [place] Details Patient Name: Date of Service: [name], [name] 12/19/2021 8:15 A M Medical Record Number: [id number] Patient Account Number: [id number] Date of Birth/Sex: Treating RN: [birth year] (61 y.o. Erika Harvey Primary Care Erika Harvey: Erika Harvey Other Clinician: Referring Erika Harvey: Treating Erika Harvey/Extender: Erika Harvey in Treatment: 3 Multidisciplinary Care Plan reviewed with physician Active  Inactive Nutrition Nursing Diagnoses: Imbalanced nutrition Potential for alteratiion in Nutrition/Potential for imbalanced nutrition Goals: Patient/caregiver agrees to and verbalizes understanding of need to use nutritional supplements and/or vitamins as prescribed Date Initiated: 11/28/2021 [place] Resolution Date: 12/26/2021 Goal Status: Active Interventions: Assess patient nutrition upon admission and as needed per policy Treatment Activities: Patient referred to Primary Care Physician for further nutritional evaluation : 11/28/2021 Notes: Wound/Skin Impairment Nursing Diagnoses: Impaired tissue integrity Knowledge deficit related to ulceration/compromised skin integrity Goals: Patient/caregiver will verbalize understanding of skin care regimen Date Initiated: 11/28/2021 Target Resolution Date: 12/26/2021 Goal Status: Active Ulcer/skin breakdown will have a volume reduction of  30% by week 4 Date Initiated: 11/28/2021 Target Resolution Date: 12/26/2021 Goal Status: Active Interventions: Assess patient/caregiver ability to obtain necessary supplies Assess patient/caregiver ability to perform ulcer/skin care regimen upon admission and as needed Assess ulceration(s) every visit Provide education on ulcer and skin care Treatment Activities: Skin care regimen initiated : 11/28/2021 Topical wound management initiated : 11/28/2021 Notes: Electronic Signature(s) Signed: 12/19/2021 5:43:09 PM By: Erika Gouty RN, BSN Entered By: Erika Harvey on 12/19/2021 08:40:53 -------------------------------------------------------------------------------- Pain Assessment Details Patient Name: Date of Service: Erika Councilman A. 12/19/2021 8:15 A M Medical Record Number: [id number] Patient Account Number: [id number] Date of Birth/Sex: Treating RN: 03-27-1961 (61 y.o. Erika Harvey Primary Care Erika Harvey: Erika Harvey Other Clinician: Referring Erika Harvey: Treating Nello Corro/Extender:  Erika Harvey in Treatment: 3 Active Problems Location of Pain Severity and Description of Pain Patient Has Paino No Site Locations Rate the pain. Current Pain Level: 0 Pain Management and Medication Current Pain Management: Electronic Signature(s) Signed: 12/19/2021 5:43:09 PM By: Erika Gouty RN, BSN Entered By: Erika Harvey on 12/19/2021 08:34:18 -------------------------------------------------------------------------------- Patient/Caregiver Education Details Patient Name: Date of Service: Erika Harvey 6/14/2023andnbsp8:15 [place] Record Number: [id number] Patient Account Number: [id number] Date of Birth/Gender: Treating RN: [birth year] (61 y.o. Erika Harvey Primary Care Physician: Erika Harvey Other Clinician: Referring Physician: Treating Physician/Extender: Erika Harvey in Treatment: 3 Education Assessment Education Provided To: Patient Education Topics Provided Wound/Skin Impairment: Methods: Explain/Verbal Responses: Reinforcements needed, State content correctly Electronic Signature(s) Signed: 12/19/2021 5:43:09 PM By: Erika Gouty RN, BSN Entered By: Erika Harvey on 12/19/2021 08:41:08 -------------------------------------------------------------------------------- Wound Assessment Details Patient Name: Date of Service: Erika Harvey, Erika Harvey 12/19/2021 8:15 A M Medical Record Number: [id number] Patient Account Number: [id number] Date of Birth/Sex: Treating RN: 07-02-1961 (62 y.o. Erika Harvey Primary Care Xoey Warmoth: Erika Harvey Other Clinician: Referring Carrisa Keller: Treating Rebecah Dangerfield/Extender: Erika Harvey in Treatment: 3 Wound Status Wound Number: 1 Primary Etiology: Trauma, Other Wound Location: Left, Lateral Lower Leg Wound Status: Open Wounding Event: Trauma Comorbid History: Hypertension, Received Radiation Date Acquired:  11/07/2021 Weeks Of Treatment: 3 Clustered Wound: No Photos Wound Measurements Length: (cm) 1.5 Width: (cm) 2.5 Depth: (cm) 0.1 Area: (cm) 2.945 Volume: (cm) 0.295 % Reduction in Area: 60.5% % Reduction in Volume: 80.2% Epithelialization: Small (1-33%) Tunneling: No Undermining: No Wound Description Classification: Full Thickness Without Exposed Support Structures Wound Margin: Flat and Intact Exudate Amount: Medium Exudate Type: Serosanguineous Exudate Color: red, brown Foul Odor After Cleansing: No Slough/Fibrino Yes Wound Bed Granulation Amount: Medium (34-66%) Exposed Structure Granulation Quality: Red Fascia Exposed: No Necrotic Amount: Medium (34-66%) Fat Layer (Subcutaneous Tissue) Exposed: Yes Necrotic Quality: Adherent Slough Tendon Exposed: No Muscle Exposed: No Joint Exposed: No Bone Exposed: No Treatment Notes Wound #1 (Lower Leg) Wound Laterality: Left, Lateral Cleanser Normal Saline Discharge Instruction: Cleanse the wound with Normal Saline prior to applying a clean dressing using gauze sponges, not tissue or cotton balls. Peri-Wound Care Topical Primary Dressing Hydrofera Blue Classic Foam, 2x2 in Discharge Instruction: Moisten with saline prior to applying to wound bed Secondary Dressing Bordered Gauze, 4x4 in Discharge Instruction: Apply over primary dressing as directed. Secured With Compression Wrap Compression Stockings Environmental education officer) Signed: 12/19/2021 5:43:09 PM By: Erika Gouty RN, BSN Entered By: Erika Harvey on 12/19/2021 08:38:54 -------------------------------------------------------------------------------- Erika Harvey Details Patient Name: Date of Service: Erika Councilman A. 12/19/2021 8:15 A M Medical Record Number: [id number] Patient Account Number: [id number] Date of Birth/Sex: Treating RN: [birth year] (61 y.o.  Erika Harvey Primary Care Sarajean Dessert: Erika Harvey Other Clinician: Referring  Ossiel Marchio: Treating Rashonda Warrior/Extender: Erika Harvey in Treatment: 3 Vital Signs Time Taken: 08:33 Temperature (F): 98 Height (in): 63 Pulse (bpm): 87 Source: Stated Respiratory Rate (breaths/min): 18 Weight (lbs): 144 Blood Pressure (mmHg): 162/90 Source: Stated Reference Range: 80 - 120 mg / dl Body Mass Index (BMI): 25.5 Electronic Signature(s) Signed: 12/19/2021 5:43:09 PM By: Erika Gouty RN, BSN Entered By: Erika Harvey on 12/19/2021 08:34:11

## 2021-12-19 NOTE — Progress Notes (Signed)
KARIN, PINEDO ([id number]) Visit Report for 12/19/2021 Debridement Details Patient Name: Date of Service: NIESHA, BAME 12/19/2021 8:15 A M Medical Record Number: [id number] Patient Account Number: [id number] Date of Birth/Sex: Treating RN: Jul 13, 1960 (61 y.o. Martyn Malay, Linda Primary Care Provider: [name] Other Clinician: Referring Provider: Treating Provider/Extender: Mertha Finders in Treatment: 3 Debridement Performed for Assessment: Wound #1 Left,Lateral Lower Leg Performed By: Physician Fredirick Maudlin, MD Debridement Type: Debridement Level of Consciousness (Pre-procedure): Awake and Alert Pre-procedure Verification/Time Out Yes - 08:40 Taken: Start Time: 08:43 Pain Control: Lidocaine 4% T opical Solution T Area Debrided (L x W): otal 1.5 (cm) x 2.5 (cm) = 3.75 (cm) Tissue and other material debrided: Viable, Non-Viable, Slough, Subcutaneous, Slough Level: Skin/Subcutaneous Tissue Debridement Description: Excisional Instrument: Curette Bleeding: Minimum Hemostasis Achieved: Pressure Procedural Pain: 0 Post Procedural Pain: 0 Response to Treatment: Procedure was tolerated well Level of Consciousness (Post- Awake and Alert procedure): Post Debridement Measurements of Total Wound Length: (cm) 1.5 Width: (cm) 2.5 Depth: (cm) 0.1 Volume: (cm) 0.295 Character of Wound/Ulcer Post Debridement: Improved Post Procedure Diagnosis Same as Pre-procedure Electronic Signature(s) Signed: 12/19/2021 9:11:58 AM By: Fredirick Maudlin MD FACS Signed: 12/19/2021 5:43:09 PM By: Baruch Gouty RN, BSN Entered By: Baruch Gouty on 12/19/2021 08:45:10 -------------------------------------------------------------------------------- Physician Orders Details Patient Name: Date of Service: [name] 12/19/2021 8:15 A M Medical Record Number: [id number] Patient Account Number: [id number] Date of Birth/Sex: Treating RN: 1961-05-11  (61 y.o. Elam Dutch Primary Care Provider: [name] Other Clinician: Referring Provider: Treating Provider/Extender: Mertha Finders in Treatment: 3 Verbal / Phone Orders: No Diagnosis Coding ICD-10 Coding Code Description I10 Essential (primary) hypertension L97.822 Non-pressure chronic ulcer of other part of left lower leg with fat layer exposed Follow-up Appointments ppointment in 1 week. - Dr. Celine Ahr RM 1 with [name] Return A Wed 6/21 @ 0815 am Bathing/ Shower/ Hygiene May shower and wash wound with soap and water. - with dressing changes Wound Treatment Wound #1 - Lower Leg Wound Laterality: Left, Lateral Cleanser: Normal Saline (Generic) Every Other Day/30 Days Discharge Instructions: Cleanse the wound with Normal Saline prior to applying a clean dressing using gauze sponges, not tissue or cotton balls. Prim Dressing: Hydrofera Blue Classic Foam, 2x2 in (Generic) Every Other Day/30 Days ary Discharge Instructions: Moisten with saline prior to applying to wound bed Secondary Dressing: Bordered Gauze, 4x4 in (Generic) Every Other Day/30 Days Discharge Instructions: Apply over primary dressing as directed. Electronic Signature(s) Signed: 12/19/2021 9:11:58 AM By: Fredirick Maudlin MD FACS Signed: 12/19/2021 5:43:09 PM By: Baruch Gouty RN, BSN Entered By: Baruch Gouty on 12/19/2021 08:46:32 -------------------------------------------------------------------------------- Problem List Details Patient Name: Date of Service: [name] 12/19/2021 8:15 A M Medical Record Number: [id number] Patient Account Number: [id number] Date of Birth/Sex: Treating RN: 1960-10-10 (61 y.o. Elam Dutch Primary Care Provider: [name] Other Clinician: Referring Provider: Treating Provider/Extender: Mertha Finders in Treatment: 3 Active Problems ICD-10 Encounter Code Description Active Date  MDM Diagnosis I10 Essential (primary) hypertension 11/28/2021 No Yes L97.822 Non-pressure chronic ulcer of other part of left lower leg with fat layer exposed5/24/2023 No Yes Inactive Problems Resolved Problems Electronic Signature(s) Signed: 12/19/2021 9:11:58 AM By: Fredirick Maudlin MD FACS Signed: 12/19/2021 5:43:09 PM By: Baruch Gouty RN, BSN Entered By: Baruch Gouty on 12/19/2021 08:40:42 -------------------------------------------------------------------------------- [employer] Details Patient Name: Date of Service: [name] 12/19/2021 Medical Record Number: [id number] Patient Account Number: [id number] Date of Birth/Sex: Treating  RN: July 26, 1960 (61 y.o. Elam Dutch Primary Care Provider: [name] Other Clinician: Referring Provider: Treating Provider/Extender: Mertha Finders in Treatment: 3 Diagnosis Coding ICD-10 Codes Code Description I10 Essential (primary) hypertension [phone] Non-pressure chronic ulcer of other part of left lower leg with fat layer exposed Facility Procedures CPT4 Code: [id number] Description: [place] TISSUE 20 SQ CM/< ICD-10 Diagnosis Description L97.822 Non-pressure chronic ulcer of other part of left lower leg with fat layer expo Modifier: sed Quantity: 1 Physician Procedures : CPT4 Code Description Modifier [id number] - WC PHYS SUBQ TISS 20 SQ CM ICD-10 Diagnosis Description L97.822 Non-pressure chronic ulcer of other part of left lower leg with fat layer exposed Quantity: 1 Electronic Signature(s) Signed: 12/19/2021 9:11:58 AM By: Fredirick Maudlin MD FACS Signed: 12/19/2021 5:43:09 PM By: Baruch Gouty RN, BSN Entered By: Baruch Gouty on 12/19/2021 08:52:10

## 2021-12-26 ENCOUNTER — Encounter (HOSPITAL_BASED_OUTPATIENT_CLINIC_OR_DEPARTMENT_OTHER): Payer: 59 | Admitting: General Surgery

## 2021-12-26 DIAGNOSIS — Z853 Personal history of malignant neoplasm of breast: Secondary | ICD-10-CM | POA: Diagnosis not present

## 2021-12-26 DIAGNOSIS — K219 Gastro-esophageal reflux disease without esophagitis: Secondary | ICD-10-CM | POA: Diagnosis not present

## 2021-12-26 DIAGNOSIS — L97822 Non-pressure chronic ulcer of other part of left lower leg with fat layer exposed: Secondary | ICD-10-CM | POA: Diagnosis not present

## 2021-12-26 DIAGNOSIS — Z923 Personal history of irradiation: Secondary | ICD-10-CM | POA: Diagnosis not present

## 2021-12-26 DIAGNOSIS — I10 Essential (primary) hypertension: Secondary | ICD-10-CM | POA: Diagnosis not present

## 2021-12-26 NOTE — Progress Notes (Signed)
MADLYN, CROSBY ([id number]) Visit Report for 12/26/2021 Arrival Information Details Patient Name: Date of Service: Erika, Harvey 12/26/2021 8:15 A M Medical Record Number: [id number] Patient Account Number: [id number] Date of Birth/Sex: Treating RN: 06-10-1961 (61 y.o. Tonita Phoenix, Lauren Primary Care Dijuan Sleeth: [name] Other Clinician: Referring Kathleen Tamm: Treating Roneshia Drew/Extender: Mertha Finders in Treatment: 4 Visit Information History Since Last Visit Added or deleted any medications: No Patient Arrived: Ambulatory Any new allergies or adverse reactions: No Arrival Time: 08:40 Had a fall or experienced change in No Accompanied By: husband activities of daily living that may affect Transfer Assistance: None risk of falls: Patient Identification Verified: Yes Signs or symptoms of abuse/neglect since last visito No Secondary Verification Process Completed: Yes Hospitalized since last visit: No Patient Requires Transmission-Based Precautions: No Implantable device outside of the clinic excluding No Patient Has Alerts: No cellular tissue based products placed in the center since last visit: Has Dressing in Place as Prescribed: Yes Pain Present Now: No Electronic Signature(s) Signed: 12/26/2021 4:06:51 PM By: Rhae Hammock RN Entered By: Rhae Hammock on 12/26/2021 08:40:49 -------------------------------------------------------------------------------- Encounter Discharge Information Details Patient Name: Date of Service: Erika Councilman A. 12/26/2021 8:15 A M Medical Record Number: [id number] Patient Account Number: [id number] Date of Birth/Sex: Treating RN: 1961/02/04 (61 y.o. Elam Dutch Primary Care Tyson Masin: [name] Other Clinician: Referring Logon Uttech: Treating Timisha Mondry/Extender: Mertha Finders in Treatment: 4 Encounter Discharge Information Items Post Procedure  Vitals Discharge Condition: Stable Temperature (F): 97.8 Ambulatory Status: Ambulatory Pulse (bpm): 86 Discharge Destination: Home Respiratory Rate (breaths/min): 18 Transportation: Private Auto Blood Pressure (mmHg): 138/84 Accompanied By: spouse Schedule Follow-up Appointment: Yes Clinical Summary of Care: Patient Declined Electronic Signature(s) Signed: 12/26/2021 6:21:16 PM By: Baruch Gouty RN, BSN Entered By: Baruch Gouty on 12/26/2021 09:00:47 -------------------------------------------------------------------------------- Lower Extremity Assessment Details Patient Name: Date of Service: Erika Councilman A. 12/26/2021 8:15 A M Medical Record Number: [id number] Patient Account Number: [id number] Date of Birth/Sex: Treating RN: 22-Apr-1961 (61 y.o. Tonita Phoenix, Lauren Primary Care Sher Hellinger: [name] Other Clinician: Referring Helene Bernstein: Treating Jenesis Martin/Extender: Mertha Finders in Treatment: 4 Edema Assessment Assessed: Shirlyn Goltz: Yes] [Right: No] Edema: [Left: N] [Right: o] Calf Left: Right: Point of Measurement: From Medial Instep 33 cm Ankle Left: Right: Point of Measurement: From Medial Instep 21 cm Vascular Assessment Pulses: Dorsalis Pedis Palpable: [Left:Yes] Posterior Tibial Palpable: [Left:Yes] Electronic Signature(s) Signed: 12/26/2021 4:06:51 PM By: Rhae Hammock RN Entered By: Rhae Hammock on 12/26/2021 08:44:14 -------------------------------------------------------------------------------- Multi Wound Chart Details Patient Name: Date of Service: Erika Councilman A. 12/26/2021 8:15 A M Medical Record Number: [id number] Patient Account Number: [id number] Date of Birth/Sex: Treating RN: 10/28/1960 (61 y.o. Martyn Malay, Linda Primary Care Christop Hippert: [name] Other Clinician: Referring Zephyr Ridley: Treating Lillee Mooneyhan/Extender: Mertha Finders in Treatment: 4 Vital  Signs Height(in): 63 Pulse(bpm): 18 Weight(lbs): 144 Blood Pressure(mmHg): 138/84 Body Mass Index(BMI): 25.5 Temperature(F): 97.86 Respiratory Rate(breaths/min): 17 Photos: [1:Left, Lateral Lower Leg] [N/A:N/A N/A] Wound Location: [1:Trauma] [N/A:N/A] Wounding Event: [1:Trauma, Other] [N/A:N/A] Primary Etiology: [1:Hypertension, Received Radiation] [N/A:N/A] Comorbid History: [1:11/07/2021] [N/A:N/A] Date Acquired: [1:4] [N/A:N/A] Weeks of Treatment: [1:Open] [N/A:N/A] Wound Status: [1:No] [N/A:N/A] Wound Recurrence: [1:1x1.7x0.1] [N/A:N/A] Measurements L x W x D (cm) [1:1.335] [N/A:N/A] A (cm) : rea [1:0.134] [N/A:N/A] Volume (cm) : [1:82.10%] [N/A:N/A] % Reduction in A rea: [1:91.00%] [N/A:N/A] % Reduction in Volume: [1:Full Thickness Without Exposed] [N/A:N/A] Classification: [1:Support Structures Medium] [N/A:N/A] Exudate A mount: [1:Serosanguineous] [N/A:N/A] Exudate Type: [1:red, brown] [N/A:N/A]  Exudate Color: [1:Flat and Intact] [N/A:N/A] Wound Margin: [1:Medium (34-66%)] [N/A:N/A] Granulation A mount: [1:Red] [N/A:N/A] Granulation Quality: [1:Medium (34-66%)] [N/A:N/A] Necrotic A mount: [1:Fat Layer (Subcutaneous Tissue): Yes N/A] Exposed Structures: [1:Fascia: No Tendon: No Muscle: No Joint: No Bone: No Small (1-33%)] [N/A:N/A] Epithelialization: [1:Debridement - Selective/Open Wound N/A] Debridement: Pre-procedure Verification/Time Out 08:50 [N/A:N/A] Taken: [1:Lidocaine 4% Topical Solution] [N/A:N/A] Pain Control: [1:Slough] [N/A:N/A] Tissue Debrided: [1:Skin/Epidermis] [N/A:N/A] Level: [1:1.7] [N/A:N/A] Debridement A (sq cm): [1:rea Curette] [N/A:N/A] Instrument: [1:Minimum] [N/A:N/A] Bleeding: [1:Pressure] [N/A:N/A] Hemostasis A chieved: [1:1] [N/A:N/A] Procedural Pain: [1:0] [N/A:N/A] Post Procedural Pain: [1:Procedure was tolerated well] [N/A:N/A] Debridement Treatment Response: [1:1x1.7x0.1] [N/A:N/A] Post Debridement Measurements L x W x D (cm)  [1:0.134] [N/A:N/A] Post Debridement Volume: (cm) [1:Debridement] [N/A:N/A] Treatment Notes Wound #1 (Lower Leg) Wound Laterality: Left, Lateral Cleanser Normal Saline Discharge Instruction: Cleanse the wound with Normal Saline prior to applying a clean dressing using gauze sponges, not tissue or cotton balls. Peri-Wound Care Topical Primary Dressing Hydrofera Blue Classic Foam, 2x2 in Discharge Instruction: Moisten with saline prior to applying to wound bed Secondary Dressing Bordered Gauze, 4x4 in Discharge Instruction: Apply over primary dressing as directed. Secured With Compression Wrap Compression Stockings Environmental education officer) Signed: 12/26/2021 9:05:12 AM By: Fredirick Maudlin MD FACS Signed: 12/26/2021 6:21:16 PM By: Baruch Gouty RN, BSN Signed: 12/26/2021 6:21:16 PM By: Baruch Gouty RN, BSN Entered By: Fredirick Maudlin on 12/26/2021 09:05:12 -------------------------------------------------------------------------------- Multi-Disciplinary Care Plan Details Patient Name: Date of Service: [place], [place] 12/26/2021 8:15 A M Medical Record Number: [id number] Patient Account Number: [id number] Date of Birth/Sex: Treating RN: January 26, 1961 (61 y.o. Elam Dutch Primary Care Eriverto Byrnes: [name] Other Clinician: Referring Kanyia Heaslip: Treating Monterrius Cardosa/Extender: Mertha Finders in Treatment: 4 Multidisciplinary Care Plan reviewed with physician Active Inactive Nutrition Nursing Diagnoses: Imbalanced nutrition Potential for alteratiion in Nutrition/Potential for imbalanced nutrition Goals: Patient/caregiver agrees to and verbalizes understanding of need to use nutritional supplements and/or vitamins as prescribed Date Initiated: 11/28/2021 Target Resolution Date: 01/23/2022 Goal Status: Active Interventions: Assess patient nutrition upon admission and as needed per policy Treatment Activities: Patient referred to  Primary Care Physician for further nutritional evaluation : 11/28/2021 Notes: Wound/Skin Impairment Nursing Diagnoses: Impaired tissue integrity Knowledge deficit related to ulceration/compromised skin integrity Goals: Patient/caregiver will verbalize understanding of skin care regimen Date Initiated: 11/28/2021 Target Resolution Date: 01/23/2022 Goal Status: Active Ulcer/skin breakdown will have a volume reduction of 30% by week 4 Date Initiated: 11/28/2021 Date Inactivated: 12/26/2021 Target Resolution Date: 12/26/2021 Goal Status: Met Ulcer/skin breakdown will have a volume reduction of 50% by week 8 Date Initiated: 12/26/2021 Target Resolution Date: 01/23/2022 Goal Status: Active Interventions: Assess patient/caregiver ability to obtain necessary supplies Assess patient/caregiver ability to perform ulcer/skin care regimen upon admission and as needed Assess ulceration(s) every visit Provide education on ulcer and skin care Treatment Activities: Skin care regimen initiated : 11/28/2021 Topical wound management initiated : 11/28/2021 Notes: Electronic Signature(s) Signed: 12/26/2021 6:21:16 PM By: Baruch Gouty RN, BSN Entered By: Baruch Gouty on 12/26/2021 08:47:41 -------------------------------------------------------------------------------- Pain Assessment Details Patient Name: Date of Service: Erika Councilman A. 12/26/2021 8:15 A M Medical Record Number: [id number] Patient Account Number: [id number] Date of Birth/Sex: Treating RN: [birth year] (61 y.o. Tonita Phoenix, Lauren Primary Care Jawann Urbani: [name] Other Clinician: Referring Chiniqua Kilcrease: Treating Tayen Narang/Extender: Mertha Finders in Treatment: 4 Active Problems Location of Pain Severity and Description of Pain Patient Has Paino No Site Locations Pain Management and Medication Current Pain Management: Electronic Signature(s) Signed: 12/26/2021 4:06:51  PM By: Rhae Hammock  RN Entered By: Rhae Hammock on 12/26/2021 08:41:43 -------------------------------------------------------------------------------- Patient/Caregiver Education Details Patient Name: Date of Service: Erika Harvey 6/21/2023andnbsp8:15 [place] Record Number: [id number] Patient Account Number: [id number] Date of Birth/Gender: Treating RN: 03/27/1961 (60 y.o. Elam Dutch Primary Care Physician: [name] Other Clinician: Referring Physician: Treating Physician/Extender: Mertha Finders in Treatment: 4 Education Assessment Education Provided To: Patient Education Topics Provided Wound/Skin Impairment: Methods: Explain/Verbal Responses: Reinforcements needed, State content correctly [employer]) Signed: 12/26/2021 6:21:16 PM By: Baruch Gouty RN, BSN Entered By: Baruch Gouty on 12/26/2021 08:48:02 -------------------------------------------------------------------------------- Wound Assessment Details Patient Name: Date of Service: [place], [place] 12/26/2021 8:15 A M Medical Record Number: [id number] Patient Account Number: [id number] Date of Birth/Sex: Treating RN: Nov 13, 1960 (61 y.o. Tonita Phoenix, Lauren Primary Care Jonalyn Sedlak: [name] Other Clinician: Referring Alexius Hangartner: Treating Madelynn Malson/Extender: Mertha Finders in Treatment: 4 Wound Status Wound Number: 1 Primary Etiology: Trauma, Other Wound Location: Left, Lateral Lower Leg Wound Status: Open Wounding Event: Trauma Comorbid History: Hypertension, Received Radiation Date Acquired: 11/07/2021 Weeks Of Treatment: 4 Clustered Wound: No Photos Wound Measurements Length: (cm) 1 Width: (cm) 1.7 Depth: (cm) 0.1 Area: (cm) 1.335 Volume: (cm) 0.134 % Reduction in Area: 82.1% % Reduction in Volume: 91% Epithelialization: Small (1-33%) Tunneling: No Undermining: No Wound Description Classification: Full Thickness  Without Exposed Support Structures Wound Margin: Flat and Intact Exudate Amount: Medium Exudate Type: Serosanguineous Exudate Color: red, brown Foul Odor After Cleansing: No Slough/Fibrino Yes Wound Bed Granulation Amount: Medium (34-66%) Exposed Structure Granulation Quality: Red Fascia Exposed: No Necrotic Amount: Medium (34-66%) Fat Layer (Subcutaneous Tissue) Exposed: Yes Necrotic Quality: Adherent Slough Tendon Exposed: No Muscle Exposed: No Joint Exposed: No Bone Exposed: No Treatment Notes Wound #1 (Lower Leg) Wound Laterality: Left, Lateral Cleanser Normal Saline Discharge Instruction: Cleanse the wound with Normal Saline prior to applying a clean dressing using gauze sponges, not tissue or cotton balls. Peri-Wound Care Topical Primary Dressing Hydrofera Blue Classic Foam, 2x2 in Discharge Instruction: Moisten with saline prior to applying to wound bed Secondary Dressing Bordered Gauze, 4x4 in Discharge Instruction: Apply over primary dressing as directed. Secured With Compression Wrap Compression Stockings Environmental education officer) Signed: 12/26/2021 4:06:51 PM By: Rhae Hammock RN Entered By: Rhae Hammock on 12/26/2021 08:45:36 -------------------------------------------------------------------------------- Vitals Details Patient Name: Date of Service: Erika Councilman A. 12/26/2021 8:15 A M Medical Record Number: [id number] Patient Account Number: [id number] Date of Birth/Sex: Treating RN: April 08, 1961 (61 y.o. Tonita Phoenix, Lauren Primary Care Tavyn Kurka: [name] Other Clinician: Referring Jonmichael Beadnell: Treating Jahron Hunsinger/Extender: Mertha Finders in Treatment: 4 Vital Signs Time Taken: 08:40 Temperature (F): 97.86 Height (in): 63 Pulse (bpm): 86 Weight (lbs): 144 Respiratory Rate (breaths/min): 17 Body Mass Index (BMI): 25.5 Blood Pressure (mmHg): 138/84 Reference Range: 80 - 120 mg / dl Electronic  Signature(s) Signed: 12/26/2021 4:06:51 PM By: Rhae Hammock RN Entered By: Rhae Hammock on 12/26/2021 08:41:16

## 2021-12-26 NOTE — Progress Notes (Signed)
Erika Harvey, Erika Harvey ([id number]) Visit Report for 12/26/2021 Chief Complaint Document Details Patient Name: Date of Service: Erika, Harvey 12/26/2021 8:15 A M Medical Record Number: [id number] Patient Account Number: [id number] Date of Birth/Sex: Treating RN: Oct 04, 1960 (61 y.o. Elam Dutch Primary Care Provider: [name] Other Clinician: Referring Provider: Treating Provider/Extender: Mertha Finders in Treatment: 4 Information Obtained from: Patient Chief Complaint Patient seen for complaints of Non-Healing Wound. Electronic Signature(s) Signed: 12/26/2021 9:05:17 AM By: Fredirick Maudlin MD FACS Entered By: Fredirick Maudlin on 12/26/2021 09:05:17 -------------------------------------------------------------------------------- Debridement Details Patient Name: Date of Service: Erika Councilman A. 12/26/2021 8:15 A M Medical Record Number: [id number] Patient Account Number: [id number] Date of Birth/Sex: Treating RN: 10/06/1960 (61 y.o. Elam Dutch Primary Care Provider: [name] Other Clinician: Referring Provider: Treating Provider/Extender: Mertha Finders in Treatment: 4 Debridement Performed for Assessment: Wound #1 Left,Lateral Lower Leg Performed By: Physician Fredirick Maudlin, MD Debridement Type: Debridement Level of Consciousness (Pre-procedure): Awake and Alert Pre-procedure Verification/Time Out Yes - 08:50 Taken: Start Time: 08:51 Pain Control: Lidocaine 4% T opical Solution T Area Debrided (L x W): otal 1 (cm) x 1.7 (cm) = 1.7 (cm) Tissue and other material debrided: Non-Viable, Slough, Skin: Epidermis, Slough Level: Skin/Epidermis Debridement Description: Selective/Open Wound Instrument: Curette Bleeding: Minimum Hemostasis Achieved: Pressure Procedural Pain: 1 Post Procedural Pain: 0 Response to Treatment: Procedure was tolerated well Level of Consciousness (Post- Awake and  Alert procedure): Post Debridement Measurements of Total Wound Length: (cm) 1 Width: (cm) 1.7 Depth: (cm) 0.1 Volume: (cm) 0.134 Character of Wound/Ulcer Post Debridement: Improved Post Procedure Diagnosis Same as Pre-procedure Electronic Signature(s) Signed: 12/26/2021 10:18:58 AM By: Fredirick Maudlin MD FACS Signed: 12/26/2021 6:21:16 PM By: Baruch Gouty RN, BSN Entered By: Baruch Gouty on 12/26/2021 08:53:10 -------------------------------------------------------------------------------- HPI Details Patient Name: Date of Service: Erika Councilman A. 12/26/2021 8:15 A M Medical Record Number: [id number] Patient Account Number: [id number] Date of Birth/Sex: Treating RN: 18-Apr-1961 (62 y.o. Elam Dutch Primary Care Provider: [name] Other Clinician: Referring Provider: Treating Provider/Extender: Mertha Finders in Treatment: 4 History of Present Illness HPI Description: ADMISSION 11/28/2021 This is a 61 year old woman with a past medical history significant for hypertension and breast cancer. She suffered a laceration to her left lower extremity when her dog apparently clawed her as he was jumping up to chase a squirrel. She treated this at home with peroxide and topical antibiotic ointment but it did not get better. She presented to the emergency department on 18 Nov 2021, about a week after the injury occurred. The ED provider told her to stop using the hydrogen peroxide and prescribed a 1 week course of Keflex. She returned to the emergency department on May 22 with concern that the wound was more red, more swollen, and more painful. The emergency room gave her a shot of Rocephin and initiated doxycycline. She was referred to wound care for further evaluation and management. On the anterior tibial surface of her right leg, there is a wound in the shape of an inverted V with a flap of nonviable skin loosely adherent to the surface.  There is eschar and old hematoma accumulated on the wound. The periwound skin is intact. There is no erythema, induration, odor, or discharge. 12/05/2021: The wound is very clean today. No slough accumulation. No drainage. The periwound skin is in good condition. 12/12/2021: The wound is smaller today. There is granulation tissue emerging. Just a small amount of slough. No  concern for infection. 12/26/2021: The wound continues to contract. There is just a little bit of slough on the wound surface, as well as some hanging nonviable skin. No concern for infection. Electronic Signature(s) Signed: 12/26/2021 9:14:49 AM By: Fredirick Maudlin MD FACS Entered By: Fredirick Maudlin on 12/26/2021 09:14:49 -------------------------------------------------------------------------------- Physical Exam Details Patient Name: Date of Service: Erika Councilman A. 12/26/2021 8:15 A M Medical Record Number: [id number] Patient Account Number: [id number] Date of Birth/Sex: Treating RN: 06-13-1961 (61 y.o. Elam Dutch Primary Care Provider: [name] Other Clinician: Referring Provider: Treating Provider/Extender: Mertha Finders in Treatment: 4 Constitutional . . . . No acute distress.Marland Kitchen Respiratory Normal work of breathing on room air.. Notes 12/26/2021: The wound continues to contract. There is just a little bit of slough on the wound surface, as well as some hanging nonviable skin. No concern for infection. Electronic Signature(s) Signed: 12/26/2021 9:15:35 AM By: Fredirick Maudlin MD FACS Entered By: Fredirick Maudlin on 12/26/2021 09:15:35 -------------------------------------------------------------------------------- Physician Orders Details Patient Name: Date of Service: Erika Councilman A. 12/26/2021 8:15 A M Medical Record Number: [id number] Patient Account Number: [id number] Date of Birth/Sex: Treating RN: 06-Mar-1961 (61 y.o. Elam Dutch Primary Care  Provider: [name] Other Clinician: Referring Provider: Treating Provider/Extender: Mertha Finders in Treatment: 4 Verbal / Phone Orders: No Diagnosis Coding ICD-10 Coding Code Description I10 Essential (primary) hypertension L97.822 Non-pressure chronic ulcer of other part of left lower leg with fat layer exposed Follow-up Appointments ppointment in 1 week. - Dr. Celine Ahr RM 1 with [name] Return A Wed 6/28 @ 0815 am Bathing/ Shower/ Hygiene May shower and wash wound with soap and water. - with dressing changes Wound Treatment Wound #1 - Lower Leg Wound Laterality: Left, Lateral Cleanser: Normal Saline (Generic) Every Other Day/30 Days Discharge Instructions: Cleanse the wound with Normal Saline prior to applying a clean dressing using gauze sponges, not tissue or cotton balls. Prim Dressing: Hydrofera Blue Classic Foam, 2x2 in (Generic) Every Other Day/30 Days ary Discharge Instructions: Moisten with saline prior to applying to wound bed Secondary Dressing: Bordered Gauze, 4x4 in (Generic) Every Other Day/30 Days Discharge Instructions: Apply over primary dressing as directed. Patient Medications llergies: amlodipine A Notifications Medication Indication Start End prior to debridement 12/26/2021 lidocaine DOSE topical 4 % cream - cream topical Electronic Signature(s) Signed: 12/26/2021 9:15:46 AM By: Fredirick Maudlin MD FACS Entered By: Fredirick Maudlin on 12/26/2021 09:15:45 -------------------------------------------------------------------------------- Problem List Details Patient Name: Date of Service: Erika Councilman A. 12/26/2021 8:15 A M Medical Record Number: [id number] Patient Account Number: [id number] Date of Birth/Sex: Treating RN: 05-07-1961 (61 y.o. Elam Dutch Primary Care Provider: [name] Other Clinician: Referring Provider: Treating Provider/Extender: Mertha Finders in Treatment:  4 Active Problems ICD-10 Encounter Code Description Active Date MDM Diagnosis I10 Essential (primary) hypertension 11/28/2021 No Yes L97.822 Non-pressure chronic ulcer of other part of left lower leg with fat layer exposed5/24/2023 No Yes Inactive Problems Resolved Problems Electronic Signature(s) Signed: 12/26/2021 9:05:07 AM By: Fredirick Maudlin MD FACS Entered By: Fredirick Maudlin on 12/26/2021 09:05:07 -------------------------------------------------------------------------------- Progress Note Details Patient Name: Date of Service: [place], [place] 12/26/2021 8:15 A M Medical Record Number: [id number] Patient Account Number: [id number] Date of Birth/Sex: Treating RN: [birth year] (61 y.o. Elam Dutch Primary Care Provider: [name] Other Clinician: Referring Provider: Treating Provider/Extender: Mertha Finders in Treatment: 4 Subjective Chief Complaint Information obtained from Patient Patient seen for complaints of Non-Healing Wound. History of  Present Illness (HPI) ADMISSION 11/28/2021 This is a 61 year old woman with a past medical history significant for hypertension and breast cancer. She suffered a laceration to her left lower extremity when her dog apparently clawed her as he was jumping up to chase a squirrel. She treated this at home with peroxide and topical antibiotic ointment but it did not get better. She presented to the emergency department on 18 Nov 2021, about a week after the injury occurred. The ED provider told her to stop using the hydrogen peroxide and prescribed a 1 week course of Keflex. She returned to the emergency department on May 22 with concern that the wound was more red, more swollen, and more painful. The emergency room gave her a shot of Rocephin and initiated doxycycline. She was referred to wound care for further evaluation and management. On the anterior tibial surface of her right leg, there is a  wound in the shape of an inverted V with a flap of nonviable skin loosely adherent to the surface. There is eschar and old hematoma accumulated on the wound. The periwound skin is intact. There is no erythema, induration, odor, or discharge. 12/05/2021: The wound is very clean today. No slough accumulation. No drainage. The periwound skin is in good condition. 12/12/2021: The wound is smaller today. There is granulation tissue emerging. Just a small amount of slough. No concern for infection. 12/26/2021: The wound continues to contract. There is just a little bit of slough on the wound surface, as well as some hanging nonviable skin. No concern for infection. Patient History Information obtained from Patient, Chart. Family History Cancer - Mother, Diabetes - Father, Heart Disease - Father, Hypertension - Father, No family history of Hereditary Spherocytosis, Kidney Disease, Lung Disease, Seizures, Stroke, Thyroid Problems, Tuberculosis. Social History Never smoker, Marital Status - Married, Alcohol Use - Rarely, Drug Use - No History, Caffeine Use - Moderate - soda. Medical History Eyes Denies history of Cataracts, Glaucoma, Optic Neuritis Ear/Nose/Mouth/Throat Denies history of Chronic sinus problems/congestion, Middle ear problems Cardiovascular Patient has history of Hypertension Endocrine Denies history of Type I Diabetes, Type II Diabetes Genitourinary Denies history of End Stage Renal Disease Integumentary (Skin) Denies history of History of Burn Musculoskeletal Denies history of Gout, Rheumatoid Arthritis, Osteoarthritis, Osteomyelitis Oncologic Patient has history of Received Radiation Denies history of Received Chemotherapy Psychiatric Denies history of Anorexia/bulimia, Confinement Anxiety Hospitalization/Surgery History - breast lumpectomy. - lumbar surgery. - left knee surgery. - wisdom tooth extraction. Medical A Surgical History  Notes nd Gastrointestinal GERD Musculoskeletal lumbar herniated disc, lumbar DDD Oncologic hx breast CA Objective Constitutional No acute distress.. Vitals Time Taken: 8:40 AM, Height: 63 in, Weight: 144 lbs, BMI: 25.5, Temperature: 97.86 F, Pulse: 86 bpm, Respiratory Rate: 17 breaths/min, Blood Pressure: 138/84 mmHg. Respiratory Normal work of breathing on room air.. General Notes: 12/26/2021: The wound continues to contract. There is just a little bit of slough on the wound surface, as well as some hanging nonviable skin. No concern for infection. Integumentary (Hair, Skin) Wound #1 status is Open. Original cause of wound was Trauma. The date acquired was: 11/07/2021. The wound has been in treatment 4 weeks. The wound is located on the Left,Lateral Lower Leg. The wound measures 1cm length x 1.7cm width x 0.1cm depth; 1.335cm^2 area and 0.134cm^3 volume. There is Fat Layer (Subcutaneous Tissue) exposed. There is no tunneling or undermining noted. There is a medium amount of serosanguineous drainage noted. The wound margin is flat and intact. There is medium (34-66%) red  granulation within the wound bed. There is a medium (34-66%) amount of necrotic tissue within the wound bed including Adherent Slough. Assessment Active Problems ICD-10 Essential (primary) hypertension Non-pressure chronic ulcer of other part of left lower leg with fat layer exposed Procedures Wound #1 Pre-procedure diagnosis of Wound #1 is a Trauma, Other located on the Left,Lateral Lower Leg . There was a Selective/Open Wound Skin/Epidermis Debridement with a total area of 1.7 sq cm performed by Fredirick Maudlin, MD. With the following instrument(s): Curette to remove Non-Viable tissue/material. Material removed includes [facility] and Skin: Epidermis and after achieving pain control using Lidocaine 4% T opical Solution. No specimens were taken. A time out was conducted at 08:50, prior to the start of the procedure. A  Minimum amount of bleeding was controlled with Pressure. The procedure was tolerated well with a pain level of 1 throughout and a pain level of 0 following the procedure. Post Debridement Measurements: 1cm length x 1.7cm width x 0.1cm depth; 0.134cm^3 volume. Character of Wound/Ulcer Post Debridement is improved. Post procedure Diagnosis Wound #1: Same as Pre-Procedure Plan Follow-up Appointments: Return Appointment in 1 week. - Dr. Celine Ahr RM 1 with Delice Lesch 6/28 @ 0815 am Bathing/ Shower/ Hygiene: May shower and wash wound with soap and water. - with dressing changes The following medication(s) was prescribed: lidocaine topical 4 % cream cream topical for prior to debridement was prescribed at facility WOUND #1: - Lower Leg Wound Laterality: Left, Lateral Cleanser: Normal Saline (Generic) Every Other Day/30 Days Discharge Instructions: Cleanse the wound with Normal Saline prior to applying a clean dressing using gauze sponges, not tissue or cotton balls. Prim Dressing: Hydrofera Blue Classic Foam, 2x2 in (Generic) Every Other Day/30 Days ary Discharge Instructions: Moisten with saline prior to applying to wound bed Secondary Dressing: Bordered Gauze, 4x4 in (Generic) Every Other Day/30 Days Discharge Instructions: Apply over primary dressing as directed. 12/26/2021: The wound continues to contract. There is just a little bit of slough on the wound surface, as well as some hanging nonviable skin. No concern for infection. I used a curette to debride the slough and biofilm from the wound surface as well as to remove the nonviable skin. We will continue using the [facility]. Follow-up in 1 week. Electronic Signature(s) Signed: 12/26/2021 9:16:12 AM By: Fredirick Maudlin MD FACS Entered By: Fredirick Maudlin on 12/26/2021 09:16:12 -------------------------------------------------------------------------------- HxROS Details Patient Name: Date of Service: Erika Councilman A. 12/26/2021  8:15 A M Medical Record Number: [id number] Patient Account Number: [id number] Date of Birth/Sex: Treating RN: [birth year] (61 y.o. Elam Dutch Primary Care Provider: [name] Other Clinician: Referring Provider: Treating Provider/Extender: Mertha Finders in Treatment: 4 Information Obtained From Patient Chart Eyes Medical History: Negative for: Cataracts; Glaucoma; Optic Neuritis Ear/Nose/Mouth/Throat Medical History: Negative for: Chronic sinus problems/congestion; Middle ear problems Cardiovascular Medical History: Positive for: Hypertension Gastrointestinal Medical History: Past Medical History Notes: GERD Endocrine Medical History: Negative for: Type I Diabetes; Type II Diabetes Genitourinary Medical History: Negative for: End Stage Renal Disease Integumentary (Skin) Medical History: Negative for: History of Burn Musculoskeletal Medical History: Negative for: Gout; Rheumatoid Arthritis; Osteoarthritis; Osteomyelitis Past Medical History Notes: lumbar herniated disc, lumbar DDD Oncologic Medical History: Positive for: Received Radiation Negative for: Received Chemotherapy Past Medical History Notes: hx breast CA Psychiatric Medical History: Negative for: Anorexia/bulimia; Confinement Anxiety Immunizations Pneumococcal Vaccine: Received Pneumococcal Vaccination: No Implantable Devices No devices added Hospitalization / Surgery History Type of Hospitalization/Surgery breast lumpectomy lumbar surgery left knee surgery wisdom tooth  extraction Family and Social History Cancer: Yes - Mother; Diabetes: Yes - Father; Heart Disease: Yes - Father; Hereditary Spherocytosis: No; Hypertension: Yes - Father; Kidney Disease: No; Lung Disease: No; Seizures: No; Stroke: No; Thyroid Problems: No; Tuberculosis: No; Never smoker; Marital Status - Married; Alcohol Use: Rarely; Drug Use: No History; Caffeine Use: Moderate - soda;  Financial Concerns: No; Food, Clothing or Shelter Needs: No; Support System Lacking: No; Transportation Concerns: No Electronic Signature(s) Signed: 12/26/2021 10:18:58 AM By: Fredirick Maudlin MD FACS Signed: 12/26/2021 6:21:16 PM By: Baruch Gouty RN, BSN Entered By: Fredirick Maudlin on 12/26/2021 09:15:13 -------------------------------------------------------------------------------- SuperBill Details Patient Name: Date of Service: Erika Councilman A. 12/26/2021 Medical Record Number: [id number] Patient Account Number: [id number] Date of Birth/Sex: Treating RN: 1960/12/05 (61 y.o. Elam Dutch Primary Care Provider: [name] Other Clinician: Referring Provider: Treating Provider/Extender: Mertha Finders in Treatment: 4 Diagnosis Coding ICD-10 Codes Code Description I10 Essential (primary) hypertension [id number] Non-pressure chronic ulcer of other part of left lower leg with fat layer exposed Facility Procedures CPT4 Code: [id number] Description: [phone] - DEBRIDE WOUND 1ST 20 SQ CM OR < ICD-10 Diagnosis Description L97.822 Non-pressure chronic ulcer of other part of left lower leg with fat layer expose Modifier: d Quantity: 1 Physician Procedures : CPT4 Code Description Modifier [id number] - WC PHYS LEVEL 3 - EST PT 25 ICD-10 Diagnosis Description L97.822 Non-pressure chronic ulcer of other part of left lower leg with fat layer exposed I10 Essential (primary) hypertension Quantity: 1 : [phone] [id number] - WC PHYS DEBR WO ANESTH 20 SQ CM ICD-10 Diagnosis Description L97.822 Non-pressure chronic ulcer of other part of left lower leg with fat layer exposed Quantity: 1 Electronic Signature(s) Signed: 12/26/2021 9:19:21 AM By: Fredirick Maudlin MD FACS Entered By: Fredirick Maudlin on 12/26/2021 09:19:21

## 2022-01-02 ENCOUNTER — Encounter: Payer: Self-pay | Admitting: Hematology and Oncology

## 2022-01-02 ENCOUNTER — Other Ambulatory Visit: Payer: Self-pay

## 2022-01-02 ENCOUNTER — Encounter (HOSPITAL_BASED_OUTPATIENT_CLINIC_OR_DEPARTMENT_OTHER): Payer: 59 | Admitting: General Surgery

## 2022-01-02 ENCOUNTER — Inpatient Hospital Stay: Payer: 59 | Attending: Hematology and Oncology | Admitting: Hematology and Oncology

## 2022-01-02 DIAGNOSIS — L97822 Non-pressure chronic ulcer of other part of left lower leg with fat layer exposed: Secondary | ICD-10-CM | POA: Diagnosis not present

## 2022-01-02 DIAGNOSIS — Z853 Personal history of malignant neoplasm of breast: Secondary | ICD-10-CM | POA: Diagnosis not present

## 2022-01-02 DIAGNOSIS — Z79811 Long term (current) use of aromatase inhibitors: Secondary | ICD-10-CM | POA: Insufficient documentation

## 2022-01-02 DIAGNOSIS — C50412 Malignant neoplasm of upper-outer quadrant of left female breast: Secondary | ICD-10-CM | POA: Diagnosis not present

## 2022-01-02 DIAGNOSIS — I10 Essential (primary) hypertension: Secondary | ICD-10-CM | POA: Diagnosis not present

## 2022-01-02 DIAGNOSIS — Z923 Personal history of irradiation: Secondary | ICD-10-CM | POA: Diagnosis not present

## 2022-01-02 DIAGNOSIS — Z170 Estrogen receptor positive status [ER+]: Secondary | ICD-10-CM | POA: Diagnosis not present

## 2022-01-02 DIAGNOSIS — K219 Gastro-esophageal reflux disease without esophagitis: Secondary | ICD-10-CM | POA: Diagnosis not present

## 2022-01-02 NOTE — Progress Notes (Signed)
[facility] NOTE  Patient Care Team: [name], MD as PCP - General (Family Medicine) Coralie Keens, MD as Consulting Physician (General Surgery) Mauro Kaufmann, RN as Oncology Nurse Navigator Rockwell Germany, RN as Oncology Nurse Navigator  CHIEF COMPLAINTS/PURPOSE OF CONSULTATION:  [place] left breast  ASSESSMENT & PLAN:  No problem-specific Assessment & Plan notes found for this encounter.   HISTORY OF PRESENTING ILLNESS:  Erika Harvey 61 y.o. female is here because of invasive lobular carcinoma of the left breast.  This is a 61 year old female patient seen by Dr. Ninfa Linden for evaluation of breast cancer.  She had screening mammography which showed a 6 mm mass in the upper outer quadrant of left breast.  Biopsy showed an invasive lobular carcinoma with LCIS, grade 2.  Pathology showed ER 100%, positive, strong staining intensity, PR, 40% positive strong staining intensity, Ki of 10% and negative for HER2 1+ by IHC.  She had a left breast lumpectomy which showed invasive lobular carcinoma, 0.7 cm, grade 3, DCIS, low-grade, negative resection margins, all sentinel lymph nodes negative for carcinoma.  Prognostics are not repeated. Final pathologic classification P T1b P N0  Oncotype score results are as 28, distant recurrence risk at 9 years with tamoxifen alone is 17% and chemotherapy benefit was deemed greater than 15%.During her last visit, we have discussed about added benefit of chemotherapy given her Oncotype score.  Given her age, small benefit and because she has talked to some friends who had bad experience with chemotherapy, understanding fully the benefit of added chemotherapy, she does not want to proceed with it.    She is here for follow-up on anastrozole.  She denies any complaints with anastrozole.  She had a bit of numbness and tingling in the left arm while she was undergoing radiation and this has healed.  She denies any new breast  changes except for some scar tissue in the left breast. Rest of the pertinent 10 point ROS reviewed and negative.  MEDICAL HISTORY:  Past Medical History:  Diagnosis Date   Allergy    Anxiety    Arthritis    Breast cancer ([place]) 07/19/2021   Chronic low back pain    Fibroid 03/30/2015   Frequent UTI    Hypertension    Vaginal bleeding 03/30/2015    SURGICAL HISTORY: Past Surgical History:  Procedure Laterality Date   BACK SURGERY  05-16-2015   BREAST LUMPECTOMY WITH RADIOACTIVE SEED AND SENTINEL LYMPH NODE BIOPSY Left 08/08/2021   Procedure: LEFT BREAST LUMPECTOMY WITH RADIOACTIVE SEED AND SENTINEL LYMPH NODE BIOPSY;  Surgeon: Coralie Keens, MD;  Location: [facility];  Service: General;  Laterality: Left;   KNEE SURGERY     left   KNEE SURGERY Left 07/08/2012   WISDOM TOOTH EXTRACTION      SOCIAL HISTORY: Social History   Socioeconomic History   Marital status: Married    Spouse name: Not on file   Number of children: Not on file   Years of education: Not on file   Highest education level: Not on file  Occupational History   Not on file  Tobacco Use   Smoking status: Never   Smokeless tobacco: Never  Vaping Use   Vaping Use: Never used  Substance and Sexual Activity   Alcohol use: Yes    Alcohol/week: 5.0 standard drinks of alcohol    Types: 5 Cans of beer per week   Drug use: No   Sexual activity: Yes  Birth control/protection: Pill  Other Topics Concern   Not on file  Social History Narrative   Not on file   Social Determinants of Health   Financial Resource Strain: Not on file  Food Insecurity: Not on file  Transportation Needs: Not on file  Physical Activity: Not on file  Stress: Not on file  Social Connections: Not on file  Intimate Partner Violence: Not on file    FAMILY HISTORY: Family History  Problem Relation Age of Onset   Breast cancer Mother    Heart disease Father 88   Diabetes Father    Colon cancer Neg Hx     Colon polyps Neg Hx     ALLERGIES:  is allergic to norvasc [amlodipine besylate].  MEDICATIONS:  Current Outpatient Medications  Medication Sig Dispense Refill   traMADol (ULTRAM) 50 MG tablet Take 1 tablet (50 mg total) by mouth every 6 (six) hours as needed. 120 tablet 0   anastrozole (ARIMIDEX) 1 MG tablet Take 1 tablet (1 mg total) by mouth daily. 30 tablet 3   atenolol (TENORMIN) 25 MG tablet Take 1 tablet (25 mg total) by mouth daily. 90 tablet 2   atorvastatin (LIPITOR) 40 MG tablet Take 1 tablet (40 mg total) by mouth daily. 90 tablet 1   calcium-vitamin D (OSCAL WITH D) 250-125 MG-UNIT per tablet Take 1 tablet by mouth daily.     cyclobenzaprine (FLEXERIL) 10 MG tablet TAKE ONE TABLET BY MOUTH THREE TIMES A DAY AS NEEDED FOR MUSCLE SPASMS 60 tablet 2   diazepam (VALIUM) 5 MG tablet TAKE ONE TABLET BY MOUTH TWICE A DAY AS NEEDED FOR ANXIETY 60 tablet 0   diclofenac Sodium (VOLTAREN) 1 % GEL Apply 2 g topically 4 (four) times daily. 100 g 5   DULoxetine (CYMBALTA) 60 MG capsule Take 1 capsule (60 mg total) by mouth daily. 90 capsule 3   estradiol (ESTRACE) 0.1 MG/GM vaginal cream estradiol 0.01% (0.1 mg/gram) vaginal cream  INSERT 1 ML VAGINALLY TWICE A WEEK     fish oil-omega-3 fatty acids 1000 MG capsule Take 2 g by mouth 2 (two) times daily.     fluticasone (FLONASE) 50 MCG/ACT nasal spray Place 2 sprays into both nostrils daily. 16 g 6   levocetirizine (XYZAL ALLERGY 24HR) 5 MG tablet Take 1 tablet (5 mg total) by mouth every evening. 30 tablet 0   losartan (COZAAR) 100 MG tablet TAKE ONE TABLET BY MOUTH DAILY 90 tablet 2   omeprazole (PRILOSEC) 20 MG capsule Take 1 capsule (20 mg total) by mouth daily. 90 capsule 3   zolpidem (AMBIEN) 10 MG tablet Take 1 tablet (10 mg total) by mouth at bedtime as needed for sleep. 30 tablet 1   No current facility-administered medications for this visit.    PHYSICAL EXAMINATION: ECOG PERFORMANCE STATUS: 0 - Asymptomatic  Vitals:    01/02/22 1318  BP: 99/62  Pulse: 88  Resp: 18  Temp: 97.8 F (36.6 C)  SpO2: 99%   Physical Exam Constitutional:      Appearance: Normal appearance.  Chest:     Comments: Bilateral breasts inspected and palpated.  Left breast with postsurgical changes.  No other palpable masses or regional adenopathy.  Right breast normal to inspection and palpation. Neurological:     Mental Status: She is alert.      LABORATORY DATA:  I have reviewed the data as listed Lab Results  Component Value Date   WBC 6.0 04/06/2021   HGB 11.8 04/06/2021  HCT 35.1 04/06/2021   MCV 98.0 04/06/2021   PLT 290 04/06/2021     Chemistry      Component Value Date/Time   NA 139 04/06/2021 1449   K 4.7 04/06/2021 1449   CL 103 04/06/2021 1449   CO2 26 04/06/2021 1449   BUN 21 04/06/2021 1449   CREATININE 0.89 04/06/2021 1449      Component Value Date/Time   CALCIUM 9.8 04/06/2021 1449   ALKPHOS 56 10/24/2015 0850   AST 55 (H) 04/06/2021 1449   ALT 78 (H) 04/06/2021 1449   BILITOT 0.3 04/06/2021 1449       RADIOGRAPHIC STUDIES: I have personally reviewed the radiological images as listed and agreed with the findings in the report. No results found.  I spent 20 minutes in the care of this patient including history, physical exam, review of records, counseling and coordination of care    Benay Pike, MD 01/02/2022 1:38 PM

## 2022-01-02 NOTE — Assessment & Plan Note (Signed)
This is a very pleasant 61 year old postmenopausal female patient with newly diagnosed invasive lobular carcinoma of the left breast, T1 N0 M0 grade, ER/PR positive, ER strong staining intensity 100%, PR 40% positive, strong staining intensity, HER2 negative and KI of 10% status post left lumpectomy.  Final pathology showed invasive lobular carcinoma, grade 3 measuring 0.7 cm along with low-grade DCIS, resection margins negative, all sentinel lymph nodes negative for carcinoma. Oncotype testing was sent which showed recurrence score of 28, distant recurrence risk at 9 years with tamoxifen alone is 17% and chemotherapy benefit was greater than 15%.   During her visit past week, we have discussed about role of adjuvant chemotherapy given the Oncotype score, we have discussed about TC regimen, schedule, adverse effects and benefits.  She declined adjuvant chemotherapy.  She is taking anastrozole as prescribed.  No adverse effects reported. No concerns on exam today.  I have ordered her mammogram to be done in December 2023.  She is also due for a bone density, gave her the phone number to call and schedule, this was ordered during her previous visit.  Advised calcium/vitamin D supplementation and weightbearing exercises for bone health.

## 2022-01-04 NOTE — Progress Notes (Signed)
SHYRL, OBI ([id number]) Visit Report for 01/02/2022 Chief Complaint Document Details Patient Name: Date of Service: Erika Harvey, Erika Harvey 01/02/2022 8:15 A M Medical Record Number: [id number] Patient Account Number: [id number] Date of Birth/Sex: Treating RN: [birth year] (61 y.o. Elam Dutch Primary Care Provider: [name] Other Clinician: Referring Provider: Treating Provider/Extender: Mertha Finders in Treatment: 5 Information Obtained from: Patient Chief Complaint Patient seen for complaints of Non-Healing Wound. Electronic Signature(s) Signed: 01/02/2022 9:02:13 AM By: Fredirick Maudlin MD FACS Entered By: Fredirick Maudlin on 01/02/2022 09:02:13 -------------------------------------------------------------------------------- HPI Details Patient Name: Date of Service: Erika Councilman A. 01/02/2022 8:15 A M Medical Record Number: [id number] Patient Account Number: [id number] Date of Birth/Sex: Treating RN: [birth year] (61 y.o. Elam Dutch Primary Care Provider: [name] Other Clinician: Referring Provider: Treating Provider/Extender: Mertha Finders in Treatment: 5 History of Present Illness HPI Description: ADMISSION 11/28/2021 This is a 61 year old woman with a past medical history significant for hypertension and breast cancer. She suffered a laceration to her left lower extremity when her dog apparently clawed her as he was jumping up to chase a squirrel. She treated this at home with peroxide and topical antibiotic ointment but it did not get better. She presented to the emergency department on 18 Nov 2021, about a week after the injury occurred. The ED provider told her to stop using the hydrogen peroxide and prescribed a 1 week course of Keflex. She returned to the emergency department on May 22 with concern that the wound was more red, more swollen, and more painful. The emergency room gave her a  shot of Rocephin and initiated doxycycline. She was referred to wound care for further evaluation and management. On the anterior tibial surface of her right leg, there is a wound in the shape of an inverted V with a flap of nonviable skin loosely adherent to the surface. There is eschar and old hematoma accumulated on the wound. The periwound skin is intact. There is no erythema, induration, odor, or discharge. 12/05/2021: The wound is very clean today. No slough accumulation. No drainage. The periwound skin is in good condition. 12/12/2021: The wound is smaller today. There is granulation tissue emerging. Just a small amount of slough. No concern for infection. 12/26/2021: The wound continues to contract. There is just a little bit of slough on the wound surface, as well as some hanging nonviable skin. No concern for infection. 01/02/2022: The wound is much smaller today. There is no slough accumulation. Periwound skin is intact. Electronic Signature(s) Signed: 01/02/2022 9:02:41 AM By: Fredirick Maudlin MD FACS Entered By: Fredirick Maudlin on 01/02/2022 09:02:41 -------------------------------------------------------------------------------- Physical Exam Details Patient Name: Date of Service: Erika Councilman A. 01/02/2022 8:15 A M Medical Record Number: [id number] Patient Account Number: [id number] Date of Birth/Sex: Treating RN: April 06, 1961 (61 y.o. Elam Dutch Primary Care Provider: [name] Other Clinician: Referring Provider: Treating Provider/Extender: Mertha Finders in Treatment: 5 Constitutional . . . . No acute distress.Marland Kitchen Respiratory Normal work of breathing on room air.. Notes 01/02/2022: The wound is much smaller today. There is no slough accumulation. Periwound skin is intact. Electronic Signature(s) Signed: 01/02/2022 9:03:46 AM By: Fredirick Maudlin MD FACS Entered By: Fredirick Maudlin on 01/02/2022  09:03:46 -------------------------------------------------------------------------------- Physician Orders Details Patient Name: Date of Service: Erika Councilman A. 01/02/2022 8:15 A M Medical Record Number: [id number] Patient Account Number: [id number] Date of Birth/Sex: Treating RN: 1960-12-22 (61 y.o. [name] Primary Care Provider: [name]  Other Clinician: Referring Provider: Treating Provider/Extender: Mertha Finders in Treatment: 5 Verbal / Phone Orders: No Diagnosis Coding ICD-10 Coding Code Description I10 Essential (primary) hypertension L97.822 Non-pressure chronic ulcer of other part of left lower leg with fat layer exposed Follow-up Appointments ppointment in 1 week. - Dr. Celine Ahr - room 3 - 7/5 at 10:15 AM Return A Bathing/ Shower/ Hygiene May shower and wash wound with soap and water. - with dressing changes Wound Treatment Wound #1 - Lower Leg Wound Laterality: Left, Lateral Cleanser: Normal Saline (Generic) Every Other Day/30 Days Discharge Instructions: Cleanse the wound with Normal Saline prior to applying a clean dressing using gauze sponges, not tissue or cotton balls. Prim Dressing: Hydrofera Blue Classic Foam, 2x2 in (Generic) Every Other Day/30 Days ary Discharge Instructions: Moisten with saline prior to applying to wound bed Secondary Dressing: Bordered Gauze, 4x4 in (Generic) Every Other Day/30 Days Discharge Instructions: Apply over primary dressing as directed. Electronic Signature(s) Signed: 01/02/2022 9:04:24 AM By: Fredirick Maudlin MD FACS Entered By: Fredirick Maudlin on 01/02/2022 09:04:23 -------------------------------------------------------------------------------- Problem List Details Patient Name: Date of Service: Erika Councilman A. 01/02/2022 8:15 A M Medical Record Number: [id number] Patient Account Number: [id number] Date of Birth/Sex: Treating RN: [birth year] (61 y.o. Elam Dutch Primary Care Provider: [name] Other Clinician: Referring Provider: Treating Provider/Extender: Mertha Finders in Treatment: 5 Active Problems ICD-10 Encounter Code Description Active Date MDM Diagnosis I10 Essential (primary) hypertension 11/28/2021 No Yes L97.822 Non-pressure chronic ulcer of other part of left lower leg with fat layer exposed5/24/2023 No Yes Inactive Problems Resolved Problems Electronic Signature(s) Signed: 01/02/2022 9:02:03 AM By: Fredirick Maudlin MD FACS Entered By: Fredirick Maudlin on 01/02/2022 09:02:03 -------------------------------------------------------------------------------- Progress Note Details Patient Name: Date of Service: Erika Councilman A. 01/02/2022 8:15 A M Medical Record Number: [id number] Patient Account Number: [id number] Date of Birth/Sex: Treating RN: [birth year] (61 y.o. Elam Dutch Primary Care Provider: [name] Other Clinician: Referring Provider: Treating Provider/Extender: Mertha Finders in Treatment: 5 Subjective Chief Complaint Information obtained from Patient Patient seen for complaints of Non-Healing Wound. History of Present Illness (HPI) ADMISSION 11/28/2021 This is a 61 year old woman with a past medical history significant for hypertension and breast cancer. She suffered a laceration to her left lower extremity when her dog apparently clawed her as he was jumping up to chase a squirrel. She treated this at home with peroxide and topical antibiotic ointment but it did not get better. She presented to the emergency department on 18 Nov 2021, about a week after the injury occurred. The ED provider told her to stop using the hydrogen peroxide and prescribed a 1 week course of Keflex. She returned to the emergency department on May 22 with concern that the wound was more red, more swollen, and more painful. The emergency room gave her a shot of  Rocephin and initiated doxycycline. She was referred to wound care for further evaluation and management. On the anterior tibial surface of her right leg, there is a wound in the shape of an inverted V with a flap of nonviable skin loosely adherent to the surface. There is eschar and old hematoma accumulated on the wound. The periwound skin is intact. There is no erythema, induration, odor, or discharge. 12/05/2021: The wound is very clean today. No slough accumulation. No drainage. The periwound skin is in good condition. 12/12/2021: The wound is smaller today. There is granulation tissue emerging. Just a small amount of slough. No  concern for infection. 12/26/2021: The wound continues to contract. There is just a little bit of slough on the wound surface, as well as some hanging nonviable skin. No concern for infection. 01/02/2022: The wound is much smaller today. There is no slough accumulation. Periwound skin is intact. Patient History Information obtained from Patient, Chart. Family History Cancer - Mother, Diabetes - Father, Heart Disease - Father, Hypertension - Father, No family history of Hereditary Spherocytosis, Kidney Disease, Lung Disease, Seizures, Stroke, Thyroid Problems, Tuberculosis. Social History Never smoker, Marital Status - Married, Alcohol Use - Rarely, Drug Use - No History, Caffeine Use - Moderate - soda. Medical History Eyes Denies history of Cataracts, Glaucoma, Optic Neuritis Ear/Nose/Mouth/Throat Denies history of Chronic sinus problems/congestion, Middle ear problems Cardiovascular Patient has history of Hypertension Endocrine Denies history of Type I Diabetes, Type II Diabetes Genitourinary Denies history of End Stage Renal Disease Integumentary (Skin) Denies history of History of Burn Musculoskeletal Denies history of Gout, Rheumatoid Arthritis, Osteoarthritis, Osteomyelitis Oncologic Patient has history of Received Radiation Denies history of Received  Chemotherapy Psychiatric Denies history of Anorexia/bulimia, Confinement Anxiety Hospitalization/Surgery History - breast lumpectomy. - lumbar surgery. - left knee surgery. - wisdom tooth extraction. Medical A Surgical History Notes nd Gastrointestinal GERD Musculoskeletal lumbar herniated disc, lumbar DDD Oncologic hx breast CA Objective Constitutional No acute distress.. Vitals Time Taken: 8:30 AM, Height: 63 in, Weight: 144 lbs, BMI: 25.5, Temperature: 98.5 F, Pulse: 87 bpm, Respiratory Rate: 18 breaths/min, Blood Pressure: 106/73 mmHg. Respiratory Normal work of breathing on room air.. General Notes: 01/02/2022: The wound is much smaller today. There is no slough accumulation. Periwound skin is intact. Integumentary (Hair, Skin) Wound #1 status is Open. Original cause of wound was Trauma. The date acquired was: 11/07/2021. The wound has been in treatment 5 weeks. The wound is located on the Left,Lateral Lower Leg. The wound measures 0.7cm length x 0.9cm width x 0.1cm depth; 0.495cm^2 area and 0.049cm^3 volume. There is Fat Layer (Subcutaneous Tissue) exposed. There is no tunneling or undermining noted. There is a medium amount of serosanguineous drainage noted. The wound margin is flat and intact. There is large (67-100%) red granulation within the wound bed. There is no necrotic tissue within the wound bed. Assessment Active Problems ICD-10 Essential (primary) hypertension Non-pressure chronic ulcer of other part of left lower leg with fat layer exposed Plan Follow-up Appointments: Return Appointment in 1 week. - Dr. Celine Ahr - room 3 - 7/5 at 10:15 AM Bathing/ Shower/ Hygiene: May shower and wash wound with soap and water. - with dressing changes WOUND #1: - Lower Leg Wound Laterality: Left, Lateral Cleanser: Normal Saline (Generic) Every Other Day/30 Days Discharge Instructions: Cleanse the wound with Normal Saline prior to applying a clean dressing using gauze sponges, not  tissue or cotton balls. Prim Dressing: Hydrofera Blue Classic Foam, 2x2 in (Generic) Every Other Day/30 Days ary Discharge Instructions: Moisten with saline prior to applying to wound bed Secondary Dressing: Bordered Gauze, 4x4 in (Generic) Every Other Day/30 Days Discharge Instructions: Apply over primary dressing as directed. 01/02/2022: The wound is much smaller today. There is no slough accumulation. Periwound skin is intact. No debridement was required today. Continue Hydrofera Blue. Follow-up in 1 week. Electronic Signature(s) Signed: 01/02/2022 9:04:44 AM By: Fredirick Maudlin MD FACS Entered By: Fredirick Maudlin on 01/02/2022 09:04:43 -------------------------------------------------------------------------------- HxROS Details Patient Name: Date of Service: Erika Councilman A. 01/02/2022 8:15 A M Medical Record Number: [id number] Patient Account Number: [id number] Date of Birth/Sex: Treating RN: October 07, 1960 (61 y.o.  Elam Dutch Primary Care Provider: [name] Other Clinician: Referring Provider: Treating Provider/Extender: Mertha Finders in Treatment: 5 Information Obtained From Patient Chart Eyes Medical History: Negative for: Cataracts; Glaucoma; Optic Neuritis Ear/Nose/Mouth/Throat Medical History: Negative for: Chronic sinus problems/congestion; Middle ear problems Cardiovascular Medical History: Positive for: Hypertension Gastrointestinal Medical History: Past Medical History Notes: GERD Endocrine Medical History: Negative for: Type I Diabetes; Type II Diabetes Genitourinary Medical History: Negative for: End Stage Renal Disease Integumentary (Skin) Medical History: Negative for: History of Burn Musculoskeletal Medical History: Negative for: Gout; Rheumatoid Arthritis; Osteoarthritis; Osteomyelitis Past Medical History Notes: lumbar herniated disc, lumbar DDD Oncologic Medical History: Positive for: Received  Radiation Negative for: Received Chemotherapy Past Medical History Notes: hx breast CA Psychiatric Medical History: Negative for: Anorexia/bulimia; Confinement Anxiety Immunizations Pneumococcal Vaccine: Received Pneumococcal Vaccination: No Implantable Devices No devices added Hospitalization / Surgery History Type of Hospitalization/Surgery breast lumpectomy lumbar surgery left knee surgery wisdom tooth extraction Family and Social History Cancer: Yes - Mother; Diabetes: Yes - Father; Heart Disease: Yes - Father; Hereditary Spherocytosis: No; Hypertension: Yes - Father; Kidney Disease: No; Lung Disease: No; Seizures: No; Stroke: No; Thyroid Problems: No; Tuberculosis: No; Never smoker; Marital Status - Married; Alcohol Use: Rarely; Drug Use: No History; Caffeine Use: Moderate - soda; Financial Concerns: No; Food, Clothing or Shelter Needs: No; Support System Lacking: No; Transportation Concerns: No Electronic Signature(s) Signed: 01/02/2022 9:13:10 AM By: Fredirick Maudlin MD FACS Signed: 01/02/2022 5:16:00 PM By: Baruch Gouty RN, BSN Entered By: Fredirick Maudlin on 01/02/2022 09:02:47 -------------------------------------------------------------------------------- SuperBill Details Patient Name: Date of Service: Erika Councilman A. 01/02/2022 Medical Record Number: [id number] Patient Account Number: [id number] Date of Birth/Sex: Treating RN: 22-Oct-1960 (61 y.o. Elam Dutch Primary Care Provider: [name] Other Clinician: Referring Provider: Treating Provider/Extender: Mertha Finders in Treatment: 5 Diagnosis Coding ICD-10 Codes Code Description I10 Essential (primary) hypertension [phone] Non-pressure chronic ulcer of other part of left lower leg with fat layer exposed Facility Procedures CPT4 Code: [id number] Description: [place] VISIT-LEV 3 EST PT Modifier: Quantity: 1 Physician Procedures : CPT4 Code Description  Modifier [id number] - WC PHYS LEVEL 3 - EST PT ICD-10 Diagnosis Description L97.822 Non-pressure chronic ulcer of other part of left lower leg with fat layer exposed I10 Essential (primary) hypertension Quantity: 1 Electronic Signature(s) Signed: 01/04/2022 7:42:03 AM By: Fredirick Maudlin MD FACS Signed: 01/04/2022 5:03:19 PM By: Adline Peals Previous Signature: 01/02/2022 9:04:55 AM Version By: Fredirick Maudlin MD FACS Entered By: Adline Peals on 01/04/2022 07:37:45

## 2022-01-04 NOTE — Progress Notes (Signed)
Erika Harvey, Erika Harvey ([id number]) Visit Report for 01/02/2022 Arrival Information Details Patient Name: Date of Service: Erika Harvey, Erika Harvey 01/02/2022 8:15 A M Medical Record Number: [id number] Patient Account Number: [id number] Date of Birth/Sex: Treating RN: Erika Harvey (61 y.o. Erika Harvey Visit Information History Since Last Visit Added or deleted any medications: No Patient Arrived: Ambulatory Any new allergies or adverse reactions: No Arrival Time: 08:27 Had a fall or experienced change in No Accompanied By: spouse activities of daily living that may affect Transfer Assistance: None risk of falls: Patient Identification Verified: Yes Signs or symptoms of abuse/neglect since last visito No Secondary Verification Process Completed: Yes Hospitalized since last visit: No Patient Requires Transmission-Based Precautions: No Implantable device outside of the clinic excluding No Patient Has Alerts: No cellular tissue based products placed in the center since last visit: Has Dressing in Place as Prescribed: Yes Has Compression in Place as Prescribed: Yes Pain Present Now: No Electronic Signature(s) Signed: 01/02/2022 Harvey:01:07 PM By: Adline Peals Entered By: Adline Peals on 01/02/2022 08:28:55 -------------------------------------------------------------------------------- Clinic Level of Care Assessment Details Patient Name: Date of Service: Erika Harvey, Erika Harvey 01/02/2022 8:15 A M Medical Record Number: [id number] Patient Account Number: [id number] Date of Birth/Sex: Treating RN: [birth year] (61 y.o. Erika Harvey Primary Care Daily Doe: Erika Harvey Other Clinician: Referring Erika Harvey: Treating Erika Harvey/Extender: Erika Harvey in Treatment: Harvey Clinic Level of Care  Assessment Items TOOL 4 Quantity Score X- 1 0 Use when only an EandM is performed on FOLLOW-UP visit ASSESSMENTS - Nursing Assessment / Reassessment X- 1 10 Reassessment of Co-morbidities (includes updates in patient status) X- 1 Harvey Reassessment of Adherence to Treatment Plan ASSESSMENTS - Wound and Skin A ssessment / Reassessment X - Simple Wound Assessment / Reassessment - one wound 1 Harvey '[]'  - 0 Complex Wound Assessment / Reassessment - multiple wounds '[]'  - 0 Dermatologic / Skin Assessment (not related to wound area) ASSESSMENTS - Focused Assessment X- 1 Harvey Circumferential Edema Measurements - multi extremities '[]'  - 0 Nutritional Assessment / Counseling / Intervention X- 1 Harvey Lower Extremity Assessment (monofilament, tuning fork, pulses) '[]'  - 0 Peripheral Arterial Disease Assessment (using hand held doppler) ASSESSMENTS - Ostomy and/or Continence Assessment and Care '[]'  - 0 Incontinence Assessment and Management '[]'  - 0 Ostomy Care Assessment and Management (repouching, etc.) PROCESS - Coordination of Care X - Simple Patient / Family Education for ongoing care 1 15 '[]'  - 0 Complex (extensive) Patient / Family Education for ongoing care X- 1 10 Staff obtains Programmer, systems, Records, T Results / Process Orders est '[]'  - 0 Staff telephones HHA, Nursing Homes / Clarify orders / etc '[]'  - 0 Routine Transfer to another Facility (non-emergent condition) '[]'  - 0 Routine Hospital Admission (non-emergent condition) '[]'  - 0 New Admissions / Biomedical engineer / Ordering NPWT Apligraf, etc. , '[]'  - 0 Emergency Hospital Admission (emergent condition) X- 1 10 Simple Discharge Coordination '[]'  - 0 Complex (extensive) Discharge Coordination PROCESS - Special Needs '[]'  - 0 Pediatric / Minor Patient Management '[]'  - 0 Isolation Patient Management '[]'  - 0 Hearing / Language / Visual special needs '[]'  - 0 Assessment of Community assistance (transportation, D/C planning, etc.) '[]'  -  0 Additional assistance / Altered mentation '[]'  - 0 Support Surface(s) Assessment (bed, cushion, seat, etc.) INTERVENTIONS - Wound Cleansing / Measurement X - Simple Wound Cleansing - one wound 1 Harvey '[]'  -  0 Complex Wound Cleansing - multiple wounds X- 1 Harvey Wound Imaging (photographs - any number of wounds) '[]'  - 0 Wound Tracing (instead of photographs) X- 1 Harvey Simple Wound Measurement - one wound '[]'  - 0 Complex Wound Measurement - multiple wounds INTERVENTIONS - Wound Dressings X - Small Wound Dressing one or multiple wounds 1 10 '[]'  - 0 Medium Wound Dressing one or multiple wounds '[]'  - 0 Large Wound Dressing one or multiple wounds '[]'  - 0 Application of Medications - topical '[]'  - 0 Application of Medications - injection INTERVENTIONS - Miscellaneous '[]'  - 0 External ear exam '[]'  - 0 Specimen Collection (cultures, biopsies, blood, body fluids, etc.) '[]'  - 0 Specimen(s) / Culture(s) sent or taken to Lab for analysis '[]'  - 0 Patient Transfer (multiple staff / Civil Service fast streamer / Similar devices) '[]'  - 0 Simple Staple / Suture removal (25 or less) '[]'  - 0 Complex Staple / Suture removal (26 or more) '[]'  - 0 Hypo / Hyperglycemic Management (close monitor of Blood Glucose) '[]'  - 0 Ankle / Brachial Index (ABI) - do not check if billed separately X- 1 Harvey Vital Signs Has the patient been seen at the hospital within the last three years: Yes Total Score: 95 Level Of Care: New/Established - Level 3 Electronic Signature(s) Signed: 01/04/2022 Harvey:03:19 PM By: Adline Peals Entered By: Adline Peals on 01/04/2022 07:37:39 -------------------------------------------------------------------------------- Encounter Discharge Information Details Patient Name: Date of Service: Erika Councilman A. 01/02/2022 8:15 A M Medical Record Number: [id number] Patient Account Number: [id number] Date of Birth/Sex: Treating RN: Erika Harvey (61 y.o. Erika Harvey Primary Care Erika Harvey: Erika Harvey  Other Clinician: Referring Erika Harvey: Treating Erika Harvey/Extender: Erika Harvey in Treatment: Harvey Encounter Discharge Information Items Discharge Condition: Stable Ambulatory Status: Ambulatory Discharge Destination: Home Transportation: Private Auto Accompanied By: spouse Schedule Follow-up Appointment: Yes Clinical Summary of Care: Patient Declined Electronic Signature(s) Signed: 01/02/2022 Harvey:01:07 PM By: Adline Peals Entered By: Adline Peals on 01/02/2022 10:27:51 -------------------------------------------------------------------------------- Lower Extremity Assessment Details Patient Name: Date of Service: Erika Harvey, Erika Harvey 01/02/2022 8:15 A M Medical Record Number: [id number] Patient Account Number: [id number] Date of Birth/Sex: Treating RN: Harvey[birth year] (61 y.o. Erika Harvey Primary Care Kenya Shiraishi: Erika Harvey Other Clinician: Referring Azael Ragain: Treating Donte Lenzo/Extender: Erika Harvey in Treatment: Harvey Edema Assessment Assessed: [Left: No] [Right: No] Edema: [Left: N] [Right: o] Calf Left: Right: Point of Measurement: From Medial Instep 34 cm Ankle Left: Right: Point of Measurement: From Medial Instep 21.8 cm Vascular Assessment Pulses: Dorsalis Pedis Palpable: [Left:Yes] Electronic Signature(s) Signed: 01/02/2022 Harvey:01:07 PM By: Adline Peals Entered By: Adline Peals on 01/02/2022 08:31:27 -------------------------------------------------------------------------------- Multi Wound Chart Details Patient Name: Date of Service: Erika Councilman A. 01/02/2022 8:15 A M Medical Record Number: [id number] Patient Account Number: [id number] Date of Birth/Sex: Treating RN: [birth year] (61 y.o. Elam Dutch Primary Care Jeremiah Tarpley: Erika Harvey Other Clinician: Referring Yerick Eggebrecht: Treating Keith Felten/Extender: Erika Harvey in Treatment: Harvey Vital  Signs Height(in): 58 Pulse(bpm): 69 Weight(lbs): 144 Blood Pressure(mmHg): 106/73 Body Mass Index(BMI): 25.Harvey Temperature(F): 98.Harvey Respiratory Rate(breaths/min): 18 Photos: [N/A:N/A] Left, Lateral Lower Leg N/A N/A Wound Location: Trauma N/A N/A Wounding Event: Trauma, Other N/A N/A Primary Etiology: Hypertension, Received Radiation N/A N/A Comorbid History: Harvey/09/2021 N/A N/A Date Acquired: Harvey N/A N/A Weeks of Treatment: Open N/A N/A Wound Status: No N/A N/A Wound Recurrence: 0.7x0.9x0.1 N/A N/A Measurements L x W x D (cm) 0.495 N/A N/A A (cm) : rea 0.049 N/A N/A Volume (cm) : 93.40%  N/A N/A % Reduction in A rea: 96.70% N/A N/A % Reduction in Volume: Full Thickness Without Exposed N/A N/A Classification: Support Structures Medium N/A N/A Exudate Amount: Serosanguineous N/A N/A Exudate Type: red, brown N/A N/A Exudate Color: Flat and Intact N/A N/A Wound Margin: Large (67-100%) N/A N/A Granulation Amount: Red N/A N/A Granulation Quality: None Present (0%) N/A N/A Necrotic Amount: Fat Layer (Subcutaneous Tissue): Yes N/A N/A Exposed Structures: Fascia: No Tendon: No Muscle: No Joint: No Bone: No Small (1-33%) N/A N/A Epithelialization: Treatment Notes Electronic Signature(s) Signed: 01/02/2022 9:02:09 AM By: Fredirick Maudlin MD FACS Signed: 01/02/2022 Harvey:16:00 PM By: Baruch Gouty RN, BSN Entered By: Fredirick Maudlin on 01/02/2022 09:02:08 -------------------------------------------------------------------------------- Multi-Disciplinary Care Plan Details Patient Name: Date of Service: Erika Councilman A. 01/02/2022 8:15 A M Medical Record Number: [id number] Patient Account Number: [id number] Date of Birth/Sex: Treating RN: [birth year] (61 y.o. Erika Harvey Primary Care Sanaia Jasso: Erika Harvey Other Clinician: Referring Kyliyah Stirn: Treating Kamaron Deskins/Extender: Erika Harvey in Treatment: Harvey Multidisciplinary  Care Plan reviewed with physician Active Inactive Nutrition Nursing Diagnoses: Imbalanced nutrition Potential for alteratiion in Nutrition/Potential for imbalanced nutrition Goals: Patient/caregiver agrees to and verbalizes understanding of need to use nutritional supplements and/or vitamins as prescribed Date Initiated: Harvey/24/2023 Target Resolution Date: 01/23/2022 Goal Status: Active Interventions: Assess patient nutrition upon admission and as needed per policy Treatment Activities: Patient referred to Primary Care Physician for further nutritional evaluation : Harvey/24/2023 Notes: Wound/Skin Impairment Nursing Diagnoses: Impaired tissue integrity Knowledge deficit related to ulceration/compromised skin integrity Goals: Patient/caregiver will verbalize understanding of skin care regimen Date Initiated: Harvey/24/2023 Target Resolution Date: 01/23/2022 Goal Status: Active Ulcer/skin breakdown will have a volume reduction of 30% by week 4 Date Initiated: Harvey/24/2023 Date Inactivated: 12/26/2021 Target Resolution Date: 12/26/2021 Goal Status: Met Ulcer/skin breakdown will have a volume reduction of 50% by week 8 Date Initiated: 12/26/2021 Target Resolution Date: 01/23/2022 Goal Status: Active Interventions: Assess patient/caregiver ability to obtain necessary supplies Assess patient/caregiver ability to perform ulcer/skin care regimen upon admission and as needed Assess ulceration(s) every visit Provide education on ulcer and skin care Treatment Activities: Skin care regimen initiated : Harvey/24/2023 Topical wound management initiated : Harvey/24/2023 Notes: Electronic Signature(s) Signed: 01/02/2022 Harvey:01:07 PM By: Adline Peals Entered By: Adline Peals on 01/02/2022 08:37:04 -------------------------------------------------------------------------------- Pain Assessment Details Patient Name: Date of Service: Erika Councilman A. 01/02/2022 8:15 A M Medical Record Number:  [id number] Patient Account Number: [id number] Date of Birth/Sex: Treating RN: Harvey[birth year] (61 y.o. Erika Harvey Primary Care Deidre Carino: Erika Harvey Other Clinician: Referring Ilse Billman: Treating Ayriana Wix/Extender: Erika Harvey in Treatment: Harvey Active Problems Location of Pain Severity and Description of Pain Patient Has Paino No Site Locations Rate the pain. Current Pain Level: 0 Pain Management and Medication Current Pain Management: Electronic Signature(s) Signed: 01/02/2022 Harvey:01:07 PM By: Adline Peals Entered By: Adline Peals on 01/02/2022 08:29:05 -------------------------------------------------------------------------------- Patient/Caregiver Education Details Patient Name: Date of Service: Erika Harvey 6/28/2023andnbsp8:15 A M Medical Record Number: [id number] Patient Account Number: [id number] Date of Birth/Gender: Treating RN: Harvey[birth year] (61 y.o. Erika Harvey Primary Care Physician: Erika Harvey Other Clinician: Referring Physician: Treating Physician/Extender: Erika Harvey in Treatment: Harvey Education Assessment Education Provided To: Patient Education Topics Provided Wound/Skin Impairment: Methods: Explain/Verbal Responses: Reinforcements needed, State content correctly Electronic Signature(s) Signed: 01/02/2022 Harvey:01:07 PM By: Adline Peals Entered By: Adline Peals on 01/02/2022 08:37:14 -------------------------------------------------------------------------------- Wound Assessment Details Patient Name: Date of Service: Erika Councilman A. 01/02/2022 8:15 A M Medical  Record Number: [id number] Patient Account Number: [id number] Date of Birth/Sex: Treating RN: [birth year] (61 y.o. Erika Harvey Primary Care Annaya Bangert: Erika Harvey Other Clinician: Referring Doron Shake: Treating Arriyah Madej/Extender: Erika Harvey in  Treatment: Harvey Wound Status Wound Number: 1 Primary Etiology: Trauma, Other Wound Location: Left, Lateral Lower Leg Wound Status: Open Wounding Event: Trauma Comorbid History: Hypertension, Received Radiation Date Acquired: Harvey/09/2021 Weeks Of Treatment: Harvey Clustered Wound: No Photos Wound Measurements Length: (cm) 0.7 Width: (cm) 0.9 Depth: (cm) 0.1 Area: (cm) 0.495 Volume: (cm) 0.049 % Reduction in Area: 93.4% % Reduction in Volume: 96.7% Epithelialization: Small (1-33%) Tunneling: No Undermining: No Wound Description Classification: Full Thickness Without Exposed Support Structures Wound Margin: Flat and Intact Exudate Amount: Medium Exudate Type: Serosanguineous Exudate Color: red, brown Foul Odor After Cleansing: No Slough/Fibrino No Wound Bed Granulation Amount: Large (67-100%) Exposed Structure Granulation Quality: Red Fascia Exposed: No Necrotic Amount: None Present (0%) Fat Layer (Subcutaneous Tissue) Exposed: Yes Tendon Exposed: No Muscle Exposed: No Joint Exposed: No Bone Exposed: No Treatment Notes Wound #1 (Lower Leg) Wound Laterality: Left, Lateral Cleanser Normal Saline Discharge Instruction: Cleanse the wound with Normal Saline prior to applying a clean dressing using gauze sponges, not tissue or cotton balls. Peri-Wound Care Topical Primary Dressing Hydrofera Blue Classic Foam, 2x2 in Discharge Instruction: Moisten with saline prior to applying to wound bed Secondary Dressing Bordered Gauze, 4x4 in Discharge Instruction: Apply over primary dressing as directed. Secured With Compression Wrap Compression Stockings Environmental education officer) Signed: 01/02/2022 Harvey:01:07 PM By: Adline Peals Entered By: Adline Peals on 01/02/2022 08:38:44 -------------------------------------------------------------------------------- Vitals Details Patient Name: Date of Service: Erika Councilman A. 01/02/2022 8:15 A M Medical Record Number:  [id number] Patient Account Number: [id number] Date of Birth/Sex: Treating RN: Harvey[birth year] (61 y.o. Erika Harvey Primary Care Adelie Croswell: Erika Harvey Other Clinician: Referring Elloise Roark: Treating Bettye Sitton/Extender: Erika Harvey in Treatment: Harvey Vital Signs Time Taken: 08:30 Temperature (F): 98.Harvey Height (in): 63 Pulse (bpm): 87 Weight (lbs): 144 Respiratory Rate (breaths/min): 18 Body Mass Index (BMI): 25.Harvey Blood Pressure (mmHg): 106/73 Reference Range: 80 - 120 mg / dl Electronic Signature(s) Signed: 01/02/2022 Harvey:01:07 PM By: Adline Peals Entered By: Adline Peals on 01/02/2022 08:30:21

## 2022-01-09 ENCOUNTER — Encounter (HOSPITAL_BASED_OUTPATIENT_CLINIC_OR_DEPARTMENT_OTHER): Payer: 59 | Attending: General Surgery | Admitting: General Surgery

## 2022-01-09 DIAGNOSIS — Z833 Family history of diabetes mellitus: Secondary | ICD-10-CM | POA: Diagnosis not present

## 2022-01-09 DIAGNOSIS — L97822 Non-pressure chronic ulcer of other part of left lower leg with fat layer exposed: Secondary | ICD-10-CM | POA: Diagnosis not present

## 2022-01-09 DIAGNOSIS — I10 Essential (primary) hypertension: Secondary | ICD-10-CM | POA: Diagnosis not present

## 2022-01-09 NOTE — Progress Notes (Signed)
Erika, Harvey ([id number]) Visit Report for 01/09/2022 Chief Complaint Document Details Patient Name: Date of Service: Erika Harvey, Erika Harvey 01/09/2022 10:15 A M Medical Record Number: [id number] Patient Account Number: [id number] Date of Birth/Sex: Treating RN: 1961/05/08 (61 y.o. Erika Harvey Primary Care Provider: [name] Other Clinician: Referring Provider: Treating Provider/Extender: Mertha Finders in Treatment: 6 Information Obtained from: Patient Chief Complaint Patient seen for complaints of Non-Healing Wound. Electronic Signature(s) Signed: 01/09/2022 10:37:40 AM By: Fredirick Maudlin MD FACS Entered By: Fredirick Maudlin on 01/09/2022 10:37:40 -------------------------------------------------------------------------------- HPI Details Patient Name: Date of Service: Erika Councilman A. 01/09/2022 10:15 A M Medical Record Number: [id number] Patient Account Number: [id number] Date of Birth/Sex: Treating RN: 06/24/1961 (61 y.o. Erika Harvey Primary Care Provider: [name] Other Clinician: Referring Provider: Treating Provider/Extender: Mertha Finders in Treatment: 6 History of Present Illness HPI Description: ADMISSION 11/28/2021 This is a 61 year old woman with a past medical history significant for hypertension and breast cancer. She suffered a laceration to her left lower extremity when her dog apparently clawed her as he was jumping up to chase a squirrel. She treated this at home with peroxide and topical antibiotic ointment but it did not get better. She presented to the emergency department on 18 Nov 2021, about a week after the injury occurred. The ED provider told her to stop using the hydrogen peroxide and prescribed a 1 week course of Keflex. She returned to the emergency department on May 22 with concern that the wound was more red, more swollen, and more painful. The emergency room gave her a  shot of Rocephin and initiated doxycycline. She was referred to wound care for further evaluation and management. On the anterior tibial surface of her right leg, there is a wound in the shape of an inverted V with a flap of nonviable skin loosely adherent to the surface. There is eschar and old hematoma accumulated on the wound. The periwound skin is intact. There is no erythema, induration, odor, or discharge. 12/05/2021: The wound is very clean today. No slough accumulation. No drainage. The periwound skin is in good condition. 12/12/2021: The wound is smaller today. There is granulation tissue emerging. Just a small amount of slough. No concern for infection. 12/26/2021: The wound continues to contract. There is just a little bit of slough on the wound surface, as well as some hanging nonviable skin. No concern for infection. 01/02/2022: The wound is much smaller today. There is no slough accumulation. Periwound skin is intact. 01/09/2022: Her wound is healed. Electronic Signature(s) Signed: 01/09/2022 10:38:00 AM By: Fredirick Maudlin MD FACS Entered By: Fredirick Maudlin on 01/09/2022 10:37:59 -------------------------------------------------------------------------------- Physical Exam Details Patient Name: Date of Service: Erika Councilman A. 01/09/2022 10:15 A M Medical Record Number: [id number] Patient Account Number: [id number] Date of Birth/Sex: Treating RN: 1960/12/16 (61 y.o. Erika Harvey Primary Care Provider: [name] Other Clinician: Referring Provider: Treating Provider/Extender: Mertha Finders in Treatment: 6 Constitutional Slightly hypertensive. . . . No acute distress.Marland Kitchen Respiratory Normal work of breathing on room air.. Notes 01/09/2022: Her wound has healed. Electronic Signature(s) Signed: 01/09/2022 10:38:37 AM By: Fredirick Maudlin MD FACS Entered By: Fredirick Maudlin on 01/09/2022  10:38:36 -------------------------------------------------------------------------------- Physician Orders Details Patient Name: Date of Service: Erika Councilman A. 01/09/2022 10:15 A M Medical Record Number: [id number] Patient Account Number: [id number] Date of Birth/Sex: Treating RN: Sep 05, 1960 (61 y.o. Erika Harvey Primary Care Provider: [name] Other Clinician: Referring Provider: Treating  Provider/Extender: Mertha Finders in Treatment: 6 Verbal / Phone Orders: No Diagnosis Coding ICD-10 Coding Code Description I10 Essential (primary) hypertension L97.822 Non-pressure chronic ulcer of other part of left lower leg with fat layer exposed Discharge From [facility] Discharge from [place] your wound is healed! Electronic Signature(s) Signed: 01/09/2022 10:38:45 AM By: Fredirick Maudlin MD FACS Entered By: Fredirick Maudlin on 01/09/2022 10:38:45 -------------------------------------------------------------------------------- Problem List Details Patient Name: Date of Service: Erika Councilman A. 01/09/2022 10:15 A M Medical Record Number: [id number] Patient Account Number: [id number] Date of Birth/Sex: Treating RN: 12-11-1960 (61 y.o. Erika Harvey Primary Care Provider: [name] Other Clinician: Referring Provider: Treating Provider/Extender: Mertha Finders in Treatment: 6 Active Problems ICD-10 Encounter Code Description Active Date MDM Diagnosis I10 Essential (primary) hypertension 11/28/2021 No Yes L97.822 Non-pressure chronic ulcer of other part of left lower leg with fat layer exposed5/24/2023 No Yes Inactive Problems Resolved Problems Electronic Signature(s) Signed: 01/09/2022 10:36:11 AM By: Fredirick Maudlin MD FACS Entered By: Fredirick Maudlin on 01/09/2022 10:36:11 -------------------------------------------------------------------------------- Progress Note  Details Patient Name: Date of Service: Erika Councilman A. 01/09/2022 10:15 A M Medical Record Number: [id number] Patient Account Number: [id number] Date of Birth/Sex: Treating RN: 01-05-1961 (61 y.o. Erika Harvey Primary Care Provider: [name] Other Clinician: Referring Provider: Treating Provider/Extender: Mertha Finders in Treatment: 6 Subjective Chief Complaint Information obtained from Patient Patient seen for complaints of Non-Healing Wound. History of Present Illness (HPI) ADMISSION 11/28/2021 This is a 61 year old woman with a past medical history significant for hypertension and breast cancer. She suffered a laceration to her left lower extremity when her dog apparently clawed her as he was jumping up to chase a squirrel. She treated this at home with peroxide and topical antibiotic ointment but it did not get better. She presented to the emergency department on 18 Nov 2021, about a week after the injury occurred. The ED provider told her to stop using the hydrogen peroxide and prescribed a 1 week course of Keflex. She returned to the emergency department on May 22 with concern that the wound was more red, more swollen, and more painful. The emergency room gave her a shot of Rocephin and initiated doxycycline. She was referred to wound care for further evaluation and management. On the anterior tibial surface of her right leg, there is a wound in the shape of an inverted V with a flap of nonviable skin loosely adherent to the surface. There is eschar and old hematoma accumulated on the wound. The periwound skin is intact. There is no erythema, induration, odor, or discharge. 12/05/2021: The wound is very clean today. No slough accumulation. No drainage. The periwound skin is in good condition. 12/12/2021: The wound is smaller today. There is granulation tissue emerging. Just a small amount of slough. No concern for infection. 12/26/2021: The wound  continues to contract. There is just a little bit of slough on the wound surface, as well as some hanging nonviable skin. No concern for infection. 01/02/2022: The wound is much smaller today. There is no slough accumulation. Periwound skin is intact. 01/09/2022: Her wound is healed. Patient History Information obtained from Patient, Chart. Family History Cancer - Mother, Diabetes - Father, Heart Disease - Father, Hypertension - Father, No family history of Hereditary Spherocytosis, Kidney Disease, Lung Disease, Seizures, Stroke, Thyroid Problems, Tuberculosis. Social History Never smoker, Marital Status - Married, Alcohol Use - Rarely, Drug Use - No History, Caffeine Use -  Moderate - soda. Medical History Eyes Denies history of Cataracts, Glaucoma, Optic Neuritis Ear/Nose/Mouth/Throat Denies history of Chronic sinus problems/congestion, Middle ear problems Cardiovascular Patient has history of Hypertension Endocrine Denies history of Type I Diabetes, Type II Diabetes Genitourinary Denies history of End Stage Renal Disease Integumentary (Skin) Denies history of History of Burn Musculoskeletal Denies history of Gout, Rheumatoid Arthritis, Osteoarthritis, Osteomyelitis Oncologic Patient has history of Received Radiation Denies history of Received Chemotherapy Psychiatric Denies history of Anorexia/bulimia, Confinement Anxiety Hospitalization/Surgery History - breast lumpectomy. - lumbar surgery. - left knee surgery. - wisdom tooth extraction. Medical A Surgical History Notes nd Gastrointestinal GERD Musculoskeletal lumbar herniated disc, lumbar DDD Oncologic hx breast CA Objective Constitutional Slightly hypertensive. No acute distress.. Vitals Time Taken: 10:22 AM, Height: 63 in, Weight: 144 lbs, BMI: 25.5, Temperature: 97.8 F, Pulse: 85 bpm, Respiratory Rate: 16 breaths/min, Blood Pressure: 144/99 mmHg. Respiratory Normal work of breathing on room air.. General Notes:  01/09/2022: Her wound has healed. Integumentary (Hair, Skin) Wound #1 status is Open. Original cause of wound was Trauma. The date acquired was: 11/07/2021. The wound has been in treatment 6 weeks. The wound is located on the Left,Lateral Lower Leg. The wound measures 0.1cm length x 0.1cm width x 0.1cm depth; 0.008cm^2 area and 0.001cm^3 volume. There is Fat Layer (Subcutaneous Tissue) exposed. There is no tunneling or undermining noted. There is a medium amount of serosanguineous drainage noted. The wound margin is flat and intact. There is medium (34-66%) red granulation within the wound bed. There is a medium (34-66%) amount of necrotic tissue within the wound bed including Eschar and Adherent Slough. Wound #1 status is Healed - Epithelialized. Original cause of wound was Trauma. The date acquired was: 11/07/2021. The wound has been in treatment 6 weeks. The wound is located on the Left,Lateral Lower Leg. The wound measures 0cm length x 0cm width x 0cm depth; 0cm^2 area and 0cm^3 volume. There is no tunneling or undermining noted. There is a none present amount of drainage noted. The wound margin is flat and intact. There is no granulation within the wound bed. There is no necrotic tissue within the wound bed. Assessment Active Problems ICD-10 Essential (primary) hypertension Non-pressure chronic ulcer of other part of left lower leg with fat layer exposed Plan Discharge From [facility]: Discharge from [place] your wound is healed! 01/09/2022: Her wound has healed. Because it is still quite fresh, I did recommend that she take measures to avoid traumatizing the site for another couple of weeks. We will discharge her from the wound care center. She may follow-up as needed. Electronic Signature(s) Signed: 01/09/2022 10:39:23 AM By: Fredirick Maudlin MD FACS Entered By: Fredirick Maudlin on 01/09/2022  10:39:23 -------------------------------------------------------------------------------- HxROS Details Patient Name: Date of Service: Erika Councilman A. 01/09/2022 10:15 A M Medical Record Number: [id number] Patient Account Number: [id number] Date of Birth/Sex: Treating RN: 1960/12/09 (61 y.o. Erika Harvey Primary Care Provider: [name] Other Clinician: Referring Provider: Treating Provider/Extender: Mertha Finders in Treatment: 6 Information Obtained From Patient Chart Eyes Medical History: Negative for: Cataracts; Glaucoma; Optic Neuritis Ear/Nose/Mouth/Throat Medical History: Negative for: Chronic sinus problems/congestion; Middle ear problems Cardiovascular Medical History: Positive for: Hypertension Gastrointestinal Medical History: Past Medical History Notes: GERD Endocrine Medical History: Negative for: Type I Diabetes; Type II Diabetes Genitourinary Medical History: Negative for: End Stage Renal Disease Integumentary (Skin) Medical History: Negative for: History of Burn Musculoskeletal Medical History: Negative for: Gout; Rheumatoid Arthritis; Osteoarthritis; Osteomyelitis Past Medical  History Notes: lumbar herniated disc, lumbar DDD Oncologic Medical History: Positive for: Received Radiation Negative for: Received Chemotherapy Past Medical History Notes: hx breast CA Psychiatric Medical History: Negative for: Anorexia/bulimia; Confinement Anxiety Immunizations Pneumococcal Vaccine: Received Pneumococcal Vaccination: No Implantable Devices No devices added Hospitalization / Surgery History Type of Hospitalization/Surgery breast lumpectomy lumbar surgery left knee surgery wisdom tooth extraction Family and Social History Cancer: Yes - Mother; Diabetes: Yes - Father; Heart Disease: Yes - Father; Hereditary Spherocytosis: No; Hypertension: Yes - Father; Kidney Disease: No; Lung Disease: No; Seizures: No;  Stroke: No; Thyroid Problems: No; Tuberculosis: No; Never smoker; Marital Status - Married; Alcohol Use: Rarely; Drug Use: No History; Caffeine Use: Moderate - soda; Financial Concerns: No; Food, Clothing or Shelter Needs: No; Support System Lacking: No; Transportation Concerns: No Electronic Signature(s) Signed: 01/09/2022 12:22:20 PM By: Fredirick Maudlin MD FACS Signed: 01/09/2022 1:09:53 PM By: Dellie Catholic RN Entered By: Fredirick Maudlin on 01/09/2022 10:38:04 -------------------------------------------------------------------------------- SuperBill Details Patient Name: Date of Service: Erika Councilman A. 01/09/2022 Medical Record Number: [id number] Patient Account Number: [id number] Date of Birth/Sex: Treating RN: March 30, 1961 (61 y.o. Erika Harvey Primary Care Provider: [name] Other Clinician: Referring Provider: Treating Provider/Extender: Mertha Finders in Treatment: 6 Diagnosis Coding ICD-10 Codes Code Description I10 Essential (primary) hypertension [phone] Non-pressure chronic ulcer of other part of left lower leg with fat layer exposed Facility Procedures CPT4 Code: [id number] Description: [place] VISIT-LEV 3 EST PT Modifier: Quantity: 1 Physician Procedures : CPT4 Code Description Modifier [id number] - WC PHYS LEVEL 3 - EST PT ICD-10 Diagnosis Description L97.822 Non-pressure chronic ulcer of other part of left lower leg with fat layer exposed I10 Essential (primary) hypertension Quantity: 1 Electronic Signature(s) Signed: 01/09/2022 10:39:35 AM By: Fredirick Maudlin MD FACS Entered By: Fredirick Maudlin on 01/09/2022 10:39:35

## 2022-01-09 NOTE — Progress Notes (Signed)
Erika Harvey ([id number]) Visit Report for 01/09/2022 Arrival Information Details Patient Name: Date of Service: Erika Harvey, Erika Harvey 01/09/2022 10:15 A M Medical Record Number: [id number] Patient Account Number: [id number] Date of Birth/Sex: Treating RN: [birth year] (61 y.o. [name] Primary Care Marselino Slayton: [name] Other Clinician: Referring Miho Monda: Treating Zavian Slowey/Extender: Mertha Finders in Treatment: 6 Visit Information History Since Last Visit Added or deleted any medications: No Patient Arrived: Ambulatory Any new allergies or adverse reactions: No Arrival Time: 10:22 Had a fall or experienced change in No Accompanied By: self activities of daily living that may affect Transfer Assistance: None risk of falls: Patient Requires Transmission-Based Precautions: No Signs or symptoms of abuse/neglect since last visito No Patient Has Alerts: No Hospitalized since last visit: No Implantable device outside of the clinic excluding No cellular tissue based products placed in the center since last visit: Has Dressing in Place as Prescribed: Yes Pain Present Now: No Electronic Signature(s) Signed: 01/09/2022 1:09:53 PM By: Dellie Catholic RN Entered By: Dellie Catholic on 01/09/2022 10:23:28 -------------------------------------------------------------------------------- Clinic Level of Care Assessment Details Patient Name: Date of Service: Erika Harvey 01/09/2022 10:15 A M Medical Record Number: [id number] Patient Account Number: [id number] Date of Birth/Sex: Treating RN: [birth year] (61 y.o. [name] Primary Care Joseantonio Dittmar: [name] Other Clinician: Referring Keola Heninger: Treating Dawnielle Christiana/Extender: Mertha Finders in Treatment: 6 Clinic Level of Care Assessment Items TOOL 4 Quantity Score X- 1 0 Use when only an EandM is performed on FOLLOW-UP visit ASSESSMENTS - Nursing Assessment /  Reassessment X- 1 10 Reassessment of Co-morbidities (includes updates in patient status) X- 1 5 Reassessment of Adherence to Treatment Plan ASSESSMENTS - Wound and Skin A ssessment / Reassessment X - Simple Wound Assessment / Reassessment - one wound 1 5 '[]'$  - 0 Complex Wound Assessment / Reassessment - multiple wounds '[]'$  - 0 Dermatologic / Skin Assessment (not related to wound area) ASSESSMENTS - Focused Assessment '[]'$  - 0 Circumferential Edema Measurements - multi extremities '[]'$  - 0 Nutritional Assessment / Counseling / Intervention '[]'$  - 0 Lower Extremity Assessment (monofilament, tuning fork, pulses) '[]'$  - 0 Peripheral Arterial Disease Assessment (using hand held doppler) ASSESSMENTS - Ostomy and/or Continence Assessment and Care '[]'$  - 0 Incontinence Assessment and Management '[]'$  - 0 Ostomy Care Assessment and Management (repouching, etc.) PROCESS - Coordination of Care X - Simple Patient / Family Education for ongoing care 1 15 '[]'$  - 0 Complex (extensive) Patient / Family Education for ongoing care X- 1 10 Staff obtains Programmer, systems, Records, T Results / Process Orders est X- 1 10 Staff telephones HHA, Nursing Homes / Clarify orders / etc '[]'$  - 0 Routine Transfer to another Facility (non-emergent condition) '[]'$  - 0 Routine Hospital Admission (non-emergent condition) '[]'$  - 0 New Admissions / Biomedical engineer / Ordering NPWT Apligraf, etc. , '[]'$  - 0 Emergency Hospital Admission (emergent condition) X- 1 10 Simple Discharge Coordination '[]'$  - 0 Complex (extensive) Discharge Coordination PROCESS - Special Needs '[]'$  - 0 Pediatric / Minor Patient Management '[]'$  - 0 Isolation Patient Management '[]'$  - 0 Hearing / Language / Visual special needs '[]'$  - 0 Assessment of Community assistance (transportation, D/C planning, etc.) '[]'$  - 0 Additional assistance / Altered mentation '[]'$  - 0 Support Surface(s) Assessment (bed, cushion, seat, etc.) INTERVENTIONS - Wound Cleansing /  Measurement X - Simple Wound Cleansing - one wound 1 5 '[]'$  - 0 Complex Wound Cleansing - multiple wounds X- 1 5 Wound Imaging (photographs - any  number of wounds) '[]'$  - 0 Wound Tracing (instead of photographs) X- 1 5 Simple Wound Measurement - one wound '[]'$  - 0 Complex Wound Measurement - multiple wounds INTERVENTIONS - Wound Dressings '[]'$  - 0 Small Wound Dressing one or multiple wounds '[]'$  - 0 Medium Wound Dressing one or multiple wounds '[]'$  - 0 Large Wound Dressing one or multiple wounds '[]'$  - 0 Application of Medications - topical '[]'$  - 0 Application of Medications - injection INTERVENTIONS - Miscellaneous '[]'$  - 0 External ear exam '[]'$  - 0 Specimen Collection (cultures, biopsies, blood, body fluids, etc.) '[]'$  - 0 Specimen(s) / Culture(s) sent or taken to Lab for analysis '[]'$  - 0 Patient Transfer (multiple staff / Civil Service fast streamer / Similar devices) '[]'$  - 0 Simple Staple / Suture removal (25 or less) '[]'$  - 0 Complex Staple / Suture removal (26 or more) '[]'$  - 0 Hypo / Hyperglycemic Management (close monitor of Blood Glucose) '[]'$  - 0 Ankle / Brachial Index (ABI) - do not check if billed separately X- 1 5 Vital Signs Has the patient been seen at the hospital within the last three years: Yes Total Score: 85 Level Of Care: New/Established - Level 3 Electronic Signature(s) Signed: 01/09/2022 1:09:53 PM By: Dellie Catholic RN Entered By: Dellie Catholic on 01/09/2022 10:37:53 -------------------------------------------------------------------------------- Encounter Discharge Information Details Patient Name: Date of Service: Erika Councilman A. 01/09/2022 10:15 A M Medical Record Number: [id number] Patient Account Number: [id number] Date of Birth/Sex: Treating RN: [birth year] (61 y.o. [name] Primary Care Avrielle Fry: [name] Other Clinician: Referring Adalina Dopson: Treating Travis Mastel/Extender: Mertha Finders in Treatment: 6 Encounter Discharge  Information Items Discharge Condition: Stable Ambulatory Status: Ambulatory Discharge Destination: Home Transportation: Private Auto Accompanied By: spouse Schedule Follow-up Appointment: Yes Clinical Summary of Care: Patient Declined Electronic Signature(s) Signed: 01/09/2022 1:09:53 PM By: Dellie Catholic RN Entered By: Dellie Catholic on 01/09/2022 10:38:45 -------------------------------------------------------------------------------- Lower Extremity Assessment Details Patient Name: Date of Service: Erika Councilman A. 01/09/2022 10:15 A M Medical Record Number: [id number] Patient Account Number: [id number] Date of Birth/Sex: Treating RN: 12/11/1960 (61 y.o. [name] Primary Care Ronia Hazelett: [name] Other Clinician: Referring Daeveon Zweber: Treating Arthuro Canelo/Extender: Mertha Finders in Treatment: 6 Edema Assessment Assessed: [Left: No] [Right: No] Edema: [Left: N] [Right: o] Calf Left: Right: Point of Measurement: From Medial Instep 34 cm Ankle Left: Right: Point of Measurement: From Medial Instep 21.8 cm Electronic Signature(s) Signed: 01/09/2022 1:09:53 PM By: Dellie Catholic RN Entered By: Dellie Catholic on 01/09/2022 10:27:16 -------------------------------------------------------------------------------- Multi Wound Chart Details Patient Name: Date of Service: Erika Councilman A. 01/09/2022 10:15 A M Medical Record Number: [id number] Patient Account Number: [id number] Date of Birth/Sex: Treating RN: 17-Apr-1961 (61 y.o. [name] Primary Care Bertin Inabinet: [name] Other Clinician: Referring Madox Corkins: Treating Wyllow Seigler/Extender: Mertha Finders in Treatment: 6 Vital Signs Height(in): 2 Pulse(bpm): 61 Weight(lbs): 144 Blood Pressure(mmHg): 144/99 Body Mass Index(BMI): 25.5 Temperature(F): 97.8 Respiratory Rate(breaths/min): 16 Photos: [N/A:No Photos N/A] Left, Lateral Lower Leg Left,  Lateral Lower Leg N/A Wound Location: Trauma Trauma N/A Wounding Event: Trauma, Other Trauma, Other N/A Primary Etiology: Hypertension, Received Radiation Hypertension, Received Radiation N/A Comorbid History: 11/07/2021 11/07/2021 N/A Date Acquired: 6 6 N/A Weeks of Treatment: Open Healed - Epithelialized N/A Wound Status: No No N/A Wound Recurrence: 0.1x0.1x0.1 0x0x0 N/A Measurements L x W x D (cm) 0.008 0 N/A A (cm) : rea 0.001 0 N/A Volume (cm) : 99.90% 100.00% N/A % Reduction in A rea: 99.90% 100.00% N/A %  Reduction in Volume: Full Thickness Without Exposed Full Thickness Without Exposed N/A Classification: Support Structures Support Structures Medium None Present N/A Exudate A mount: Serosanguineous N/A N/A Exudate Type: red, brown N/A N/A Exudate Color: Flat and Intact Flat and Intact N/A Wound Margin: Medium (34-66%) None Present (0%) N/A Granulation Amount: Red N/A N/A Granulation Quality: Medium (34-66%) None Present (0%) N/A Necrotic Amount: Eschar, Adherent Slough N/A N/A Necrotic Tissue: Fat Layer (Subcutaneous Tissue): Yes Fascia: No N/A Exposed Structures: Fascia: No Fat Layer (Subcutaneous Tissue): No Tendon: No Tendon: No Muscle: No Muscle: No Joint: No Joint: No Bone: No Bone: No Small (1-33%) None N/A Epithelialization: Treatment Notes Electronic Signature(s) Signed: 01/09/2022 10:37:07 AM By: Fredirick Maudlin MD FACS Signed: 01/09/2022 1:09:53 PM By: Dellie Catholic RN Entered By: Fredirick Maudlin on 01/09/2022 10:37:07 -------------------------------------------------------------------------------- Multi-Disciplinary Care Plan Details Patient Name: Date of Service: Erika Councilman A. 01/09/2022 10:15 A M Medical Record Number: [id number] Patient Account Number: [id number] Date of Birth/Sex: Treating RN: May 04, 1961 (61 y.o. [name] Primary Care Carmelina Balducci: [name] Other Clinician: Referring Sharyl Panchal: Treating  Ieesha Abbasi/Extender: Mertha Finders in Treatment: [place] reviewed with physician Active Inactive Electronic Signature(s) Signed: 01/09/2022 1:09:53 PM By: Dellie Catholic RN Entered By: Dellie Catholic on 01/09/2022 10:36:47 -------------------------------------------------------------------------------- Pain Assessment Details Patient Name: Date of Service: Erika Harvey, Erika Harvey 01/09/2022 10:15 A M Medical Record Number: [id number] Patient Account Number: [id number] Date of Birth/Sex: Treating RN: 1961-02-14 (61 y.o. [name] Primary Care Solly Derasmo: [name] Other Clinician: Referring Shanise Balch: Treating Shekera Beavers/Extender: Mertha Finders in Treatment: 6 Active Problems Location of Pain Severity and Description of Pain Patient Has Paino No Site Locations Pain Management and Medication Current Pain Management: Electronic Signature(s) Signed: 01/09/2022 1:09:53 PM By: Dellie Catholic RN Entered By: Dellie Catholic on 01/09/2022 10:25:16 -------------------------------------------------------------------------------- Patient/Caregiver Education Details Patient Name: Date of Service: Erika Harvey 7/5/2023andnbsp10:15 [place] Record Number: [id number] Patient Account Number: [id number] Date of Birth/Gender: Treating RN: 1960-07-29 (61 y.o. [name] Primary Care Physician: [name] Other Clinician: Referring Physician: Treating Physician/Extender: Mertha Finders in Treatment: 6 Education Assessment Education Provided To: Patient Education Topics Provided Wound/Skin Impairment: Methods: Explain/Verbal Responses: Return demonstration correctly Electronic Signature(s) Signed: 01/09/2022 1:09:53 PM By: Dellie Catholic RN Entered By: Dellie Catholic on 01/09/2022  10:37:00 -------------------------------------------------------------------------------- Wound Assessment Details Patient Name: Date of Service: Erika Councilman A. 01/09/2022 10:15 A M Medical Record Number: [id number] Patient Account Number: [id number] Date of Birth/Sex: Treating RN: 1960/12/11 (61 y.o. [name] Primary Care Osinachi Navarrette: [name] Other Clinician: Referring Ory Elting: Treating Tayshawn Purnell/Extender: Mertha Finders in Treatment: 6 Wound Status Wound Number: 1 Primary Etiology: Trauma, Other Wound Location: Left, Lateral Lower Leg Wound Status: Open Wounding Event: Trauma Comorbid History: Hypertension, Received Radiation Date Acquired: 11/07/2021 Weeks Of Treatment: 6 Clustered Wound: No Photos Wound Measurements Length: (cm) 0.1 Width: (cm) 0.1 Depth: (cm) 0.1 Area: (cm) 0.008 Volume: (cm) 0.001 % Reduction in Area: 99.9% % Reduction in Volume: 99.9% Epithelialization: Small (1-33%) Tunneling: No Undermining: No Wound Description Classification: Full Thickness Without Exposed Support Structu Wound Margin: Flat and Intact Exudate Amount: Medium Exudate Type: Serosanguineous Exudate Color: red, brown res Foul Odor After Cleansing: No Slough/Fibrino No Wound Bed Granulation Amount: Medium (34-66%) Exposed Structure Granulation Quality: Red Fascia Exposed: No Necrotic Amount: Medium (34-66%) Fat Layer (Subcutaneous Tissue) Exposed: Yes Necrotic Quality: Eschar, Adherent Slough Tendon Exposed: No Muscle Exposed: No Joint Exposed: No  Bone Exposed: No Electronic Signature(s) Signed: 01/09/2022 1:09:53 PM By: Dellie Catholic RN Entered By: Dellie Catholic on 01/09/2022 10:30:31 -------------------------------------------------------------------------------- Wound Assessment Details Patient Name: Date of Service: Erika Councilman A. 01/09/2022 10:15 A M Medical Record Number: [id number] Patient Account Number:  [id number] Date of Birth/Sex: Treating RN: 09-04-1960 (61 y.o. [name] Primary Care Nuri Larmer: [name] Other Clinician: Referring Hagen Bohorquez: Treating Merisa Julio/Extender: Mertha Finders in Treatment: 6 Wound Status Wound Number: 1 Primary Etiology: Trauma, Other Wound Location: Left, Lateral Lower Leg Wound Status: Healed - Epithelialized Wounding Event: Trauma Comorbid History: Hypertension, Received Radiation Date Acquired: 11/07/2021 Weeks Of Treatment: 6 Clustered Wound: No Wound Measurements Length: (cm) Width: (cm) Depth: (cm) Area: (cm) Volume: (cm) 0 % Reduction in Area: 100% 0 % Reduction in Volume: 100% 0 Epithelialization: None 0 Tunneling: No 0 Undermining: No Wound Description Classification: Full Thickness Without Exposed Support Structu Wound Margin: Flat and Intact Exudate Amount: None Present res Foul Odor After Cleansing: No Slough/Fibrino No Wound Bed Granulation Amount: None Present (0%) Exposed Structure Necrotic Amount: None Present (0%) Fascia Exposed: No Fat Layer (Subcutaneous Tissue) Exposed: No Tendon Exposed: No Muscle Exposed: No Joint Exposed: No Bone Exposed: No Electronic Signature(s) Signed: 01/09/2022 1:09:53 PM By: Dellie Catholic RN Entered By: Dellie Catholic on 01/09/2022 10:34:27 -------------------------------------------------------------------------------- Vitals Details Patient Name: Date of Service: Erika Councilman A. 01/09/2022 10:15 A M Medical Record Number: [id number] Patient Account Number: [id number] Date of Birth/Sex: Treating RN: 1961-05-16 (61 y.o. [name] Primary Care Ronson Hagins: [name] Other Clinician: Referring Daisi Kentner: Treating Keari Miu/Extender: Mertha Finders in Treatment: 6 Vital Signs Time Taken: 10:22 Temperature (F): 97.8 Height (in): 63 Pulse (bpm): 85 Weight (lbs): 144 Respiratory Rate (breaths/min):  16 Body Mass Index (BMI): 25.5 Blood Pressure (mmHg): 144/99 Reference Range: 80 - 120 mg / dl Electronic Signature(s) Signed: 01/09/2022 1:09:53 PM By: Dellie Catholic RN Entered By: Dellie Catholic on 01/09/2022 10:25:11

## 2022-01-13 ENCOUNTER — Other Ambulatory Visit: Payer: Self-pay | Admitting: Family Medicine

## 2022-01-13 DIAGNOSIS — F419 Anxiety disorder, unspecified: Secondary | ICD-10-CM

## 2022-01-14 NOTE — Telephone Encounter (Signed)
Pharmacy faxed a refill request for traMADol (ULTRAM) 50 MG tablet [[id number]]    Order Details Dose: 50 mg Route: Oral Frequency: Every 6 hours PRN  Dispense Quantity: 120 tablet Refills: 0        Sig: Take 1 tablet (50 mg total) by mouth every 6 (six) hours as needed.       Start Date: 12/17/21 End Date: 01/16/22

## 2022-01-14 NOTE — Telephone Encounter (Signed)
LOV 04/06/21 Last refill 12/16/21, #60, 0 refills  Please review, thanks!

## 2022-01-17 ENCOUNTER — Telehealth: Payer: Self-pay

## 2022-01-17 ENCOUNTER — Telehealth: Payer: Self-pay | Admitting: Family Medicine

## 2022-01-17 ENCOUNTER — Other Ambulatory Visit: Payer: Self-pay | Admitting: Family Medicine

## 2022-01-17 MED ORDER — TRAMADOL HCL 50 MG PO TABS
50.0000 mg | ORAL_TABLET | Freq: Four times a day (QID) | ORAL | 0 refills | Status: AC | PRN
Start: 2022-01-17 — End: 2022-02-16

## 2022-01-17 NOTE — Telephone Encounter (Signed)
Pt called in inquiring about her refill of traMADol (ULTRAM) 50 MG tablet [[id number]. Please advise.  LOV: 04/06/21  Cb#: [id number]

## 2022-01-17 NOTE — Telephone Encounter (Signed)
LOV 04/06/2021  Received fax from [facility] requesting a refill on cyclobenzaprine (FLEXERIL) 10 MG tablet [[id number]]    Order Details Dose, Route, Frequency: As Directed  Dispense Quantity: 60 tablet Refills: 2        Sig: TAKE ONE TABLET BY MOUTH THREE TIMES A DAY AS NEEDED FOR MUSCLE SPASMS

## 2022-01-18 ENCOUNTER — Other Ambulatory Visit: Payer: Self-pay | Admitting: Family Medicine

## 2022-01-18 MED ORDER — CYCLOBENZAPRINE HCL 10 MG PO TABS: ORAL_TABLET | ORAL | 2 refills | Status: DC

## 2022-01-21 ENCOUNTER — Ambulatory Visit (INDEPENDENT_AMBULATORY_CARE_PROVIDER_SITE_OTHER): Payer: 59 | Admitting: Family Medicine

## 2022-01-21 VITALS — BP 110/56 | HR 98 | Temp 98.2°F | Ht 63.0 in | Wt 141.0 lb

## 2022-01-21 DIAGNOSIS — M25561 Pain in right knee: Secondary | ICD-10-CM

## 2022-01-21 DIAGNOSIS — M25562 Pain in left knee: Secondary | ICD-10-CM

## 2022-01-21 DIAGNOSIS — G8929 Other chronic pain: Secondary | ICD-10-CM

## 2022-01-21 NOTE — Progress Notes (Signed)
Subjective:    Patient ID: Erika Harvey, female    DOB: [birth year], 61 y.o.   MRN: [id number]  Knee Pain   Patient presents today complaining of bilateral chronic knee pain.  The pain hurts primarily going up and down steps.  She has had 2 previous knee surgeries.  Both knees hurt equally.  She complains of pain underneath the patella bilaterally.  She denies any laxity to varus or valgus stress.  However she is unable to take NSAIDs due to a history of acute renal insufficiency while taking Celebrex.  She takes tramadol for pain.  Is been more than 3 months since she had a cortisone shot in each knee and she is requesting a repeat cortisone injection.  Of note, patient recently underwent a lumpectomy for breast cancer and also underwent radiation therapy Past Medical History:  Diagnosis Date   Allergy    Anxiety    Arthritis    Breast cancer ([place]) 07/19/2021   Chronic low back pain    Fibroid 03/30/2015   Frequent UTI    Hypertension    Vaginal bleeding 03/30/2015   Past Surgical History:  Procedure Laterality Date   BACK SURGERY  05-16-2015   BREAST LUMPECTOMY WITH RADIOACTIVE SEED AND SENTINEL LYMPH NODE BIOPSY Left 08/08/2021   Procedure: LEFT BREAST LUMPECTOMY WITH RADIOACTIVE SEED AND SENTINEL LYMPH NODE BIOPSY;  Surgeon: Coralie Keens, MD;  Location: [facility];  Service: General;  Laterality: Left;   KNEE SURGERY     left   KNEE SURGERY Left 07/08/2012   WISDOM TOOTH EXTRACTION     Current Outpatient Medications on File Prior to Visit  Medication Sig Dispense Refill   anastrozole (ARIMIDEX) 1 MG tablet Take 1 tablet (1 mg total) by mouth daily. 30 tablet 3   atenolol (TENORMIN) 25 MG tablet Take 1 tablet (25 mg total) by mouth daily. 90 tablet 2   atorvastatin (LIPITOR) 40 MG tablet Take 1 tablet (40 mg total) by mouth daily. 90 tablet 1   calcium-vitamin D (OSCAL WITH D) 250-125 MG-UNIT per tablet Take 1 tablet by mouth daily.     cyclobenzaprine  (FLEXERIL) 10 MG tablet TAKE ONE TABLET BY MOUTH THREE TIMES A DAY AS NEEDED FOR MUSCLE SPASMS 60 tablet 2   diazepam (VALIUM) 5 MG tablet TAKE ONE TABLET BY MOUTH TWICE A DAY AS NEEDED FOR ANXIETY 60 tablet 0   diclofenac Sodium (VOLTAREN) 1 % GEL Apply 2 g topically 4 (four) times daily. 100 g 5   DULoxetine (CYMBALTA) 60 MG capsule Take 1 capsule (60 mg total) by mouth daily. 90 capsule 3   fish oil-omega-3 fatty acids 1000 MG capsule Take 2 g by mouth 2 (two) times daily.     fluticasone (FLONASE) 50 MCG/ACT nasal spray Place 2 sprays into both nostrils daily. 16 g 6   losartan (COZAAR) 100 MG tablet TAKE ONE TABLET BY MOUTH DAILY 90 tablet 2   omeprazole (PRILOSEC) 20 MG capsule Take 1 capsule (20 mg total) by mouth daily. 90 capsule 3   traMADol (ULTRAM) 50 MG tablet Take 1 tablet (50 mg total) by mouth every 6 (six) hours as needed. 120 tablet 0   zolpidem (AMBIEN) 10 MG tablet Take 1 tablet (10 mg total) by mouth at bedtime as needed for sleep. 30 tablet 1   No current facility-administered medications on file prior to visit.   Allergies  Allergen Reactions   Norvasc [Amlodipine Besylate] Swelling and Rash   Social History  Socioeconomic History   Marital status: Married    Spouse name: Not on file   Number of children: Not on file   Years of education: Not on file   Highest education level: Not on file  Occupational History   Not on file  Tobacco Use   Smoking status: Never   Smokeless tobacco: Never  Vaping Use   Vaping Use: Never used  Substance and Sexual Activity   Alcohol use: Yes    Alcohol/week: 5.0 standard drinks of alcohol    Types: 5 Cans of beer per week   Drug use: No   Sexual activity: Yes    Birth control/protection: Pill  Other Topics Concern   Not on file  Social History Narrative   Not on file   Social Determinants of Health   Financial Resource Strain: Not on file  Food Insecurity: Not on file  Transportation Needs: Not on file  Physical  Activity: Not on file  Stress: Not on file  Social Connections: Not on file  Intimate Partner Violence: Not on file     Review of Systems  All other systems reviewed and are negative.      Objective:   Physical Exam Vitals reviewed.  Constitutional:      Appearance: Normal appearance.  Cardiovascular:     Rate and Rhythm: Normal rate and regular rhythm.     Heart sounds: Normal heart sounds.  Pulmonary:     Effort: Pulmonary effort is normal. No respiratory distress.     Breath sounds: Normal breath sounds. No stridor. No wheezing, rhonchi or rales.  Chest:     Chest wall: No tenderness.  Abdominal:     General: Abdomen is flat. Bowel sounds are normal.     Palpations: Abdomen is soft.  Musculoskeletal:     Right knee: Bony tenderness and crepitus present. Decreased range of motion. No LCL laxity, MCL laxity, ACL laxity or PCL laxity.     Left knee: Bony tenderness and crepitus present. Decreased range of motion. No LCL laxity, MCL laxity, ACL laxity or PCL laxity. Neurological:     Mental Status: She is alert.     Soft tissue swelling anterior to knee.       Assessment & Plan:  Bilateral chronic knee pain  Likely due to osteoarthritis in both knees.  Using sterile technique, I injected the right knee with 2 cc lidocaine, 2 cc of Marcaine, and 2 cc of 40 mg/mL Kenalog.  Then using sterile technique I injected the left knee with 2 cc lidocaine, 2 cc of Marcaine, and 2 cc of 40 mg/mL Kenalog.

## 2022-02-06 ENCOUNTER — Other Ambulatory Visit: Payer: Self-pay | Admitting: Family Medicine

## 2022-02-06 DIAGNOSIS — F419 Anxiety disorder, unspecified: Secondary | ICD-10-CM

## 2022-02-06 NOTE — Telephone Encounter (Signed)
Requested medication (s) are due for refill today - yes  Requested medication (s) are on the active medication list -yes  Future visit scheduled -no  Last refill: 01/14/22 #60  Notes to clinic: non delegated Rx  Requested Prescriptions  Pending Prescriptions Disp Refills   diazepam (VALIUM) 5 MG tablet [Pharmacy Med Name: diazePAM 5 MG TABLET] 60 tablet     Sig: TAKE ONE TABLET BY MOUTH TWICE A DAY AS NEEDED FOR ANXIETY     Not Delegated - Psychiatry: Anxiolytics/Hypnotics 2 Failed - 02/06/2022  2:19 PM      Failed - This refill cannot be delegated      Failed - Urine Drug Screen completed in last 360 days      Failed - Valid encounter within last 6 months    Recent Outpatient Visits           10 months ago Chronic pain of left knee   [place], Cammie Mcgee, MD   1 year ago Bilateral chronic knee pain   [place] [facility]enn[facility] Schaumann, Cammie Mcgee, MD   1 year ago Bilateral chronic knee pain   [place] Dennard Schaumann, Cammie Mcgee, MD   2 years ago Acute renal insufficiency   [place] Dennard Schaumann, Cammie Mcgee, MD   2 years ago Benign essential HTN   [place], Cammie Mcgee, MD              Passed - Patient is not pregnant         Requested Prescriptions  Pending Prescriptions Disp Refills   diazepam (VALIUM) 5 MG tablet [Pharmacy Med Name: diazePAM 5 MG TABLET] 60 tablet     Sig: TAKE ONE TABLET BY MOUTH TWICE A DAY AS NEEDED FOR ANXIETY     Not Delegated - Psychiatry: Anxiolytics/Hypnotics 2 Failed - 02/06/2022  2:19 PM      Failed - This refill cannot be delegated      Failed - Urine Drug Screen completed in last 360 days      Failed - Valid encounter within last 6 months    Recent Outpatient Visits           10 months ago Chronic pain of left knee   [place] [name], MD   1 year ago Bilateral chronic knee pain   [place] Dennard Schaumann,  Cammie Mcgee, MD   1 year ago Bilateral chronic knee pain   [place] Denn[facility] Schaumann, Cammie Mcgee, MD   2 years ago Acute renal insufficiency   [place] [name], MD   2 years ago Benign essential HTN   [place], Cammie Mcgee, MD              Passed - Patient is not pregnant

## 2022-02-11 ENCOUNTER — Other Ambulatory Visit: Payer: Self-pay | Admitting: *Deleted

## 2022-02-11 DIAGNOSIS — Z170 Estrogen receptor positive status [ER+]: Secondary | ICD-10-CM

## 2022-02-11 MED ORDER — ANASTROZOLE 1 MG PO TABS: 1.0000 mg | ORAL_TABLET | Freq: Every day | ORAL | 3 refills | Status: DC

## 2022-02-11 NOTE — Telephone Encounter (Signed)
Patient called to request refill of anastrozole, she took last one last night. She apologized for calling on short notice, stated she thought she had more left. Refilled per Dr. Rob Hickman OV note 01/02/22

## 2022-02-12 ENCOUNTER — Other Ambulatory Visit: Payer: Self-pay | Admitting: Family Medicine

## 2022-02-12 DIAGNOSIS — F419 Anxiety disorder, unspecified: Secondary | ICD-10-CM

## 2022-02-13 NOTE — Telephone Encounter (Signed)
Requested medication (s) are due for refill today - yes  Requested medication (s) are on the active medication list -yes  Future visit scheduled -no  Last refill: 01/14/22 #60  Notes to clinic: non delegated Rx  Requested Prescriptions  Pending Prescriptions Disp Refills   diazepam (VALIUM) 5 MG tablet [Pharmacy Med Name: diazePAM 5 MG TABLET] 60 tablet     Sig: TAKE ONE TABLET BY MOUTH TWICE A DAY AS NEEDED FOR ANXIETY     Not Delegated - Psychiatry: Anxiolytics/Hypnotics 2 Failed - 02/12/2022  5:20 PM      Failed - This refill cannot be delegated      Failed - Urine Drug Screen completed in last 360 days      Failed - Valid encounter within last 6 months    Recent Outpatient Visits           10 months ago Chronic pain of left knee   [place], Cammie Mcgee, MD   1 year ago Bilateral chronic knee pain   [place] [facility], Cammie Mcgee, MD   1 year ago Bilateral chronic knee pain   [place] [facility] [facility], Cammie Mcgee, MD   2 years ago Acute renal insufficiency   [place] [facility], Cammie Mcgee, MD   2 years ago Benign essential HTN   [place], Cammie Mcgee, MD              Passed - Patient is not pregnant         Requested Prescriptions  Pending Prescriptions Disp Refills   diazepam (VALIUM) 5 MG tablet [Pharmacy Med Name: diazePAM 5 MG TABLET] 60 tablet     Sig: TAKE ONE TABLET BY MOUTH TWICE A DAY AS NEEDED FOR ANXIETY     Not Delegated - Psychiatry: Anxiolytics/Hypnotics 2 Failed - 02/12/2022  5:20 PM      Failed - This refill cannot be delegated      Failed - Urine Drug Screen completed in last 360 days      Failed - Valid encounter within last 6 months    Recent Outpatient Visits           10 months ago Chronic pain of left knee   [place] [name], MD   1 year ago Bilateral chronic knee pain   [place] [facility],  Cammie Mcgee, MD   1 year ago Bilateral chronic knee pain   [place] [facility], Cammie Mcgee, MD   2 years ago Acute renal insufficiency   [place] [name], MD   2 years ago Benign essential HTN   [place], Cammie Mcgee, MD              Passed - Patient is not pregnant

## 2022-02-14 ENCOUNTER — Other Ambulatory Visit: Payer: Self-pay | Admitting: Family Medicine

## 2022-02-14 DIAGNOSIS — F419 Anxiety disorder, unspecified: Secondary | ICD-10-CM

## 2022-02-14 NOTE — Telephone Encounter (Signed)
Requested medication (s) are due for refill today: yes  Requested medication (s) are on the active medication list: yes  Last refill:  01/14/22 #30   Future visit scheduled: no  Notes to clinic:  not delegated per protocol. Do you want to give courtesy refill?     Requested Prescriptions  Pending Prescriptions Disp Refills   diazepam (VALIUM) 5 MG tablet [Pharmacy Med Name: diazePAM 5 MG TABLET] 60 tablet     Sig: TAKE ONE TABLET BY MOUTH TWICE A DAY AS NEEDED FOR ANXIETY     Not Delegated - Psychiatry: Anxiolytics/Hypnotics 2 Failed - 02/14/2022 11:10 AM      Failed - This refill cannot be delegated      Failed - Urine Drug Screen completed in last 360 days      Failed - Valid encounter within last 6 months    Recent Outpatient Visits           10 months ago Chronic pain of left knee   [place] [name], MD   1 year ago Bilateral chronic knee pain   [place] [facility], Cammie Mcgee, MD   1 year ago Bilateral chronic knee pain   [place] [name], MD   2 years ago Acute renal insufficiency   [place] [name], MD   2 years ago Benign essential HTN   [place], Cammie Mcgee, MD              Passed - Patient is not pregnant

## 2022-02-28 ENCOUNTER — Ambulatory Visit: Payer: 59 | Admitting: Family Medicine

## 2022-03-04 ENCOUNTER — Ambulatory Visit (INDEPENDENT_AMBULATORY_CARE_PROVIDER_SITE_OTHER): Payer: 59 | Admitting: Family Medicine

## 2022-03-04 ENCOUNTER — Other Ambulatory Visit: Payer: Self-pay | Admitting: Family Medicine

## 2022-03-04 VITALS — BP 118/72 | HR 94 | Temp 98.4°F | Ht 63.0 in | Wt 129.4 lb

## 2022-03-04 DIAGNOSIS — E7800 Pure hypercholesterolemia, unspecified: Secondary | ICD-10-CM | POA: Diagnosis not present

## 2022-03-04 LAB — COMPLETE METABOLIC PANEL WITH GFR
AG Ratio: 1.6 (calc) (ref 1.0–2.5)
ALT: 18 U/L (ref 6–29)
AST: 16 U/L (ref 10–35)
Albumin: 4.7 g/dL (ref 3.6–5.1)
Alkaline phosphatase (APISO): 76 U/L (ref 37–153)
BUN/Creatinine Ratio: 10 (calc) (ref 6–22)
BUN: 12 mg/dL (ref 7–25)
CO2: 27 mmol/L (ref 20–32)
Calcium: 10.6 mg/dL — ABNORMAL HIGH (ref 8.6–10.4)
Chloride: 101 mmol/L (ref 98–110)
Creat: 1.19 mg/dL — ABNORMAL HIGH (ref 0.50–1.05)
Globulin: 2.9 g/dL (calc) (ref 1.9–3.7)
Glucose, Bld: 120 mg/dL — ABNORMAL HIGH (ref 65–99)
Potassium: 4.4 mmol/L (ref 3.5–5.3)
Sodium: 139 mmol/L (ref 135–146)
Total Bilirubin: 0.4 mg/dL (ref 0.2–1.2)
Total Protein: 7.6 g/dL (ref 6.1–8.1)
eGFR: 52 mL/min/{1.73_m2} — ABNORMAL LOW (ref >=60)

## 2022-03-04 LAB — CBC WITH DIFFERENTIAL/PLATELET
Absolute Monocytes: 706 cells/uL (ref 200–950)
Basophils Absolute: 43 cells/uL (ref 0–200)
Basophils Relative: 0.4 %
Eosinophils Absolute: 32 cells/uL (ref 15–500)
Eosinophils Relative: 0.3 %
HCT: 39.7 % (ref 35.0–45.0)
Hemoglobin: 13.5 g/dL (ref 11.7–15.5)
Lymphs Abs: 2557 cells/uL (ref 850–3900)
MCH: 33 pg (ref 27.0–33.0)
MCHC: 34 g/dL (ref 32.0–36.0)
MCV: 97.1 fL (ref 80.0–100.0)
MPV: 9.3 fL (ref 7.5–12.5)
Monocytes Relative: 6.6 %
Neutro Abs: 7362 cells/uL (ref 1500–7800)
Neutrophils Relative %: 68.8 %
Platelets: 428 10*3/uL — ABNORMAL HIGH (ref 140–400)
RBC: 4.09 10*6/uL (ref 3.80–5.10)
RDW: 12.7 % (ref 11.0–15.0)
Total Lymphocyte: 23.9 %
WBC: 10.7 10*3/uL (ref 3.8–10.8)

## 2022-03-04 LAB — LIPID PANEL
Cholesterol: 212 mg/dL — ABNORMAL HIGH (ref <200)
HDL: 72 mg/dL (ref >=50)
LDL Cholesterol (Calc): 120 mg/dL (calc) — ABNORMAL HIGH
Non-HDL Cholesterol (Calc): 140 mg/dL (calc) — ABNORMAL HIGH (ref <130)
Total CHOL/HDL Ratio: 2.9 (calc) (ref <5.0)
Triglycerides: 102 mg/dL (ref <150)

## 2022-03-04 MED ORDER — ATORVASTATIN CALCIUM 40 MG PO TABS: 40.0000 mg | ORAL_TABLET | Freq: Every day | ORAL | 3 refills | Status: DC

## 2022-03-04 MED ORDER — TRAMADOL HCL 50 MG PO TABS: 50.0000 mg | ORAL_TABLET | Freq: Two times a day (BID) | ORAL | 0 refills | Status: DC

## 2022-03-04 NOTE — Progress Notes (Signed)
Subjective:    Patient ID: Erika Harvey, female    DOB: August 25, 1960, 61 y.o.   MRN: [id number]  Knee Pain   Patient presents today complaining of bilateral chronic knee pain.  The pain hurts primarily going up and down steps.  She has had 2 previous knee surgeries.  Both knees hurt equally.  She complains of pain underneath the patella bilaterally.  She denies any laxity to varus or valgus stress.  However she is unable to take NSAIDs due to a history of acute renal insufficiency while taking Celebrex.  She takes tramadol for pain.  Is been more than 3 months since she had a cortisone shot in each knee and she is requesting a repeat cortisone injection.  Of note, patient recently underwent a lumpectomy for breast cancer and also underwent radiation therapy Past Medical History:  Diagnosis Date   Allergy    Anxiety    Arthritis    Breast cancer ([place]) 07/19/2021   Chronic low back pain    Fibroid 03/30/2015   Frequent UTI    Hypertension    Vaginal bleeding 03/30/2015   Past Surgical History:  Procedure Laterality Date   BACK SURGERY  05-16-2015   BREAST LUMPECTOMY WITH RADIOACTIVE SEED AND SENTINEL LYMPH NODE BIOPSY Left 08/08/2021   Procedure: LEFT BREAST LUMPECTOMY WITH RADIOACTIVE SEED AND SENTINEL LYMPH NODE BIOPSY;  Surgeon: Coralie Keens, MD;  Location: [facility];  Service: General;  Laterality: Left;   KNEE SURGERY     left   KNEE SURGERY Left 07/08/2012   WISDOM TOOTH EXTRACTION     Current Outpatient Medications on File Prior to Visit  Medication Sig Dispense Refill   anastrozole (ARIMIDEX) 1 MG tablet Take 1 tablet (1 mg total) by mouth daily. 30 tablet 3   atenolol (TENORMIN) 25 MG tablet Take 1 tablet (25 mg total) by mouth daily. 90 tablet 2   atorvastatin (LIPITOR) 40 MG tablet Take 1 tablet (40 mg total) by mouth daily. 90 tablet 1   calcium-vitamin D (OSCAL WITH D) 250-125 MG-UNIT per tablet Take 1 tablet by mouth daily.     cyclobenzaprine  (FLEXERIL) 10 MG tablet TAKE ONE TABLET BY MOUTH THREE TIMES A DAY AS NEEDED FOR MUSCLE SPASMS 60 tablet 2   diazepam (VALIUM) 5 MG tablet TAKE ONE TABLET BY MOUTH TWICE A DAY AS NEEDED FOR ANXIETY 60 tablet 0   diclofenac Sodium (VOLTAREN) 1 % GEL Apply 2 g topically 4 (four) times daily. 100 g 5   DULoxetine (CYMBALTA) 60 MG capsule Take 1 capsule (60 mg total) by mouth daily. 90 capsule 3   fish oil-omega-3 fatty acids 1000 MG capsule Take 2 g by mouth 2 (two) times daily.     fluticasone (FLONASE) 50 MCG/ACT nasal spray Place 2 sprays into both nostrils daily. 16 g 6   losartan (COZAAR) 100 MG tablet TAKE ONE TABLET BY MOUTH DAILY 90 tablet 2   omeprazole (PRILOSEC) 20 MG capsule Take 1 capsule (20 mg total) by mouth daily. 90 capsule 3   zolpidem (AMBIEN) 10 MG tablet Take 1 tablet (10 mg total) by mouth at bedtime as needed for sleep. 30 tablet 1   No current facility-administered medications on file prior to visit.   Allergies  Allergen Reactions   Norvasc [Amlodipine Besylate] Swelling and Rash   Social History   Socioeconomic History   Marital status: Married    Spouse name: Not on file   Number of children: Not on  file   Years of education: Not on file   Highest education level: Not on file  Occupational History   Not on file  Tobacco Use   Smoking status: Never   Smokeless tobacco: Never  Vaping Use   Vaping Use: Never used  Substance and Sexual Activity   Alcohol use: Yes    Alcohol/week: 5.0 standard drinks of alcohol    Types: 5 Cans of beer per week   Drug use: No   Sexual activity: Yes    Birth control/protection: Pill  Other Topics Concern   Not on file  Social History Narrative   Not on file   Social Determinants of Health   Financial Resource Strain: Not on file  Food Insecurity: Not on file  Transportation Needs: Not on file  Physical Activity: Not on file  Stress: Not on file  Social Connections: Not on file  Intimate Partner Violence: Not on  file     Review of Systems  All other systems reviewed and are negative.      Objective:   Physical Exam Vitals reviewed.  Constitutional:      Appearance: Normal appearance.  Cardiovascular:     Rate and Rhythm: Normal rate and regular rhythm.     Heart sounds: Normal heart sounds.  Pulmonary:     Effort: Pulmonary effort is normal. No respiratory distress.     Breath sounds: Normal breath sounds. No stridor. No wheezing, rhonchi or rales.  Chest:     Chest wall: No tenderness.  Abdominal:     General: Abdomen is flat. Bowel sounds are normal.     Palpations: Abdomen is soft.  Musculoskeletal:     Right knee: Bony tenderness and crepitus present. Decreased range of motion. No LCL laxity, MCL laxity, ACL laxity or PCL laxity.     Left knee: Bony tenderness and crepitus present. Decreased range of motion. No LCL laxity, MCL laxity, ACL laxity or PCL laxity. Neurological:     Mental Status: She is alert.     Soft tissue swelling anterior to knee.       Assessment & Plan:  No diagnosis found.  Likely due to osteoarthritis in both knees.  Using sterile technique, I injected the right knee with 2 cc lidocaine, 2 cc of Marcaine, and 2 cc of 40 mg/mL Kenalog.  Then using sterile technique I injected the left knee with 2 cc lidocaine, 2 cc of Marcaine, and 2 cc of 40 mg/mL Kenalog.

## 2022-03-04 NOTE — Progress Notes (Signed)
Subjective:    Patient ID: Erika Harvey, female    DOB: Feb 29, 1961, 61 y.o.   MRN: [id number]  HPI Patient has been diagnosed with breast cancer.  This was treated with lumpectomy and radiation.  She is on anastrozole for the next 5 years for prevention of recurrence.  She has a history of hyperlipidemia for which she takes atorvastatin.  She is due to recheck cholesterol.  She has a history of essential hypertension which is currently being managed with a combination of atenolol as well as losartan.  She denies any chest pain shortness of breath or dyspnea on exertion.  She also has a history of degenerative disc disease in the lumbar spine as well as having a history of a herniated disc in her lumbar spine requiring surgery.  She uses tramadol twice a day for the low back pain.  She states that recently the low back pain has improved but now she is dealing with bilateral knee pain.  I saw the patient a month ago and gave her bilateral cortisone injections in both knees. Past Medical History:  Diagnosis Date   Allergy    Anxiety    Arthritis    Breast cancer ([place]) 07/19/2021   Chronic low back pain    Fibroid 03/30/2015   Frequent UTI    Hypertension    Vaginal bleeding 03/30/2015   Past Surgical History:  Procedure Laterality Date   BACK SURGERY  05-16-2015   BREAST LUMPECTOMY WITH RADIOACTIVE SEED AND SENTINEL LYMPH NODE BIOPSY Left 08/08/2021   Procedure: LEFT BREAST LUMPECTOMY WITH RADIOACTIVE SEED AND SENTINEL LYMPH NODE BIOPSY;  Surgeon: Coralie Keens, MD;  Location: [facility];  Service: General;  Laterality: Left;   KNEE SURGERY     left   KNEE SURGERY Left 07/08/2012   WISDOM TOOTH EXTRACTION     Current Outpatient Medications on File Prior to Visit  Medication Sig Dispense Refill   anastrozole (ARIMIDEX) 1 MG tablet Take 1 tablet (1 mg total) by mouth daily. 30 tablet 3   atenolol (TENORMIN) 25 MG tablet Take 1 tablet (25 mg total) by mouth daily. 90  tablet 2   atorvastatin (LIPITOR) 40 MG tablet Take 1 tablet (40 mg total) by mouth daily. 90 tablet 3   calcium-vitamin D (OSCAL WITH D) 250-125 MG-UNIT per tablet Take 1 tablet by mouth daily.     cyclobenzaprine (FLEXERIL) 10 MG tablet TAKE ONE TABLET BY MOUTH THREE TIMES A DAY AS NEEDED FOR MUSCLE SPASMS 60 tablet 2   diazepam (VALIUM) 5 MG tablet TAKE ONE TABLET BY MOUTH TWICE A DAY AS NEEDED FOR ANXIETY 60 tablet 0   diclofenac Sodium (VOLTAREN) 1 % GEL Apply 2 g topically 4 (four) times daily. 100 g 5   DULoxetine (CYMBALTA) 60 MG capsule Take 1 capsule (60 mg total) by mouth daily. 90 capsule 3   fish oil-omega-3 fatty acids 1000 MG capsule Take 2 g by mouth 2 (two) times daily.     fluticasone (FLONASE) 50 MCG/ACT nasal spray Place 2 sprays into both nostrils daily. 16 g 6   losartan (COZAAR) 100 MG tablet TAKE ONE TABLET BY MOUTH DAILY 90 tablet 2   omeprazole (PRILOSEC) 20 MG capsule Take 1 capsule (20 mg total) by mouth daily. 90 capsule 3   zolpidem (AMBIEN) 10 MG tablet Take 1 tablet (10 mg total) by mouth at bedtime as needed for sleep. 30 tablet 1   No current facility-administered medications on file prior to  visit.   Allergies  Allergen Reactions   Norvasc [Amlodipine Besylate] Swelling and Rash   Social History   Socioeconomic History   Marital status: Married    Spouse name: Not on file   Number of children: Not on file   Years of education: Not on file   Highest education level: Not on file  Occupational History   Not on file  Tobacco Use   Smoking status: Never   Smokeless tobacco: Never  Vaping Use   Vaping Use: Never used  Substance and Sexual Activity   Alcohol use: Yes    Alcohol/week: 5.0 standard drinks of alcohol    Types: 5 Cans of beer per week   Drug use: No   Sexual activity: Yes    Birth control/protection: Pill  Other Topics Concern   Not on file  Social History Narrative   Not on file   Social Determinants of Health   Financial  Resource Strain: Not on file  Food Insecurity: Not on file  Transportation Needs: Not on file  Physical Activity: Not on file  Stress: Not on file  Social Connections: Not on file  Intimate Partner Violence: Not on file     Review of Systems  All other systems reviewed and are negative.      Objective:   Physical Exam Vitals reviewed.  Constitutional:      Appearance: Normal appearance.  Cardiovascular:     Rate and Rhythm: Normal rate and regular rhythm.     Heart sounds: Normal heart sounds.  Pulmonary:     Effort: Pulmonary effort is normal. No respiratory distress.     Breath sounds: Normal breath sounds. No stridor. No wheezing, rhonchi or rales.  Chest:     Chest wall: No tenderness.  Abdominal:     General: Abdomen is flat. Bowel sounds are normal.     Palpations: Abdomen is soft.  Musculoskeletal:     Lumbar back: Spasms present. No tenderness or bony tenderness. Decreased range of motion.       Back:     Right knee: Bony tenderness present. No LCL laxity, MCL laxity, ACL laxity or PCL laxity.     Left knee: Bony tenderness present. No LCL laxity, MCL laxity, ACL laxity or PCL laxity. Neurological:     Mental Status: She is alert.           Assessment & Plan:  Pure hypercholesterolemia - Plan: CBC with Differential/Platelet, Lipid panel, COMPLETE METABOLIC PANEL WITH GFR I refilled the patient's tramadol.  She will take 60 tablets/month twice daily for knee pain and low back pain.  Also refilled her atorvastatin.  While the patient is here today I will check a CBC CMP and a lipid panel.  Goal LDL cholesterol is less than 100.  Blood pressure today is well controlled.

## 2022-03-06 ENCOUNTER — Telehealth: Payer: Self-pay | Admitting: Family Medicine

## 2022-03-06 NOTE — Telephone Encounter (Signed)
Patient left voicemail message; returned missed call to discuss lab results.   Please advise at [phone].

## 2022-03-08 NOTE — Telephone Encounter (Signed)
Spoke w/pt this morning regarding pt's lab results. Pt voiced understanding and nothing further.

## 2022-03-11 ENCOUNTER — Ambulatory Visit (INDEPENDENT_AMBULATORY_CARE_PROVIDER_SITE_OTHER): Payer: 59

## 2022-03-11 ENCOUNTER — Ambulatory Visit
Admission: EM | Admit: 2022-03-11 | Discharge: 2022-03-11 | Disposition: A | Discharge status: Home / Self Care | Payer: 59 | Attending: Family Medicine | Admitting: Family Medicine

## 2022-03-11 DIAGNOSIS — J90 Pleural effusion, not elsewhere classified: Secondary | ICD-10-CM | POA: Diagnosis not present

## 2022-03-11 DIAGNOSIS — S2231XA Fracture of one rib, right side, initial encounter for closed fracture: Secondary | ICD-10-CM

## 2022-03-11 DIAGNOSIS — S2241XA Multiple fractures of ribs, right side, initial encounter for closed fracture: Secondary | ICD-10-CM | POA: Diagnosis not present

## 2022-03-11 MED ORDER — HYDROCODONE-ACETAMINOPHEN 5-325 MG PO TABS: 1.0000 | ORAL_TABLET | Freq: Every evening | ORAL | 0 refills | Status: DC | PRN

## 2022-03-11 MED ORDER — CYCLOBENZAPRINE HCL 5 MG PO TABS: 5.0000 mg | ORAL_TABLET | Freq: Three times a day (TID) | ORAL | 0 refills | Status: DC | PRN

## 2022-03-11 NOTE — Discharge Instructions (Addendum)
Your x-rays today show mildly displaced fractures of the right fifth, seventh, eighth ribs and a small pleural effusion, fluid on your lung.  You should follow-up with orthopedics and primary care tomorrow and go to the emergency department if you start having trouble breathing at any point.  Currently, your breath sounds are normal and your oxygen saturation today is 100%.  These are very reassuring.  Limit your activity is much as possible.

## 2022-03-11 NOTE — ED Triage Notes (Signed)
Pt had fall from steps 4 days ago . Pt fell into bush but is having right rib pain

## 2022-03-11 NOTE — ED Provider Notes (Signed)
[facility]    CSN: [id number] Arrival date & time: 03/11/22  1117      History   Chief Complaint Chief Complaint  Patient presents with   Fall    HPI Erika Harvey is a 61 y.o. female.   Patient presenting today for evaluation of right rib pain following a fall that occurred 4 days ago.  She states she fell directly onto a bush off of her porch steps, hitting the anterior and lateral rib area on the right.  She also scraped the bridge of her nose but did not hit her head or lose consciousness.  No other injuries apparent from the fall.  She states breathing is incredibly painful as are any sort of movements.  She denies any difficulty breathing, palpitations, dizziness, nausea, vomiting after the incident.  Chronically takes tramadol 2 tabs twice daily and muscle relaxers, still having significant pain per patient.    Past Medical History:  Diagnosis Date   Allergy    Anxiety    Arthritis    Breast cancer ([place]) 07/19/2021   Chronic low back pain    Fibroid 03/30/2015   Frequent UTI    Hypertension    Vaginal bleeding 03/30/2015    Patient Active Problem List   Diagnosis Date Noted   Malignant neoplasm of upper-outer quadrant of left breast in female, estrogen receptor positive ([place]) 07/25/2021   Hypoglycemia 09/15/2018   Insomnia 09/07/2015   Osteopenia 08/09/2015   Vaginal bleeding 03/30/2015   Fibroid 03/30/2015   GERD (gastroesophageal reflux disease) 11/10/2014   Generalized OA 03/29/2014   Lumbar herniated disc 02/09/2014    Class: Chronic   Chronic pain syndrome 02/09/2014   DDD (degenerative disc disease), lumbar 02/09/2014   Spondylolysis 02/09/2014   Lumbosacral radiculitis 02/09/2014   Chronic low back pain    Hypertension    Anxiety    Frequent UTI     Past Surgical History:  Procedure Laterality Date   BACK SURGERY  05-16-2015   BREAST LUMPECTOMY WITH RADIOACTIVE SEED AND SENTINEL LYMPH NODE BIOPSY Left 08/08/2021   Procedure:  LEFT BREAST LUMPECTOMY WITH RADIOACTIVE SEED AND SENTINEL LYMPH NODE BIOPSY;  Surgeon: Coralie Keens, MD;  Location: [facility];  Service: General;  Laterality: Left;   KNEE SURGERY     left   KNEE SURGERY Left 07/08/2012   WISDOM TOOTH EXTRACTION      OB History     Gravida  0   Para  0   Term  0   Preterm  0   AB  0   Living  0      SAB  0   IAB  0   Ectopic  0   Multiple  0   Live Births               Home Medications    Prior to Admission medications   Medication Sig Start Date End Date Taking? Authorizing Provider  cyclobenzaprine (FLEXERIL) 5 MG tablet Take 1 tablet (5 mg total) by mouth 3 (three) times daily as needed for muscle spasms. Do not drink alcohol or drive while taking this medication.  May cause drowsiness. 03/11/22  Yes Volney American, PA-C  HYDROcodone-acetaminophen (NORCO/VICODIN) 5-325 MG tablet Take 1 tablet by mouth at bedtime as needed for moderate pain. 03/11/22  Yes Volney American, PA-C  anastrozole (ARIMIDEX) 1 MG tablet Take 1 tablet (1 mg total) by mouth daily. 02/11/22   Benay Pike, MD  atenolol (  TENORMIN) 25 MG tablet Take 1 tablet (25 mg total) by mouth daily. 07/03/21   [name], MD  atorvastatin (LIPITOR) 40 MG tablet Take 1 tablet (40 mg total) by mouth daily. 03/04/22   [name], MD  calcium-vitamin D (OSCAL WITH D) 250-125 MG-UNIT per tablet Take 1 tablet by mouth daily.    [provider]  cyclobenzaprine (FLEXERIL) 10 MG tablet TAKE ONE TABLET BY MOUTH THREE TIMES A DAY AS NEEDED FOR MUSCLE SPASMS 01/18/22   [name], MD  diazepam (VALIUM) 5 MG tablet TAKE ONE TABLET BY MOUTH TWICE A DAY AS NEEDED FOR ANXIETY 02/14/22   [name], MD  diclofenac Sodium (VOLTAREN) 1 % GEL Apply 2 g topically 4 (four) times daily. 07/08/19   [name], MD  DULoxetine (CYMBALTA) 60 MG capsule Take 1 capsule (60 mg total) by mouth daily. 03/09/21   [name],  MD  fish oil-omega-3 fatty acids 1000 MG capsule Take 2 g by mouth 2 (two) times daily.    [provider]  fluticasone (FLONASE) 50 MCG/ACT nasal spray Place 2 sprays into both nostrils daily. 11/05/21   [name], MD  losartan (COZAAR) 100 MG tablet TAKE ONE TABLET BY MOUTH DAILY 10/11/21   [name], MD  omeprazole (PRILOSEC) 20 MG capsule Take 1 capsule (20 mg total) by mouth daily. 10/11/21   [name], MD  traMADol (ULTRAM) 50 MG tablet Take 1 tablet (50 mg total) by mouth 2 (two) times daily. 03/04/22 04/03/22  [name], MD  zolpidem (AMBIEN) 10 MG tablet Take 1 tablet (10 mg total) by mouth at bedtime as needed for sleep. 09/17/21 10/17/21  [name], MD    Family History Family History  Problem Relation Age of Onset   Breast cancer Mother    Heart disease Father 25   Diabetes Father    Colon cancer Neg Hx    Colon polyps Neg Hx     Social History Social History   Tobacco Use   Smoking status: Never   Smokeless tobacco: Never  Vaping Use   Vaping Use: Never used  Substance Use Topics   Alcohol use: Yes    Alcohol/week: 5.0 standard drinks of alcohol    Types: 5 Cans of beer per week   Drug use: No     Allergies   Baclofen and Norvasc [amlodipine besylate]   Review of Systems Review of Systems Per HPI  Physical Exam Triage Vital Signs ED Triage Vitals  Enc Vitals Group     BP 03/11/22 1309 (!) 155/104     Pulse Rate 03/11/22 1309 81     Resp 03/11/22 1309 18     Temp 03/11/22 1309 97.6 F (36.4 C)     Temp src --      SpO2 03/11/22 1309 100 %     Weight --      Height --      Head Circumference --      Peak Flow --      Pain Score 03/11/22 1307 10     Pain Loc --      Pain Edu? --      Excl. in [place]? --    No data found.  Updated Vital Signs BP (!) 155/104 Comment: pt has not taken bp medication  Pulse 81   Temp 97.6 F (36.4 C)   Resp 18   LMP 03/23/2015   SpO2 100%  Visual Acuity Right Eye  Distance:   Left Eye Distance:   Bilateral Distance:    Right Eye Near:   Left Eye Near:    Bilateral Near:     Physical Exam Vitals and nursing note reviewed.  Constitutional:      Appearance: Normal appearance. She is not ill-appearing.  HENT:     Head: Atraumatic.  Eyes:     Extraocular Movements: Extraocular movements intact.     Conjunctiva/sclera: Conjunctivae normal.  Cardiovascular:     Rate and Rhythm: Normal rate and regular rhythm.     Heart sounds: Normal heart sounds.  Pulmonary:     Effort: Pulmonary effort is normal.     Breath sounds: Normal breath sounds. No wheezing or rales.     Comments: Chest rise symmetric bilaterally Musculoskeletal:        General: Tenderness and signs of injury present. No swelling. Normal range of motion.     Cervical back: Normal range of motion and neck supple.     Comments: Right lower anterior and lateral rib tenderness to palpation fairly diffusely in this region.  No appreciable bruising, edema to the area.  No obvious bony abnormalities on palpation  Skin:    General: Skin is warm and dry.  Neurological:     Mental Status: She is alert and oriented to person, place, and time.  Psychiatric:        Mood and Affect: Mood normal.        Thought Content: Thought content normal.        Judgment: Judgment normal.    UC Treatments / Results  Labs (all labs ordered are listed, but only abnormal results are displayed) Labs Reviewed - No data to display  EKG   Radiology DG Ribs Unilateral W/Chest Right  Result Date: 03/11/2022 CLINICAL DATA:  Right rib pain after fall yesterday. EXAM: RIGHT RIBS AND CHEST - 3+ VIEW COMPARISON:  None Available. FINDINGS: Mildly displaced fractures are seen involving the lateral portions of the right fifth, seventh and eighth ribs. There is no evidence of pneumothorax. Small right pleural effusion is noted. Both lungs are clear. Heart size and mediastinal contours are within normal limits.  IMPRESSION: Mildly displaced right fifth, seventh and eighth rib fractures. Small right pleural effusion is noted. Electronically Signed   By: Marijo Conception M.D.   On: 03/11/2022 13:46    Procedures Procedures (including critical care time)  Medications Ordered in UC Medications - No data to display  Initial Impression / Assessment and Plan / UC Course  I have reviewed the triage vital signs and the nursing notes.  Pertinent labs & imaging results that were available during my care of the patient were reviewed by me and considered in my medical decision making (see chart for details).     Patient in no acute distress today with 100% oxygen saturation on room air.  Her exam is very reassuring today, however her x-ray of the right ribs and chest reveals mildly displaced fractures of the right fifth, seventh and eighth ribs and a small right pleural effusion.  Unclear if this is acute as no previous chest x-rays to compare to.  Given the incident occurred 4 days ago, and she has not worsened or had any distress since we will have her follow-up tomorrow with orthopedics and or primary care for recheck.  Strict ED precautions given for any difficulty breathing, palpitations, chest pain, dizziness or other worsening symptoms.  She was placed in a  stabilizing wrap today as a rib belt was not available.  Very small supply of hydrocodone given for breakthrough pain at bedtime, muscle relaxers refilled, discussed heat, lidocaine patches and other supportive care. PDMP reviewed.  Final Clinical Impressions(s) / UC Diagnoses   Final diagnoses:  Closed traumatic displaced fracture of rib on right side, initial encounter  Pleural effusion     Discharge Instructions      Your x-rays today show mildly displaced fractures of the right fifth, seventh, eighth ribs and a small pleural effusion, fluid on your lung.  You should follow-up with orthopedics and primary care tomorrow and go to the emergency  department if you start having trouble breathing at any point.  Currently, your breath sounds are normal and your oxygen saturation today is 100%.  These are very reassuring.  Limit your activity is much as possible.    ED Prescriptions     Medication Sig Dispense Auth. Provider   HYDROcodone-acetaminophen (NORCO/VICODIN) 5-325 MG tablet Take 1 tablet by mouth at bedtime as needed for moderate pain. 5 tablet Volney American, PA-C   cyclobenzaprine (FLEXERIL) 5 MG tablet Take 1 tablet (5 mg total) by mouth 3 (three) times daily as needed for muscle spasms. Do not drink alcohol or drive while taking this medication.  May cause drowsiness. 15 tablet Volney American, [place]      I have reviewed the PDMP during this encounter.   Volney American, [place] 03/11/22 1526

## 2022-03-12 ENCOUNTER — Ambulatory Visit (INDEPENDENT_AMBULATORY_CARE_PROVIDER_SITE_OTHER): Payer: 59 | Admitting: Family Medicine

## 2022-03-12 VITALS — BP 132/80 | HR 105 | Temp 98.0°F | Ht 63.0 in | Wt 128.6 lb

## 2022-03-12 DIAGNOSIS — J90 Pleural effusion, not elsewhere classified: Secondary | ICD-10-CM

## 2022-03-12 NOTE — Progress Notes (Signed)
Subjective:    Patient ID: Erika Harvey, female    DOB: 10-09-1960, 61 y.o.   MRN: [id number]  Patient fell Friday going on a flight of steps in front of her house and suffered 3 rib fractures on the right side.  She went to a local urgent care with 3 rib fractures were confirmed however she did have a small right-sided pleural effusion.  Since that time she denies any fever or chills or hemoptysis or shortness of breath.  Today her breath sounds are equal and symmetric bilaterally.  There is no distant breath sounds.  There is no bibasilar crackles.  There is no evidence of pneumonia or increasing pleural effusion Past Medical History:  Diagnosis Date   Allergy    Anxiety    Arthritis    Breast cancer ([place]) 07/19/2021   Chronic low back pain    Fibroid 03/30/2015   Frequent UTI    Hypertension    Vaginal bleeding 03/30/2015   Past Surgical History:  Procedure Laterality Date   BACK SURGERY  05-16-2015   BREAST LUMPECTOMY WITH RADIOACTIVE SEED AND SENTINEL LYMPH NODE BIOPSY Left 08/08/2021   Procedure: LEFT BREAST LUMPECTOMY WITH RADIOACTIVE SEED AND SENTINEL LYMPH NODE BIOPSY;  Surgeon: Coralie Keens, MD;  Location: [facility];  Service: General;  Laterality: Left;   KNEE SURGERY     left   KNEE SURGERY Left 07/08/2012   WISDOM TOOTH EXTRACTION     Current Outpatient Medications on File Prior to Visit  Medication Sig Dispense Refill   anastrozole (ARIMIDEX) 1 MG tablet Take 1 tablet (1 mg total) by mouth daily. 30 tablet 3   atenolol (TENORMIN) 25 MG tablet Take 1 tablet (25 mg total) by mouth daily. 90 tablet 2   atorvastatin (LIPITOR) 40 MG tablet Take 1 tablet (40 mg total) by mouth daily. 90 tablet 3   calcium-vitamin D (OSCAL WITH D) 250-125 MG-UNIT per tablet Take 1 tablet by mouth daily.     cyclobenzaprine (FLEXERIL) 10 MG tablet TAKE ONE TABLET BY MOUTH THREE TIMES A DAY AS NEEDED FOR MUSCLE SPASMS 60 tablet 2   cyclobenzaprine (FLEXERIL) 5 MG tablet  Take 1 tablet (5 mg total) by mouth 3 (three) times daily as needed for muscle spasms. Do not drink alcohol or drive while taking this medication.  May cause drowsiness. 15 tablet 0   diazepam (VALIUM) 5 MG tablet TAKE ONE TABLET BY MOUTH TWICE A DAY AS NEEDED FOR ANXIETY 60 tablet 0   diclofenac Sodium (VOLTAREN) 1 % GEL Apply 2 g topically 4 (four) times daily. 100 g 5   DULoxetine (CYMBALTA) 60 MG capsule Take 1 capsule (60 mg total) by mouth daily. 90 capsule 3   fish oil-omega-3 fatty acids 1000 MG capsule Take 2 g by mouth 2 (two) times daily.     fluticasone (FLONASE) 50 MCG/ACT nasal spray Place 2 sprays into both nostrils daily. 16 g 6   HYDROcodone-acetaminophen (NORCO/VICODIN) 5-325 MG tablet Take 1 tablet by mouth at bedtime as needed for moderate pain. 5 tablet 0   losartan (COZAAR) 100 MG tablet TAKE ONE TABLET BY MOUTH DAILY 90 tablet 2   omeprazole (PRILOSEC) 20 MG capsule Take 1 capsule (20 mg total) by mouth daily. 90 capsule 3   traMADol (ULTRAM) 50 MG tablet Take 1 tablet (50 mg total) by mouth 2 (two) times daily. 60 tablet 0   zolpidem (AMBIEN) 10 MG tablet Take 1 tablet (10 mg total) by mouth at  bedtime as needed for sleep. 30 tablet 1   No current facility-administered medications on file prior to visit.   Allergies  Allergen Reactions   Baclofen    Norvasc [Amlodipine Besylate] Swelling and Rash   Social History   Socioeconomic History   Marital status: Married    Spouse name: Not on file   Number of children: Not on file   Years of education: Not on file   Highest education level: Not on file  Occupational History   Not on file  Tobacco Use   Smoking status: Never   Smokeless tobacco: Never  Vaping Use   Vaping Use: Never used  Substance and Sexual Activity   Alcohol use: Yes    Alcohol/week: 5.0 standard drinks of alcohol    Types: 5 Cans of beer per week   Drug use: No   Sexual activity: Yes    Birth control/protection: Pill  Other Topics Concern    Not on file  Social History Narrative   Not on file   Social Determinants of Health   Financial Resource Strain: Not on file  Food Insecurity: Not on file  Transportation Needs: Not on file  Physical Activity: Not on file  Stress: Not on file  Social Connections: Not on file  Intimate Partner Violence: Not on file     Review of Systems  All other systems reviewed and are negative.      Objective:   Physical Exam Vitals reviewed.  Constitutional:      Appearance: Normal appearance.  Cardiovascular:     Rate and Rhythm: Normal rate and regular rhythm.     Heart sounds: Normal heart sounds.  Pulmonary:     Effort: Pulmonary effort is normal. No respiratory distress.     Breath sounds: Normal breath sounds. No stridor. No wheezing, rhonchi or rales.  Chest:     Chest wall: No tenderness.  Abdominal:     General: Abdomen is flat. Bowel sounds are normal.     Palpations: Abdomen is soft.  Neurological:     Mental Status: She is alert.          Assessment & Plan:  Pleural effusion, right - Plan: DG Chest 2 View Aside from the pain that she is having with deep inspiration, there is no sign that the pleural effusion is increasing.  I believe the pleural effusion was a compensatory pleural effusion due to the trauma.  I recommended repeating a chest x-ray in 4 weeks to ensure resolution or immediately if she develops fevers, chills, or shortness of breath.  Recheck immediately if those symptoms develop

## 2022-03-13 ENCOUNTER — Other Ambulatory Visit: Payer: Self-pay | Admitting: Family Medicine

## 2022-03-13 ENCOUNTER — Telehealth: Payer: Self-pay

## 2022-03-13 NOTE — Telephone Encounter (Signed)
Pt called and states that "she came home and all of her prescription bottles had the tops off and were empty." Pt states that she does sleep walk and is unsure if she emptied them out in her sleep or what happed. Pt asks if refills on her medications can be sent into the pharmacy?

## 2022-03-13 NOTE — Telephone Encounter (Signed)
Patient called to request refill of   traMADol (ULTRAM) 50 MG tablet [[id number]]   Pharmacy confirmed as:  [facility] [id number] [place], [place] - [address]  [address] [place] [address]  Phone:  [phone]  Fax:  [phone]   LOV: 03/12/22  LAST REFILL: 03/04/22 (quantity: 60)  Patient stated she's taking 4 pills daily and doesn't have any pills left.  Please advise at [phone].

## 2022-03-13 NOTE — Telephone Encounter (Signed)
Erroneous encounter. Please disregard.

## 2022-03-14 MED ORDER — TRAMADOL HCL 50 MG PO TABS: 50.0000 mg | ORAL_TABLET | Freq: Two times a day (BID) | ORAL | 0 refills | Status: AC

## 2022-03-15 ENCOUNTER — Other Ambulatory Visit: Payer: Self-pay | Admitting: Family Medicine

## 2022-03-15 DIAGNOSIS — F419 Anxiety disorder, unspecified: Secondary | ICD-10-CM

## 2022-03-22 ENCOUNTER — Other Ambulatory Visit: Payer: Self-pay

## 2022-03-22 ENCOUNTER — Telehealth: Payer: Self-pay

## 2022-03-22 DIAGNOSIS — F320 Major depressive disorder, single episode, mild: Secondary | ICD-10-CM

## 2022-03-22 DIAGNOSIS — I10 Essential (primary) hypertension: Secondary | ICD-10-CM

## 2022-03-22 DIAGNOSIS — K219 Gastro-esophageal reflux disease without esophagitis: Secondary | ICD-10-CM

## 2022-03-22 DIAGNOSIS — G894 Chronic pain syndrome: Secondary | ICD-10-CM

## 2022-03-22 DIAGNOSIS — J302 Other seasonal allergic rhinitis: Secondary | ICD-10-CM

## 2022-03-22 DIAGNOSIS — R7989 Other specified abnormal findings of blood chemistry: Secondary | ICD-10-CM

## 2022-03-22 DIAGNOSIS — E7800 Pure hypercholesterolemia, unspecified: Secondary | ICD-10-CM

## 2022-03-22 DIAGNOSIS — F419 Anxiety disorder, unspecified: Secondary | ICD-10-CM

## 2022-03-22 MED ORDER — ATENOLOL 25 MG PO TABS: 25.0000 mg | ORAL_TABLET | Freq: Every day | ORAL | 2 refills | Status: DC

## 2022-03-22 MED ORDER — ATORVASTATIN CALCIUM 40 MG PO TABS: 40.0000 mg | ORAL_TABLET | Freq: Every day | ORAL | 3 refills | Status: DC

## 2022-03-22 MED ORDER — DICLOFENAC SODIUM 1 % EX GEL: 2.0000 g | Freq: Four times a day (QID) | CUTANEOUS | 5 refills | Status: AC

## 2022-03-22 MED ORDER — OMEGA-3 FATTY ACIDS 1000 MG PO CAPS: 2.0000 g | ORAL_CAPSULE | Freq: Two times a day (BID) | ORAL | 0 refills | Status: AC

## 2022-03-22 MED ORDER — DULOXETINE HCL 60 MG PO CPEP: 60.0000 mg | ORAL_CAPSULE | Freq: Every day | ORAL | 3 refills | Status: DC

## 2022-03-22 MED ORDER — FLUTICASONE PROPIONATE 50 MCG/ACT NA SUSP: 2.0000 | Freq: Every day | NASAL | 6 refills | Status: AC

## 2022-03-22 MED ORDER — OMEPRAZOLE 20 MG PO CPDR: 20.0000 mg | DELAYED_RELEASE_CAPSULE | Freq: Every day | ORAL | 3 refills | Status: DC

## 2022-03-22 NOTE — Telephone Encounter (Signed)
Pt called stating that she "have lost my medications again." Pt states that she found her diazepam, losartan and anastrozole but can not find her other medications. Pt asks if she can get a refill of enough of her other medications until her next refill is due? Thank you.

## 2022-03-25 ENCOUNTER — Ambulatory Visit: Payer: 59 | Admitting: Family Medicine

## 2022-03-25 NOTE — Telephone Encounter (Signed)
error 

## 2022-04-02 ENCOUNTER — Telehealth: Payer: Self-pay

## 2022-04-02 NOTE — Telephone Encounter (Signed)
Pharmacy faxed a refill request for cyclobenzaprine (FLEXERIL) 10 MG tablet [[id number]]    Order Details Dose, Route, Frequency: As Directed  Dispense Quantity: 60 tablet Refills: 2        Sig: TAKE ONE TABLET BY MOUTH THREE TIMES A DAY AS NEEDED FOR MUSCLE SPASMS       Start Date: 01/18/22 End Date: --  Written Date: 01/18/22 Expiration Date: 01/18/23  Original Order:  cyclobenzaprine (FLEXERIL) 10 MG tablet [[id number]]    LOV: 03/12/22  PHARMACY: [facility] [id number] [place], [place]

## 2022-04-04 ENCOUNTER — Other Ambulatory Visit: Payer: Self-pay | Admitting: Family Medicine

## 2022-04-04 MED ORDER — CYCLOBENZAPRINE HCL 10 MG PO TABS
ORAL_TABLET | ORAL | 2 refills | Status: DC
Start: 2022-04-04 — End: 2022-07-02

## 2022-04-11 ENCOUNTER — Other Ambulatory Visit: Payer: Self-pay | Admitting: Family Medicine

## 2022-04-11 DIAGNOSIS — F419 Anxiety disorder, unspecified: Secondary | ICD-10-CM

## 2022-04-12 NOTE — Telephone Encounter (Signed)
Requested medication (s) are due for refill today: yes  Requested medication (s) are on the active medication list: yes  Last refill:  03/15/22  Future visit scheduled: yes  Notes to clinic:  Unable to refill per protocol, cannot delegate Valium. Routing for approval. Patient last OV 03/12/22.     Requested Prescriptions  Pending Prescriptions Disp Refills   diazepam (VALIUM) 5 MG tablet [Pharmacy Med Name: diazePAM 5 MG TABLET] 60 tablet     Sig: TAKE ONE TABLET BY MOUTH TWICE A DAY AS NEEDED FOR ANXIETY     Not Delegated - Psychiatry: Anxiolytics/Hypnotics 2 Failed - 04/11/2022  5:24 PM      Failed - This refill cannot be delegated      Failed - Urine Drug Screen completed in last 360 days      Failed - Valid encounter within last 6 months    Recent Outpatient Visits           1 year ago Chronic pain of left knee   [place] [name], MD   1 year ago Bilateral chronic knee pain   [place] [name], MD   2 years ago Bilateral chronic knee pain   [facility], Cammie Mcgee, MD   2 years ago Acute renal insufficiency   [place] [name], MD   3 years ago Benign essential HTN   [place], Cammie Mcgee, MD              Passed - Patient is not pregnant

## 2022-05-02 ENCOUNTER — Telehealth: Payer: Self-pay

## 2022-05-02 NOTE — Telephone Encounter (Signed)
Pharmacy faxed a refill or prescription renewal for: traMADol (ULTRAM) 50 MG tablet [[id number]]  ENDED   Order Details Dose: 50 mg Route: Oral Frequency: Every 6 hours PRN  Dispense Quantity: 120 tablet Refills: 0        Sig: Take 1 tablet (50 mg total) by mouth every 6 (six) hours as needed.       Start Date: 12/17/21 End Date: 01/16/22  Written Date: 12/17/21 Expiration Date: 06/15/22      LOV: 03/25/22     PHARMACY: [facility] [id number] [place], [place]

## 2022-05-03 ENCOUNTER — Other Ambulatory Visit: Payer: Self-pay | Admitting: Family Medicine

## 2022-05-03 MED ORDER — TRAMADOL HCL 100 MG PO TABS: 1.0000 | ORAL_TABLET | Freq: Four times a day (QID) | ORAL | 0 refills | Status: DC | PRN

## 2022-05-14 ENCOUNTER — Other Ambulatory Visit: Payer: Self-pay | Admitting: Family Medicine

## 2022-06-03 DIAGNOSIS — Z833 Family history of diabetes mellitus: Secondary | ICD-10-CM | POA: Diagnosis not present

## 2022-06-03 DIAGNOSIS — K219 Gastro-esophageal reflux disease without esophagitis: Secondary | ICD-10-CM | POA: Diagnosis not present

## 2022-06-03 DIAGNOSIS — C50919 Malignant neoplasm of unspecified site of unspecified female breast: Secondary | ICD-10-CM | POA: Diagnosis not present

## 2022-06-03 DIAGNOSIS — G8929 Other chronic pain: Secondary | ICD-10-CM | POA: Diagnosis not present

## 2022-06-03 DIAGNOSIS — I10 Essential (primary) hypertension: Secondary | ICD-10-CM | POA: Diagnosis not present

## 2022-06-03 DIAGNOSIS — Z818 Family history of other mental and behavioral disorders: Secondary | ICD-10-CM | POA: Diagnosis not present

## 2022-06-03 DIAGNOSIS — J309 Allergic rhinitis, unspecified: Secondary | ICD-10-CM | POA: Diagnosis not present

## 2022-06-03 DIAGNOSIS — Z803 Family history of malignant neoplasm of breast: Secondary | ICD-10-CM | POA: Diagnosis not present

## 2022-06-03 DIAGNOSIS — G4700 Insomnia, unspecified: Secondary | ICD-10-CM | POA: Diagnosis not present

## 2022-06-03 DIAGNOSIS — M545 Low back pain, unspecified: Secondary | ICD-10-CM | POA: Diagnosis not present

## 2022-06-03 DIAGNOSIS — Z87891 Personal history of nicotine dependence: Secondary | ICD-10-CM | POA: Diagnosis not present

## 2022-06-03 DIAGNOSIS — Z8249 Family history of ischemic heart disease and other diseases of the circulatory system: Secondary | ICD-10-CM | POA: Diagnosis not present

## 2022-06-07 ENCOUNTER — Telehealth: Payer: Self-pay

## 2022-06-07 NOTE — Telephone Encounter (Signed)
Pt called stating that the Ambien extended release has a higher co-pay and would like the regular Ambien to be sent in instead. Thank you.

## 2022-06-10 ENCOUNTER — Other Ambulatory Visit: Payer: Self-pay | Admitting: Family Medicine

## 2022-06-10 MED ORDER — ZOLPIDEM TARTRATE 10 MG PO TABS: 10.0000 mg | ORAL_TABLET | Freq: Every evening | ORAL | 0 refills | Status: DC | PRN

## 2022-06-11 ENCOUNTER — Telehealth: Payer: Self-pay | Admitting: Hematology and Oncology

## 2022-06-11 NOTE — Telephone Encounter (Signed)
Called patient to r/s upcoming appointment. Patient r/s and notified.

## 2022-06-12 ENCOUNTER — Other Ambulatory Visit: Payer: Self-pay | Admitting: Family Medicine

## 2022-06-12 ENCOUNTER — Telehealth: Payer: Self-pay | Admitting: Hematology and Oncology

## 2022-06-12 ENCOUNTER — Other Ambulatory Visit: Payer: Self-pay | Admitting: *Deleted

## 2022-06-12 ENCOUNTER — Ambulatory Visit
Admission: RE | Admit: 2022-06-12 | Discharge: 2022-06-12 | Disposition: A | Discharge status: Home / Self Care | Payer: 59 | Source: Ambulatory Visit | Attending: Hematology and Oncology | Admitting: Hematology and Oncology

## 2022-06-12 DIAGNOSIS — Z853 Personal history of malignant neoplasm of breast: Secondary | ICD-10-CM | POA: Diagnosis not present

## 2022-06-12 DIAGNOSIS — Z170 Estrogen receptor positive status [ER+]: Secondary | ICD-10-CM

## 2022-06-12 HISTORY — DX: Personal history of irradiation: Z92.3

## 2022-06-12 MED ORDER — ANASTROZOLE 1 MG PO TABS: 1.0000 mg | ORAL_TABLET | Freq: Every day | ORAL | 3 refills | Status: DC

## 2022-06-12 NOTE — Telephone Encounter (Signed)
R/s patient due to provider schedule. Patient is notified of new appointment information.

## 2022-06-12 NOTE — Telephone Encounter (Signed)
Patient called - requested refill of Anastrozole.

## 2022-06-27 ENCOUNTER — Ambulatory Visit: Payer: 59 | Admitting: Hematology and Oncology

## 2022-06-28 ENCOUNTER — Other Ambulatory Visit: Payer: Self-pay | Admitting: Family Medicine

## 2022-06-28 MED ORDER — CYCLOBENZAPRINE HCL 5 MG PO TABS: 5.0000 mg | ORAL_TABLET | Freq: Three times a day (TID) | ORAL | 0 refills | Status: DC | PRN

## 2022-07-02 ENCOUNTER — Telehealth: Payer: Self-pay

## 2022-07-02 ENCOUNTER — Other Ambulatory Visit: Payer: Self-pay

## 2022-07-02 DIAGNOSIS — M5136 Other intervertebral disc degeneration, lumbar region: Secondary | ICD-10-CM

## 2022-07-02 DIAGNOSIS — G894 Chronic pain syndrome: Secondary | ICD-10-CM

## 2022-07-02 MED ORDER — CYCLOBENZAPRINE HCL 10 MG PO TABS: ORAL_TABLET | ORAL | 2 refills | Status: DC

## 2022-07-02 NOTE — Progress Notes (Unsigned)
[facility] Follow up:    Erika Frizzle, Erika Harvey [address] [place] [address]   DIAGNOSIS:  Cancer Staging  Malignant neoplasm of upper-outer quadrant of left breast in female, estrogen receptor positive ([place]) Staging form: Breast, AJCC 8th Edition - Clinical stage from 07/19/2021: Stage IA (cT1b, cN0, cM0, G2, ER+, PR+, HER2-) - Signed by Gardenia Phlegm, NP on 07/25/2021 Stage prefix: Initial diagnosis Histologic grading system: 3 grade system - Pathologic stage from 08/08/2021: Stage IA (pT1b, pN0, cM0, G3, ER+, PR+, HER2-, Oncotype DX score: 28) - Signed by Gardenia Phlegm, NP on 07/02/2022 Stage prefix: Initial diagnosis Multigene prognostic tests performed: Oncotype DX Recurrence score range: Greater than or equal to 11 Histologic grading system: 3 grade system   SUMMARY OF ONCOLOGIC HISTORY: Oncology History  Malignant neoplasm of upper-outer quadrant of left breast in female, estrogen receptor positive ([place])  07/19/2021 Initial Diagnosis   Left breast biopsy at 2:30 o'clock: ILC, grade 2, ER 100%, PR 40%, Ki-67 10%, HER2 negative   07/19/2021 Cancer Staging   Staging form: Breast, AJCC 8th Edition - Clinical stage from 07/19/2021: Stage IA (cT1b, cN0, cM0, G2, ER+, PR+, HER2-) - Signed by Gardenia Phlegm, NP on 07/25/2021 Stage prefix: Initial diagnosis Histologic grading system: 3 grade system   08/08/2021 Surgery   Left breast lumpectomy: ILC 0.7cm, g 3, margins negative, 4 SLN negative   08/08/2021 Oncotype testing   Oncotype score results are as 28, distant recurrence risk at 9 years with tamoxifen alone is 17% and chemotherapy benefit was deemed greater than 15%. Risks and benefits of adjuvant chemotherapy reviewed and patient opted to forego treatment.    09/13/2021 - 10/11/2021 Radiation Therapy   Site Technique Total Dose (Gy) Dose per Fx (Gy) Completed Fx Beam Energies  Breast, Left: Breast_L 3D 42.56/42.56 2.66  16/16 6XFFF  Breast, Left: Breast_L_Bst 3D 8/8 2 4/4 6X     11/2021 -  Anti-estrogen oral therapy   Anastrozole daily     CURRENT THERAPY:  INTERVAL HISTORY: Erika Harvey 61 y.o. female returns for f/u of her history of estrogen positive breast cancer.  She continues on Anastrozole daily  Her most recent mammogram occurred on 06/12/2022 and demonstrated no mammographic evidence of malignancy and breast density category B.    Patient Active Problem List   Diagnosis Date Noted   Malignant neoplasm of upper-outer quadrant of left breast in female, estrogen receptor positive ([place]) 07/25/2021   Hypoglycemia 09/15/2018   Insomnia 09/07/2015   Osteopenia 08/09/2015   Vaginal bleeding 03/30/2015   Fibroid 03/30/2015   GERD (gastroesophageal reflux disease) 11/10/2014   Generalized OA 03/29/2014   Lumbar herniated disc 02/09/2014    Class: Chronic   Chronic pain syndrome 02/09/2014   DDD (degenerative disc disease), lumbar 02/09/2014   Spondylolysis 02/09/2014   Lumbosacral radiculitis 02/09/2014   Chronic low back pain    Hypertension    Anxiety    Frequent UTI     is allergic to baclofen and norvasc [amlodipine besylate].  MEDICAL HISTORY: Past Medical History:  Diagnosis Date   Allergy    Anxiety    Arthritis    Breast cancer ([place]) 07/19/2021   Chronic low back pain    Fibroid 03/30/2015   Frequent UTI    Hypertension    Personal history of radiation therapy    Vaginal bleeding 03/30/2015    SURGICAL HISTORY: Past Surgical History:  Procedure Laterality Date   BACK  SURGERY  05/16/2015   BREAST BIOPSY Left 07/19/2021   BREAST LUMPECTOMY Left 08/08/2021   BREAST LUMPECTOMY WITH RADIOACTIVE SEED AND SENTINEL LYMPH NODE BIOPSY Left 08/08/2021   Procedure: LEFT BREAST LUMPECTOMY WITH RADIOACTIVE SEED AND SENTINEL LYMPH NODE BIOPSY;  Surgeon: Coralie Keens, Erika Harvey;  Location: [facility];  Service: General;  Laterality: Left;   KNEE SURGERY      left   KNEE SURGERY Left 07/08/2012   WISDOM TOOTH EXTRACTION      SOCIAL HISTORY: Social History   Socioeconomic History   Marital status: Married    Spouse name: Not on file   Number of children: Not on file   Years of education: Not on file   Highest education level: Not on file  Occupational History   Not on file  Tobacco Use   Smoking status: Never   Smokeless tobacco: Never  Vaping Use   Vaping Use: Never used  Substance and Sexual Activity   Alcohol use: Yes    Alcohol/week: 5.0 standard drinks of alcohol    Types: 5 Cans of beer per week   Drug use: No   Sexual activity: Yes    Birth control/protection: Pill  Other Topics Concern   Not on file  Social History Narrative   Not on file   Social Determinants of Health   Financial Resource Strain: Not on file  Food Insecurity: Not on file  Transportation Needs: Not on file  Physical Activity: Not on file  Stress: Not on file  Social Connections: Not on file  Intimate Partner Violence: Not on file    FAMILY HISTORY: Family History  Problem Relation Age of Onset   Breast cancer Mother    Heart disease Father 10   Diabetes Father    Colon cancer Neg Hx    Colon polyps Neg Hx     Review of Systems  Constitutional:  Negative for appetite change, chills, fatigue, fever and unexpected weight change.  HENT:   Negative for hearing loss, lump/mass and trouble swallowing.   Eyes:  Negative for eye problems and icterus.  Respiratory:  Negative for chest tightness, cough and shortness of breath.   Cardiovascular:  Negative for chest pain, leg swelling and palpitations.  Gastrointestinal:  Negative for abdominal distention, abdominal pain, constipation, diarrhea, nausea and vomiting.  Endocrine: Negative for hot flashes.  Genitourinary:  Negative for difficulty urinating.   Musculoskeletal:  Negative for arthralgias.  Skin:  Negative for itching and rash.  Neurological:  Negative for dizziness, extremity  weakness, headaches and numbness.  Hematological:  Negative for adenopathy. Does not bruise/bleed easily.  Psychiatric/Behavioral:  Negative for depression. The patient is not nervous/anxious.       PHYSICAL EXAMINATION  ECOG PERFORMANCE STATUS: {CHL ONC ECOG [phone]}  There were no vitals filed for this visit.  Physical Exam Constitutional:      General: She is not in acute distress.    Appearance: Normal appearance. She is not toxic-appearing.  HENT:     Head: Normocephalic and atraumatic.  Eyes:     General: No scleral icterus. Cardiovascular:     Rate and Rhythm: Normal rate and regular rhythm.     Pulses: Normal pulses.     Heart sounds: Normal heart sounds.  Pulmonary:     Effort: Pulmonary effort is normal.     Breath sounds: Normal breath sounds.  Chest:     Comments: Left breast s/p lumpectomy and radiation, no sign of local recurrence, right breast benign.  Abdominal:     General: Abdomen is flat. Bowel sounds are normal. There is no distension.     Palpations: Abdomen is soft.     Tenderness: There is no abdominal tenderness.  Musculoskeletal:        General: No swelling.     Cervical back: Neck supple.  Lymphadenopathy:     Cervical: No cervical adenopathy.  Skin:    General: Skin is warm and dry.     Findings: No rash.  Neurological:     General: No focal deficit present.     Mental Status: She is alert.  Psychiatric:        Mood and Affect: Mood normal.        Behavior: Behavior normal.     LABORATORY DATA:  CBC    Component Value Date/Time   WBC 10.7 03/04/2022 1135   RBC 4.09 03/04/2022 1135   HGB 13.5 03/04/2022 1135   HCT 39.7 03/04/2022 1135   PLT 428 (H) 03/04/2022 1135   MCV 97.1 03/04/2022 1135   MCH 33.0 03/04/2022 1135   MCHC 34.0 03/04/2022 1135   RDW 12.7 03/04/2022 1135   LYMPHSABS 2,557 03/04/2022 1135   MONOABS 891 10/16/2015 0958   EOSABS 32 03/04/2022 1135   BASOSABS 43 03/04/2022 1135    CMP     Component  Value Date/Time   NA 139 03/04/2022 1135   K 4.4 03/04/2022 1135   CL 101 03/04/2022 1135   CO2 27 03/04/2022 1135   GLUCOSE 120 (H) 03/04/2022 1135   BUN 12 03/04/2022 1135   CREATININE 1.19 (H) 03/04/2022 1135   CALCIUM 10.6 (H) 03/04/2022 1135   PROT 7.6 03/04/2022 1135   ALBUMIN 4.4 10/24/2015 0850   AST 16 03/04/2022 1135   ALT 18 03/04/2022 1135   ALKPHOS 56 10/24/2015 0850   BILITOT 0.4 03/04/2022 1135   GFRNONAA 62 07/08/2019 1103   GFRAA 71 07/08/2019 1103        ASSESSMENT and THERAPY PLAN:   No problem-specific Assessment & Plan notes found for this encounter.    All questions were answered. The patient knows to call the clinic with any problems, questions or concerns. We can certainly see the patient much sooner if necessary.  Total encounter time:*** minutes*in face-to-face visit time, chart review, lab review, care coordination, order entry, and documentation of the encounter time.    Wilber Bihari, NP 07/02/22 2:53 PM Medical Oncology and Hematology [facility] [place], [place] [address] Tel. [phone]    Fax. [phone]  *Total Encounter Time as defined by the Centers for Medicare and Medicaid Services includes, in addition to the face-to-face time of a patient visit (documented in the note above) non-face-to-face time: obtaining and reviewing outside history, ordering and reviewing medications, tests or procedures, care coordination (communications with other health care professionals or caregivers) and documentation in the medical record.

## 2022-07-02 NOTE — Telephone Encounter (Signed)
Prescription Request  07/02/2022  Is this a "Controlled Substance" medicine? Yes  LOV: 03/25/22 What is the name of the medication or equipment? cyclobenzaprine (FLEXERIL) 10 MG tablet [[id number]]   Have you contacted your pharmacy to request a refill? Yes   Which pharmacy would you like this sent to?  [facility] [id number] [place] [place] [address] Phone: [phone] Fax: [phone]    Patient notified that their request is being sent to the clinical staff for review and that they should receive a response within 2 business days.   Please advise at [facility] [phone]

## 2022-07-03 ENCOUNTER — Inpatient Hospital Stay: Payer: 59 | Attending: Hematology and Oncology | Admitting: Adult Health

## 2022-07-03 ENCOUNTER — Other Ambulatory Visit: Payer: Self-pay

## 2022-07-03 ENCOUNTER — Encounter: Payer: Self-pay | Admitting: Adult Health

## 2022-07-03 VITALS — BP 144/80 | HR 100 | Temp 97.9°F | Resp 16 | Ht 63.0 in | Wt 133.2 lb

## 2022-07-03 DIAGNOSIS — C50412 Malignant neoplasm of upper-outer quadrant of left female breast: Secondary | ICD-10-CM | POA: Insufficient documentation

## 2022-07-03 DIAGNOSIS — Z79811 Long term (current) use of aromatase inhibitors: Secondary | ICD-10-CM | POA: Insufficient documentation

## 2022-07-03 DIAGNOSIS — M8580 Other specified disorders of bone density and structure, unspecified site: Secondary | ICD-10-CM | POA: Insufficient documentation

## 2022-07-03 DIAGNOSIS — Z923 Personal history of irradiation: Secondary | ICD-10-CM | POA: Diagnosis not present

## 2022-07-03 DIAGNOSIS — Z170 Estrogen receptor positive status [ER+]: Secondary | ICD-10-CM | POA: Insufficient documentation

## 2022-07-03 NOTE — Patient Instructions (Signed)
Fezolinetant Tablets What is this medication? FEZOLINETANT (FEZ oh LIN e tant) reduces the number and severity of hot flashes due to menopause. It works by blocking substances in your body that cause hot flashes and night sweats. This medicine may be used for other purposes; ask your health care provider or pharmacist if you have questions. COMMON BRAND NAME(S): VEOZAH What should I tell my care team before I take this medication? They need to know if you have any of these conditions: Kidney disease Liver disease An unusual or allergic reaction to fezolinetant, other medications, foods, dyes, or preservatives Pregnant or trying to get pregnant Breastfeeding How should I use this medication? Take this medication by mouth with water. Take it as directed on the prescription label at the same time every day. Do not cut, crush, or chew this medication. Swallow the tablets whole. You can take it with or without food. If it upsets your stomach, take it with food. Keep taking it unless your care team tells you to stop. Talk to your care team about the use of this medication in children. Special care may be needed. Overdosage: If you think you have taken too much of this medicine contact a poison control center or emergency room at once. NOTE: This medicine is only for you. Do not share this medicine with others. What if I miss a dose? If you miss a dose, take it as soon as you can unless it is more than 12 hours late. If it is more than 12 hours late, skip the missed dose. Take the next dose at the normal time. What may interact with this medication? Other medications may affect the way this medication works. Talk with your care team about all of the medications you take. They may suggest changes to your treatment plan to lower the risk of side effects and to make sure your medications work as intended. This list may not describe all possible interactions. Give your health care provider a list of all  the medicines, herbs, non-prescription drugs, or dietary supplements you use. Also tell them if you smoke, drink alcohol, or use illegal drugs. Some items may interact with your medicine. What should I watch for while using this medication? Visit your care team for regular checks on your progress. Tell your care team if your symptoms do not start to get better or if they get worse. You may need blood work while taking this medication. What side effects may I notice from receiving this medication? Side effects that you should report to your care team as soon as possible: Allergic reactions--skin rash, itching, hives, swelling of the face, lips, tongue, or throat Liver injury--right upper belly pain, loss of appetite, nausea, light-colored stool, dark yellow or brown urine, yellowing skin or eyes, unusual weakness or fatigue Side effects that usually do not require medical attention (report these to your care team if they continue or are bothersome): Back pain Diarrhea Hot flashes Stomach pain Trouble sleeping This list may not describe all possible side effects. Call your doctor for medical advice about side effects. You may report side effects to FDA at 1-800-FDA-1088. Where should I keep my medication? Keep out of the reach of children and pets. Store at room temperature between 20 and 25 degrees C (68 and 77 degrees F). Get rid of any unused medication after the expiration date. To get rid of medications that are no longer needed or have expired: Take the medication to a medication take-back program. Check with   your pharmacy or law enforcement to find a location. If you cannot return the medication, check the label or package insert to see if the medication should be thrown out in the garbage or flushed down the toilet. If you are not sure, ask your care team. If it is safe to put it in the trash, take the medication out of the container. Mix the medication with cat litter, dirt, coffee  grounds, or other unwanted substance. Seal the mixture in a bag or container. Put it in the trash. NOTE: This sheet is a summary. It may not cover all possible information. If you have questions about this medicine, talk to your doctor, pharmacist, or health care provider.  2023 Elsevier/Gold Standard (2021-11-29 00:00:00)  

## 2022-07-03 NOTE — Assessment & Plan Note (Signed)
Erika Harvey is a 61 year old woman with history of stage Ia ER/PR positive breast cancer that was diagnosed in January 2023 status post lumpectomy, adjuvant radiation, and antiestrogen therapy with anastrozole daily which began in May 2023.  W has no clinical or radiographic signs of breast cancer recurrence.  She will continue on anastrozole daily which she is tolerating well.  I recommended that she continue annual diagnostic breast mammograms next due in December 2024.  [name] is at risk for bone mineral loss due to her anastrozole therapy along with being postmenopausal.  She is scheduled to undergo bone density testing in March 2024.  [name] is experiencing some hot flashes and we briefly discussed the Veozah.  I gave her information about this medication or her AVS.  If hot flashes increase when it starts to get warmer, but she knows she can call me and we will send in a prescription.  I recommended that she continue healthy diet, exercise, and following up with her primary care provider regularly.  She will return in 6 months for follow-up with myself or Dr. Chryl Heck.

## 2022-07-04 ENCOUNTER — Ambulatory Visit: Payer: 59 | Admitting: Hematology and Oncology

## 2022-07-04 ENCOUNTER — Other Ambulatory Visit: Payer: Self-pay | Admitting: Family Medicine

## 2022-07-04 DIAGNOSIS — M5136 Other intervertebral disc degeneration, lumbar region: Secondary | ICD-10-CM

## 2022-07-04 DIAGNOSIS — G894 Chronic pain syndrome: Secondary | ICD-10-CM

## 2022-07-04 MED ORDER — CYCLOBENZAPRINE HCL 10 MG PO TABS: ORAL_TABLET | ORAL | 2 refills | Status: DC

## 2022-07-12 ENCOUNTER — Other Ambulatory Visit: Payer: Self-pay | Admitting: Family Medicine

## 2022-07-12 ENCOUNTER — Ambulatory Visit: Payer: 59 | Admitting: Hematology and Oncology

## 2022-07-12 MED ORDER — TRAMADOL HCL 50 MG PO TABS: 50.0000 mg | ORAL_TABLET | Freq: Four times a day (QID) | ORAL | 0 refills | Status: DC | PRN

## 2022-07-12 NOTE — Telephone Encounter (Signed)
Requested medication (s) are due for refill today: routing for approval  Requested medication (s) are on the active medication listyes  Last refill:  06/13/22  Future visit scheduled: yes  Notes to clinic:  Unable to refill per protocol, cannot delegate.      Requested Prescriptions  Pending Prescriptions Disp Refills   traMADol (ULTRAM) 50 MG tablet [Pharmacy Med Name: traMADol HCL '50MG'$  TABLET] 240 tablet     Sig: TAKE TWO TABLETS BY MOUTH FOUR TIMES A DAY AS NEEDED     Not Delegated - Analgesics:  Opioid Agonists Failed - 07/12/2022  3:58 PM      Failed - This refill cannot be delegated      Failed - Urine Drug Screen completed in last 360 days      Failed - Valid encounter within last 3 months    Recent Outpatient Visits           1 year ago Chronic pain of left knee   [place] [name], MD   1 year ago Bilateral chronic knee pain   [place] [name], MD   2 years ago Bilateral chronic knee pain   [place] Dennard Schaumann, Cammie Mcgee, MD   3 years ago Acute renal insufficiency   [place] [name], MD   3 years ago Benign essential HTN   [place] Pickard, Cammie Mcgee, MD

## 2022-07-15 ENCOUNTER — Other Ambulatory Visit: Payer: Self-pay | Admitting: Family Medicine

## 2022-07-15 DIAGNOSIS — I10 Essential (primary) hypertension: Secondary | ICD-10-CM

## 2022-07-16 NOTE — Telephone Encounter (Signed)
Requested Prescriptions  Pending Prescriptions Disp Refills   losartan (COZAAR) 100 MG tablet [Pharmacy Med Name: LOSARTAN POTASSIUM 100 MG TAB] 90 tablet 0    Sig: TAKE ONE TABLET BY MOUTH DAILY     Cardiovascular:  Angiotensin Receptor Blockers Failed - 07/15/2022 11:48 AM      Failed - Cr in normal range and within 180 days    Creat  Date Value Ref Range Status  03/04/2022 1.19 (H) 0.50 - 1.05 mg/dL Final         Failed - Last BP in normal range    BP Readings from Last 1 Encounters:  07/03/22 (!) 144/80         Failed - Valid encounter within last 6 months    Recent Outpatient Visits           1 year ago Chronic pain of left knee   [place] [name], MD   1 year ago Bilateral chronic knee pain   [place] Dennard Schaumann, Cammie Mcgee, MD   2 years ago Bilateral chronic knee pain   [place] Dennard Schaumann, Cammie Mcgee, MD   3 years ago Acute renal insufficiency   [place] [name], MD   3 years ago Benign essential HTN   [place], Cammie Mcgee, MD              Passed - K in normal range and within 180 days    Potassium  Date Value Ref Range Status  03/04/2022 4.4 3.5 - 5.3 mmol/L Final         Passed - Patient is not pregnant

## 2022-07-23 ENCOUNTER — Other Ambulatory Visit: Payer: Self-pay | Admitting: Family Medicine

## 2022-07-23 ENCOUNTER — Encounter: Payer: Self-pay | Admitting: Hematology and Oncology

## 2022-07-23 DIAGNOSIS — F419 Anxiety disorder, unspecified: Secondary | ICD-10-CM

## 2022-07-24 NOTE — Telephone Encounter (Signed)
Requested medication (s) are due for refill today - yes  Requested medication (s) are on the active medication list -yes  Future visit scheduled -no  Last refill: 04/15/22 #60 2RF  Notes to clinic: non delegated Rx  Requested Prescriptions  Pending Prescriptions Disp Refills   diazepam (VALIUM) 5 MG tablet [Pharmacy Med Name: diazePAM 5 MG TABLET] 60 tablet     Sig: TAKE ONE TABLET BY MOUTH TWICE A DAY AS NEEDED FOR ANXIETY     Not Delegated - Psychiatry: Anxiolytics/Hypnotics 2 Failed - 07/23/2022  6:02 PM      Failed - This refill cannot be delegated      Failed - Urine Drug Screen completed in last 360 days      Failed - Valid encounter within last 6 months    Recent Outpatient Visits           1 year ago Chronic pain of left knee   [place] [name], MD   1 year ago Bilateral chronic knee pain   [place] Dennard Schaumann, Cammie Mcgee, MD   2 years ago Bilateral chronic knee pain   [place] Dennard Schaumann, Cammie Mcgee, MD   3 years ago Acute renal insufficiency   [place] Dennard Schaumann, Cammie Mcgee, MD   3 years ago Benign essential HTN   [place], Cammie Mcgee, MD              Passed - Patient is not pregnant         Requested Prescriptions  Pending Prescriptions Disp Refills   diazepam (VALIUM) 5 MG tablet [Pharmacy Med Name: diazePAM 5 MG TABLET] 60 tablet     Sig: TAKE ONE TABLET BY MOUTH TWICE A DAY AS NEEDED FOR ANXIETY     Not Delegated - Psychiatry: Anxiolytics/Hypnotics 2 Failed - 07/23/2022  6:02 PM      Failed - This refill cannot be delegated      Failed - Urine Drug Screen completed in last 360 days      Failed - Valid encounter within last 6 months    Recent Outpatient Visits           1 year ago Chronic pain of left knee   [place] [name], MD   1 year ago Bilateral chronic knee pain   [place] [name], MD   2 years ago Bilateral chronic knee pain   [place] Dennard Schaumann, Cammie Mcgee, MD   3 years ago Acute renal insufficiency   [place] [name], MD   3 years ago Benign essential HTN   [place], Cammie Mcgee, MD              Passed - Patient is not pregnant

## 2022-08-14 ENCOUNTER — Other Ambulatory Visit: Payer: Self-pay | Admitting: Family Medicine

## 2022-08-15 NOTE — Telephone Encounter (Signed)
Requested medication (s) are due for refill today: yes  Requested medication (s) are on the active medication list: yes  Last refill:  multiple dates  Future visit scheduled: yes  Notes to clinic:  Unable to refill per protocol, cannot delegate.      Requested Prescriptions  Pending Prescriptions Disp Refills   zolpidem (AMBIEN) 10 MG tablet [Pharmacy Med Name: ZOLPIDEM TARTRATE 10 MG TABLET] 30 tablet     Sig: TAKE ONE TABLET BY MOUTH EVERY NIGHT AT BEDTIME AS NEEDED FOR SLEEP     Not Delegated - Psychiatry:  Anxiolytics/Hypnotics Failed - 08/14/2022  2:46 PM      Failed - This refill cannot be delegated      Failed - Urine Drug Screen completed in last 360 days      Failed - Valid encounter within last 6 months    Recent Outpatient Visits           1 year ago Chronic pain of left knee   [place] [name], MD   1 year ago Bilateral chronic knee pain   [place] Dennard Schaumann, Cammie Mcgee, MD   2 years ago Bilateral chronic knee pain   [place] Dennard Schaumann, Cammie Mcgee, MD   3 years ago Acute renal insufficiency   [place] [name], MD   3 years ago Benign essential HTN   [place] Pickard, Cammie Mcgee, MD               traMADol (ULTRAM) 50 MG tablet [Pharmacy Med Name: traMADol HCL '50MG'$  TABLET] 120 tablet     Sig: TAKE ONE TABLET BY MOUTH EVERY 6 HOURS AS NEEDED. REDUCING BACK TO 120 TABLETS FROM 240     Not Delegated - Analgesics:  Opioid Agonists Failed - 08/14/2022  2:46 PM      Failed - This refill cannot be delegated      Failed - Urine Drug Screen completed in last 360 days      Failed - Valid encounter within last 3 months    Recent Outpatient Visits           1 year ago Chronic pain of left knee   [name] [name], MD   1 year ago Bilateral chronic knee pain   [place] [name], MD   2 years ago  Bilateral chronic knee pain   [place] Dennard Schaumann, Cammie Mcgee, MD   3 years ago Acute renal insufficiency   [place] [name], MD   3 years ago Benign essential HTN   [place] Pickard, Cammie Mcgee, MD

## 2022-08-23 ENCOUNTER — Other Ambulatory Visit: Payer: Self-pay | Admitting: Family Medicine

## 2022-08-23 DIAGNOSIS — F419 Anxiety disorder, unspecified: Secondary | ICD-10-CM

## 2022-09-03 ENCOUNTER — Encounter: Payer: Self-pay | Admitting: Family Medicine

## 2022-09-03 ENCOUNTER — Ambulatory Visit (INDEPENDENT_AMBULATORY_CARE_PROVIDER_SITE_OTHER): Payer: 59 | Admitting: Family Medicine

## 2022-09-03 VITALS — BP 120/76 | HR 81 | Temp 97.6°F | Ht 63.0 in | Wt 130.0 lb

## 2022-09-03 DIAGNOSIS — I10 Essential (primary) hypertension: Secondary | ICD-10-CM

## 2022-09-03 DIAGNOSIS — M25562 Pain in left knee: Secondary | ICD-10-CM | POA: Diagnosis not present

## 2022-09-03 DIAGNOSIS — E7800 Pure hypercholesterolemia, unspecified: Secondary | ICD-10-CM | POA: Diagnosis not present

## 2022-09-03 DIAGNOSIS — M25561 Pain in right knee: Secondary | ICD-10-CM

## 2022-09-03 DIAGNOSIS — N1831 Chronic kidney disease, stage 3a: Secondary | ICD-10-CM

## 2022-09-03 NOTE — Progress Notes (Signed)
Subjective:    Patient ID: Erika Harvey, female    DOB: 08/16/1960, 62 y.o.   MRN: [id number]  Knee Pain   Patient presents today complaining of bilateral chronic knee pain.  The pain hurts primarily going up and down steps.  She has had 2 previous knee surgeries.  Both knees hurt equally.  She complains of pain underneath the patella bilaterally.  She denies any laxity to varus or valgus stress.  It has been more than 3 months since her last knee injections.  She gets significant benefit from the cortisone injections and so she is requesting that I repeat them today.  She also has a history of chronic low back pain for which she takes tramadol.  She takes 2 tablets in the morning and usually 2 tablets in the evening.  Therefore she is taking 4 pills a day.  This equates to 120 tablets/month.  She is also on 2 Valium pills a day as muscle relaxers and for anxiety.  She denies any uncontrolled pain.  She is not taking her cholesterol medication.  She states that after stopping the cholesterol medication, she saw no benefit with regards to her muscle and muscle aches.  She has a history of chronic kidney disease.  For this reason she is not on NSAIDs.  Her last GFR was less than 60 putting her at stage IIIa chronic kidney disease.  She is due to check a urine protein creatinine ratio.  Her blood pressure today is well-controlled. Past Medical History:  Diagnosis Date   Allergy    Anxiety    Arthritis    Breast cancer ([place]) 07/19/2021   Chronic low back pain    Fibroid 03/30/2015   Frequent UTI    Hypertension    Personal history of radiation therapy    Vaginal bleeding 03/30/2015   Past Surgical History:  Procedure Laterality Date   BACK SURGERY  05/16/2015   BREAST BIOPSY Left 07/19/2021   BREAST LUMPECTOMY Left 08/08/2021   BREAST LUMPECTOMY WITH RADIOACTIVE SEED AND SENTINEL LYMPH NODE BIOPSY Left 08/08/2021   Procedure: LEFT BREAST LUMPECTOMY WITH RADIOACTIVE SEED AND SENTINEL  LYMPH NODE BIOPSY;  Surgeon: Coralie Keens, MD;  Location: [facility];  Service: General;  Laterality: Left;   KNEE SURGERY     left   KNEE SURGERY Left 07/08/2012   WISDOM TOOTH EXTRACTION     Current Outpatient Medications on File Prior to Visit  Medication Sig Dispense Refill   anastrozole (ARIMIDEX) 1 MG tablet Take 1 tablet (1 mg total) by mouth daily. 30 tablet 3   atenolol (TENORMIN) 25 MG tablet Take 1 tablet (25 mg total) by mouth daily. 90 tablet 2   calcium-vitamin D (OSCAL WITH D) 250-125 MG-UNIT per tablet Take 1 tablet by mouth daily.     cyclobenzaprine (FLEXERIL) 10 MG tablet TAKE ONE TABLET BY MOUTH THREE TIMES A DAY AS NEEDED FOR MUSCLE SPASMS 60 tablet 2   diazepam (VALIUM) 5 MG tablet TAKE ONE TABLET BY MOUTH TWICE A DAY AS NEEDED FOR ANXIETY 60 tablet 0   diclofenac Sodium (VOLTAREN) 1 % GEL Apply 2 g topically 4 (four) times daily. 100 g 5   DULoxetine (CYMBALTA) 60 MG capsule Take 1 capsule (60 mg total) by mouth daily. 90 capsule 3   fish oil-omega-3 fatty acids 1000 MG capsule Take 2 capsules (2 g total) by mouth 2 (two) times daily. 14 capsule 0   fluticasone (FLONASE) 50 MCG/ACT nasal spray Place  2 sprays into both nostrils daily. 16 g 6   losartan (COZAAR) 100 MG tablet TAKE ONE TABLET BY MOUTH DAILY 90 tablet 0   omeprazole (PRILOSEC) 20 MG capsule Take 1 capsule (20 mg total) by mouth daily. 90 capsule 3   traMADol (ULTRAM) 50 MG tablet TAKE ONE TABLET BY MOUTH EVERY 6 HOURS AS NEEDED. REDUCING BACK TO 120 TABLETS FROM 240 120 tablet 0   zolpidem (AMBIEN) 10 MG tablet TAKE ONE TABLET BY MOUTH EVERY NIGHT AT BEDTIME AS NEEDED FOR SLEEP 30 tablet 0   No current facility-administered medications on file prior to visit.   Allergies  Allergen Reactions   Baclofen    Norvasc [Amlodipine Besylate] Swelling and Rash   Social History   Socioeconomic History   Marital status: Married    Spouse name: Not on file   Number of children: Not on file    Years of education: Not on file   Highest education level: Not on file  Occupational History   Not on file  Tobacco Use   Smoking status: Never   Smokeless tobacco: Never  Vaping Use   Vaping Use: Never used  Substance and Sexual Activity   Alcohol use: Yes    Alcohol/week: 5.0 standard drinks of alcohol    Types: 5 Cans of beer per week   Drug use: No   Sexual activity: Yes    Birth control/protection: Pill  Other Topics Concern   Not on file  Social History Narrative   Not on file   Social Determinants of Health   Financial Resource Strain: Not on file  Food Insecurity: Not on file  Transportation Needs: Not on file  Physical Activity: Not on file  Stress: Not on file  Social Connections: Not on file  Intimate Partner Violence: Not on file     Review of Systems  All other systems reviewed and are negative.      Objective:   Physical Exam Vitals reviewed.  Constitutional:      Appearance: Normal appearance.  Cardiovascular:     Rate and Rhythm: Normal rate and regular rhythm.     Heart sounds: Normal heart sounds.  Pulmonary:     Effort: Pulmonary effort is normal. No respiratory distress.     Breath sounds: Normal breath sounds. No stridor. No wheezing, rhonchi or rales.  Chest:     Chest wall: No tenderness.  Abdominal:     General: Abdomen is flat. Bowel sounds are normal.     Palpations: Abdomen is soft.  Musculoskeletal:     Right knee: Bony tenderness and crepitus present. Decreased range of motion. No LCL laxity, MCL laxity, ACL laxity or PCL laxity.     Left knee: Bony tenderness and crepitus present. Decreased range of motion. No LCL laxity, MCL laxity, ACL laxity or PCL laxity. Neurological:     Mental Status: She is alert.     Soft tissue swelling anterior to knee.       Assessment & Plan:  Primary hypertension - Plan: CBC with Differential/Platelet, Lipid panel, COMPLETE METABOLIC PANEL WITH GFR  Pure hypercholesterolemia - Plan:  CBC with Differential/Platelet, Lipid panel, COMPLETE METABOLIC PANEL WITH GFR  Stage 3a chronic kidney disease ([place]) - Plan: CBC with Differential/Platelet, Lipid panel, COMPLETE METABOLIC PANEL WITH GFR, Protein / Creatinine Ratio, Urine I am very happy with her blood pressure.  I will check a fasting lipid panel.  Ideally I would like her LDL cholesterol to be under 130.  If  is not under 130 I will likely resume the statin.  Check a urine protein to creatinine ratio.  If elevated, we may consider adding Farxiga for chronic kidney disease.  If the urine protein creatinine ratio is normal, she is already on maximum dose losartan.  Her blood pressure is less than 130/80.  She is avoiding NSAIDs.  Therefore I feel that we have optimized the management of her chronic kidney disease.  Regarding her knee pain, I feel that it is likely due to osteoarthritis in both knees.  Using sterile technique, I injected the right knee with 2 cc lidocaine, 2 cc of Marcaine, and 2 cc of 40 mg/mL Kenalog.  Then using sterile technique I injected the left knee with 2 cc lidocaine, 2 cc of Marcaine, and 2 cc of 40 mg/mL Kenalog.

## 2022-09-04 LAB — LIPID PANEL
Cholesterol: 254 mg/dL — ABNORMAL HIGH (ref <200)
HDL: 62 mg/dL (ref >=50)
LDL Cholesterol (Calc): 160 mg/dL (calc) — ABNORMAL HIGH
Non-HDL Cholesterol (Calc): 192 mg/dL (calc) — ABNORMAL HIGH (ref <130)
Total CHOL/HDL Ratio: 4.1 (calc) (ref <5.0)
Triglycerides: 168 mg/dL — ABNORMAL HIGH (ref <150)

## 2022-09-04 LAB — COMPLETE METABOLIC PANEL WITH GFR
AG Ratio: 1.8 (calc) (ref 1.0–2.5)
ALT: 12 U/L (ref 6–29)
AST: 13 U/L (ref 10–35)
Albumin: 4.7 g/dL (ref 3.6–5.1)
Alkaline phosphatase (APISO): 78 U/L (ref 37–153)
BUN: 16 mg/dL (ref 7–25)
CO2: 26 mmol/L (ref 20–32)
Calcium: 10.1 mg/dL (ref 8.6–10.4)
Chloride: 103 mmol/L (ref 98–110)
Creat: 0.92 mg/dL (ref 0.50–1.05)
Globulin: 2.6 g/dL (calc) (ref 1.9–3.7)
Glucose, Bld: 108 mg/dL — ABNORMAL HIGH (ref 65–99)
Potassium: 3.9 mmol/L (ref 3.5–5.3)
Sodium: 141 mmol/L (ref 135–146)
Total Bilirubin: 0.3 mg/dL (ref 0.2–1.2)
Total Protein: 7.3 g/dL (ref 6.1–8.1)
eGFR: 70 mL/min/{1.73_m2} (ref >=60)

## 2022-09-04 LAB — CBC WITH DIFFERENTIAL/PLATELET
Absolute Monocytes: 512 cells/uL (ref 200–950)
Basophils Absolute: 38 cells/uL (ref 0–200)
Basophils Relative: 0.6 %
Eosinophils Absolute: 58 cells/uL (ref 15–500)
Eosinophils Relative: 0.9 %
HCT: 36.3 % (ref 35.0–45.0)
Hemoglobin: 12.2 g/dL (ref 11.7–15.5)
Lymphs Abs: 1619 cells/uL (ref 850–3900)
MCH: 32.3 pg (ref 27.0–33.0)
MCHC: 33.6 g/dL (ref 32.0–36.0)
MCV: 96 fL (ref 80.0–100.0)
MPV: 10.1 fL (ref 7.5–12.5)
Monocytes Relative: 8 %
Neutro Abs: 4173 cells/uL (ref 1500–7800)
Neutrophils Relative %: 65.2 %
Platelets: 341 10*3/uL (ref 140–400)
RBC: 3.78 10*6/uL — ABNORMAL LOW (ref 3.80–5.10)
RDW: 11.7 % (ref 11.0–15.0)
Total Lymphocyte: 25.3 %
WBC: 6.4 10*3/uL (ref 3.8–10.8)

## 2022-09-04 LAB — PROTEIN / CREATININE RATIO, URINE
Creatinine, Urine: 104 mg/dL (ref 20–275)
Protein/Creat Ratio: 77 mg/g creat (ref 24–184)
Protein/Creatinine Ratio: 0.077 mg/mg creat (ref 0.024–0.184)
Total Protein, Urine: 8 mg/dL (ref 5–24)

## 2022-09-05 ENCOUNTER — Encounter: Payer: Self-pay | Admitting: Radiology

## 2022-09-05 ENCOUNTER — Other Ambulatory Visit: Payer: Self-pay

## 2022-09-05 DIAGNOSIS — Z008 Encounter for other general examination: Secondary | ICD-10-CM | POA: Diagnosis not present

## 2022-09-05 DIAGNOSIS — I10 Essential (primary) hypertension: Secondary | ICD-10-CM | POA: Diagnosis not present

## 2022-09-05 DIAGNOSIS — C50919 Malignant neoplasm of unspecified site of unspecified female breast: Secondary | ICD-10-CM | POA: Diagnosis not present

## 2022-09-05 DIAGNOSIS — Z833 Family history of diabetes mellitus: Secondary | ICD-10-CM | POA: Diagnosis not present

## 2022-09-05 DIAGNOSIS — Z87891 Personal history of nicotine dependence: Secondary | ICD-10-CM | POA: Diagnosis not present

## 2022-09-05 DIAGNOSIS — G4700 Insomnia, unspecified: Secondary | ICD-10-CM | POA: Diagnosis not present

## 2022-09-05 DIAGNOSIS — M62838 Other muscle spasm: Secondary | ICD-10-CM | POA: Diagnosis not present

## 2022-09-05 DIAGNOSIS — J309 Allergic rhinitis, unspecified: Secondary | ICD-10-CM | POA: Diagnosis not present

## 2022-09-05 DIAGNOSIS — K219 Gastro-esophageal reflux disease without esophagitis: Secondary | ICD-10-CM | POA: Diagnosis not present

## 2022-09-05 DIAGNOSIS — F3341 Major depressive disorder, recurrent, in partial remission: Secondary | ICD-10-CM | POA: Diagnosis not present

## 2022-09-05 DIAGNOSIS — F419 Anxiety disorder, unspecified: Secondary | ICD-10-CM | POA: Diagnosis not present

## 2022-09-05 DIAGNOSIS — Z8249 Family history of ischemic heart disease and other diseases of the circulatory system: Secondary | ICD-10-CM | POA: Diagnosis not present

## 2022-09-05 DIAGNOSIS — Z79811 Long term (current) use of aromatase inhibitors: Secondary | ICD-10-CM | POA: Diagnosis not present

## 2022-09-05 MED ORDER — ATORVASTATIN CALCIUM 20 MG PO TABS: 20.0000 mg | ORAL_TABLET | Freq: Every day | ORAL | 3 refills | Status: DC

## 2022-09-10 DIAGNOSIS — Z01419 Encounter for gynecological examination (general) (routine) without abnormal findings: Secondary | ICD-10-CM | POA: Diagnosis not present

## 2022-09-10 DIAGNOSIS — Z6823 Body mass index (BMI) 23.0-23.9, adult: Secondary | ICD-10-CM | POA: Diagnosis not present

## 2022-09-11 ENCOUNTER — Other Ambulatory Visit: Payer: Self-pay | Admitting: Family Medicine

## 2022-09-22 ENCOUNTER — Other Ambulatory Visit: Payer: Self-pay | Admitting: Family Medicine

## 2022-09-22 DIAGNOSIS — F419 Anxiety disorder, unspecified: Secondary | ICD-10-CM

## 2022-09-23 ENCOUNTER — Other Ambulatory Visit: Payer: Self-pay | Admitting: Hematology and Oncology

## 2022-09-23 DIAGNOSIS — C50412 Malignant neoplasm of upper-outer quadrant of left female breast: Secondary | ICD-10-CM

## 2022-09-25 ENCOUNTER — Other Ambulatory Visit: Payer: 59

## 2022-10-03 ENCOUNTER — Other Ambulatory Visit: Payer: Self-pay

## 2022-10-03 DIAGNOSIS — Z170 Estrogen receptor positive status [ER+]: Secondary | ICD-10-CM

## 2022-10-03 MED ORDER — ANASTROZOLE 1 MG PO TABS: 1.0000 mg | ORAL_TABLET | Freq: Every day | ORAL | 3 refills | Status: DC

## 2022-10-03 NOTE — Telephone Encounter (Signed)
Returned Pt's call regarding anastrozole refill request. Rx refilled per Wilber Bihari, DNP last note and Pt verbalized understanding.

## 2022-10-11 ENCOUNTER — Other Ambulatory Visit: Payer: Self-pay | Admitting: Family Medicine

## 2022-10-11 NOTE — Telephone Encounter (Signed)
Requested medications are due for refill today.  Unsure  Requested medications are on the active medications list.  yes  Last refill. 09/12/2022 #120 0 rf  Future visit scheduled.   no  Notes to clinic.  Refill not delegated.    Requested Prescriptions  Pending Prescriptions Disp Refills   traMADol (ULTRAM) 50 MG tablet [Pharmacy Med Name: traMADol HCL 50MG  TABLET] 120 tablet     Sig: TAKE 1 TABLET BY MOUTH EVERY 6 HOURS AS NEEDED     Not Delegated - Analgesics:  Opioid Agonists Failed - 10/11/2022  2:45 PM      Failed - This refill cannot be delegated      Failed - Urine Drug Screen completed in last 360 days      Failed - Valid encounter within last 3 months    Recent Outpatient Visits           1 year ago Chronic pain of left knee   [facility] Donita Brooks, MD   2 years ago Bilateral chronic knee pain   [facility] Donita Brooks, MD   2 years ago Bilateral chronic knee pain   [facility] Tanya Nones, Priscille Heidelberg, MD   3 years ago Acute renal insufficiency   [facility] Donita Brooks, MD   3 years ago Benign essential HTN   [facility] Pickard, Priscille Heidelberg, MD

## 2022-10-12 ENCOUNTER — Other Ambulatory Visit: Payer: Self-pay | Admitting: Family Medicine

## 2022-10-12 DIAGNOSIS — I10 Essential (primary) hypertension: Secondary | ICD-10-CM

## 2022-10-14 NOTE — Telephone Encounter (Signed)
Requested Prescriptions  Pending Prescriptions Disp Refills   losartan (COZAAR) 100 MG tablet [Pharmacy Med Name: LOSARTAN POTASSIUM 100 MG TAB] 90 tablet 0    Sig: TAKE 1 TABLET BY MOUTH DAILY     Cardiovascular:  Angiotensin Receptor Blockers Failed - 10/12/2022  6:51 AM      Failed - Valid encounter within last 6 months    Recent Outpatient Visits           1 year ago Chronic pain of left knee   [facility] Donita Brooks, MD   2 years ago Bilateral chronic knee pain   [facility] Tanya Nones, Priscille Heidelberg, MD   2 years ago Bilateral chronic knee pain   [facility] Tanya Nones, Priscille Heidelberg, MD   3 years ago Acute renal insufficiency   [facility] Donita Brooks, MD   3 years ago Benign essential HTN   [facility] Pickard, Priscille Heidelberg, MD              Passed - Cr in normal range and within 180 days    Creat  Date Value Ref Range Status  09/03/2022 0.92 0.50 - 1.05 mg/dL Final   Creatinine, Urine  Date Value Ref Range Status  09/03/2022 104 20 - 275 mg/dL Final         Passed - K in normal range and within 180 days    Potassium  Date Value Ref Range Status  09/03/2022 3.9 3.5 - 5.3 mmol/L Final         Passed - Patient is not pregnant      Passed - Last BP in normal range    BP Readings from Last 1 Encounters:  09/03/22 120/76

## 2022-10-23 ENCOUNTER — Ambulatory Visit: Payer: Self-pay | Admitting: *Deleted

## 2022-10-23 NOTE — Patient Outreach (Signed)
  Care Coordination   Collaboration with pcp  Visit Note   10/23/2022 Name: Erika Harvey MRN: [id number] DOB: 07-01-1961  Erika Harvey is a 62 y.o. year old female who sees Pickard, Erika Heidelberg, MD for primary care. I  was informed of patient outreach to [facility] office by office staff, (Erika Harvey) Outreach to patient's coverage customer service staff. Collaborated with Erika Harvey at [place]   What matters to the patients health and wellness today?  Access to see pcp under present insurance coverage E-mail message to [facility] leadership and representative at [employer] at [place] provided contact number to offer pcp staff with this concern It was provided to the the office staff for follow up    Goals Addressed   None     SDOH assessments and interventions completed:  No     Care Coordination Interventions:  Yes, provided   Follow up plan: No further intervention required.   Encounter Outcome:  Pt. Visit Completed   Erika Harvey L. Noelle Penner, RN, BSN, CCM [facility] Care Management Community Coordinator Office number [phone]

## 2022-11-01 ENCOUNTER — Other Ambulatory Visit: Payer: Self-pay | Admitting: Family Medicine

## 2022-11-21 ENCOUNTER — Telehealth: Payer: Self-pay

## 2022-11-21 NOTE — Telephone Encounter (Signed)
Pt called in to ask if pcp if he would send in a med for nausea. Pt states that she has been feeling sick on her stomach all night. Please advise  Cb#: [phone]

## 2022-11-25 ENCOUNTER — Ambulatory Visit (INDEPENDENT_AMBULATORY_CARE_PROVIDER_SITE_OTHER): Payer: 59

## 2022-11-25 ENCOUNTER — Ambulatory Visit
Admission: EM | Admit: 2022-11-25 | Discharge: 2022-11-25 | Disposition: A | Discharge status: Home / Self Care | Payer: 59 | Attending: Nurse Practitioner | Admitting: Nurse Practitioner

## 2022-11-25 DIAGNOSIS — K219 Gastro-esophageal reflux disease without esophagitis: Secondary | ICD-10-CM | POA: Diagnosis not present

## 2022-11-25 DIAGNOSIS — K5900 Constipation, unspecified: Secondary | ICD-10-CM

## 2022-11-25 DIAGNOSIS — R110 Nausea: Secondary | ICD-10-CM | POA: Diagnosis not present

## 2022-11-25 MED ORDER — OMEPRAZOLE 20 MG PO CPDR: 20.0000 mg | DELAYED_RELEASE_CAPSULE | Freq: Every day | ORAL | 0 refills | Status: DC

## 2022-11-25 MED ORDER — SENNOSIDES-DOCUSATE SODIUM 8.6-50 MG PO TABS: 1.0000 | ORAL_TABLET | Freq: Every evening | ORAL | 0 refills | Status: AC | PRN

## 2022-11-25 MED ORDER — ONDANSETRON 4 MG PO TBDP: 4.0000 mg | ORAL_TABLET | Freq: Three times a day (TID) | ORAL | 0 refills | Status: DC | PRN

## 2022-11-25 MED ORDER — POLYETHYLENE GLYCOL 3350 17 G PO PACK: PACK | ORAL | 0 refills | Status: AC

## 2022-11-25 NOTE — ED Triage Notes (Addendum)
Pt c/o nausea x 1 week. Pt states she has been able to keep solids and liquids down, but feels sick to her stomach all day.   Pt states she hasn't been pass gas. Last BM was 2-3 days ago.

## 2022-11-25 NOTE — ED Provider Notes (Signed)
[facility]    CSN: [id number] Arrival date & time: 11/25/22  1103      History   Chief Complaint No chief complaint on file.   HPI Erika Harvey is a 62 y.o. female.   The history is provided by the patient.   The patient presents for complaints of nausea that is been present for the past week.  Patient also complains of intermittent right lower quadrant abdominal pain.  Patient denies fever, chills, chest pain, vomiting, diarrhea, flatulence, bloating, back pain, or urinary symptoms.  Patient states that she has noticed nausea anytime after she eats.  She states that she did try to eat tomato soup, but that she cannot tolerate it.  She states after that she did eat chicken noodle soup which she was able to eat.  Patient reports that she was given a previous prescription of omeprazole, but she states that she did not start taking it.  Patient reports that she does drink a lot of fluids.  Patient reports that she is unable to pass gas.  She reports her last bowel movement was approximately 2 to 3 days ago.  Patient reports that she does take tramadol.  Past Medical History:  Diagnosis Date   Allergy    Anxiety    Arthritis    Breast cancer ([facility]) 07/19/2021   Chronic low back pain    Fibroid 03/30/2015   Frequent UTI    Hypertension    Personal history of radiation therapy    Vaginal bleeding 03/30/2015    Patient Active Problem List   Diagnosis Date Noted   Malignant neoplasm of upper-outer quadrant of left breast in female, estrogen receptor positive (HCC) 07/25/2021   Hypoglycemia 09/15/2018   Insomnia 09/07/2015   Osteopenia 08/09/2015   Vaginal bleeding 03/30/2015   Fibroid 03/30/2015   GERD (gastroesophageal reflux disease) 11/10/2014   Generalized OA 03/29/2014   Lumbar herniated disc 02/09/2014    Class: Chronic   Chronic pain syndrome 02/09/2014   DDD (degenerative disc disease), lumbar 02/09/2014   Spondylolysis 02/09/2014   Lumbosacral  radiculitis 02/09/2014   Chronic low back pain    Hypertension    Anxiety    Frequent UTI     Past Surgical History:  Procedure Laterality Date   BACK SURGERY  05/16/2015   BREAST BIOPSY Left 07/19/2021   BREAST LUMPECTOMY Left 08/08/2021   BREAST LUMPECTOMY WITH RADIOACTIVE SEED AND SENTINEL LYMPH NODE BIOPSY Left 08/08/2021   Procedure: LEFT BREAST LUMPECTOMY WITH RADIOACTIVE SEED AND SENTINEL LYMPH NODE BIOPSY;  Surgeon: Abigail Miyamoto, MD;  Location: [facility];  Service: General;  Laterality: Left;   KNEE SURGERY     left   KNEE SURGERY Left 07/08/2012   WISDOM TOOTH EXTRACTION      OB History     Gravida  0   Para  0   Term  0   Preterm  0   AB  0   Living  0      SAB  0   IAB  0   Ectopic  0   Multiple  0   Live Births               Home Medications    Prior to Admission medications   Medication Sig Start Date End Date Taking? Authorizing Provider  atorvastatin (LIPITOR) 20 MG tablet Take 1 tablet (20 mg total) by mouth daily. 09/05/22   Donita Brooks, MD  omeprazole (PRILOSEC) 20 MG  capsule Take 1 capsule (20 mg total) by mouth daily. 11/25/22  Yes Petro Talent-Warren, Sadie Haber, NP  ondansetron (ZOFRAN-ODT) 4 MG disintegrating tablet Take 1 tablet (4 mg total) by mouth every 8 (eight) hours as needed. 11/25/22  Yes Victormanuel Mclure-Warren, Sadie Haber, NP  polyethylene glycol (MIRALAX) 17 g packet Take 17 g twice daily until you have a large bowel movement, then begin taking one packet daily as needed for constipation. 11/25/22  Yes Chyla Schlender-Warren, Sadie Haber, NP  senna-docusate (SENOKOT-S) 8.6-50 MG tablet Take 1 tablet by mouth at bedtime as needed for mild constipation. 11/25/22  Yes Ovella Manygoats-Warren, Sadie Haber, NP  anastrozole (ARIMIDEX) 1 MG tablet Take 1 tablet (1 mg total) by mouth daily. 10/03/22   Rachel Moulds, MD  atenolol (TENORMIN) 25 MG tablet Take 1 tablet (25 mg total) by mouth daily. 03/22/22   Donita Brooks, MD  calcium-vitamin D  (OSCAL WITH D) 250-125 MG-UNIT per tablet Take 1 tablet by mouth daily.    [provider]  cyclobenzaprine (FLEXERIL) 10 MG tablet TAKE ONE TABLET BY MOUTH THREE TIMES A DAY AS NEEDED FOR MUSCLE SPASMS 07/04/22   Donita Brooks, MD  diazepam (VALIUM) 5 MG tablet TAKE ONE TABLET BY MOUTH TWICE A DAY AS NEEDED FOR ANXIETY **NEEDS APPOINTMENT FOR FUTURE REFILLS** 09/23/22   Donita Brooks, MD  diclofenac Sodium (VOLTAREN) 1 % GEL Apply 2 g topically 4 (four) times daily. 03/22/22   Donita Brooks, MD  DULoxetine (CYMBALTA) 60 MG capsule Take 1 capsule (60 mg total) by mouth daily. 03/22/22   Donita Brooks, MD  fish oil-omega-3 fatty acids 1000 MG capsule Take 2 capsules (2 g total) by mouth 2 (two) times daily. 03/22/22   Donita Brooks, MD  fluticasone (FLONASE) 50 MCG/ACT nasal spray Place 2 sprays into both nostrils daily. 03/22/22   Donita Brooks, MD  losartan (COZAAR) 100 MG tablet TAKE 1 TABLET BY MOUTH DAILY 10/14/22   Donita Brooks, MD  traMADol (ULTRAM) 50 MG tablet TAKE 1 TABLET BY MOUTH EVERY 6 HOURS AS NEEDED 11/01/22   Donita Brooks, MD  zolpidem (AMBIEN) 10 MG tablet TAKE 1 TABLET BY MOUTH EVERY NIGHT AT BEDTIME AS NEEDED FOR SLEEP 09/12/22   Donita Brooks, MD    Family History Family History  Problem Relation Age of Onset   Breast cancer Mother    Heart disease Father 64   Diabetes Father    Colon cancer Neg Hx    Colon polyps Neg Hx     Social History Social History   Tobacco Use   Smoking status: Never   Smokeless tobacco: Never  Vaping Use   Vaping Use: Never used  Substance Use Topics   Alcohol use: Yes    Alcohol/week: 5.0 standard drinks of alcohol    Types: 5 Cans of beer per week   Drug use: No     Allergies   Baclofen and Norvasc [amlodipine besylate]   Review of Systems Review of Systems Per HPI  Physical Exam Triage Vital Signs ED Triage Vitals  Enc Vitals Group     BP 11/25/22 1133 100/67     Pulse Rate  11/25/22 1133 94     Resp 11/25/22 1133 16     Temp 11/25/22 1133 97.9 F (36.6 C)     Temp Source 11/25/22 1133 Oral     SpO2 11/25/22 1133 98 %     Weight --      Height --  Head Circumference --      Peak Flow --      Pain Score 11/25/22 1134 0     Pain Loc --      Pain Edu? --      Excl. in GC? --    No data found.  Updated Vital Signs BP 100/67 (BP Location: Right Arm)   Pulse 94   Temp 97.9 F (36.6 C) (Oral)   Resp 16   LMP 03/23/2015   SpO2 98%   Visual Acuity Right Eye Distance:   Left Eye Distance:   Bilateral Distance:    Right Eye Near:   Left Eye Near:    Bilateral Near:     Physical Exam Vitals and nursing note reviewed.  Constitutional:      General: She is not in acute distress.    Appearance: Normal appearance.  HENT:     Head: Normocephalic.  Eyes:     Extraocular Movements: Extraocular movements intact.     Conjunctiva/sclera: Conjunctivae normal.     Pupils: Pupils are equal, round, and reactive to light.  Cardiovascular:     Rate and Rhythm: Normal rate and regular rhythm.     Pulses: Normal pulses.     Heart sounds: Normal heart sounds.  Pulmonary:     Effort: Pulmonary effort is normal. No respiratory distress.     Breath sounds: Normal breath sounds. No stridor. No wheezing, rhonchi or rales.  Abdominal:     General: Bowel sounds are normal. There is no distension.     Palpations: Abdomen is soft.     Tenderness: There is abdominal tenderness. There is no guarding or rebound.  Musculoskeletal:     Cervical back: Normal range of motion.  Skin:    General: Skin is warm and dry.  Neurological:     General: No focal deficit present.     Mental Status: She is alert and oriented to person, place, and time.  Psychiatric:        Mood and Affect: Mood normal.        Behavior: Behavior normal.      UC Treatments / Results  Labs (all labs ordered are listed, but only abnormal results are displayed) Labs Reviewed - No data to  display  EKG   Radiology DG Abd 2 Views  Result Date: 11/25/2022 CLINICAL DATA:  Nausea for 1 week.  Constipation. EXAM: ABDOMEN - 2 VIEW COMPARISON:  Lumbar spine radiographs 05/18/2015. FINDINGS: There is a moderate amount of stool throughout the colon. No bowel distension, wall thickening or pneumoperitoneum demonstrated. Calcified uterine fibroids are noted. No suspicious abdominal calcifications. There is a thoracolumbar scoliosis with stable postsurgical changes at L4-5. Transitional lumbosacral anatomy with no visible ribs at T12. IMPRESSION: Moderate colonic stool burden suggesting constipation. No evidence of bowel obstruction. Electronically Signed   By: Carey Bullocks M.D.   On: 11/25/2022 11:55    Procedures Procedures (including critical care time)  Medications Ordered in UC Medications - No data to display  Initial Impression / Assessment and Plan / UC Course  I have reviewed the triage vital signs and the nursing notes.  Pertinent labs & imaging results that were available during my care of the patient were reviewed by me and considered in my medical decision making (see chart for details).  The patient is well-appearing, she is in no acute distress, vital signs are stable.  X-ray shows moderate colonic stool burden suggesting constipation.  Patient does have some right lower quadrant  tenderness where the stool burden is present on her x-ray.  Suspect patient's symptoms may be related to constipation and reflux.  Do not suspect acute abdomen as patient has been a febrile, she has no guarding or rebound tenderness.  MiraLAX 17 g was prescribed for patient's constipation along with Senokot 8.6-50 mg tablets.  For patient's nausea, she was prescribed Zofran 4 mg, and for her reflux symptoms, she was prescribed omeprazole 20 mg capsules.  Supportive care recommendations were provided and the rest with the patient to include increasing her fluid intake, increasing the fruits and  vegetables in her diet, and remaining active.  Patient was given strict ER follow-up precautions.  Patient advised to follow-up with her primary care physician for further maintenance of symptoms.  Patient is in agreement with this plan of care and verbalizes understanding.  All questions were answered.  Patient stable for discharge.   Final Clinical Impressions(s) / UC Diagnoses   Final diagnoses:  Constipation, unspecified constipation type  Gastroesophageal reflux disease, unspecified whether esophagitis present     Discharge Instructions      Your abdominal x-ray shows that you are constipated. Take medication as prescribed. Increase your fluid intake.  You should be drinking at least 8-10 8 ounce glasses of water daily. Make sure you are eating at least 2-3 servings of fruits and vegetables daily. Try to stay active.   Consider taking an over-the-counter stool softener such as senna while you are taking tramadol to help with constipation. As discussed, if you develop fever, chills, worsening abdominal pain with vomiting, diarrhea, or other concerns, please go to the emergency department immediately for further evaluation. Please follow-up with your primary care physician for further evaluation as needed. Follow-up as needed.     ED Prescriptions     Medication Sig Dispense Auth. Provider   polyethylene glycol (MIRALAX) 17 g packet Take 17 g twice daily until you have a large bowel movement, then begin taking one packet daily as needed for constipation. 30 each Jeannette Maddy-Warren, Sadie Haber, NP   senna-docusate (SENOKOT-S) 8.6-50 MG tablet Take 1 tablet by mouth at bedtime as needed for mild constipation. 30 tablet Justina Bertini-Warren, Sadie Haber, NP   omeprazole (PRILOSEC) 20 MG capsule Take 1 capsule (20 mg total) by mouth daily. 30 capsule Zunaira Lamy-Warren, Sadie Haber, NP   ondansetron (ZOFRAN-ODT) 4 MG disintegrating tablet Take 1 tablet (4 mg total) by mouth every 8 (eight) hours as needed.  20 tablet Vega Withrow-Warren, Sadie Haber, NP      PDMP not reviewed this encounter.   Abran Cantor, NP 11/25/22 1214

## 2022-11-25 NOTE — Discharge Instructions (Addendum)
Your abdominal x-ray shows that you are constipated. Take medication as prescribed. Increase your fluid intake.  You should be drinking at least 8-10 8 ounce glasses of water daily. Make sure you are eating at least 2-3 servings of fruits and vegetables daily. Try to stay active.   Consider taking an over-the-counter stool softener such as senna while you are taking tramadol to help with constipation. As discussed, if you develop fever, chills, worsening abdominal pain with vomiting, diarrhea, or other concerns, please go to the emergency department immediately for further evaluation. Please follow-up with your primary care physician for further evaluation as needed. Follow-up as needed.

## 2022-12-04 ENCOUNTER — Telehealth: Payer: Self-pay | Admitting: Family Medicine

## 2022-12-04 ENCOUNTER — Other Ambulatory Visit: Payer: Self-pay | Admitting: Family Medicine

## 2022-12-04 DIAGNOSIS — F419 Anxiety disorder, unspecified: Secondary | ICD-10-CM

## 2022-12-04 MED ORDER — DIAZEPAM 5 MG PO TABS: ORAL_TABLET | ORAL | 1 refills | Status: DC

## 2022-12-04 NOTE — Telephone Encounter (Signed)
Prescription Request  12/04/2022  LOV: 09/03/2022  What is the name of the medication or equipment?   diazepam (VALIUM) 5 MG tablet [[id number]]  **Last dose taken at least 2 weeks ago. Ok to leave a voicemail if patient doesn't answer.**  Have you contacted your pharmacy to request a refill? Yes   Which pharmacy would you like this sent to?  [facility] [id number] [place], [place] - [address] [place] [address] Phone: [phone] Fax: [phone]    Patient notified that their request is being sent to the clinical staff for review and that they should receive a response within 2 business days.   Please advise at [phone].

## 2022-12-05 DIAGNOSIS — Z13820 Encounter for screening for osteoporosis: Secondary | ICD-10-CM | POA: Diagnosis not present

## 2022-12-18 ENCOUNTER — Other Ambulatory Visit: Payer: Self-pay | Admitting: Family Medicine

## 2022-12-18 DIAGNOSIS — M8580 Other specified disorders of bone density and structure, unspecified site: Secondary | ICD-10-CM | POA: Diagnosis not present

## 2022-12-19 ENCOUNTER — Encounter: Payer: Self-pay | Admitting: Family Medicine

## 2022-12-19 ENCOUNTER — Ambulatory Visit (INDEPENDENT_AMBULATORY_CARE_PROVIDER_SITE_OTHER): Payer: 59 | Admitting: Family Medicine

## 2022-12-19 VITALS — BP 112/58 | HR 92 | Temp 97.8°F | Ht 63.0 in | Wt 130.8 lb

## 2022-12-19 DIAGNOSIS — M25562 Pain in left knee: Secondary | ICD-10-CM | POA: Diagnosis not present

## 2022-12-19 DIAGNOSIS — M25561 Pain in right knee: Secondary | ICD-10-CM | POA: Diagnosis not present

## 2022-12-19 DIAGNOSIS — G8929 Other chronic pain: Secondary | ICD-10-CM

## 2022-12-19 MED ORDER — BUPIVACAINE HCL 0.25 % IJ SOLN
2.0000 mL | Freq: Once | INTRAMUSCULAR | Status: DC
Start: 2022-12-19 — End: 2023-01-03

## 2022-12-19 MED ORDER — LIDOCAINE HCL (PF) 1 % IJ SOLN
2.0000 mL | Freq: Once | INTRAMUSCULAR | Status: DC
Start: 2022-12-19 — End: 2023-01-03

## 2022-12-19 MED ORDER — TRIAMCINOLONE ACETONIDE 40 MG/ML IJ SUSP: 80.0000 mg | Freq: Once | INTRAMUSCULAR | Status: DC

## 2022-12-19 NOTE — Addendum Note (Signed)
Addended by: Venia Carbon K on: 12/19/2022 02:53 PM   Modules accepted: Orders

## 2022-12-19 NOTE — Progress Notes (Signed)
+   Subjective:    Patient ID: Erika Harvey, female    DOB: 17-Sep-1960, 62 y.o.   MRN: [id number]  Knee Pain   Patient presents today complaining of bilateral chronic knee pain.  The pain hurts primarily going up and down steps.  She has had 2 previous knee surgeries.  Both knees hurt equally.  She complains of pain underneath the patella bilaterally.  She denies any laxity to varus or valgus stress.  However she is unable to take NSAIDs due to a history of acute renal insufficiency while taking Celebrex.  She takes tramadol for pain.  Is been more than 3 months since she had a cortisone shot in each knee and she is requesting a repeat cortisone injection.  Patient's last cortisone injection was in February.  She would like me to repeat cortisone injections today.  Her history has not changed. Past Medical History:  Diagnosis Date   Allergy    Anxiety    Arthritis    Breast cancer (HCC) 07/19/2021   Chronic low back pain    Fibroid 03/30/2015   Frequent UTI    Hypertension    Personal history of radiation therapy    Vaginal bleeding 03/30/2015   Past Surgical History:  Procedure Laterality Date   BACK SURGERY  05/16/2015   BREAST BIOPSY Left 07/19/2021   BREAST LUMPECTOMY Left 08/08/2021   BREAST LUMPECTOMY WITH RADIOACTIVE SEED AND SENTINEL LYMPH NODE BIOPSY Left 08/08/2021   Procedure: LEFT BREAST LUMPECTOMY WITH RADIOACTIVE SEED AND SENTINEL LYMPH NODE BIOPSY;  Surgeon: Abigail Miyamoto, MD;  Location: [facility];  Service: General;  Laterality: Left;   KNEE SURGERY     left   KNEE SURGERY Left 07/08/2012   WISDOM TOOTH EXTRACTION     Current Outpatient Medications on File Prior to Visit  Medication Sig Dispense Refill   anastrozole (ARIMIDEX) 1 MG tablet Take 1 tablet (1 mg total) by mouth daily. 30 tablet 3   atenolol (TENORMIN) 25 MG tablet Take 1 tablet (25 mg total) by mouth daily. 90 tablet 2   atorvastatin (LIPITOR) 20 MG tablet Take 1 tablet (20  mg total) by mouth daily. 90 tablet 3   calcium-vitamin D (OSCAL WITH D) 250-125 MG-UNIT per tablet Take 1 tablet by mouth daily.     cyclobenzaprine (FLEXERIL) 10 MG tablet TAKE ONE TABLET BY MOUTH THREE TIMES A DAY AS NEEDED FOR MUSCLE SPASMS 60 tablet 2   diazepam (VALIUM) 5 MG tablet TAKE ONE TABLET BY MOUTH TWICE A DAY AS NEEDED FOR ANXIETY **NEEDS APPOINTMENT FOR FUTURE REFILLS** 60 tablet 1   diclofenac Sodium (VOLTAREN) 1 % GEL Apply 2 g topically 4 (four) times daily. 100 g 5   DULoxetine (CYMBALTA) 60 MG capsule Take 1 capsule (60 mg total) by mouth daily. 90 capsule 3   Harvey oil-omega-3 fatty acids 1000 MG capsule Take 2 capsules (2 g total) by mouth 2 (two) times daily. 14 capsule 0   fluticasone (FLONASE) 50 MCG/ACT nasal spray Place 2 sprays into both nostrils daily. 16 g 6   losartan (COZAAR) 100 MG tablet TAKE 1 TABLET BY MOUTH DAILY 90 tablet 0   omeprazole (PRILOSEC) 20 MG capsule Take 1 capsule (20 mg total) by mouth daily. 30 capsule 0   ondansetron (ZOFRAN-ODT) 4 MG disintegrating tablet Take 1 tablet (4 mg total) by mouth every 8 (eight) hours as needed. 20 tablet 0   polyethylene glycol (MIRALAX) 17 g packet Take 17 g twice daily  until you have a large bowel movement, then begin taking one packet daily as needed for constipation. 30 each 0   senna-docusate (SENOKOT-S) 8.6-50 MG tablet Take 1 tablet by mouth at bedtime as needed for mild constipation. 30 tablet 0   traMADol (ULTRAM) 50 MG tablet TAKE 1 TABLET BY MOUTH EVERY 6 HOURS AS NEEDED 120 tablet 0   zolpidem (AMBIEN) 10 MG tablet TAKE 1 TABLET BY MOUTH EVERY NIGHT AT BEDTIME AS NEEDED FOR SLEEP 30 tablet 3   No current facility-administered medications on file prior to visit.   Allergies  Allergen Reactions   Baclofen    Norvasc [Amlodipine Besylate] Swelling and Rash   Social History   Socioeconomic History   Marital status: Married    Spouse name: Not on file   Number of children: Not on file   Years of  education: Not on file   Highest education level: Not on file  Occupational History   Not on file  Tobacco Use   Smoking status: Never   Smokeless tobacco: Never  Vaping Use   Vaping Use: Never used  Substance and Sexual Activity   Alcohol use: Yes    Alcohol/week: 5.0 standard drinks of alcohol    Types: 5 Cans of beer per week   Drug use: No   Sexual activity: Yes    Birth control/protection: Pill  Other Topics Concern   Not on file  Social History Narrative   Not on file   Social Determinants of Health   Financial Resource Strain: Not on file  Food Insecurity: Not on file  Transportation Needs: Not on file  Physical Activity: Not on file  Stress: Not on file  Social Connections: Not on file  Intimate Partner Violence: Not on file     Review of Systems  All other systems reviewed and are negative.      Objective:   Physical Exam Vitals reviewed.  Constitutional:      Appearance: Normal appearance.  Cardiovascular:     Rate and Rhythm: Normal rate and regular rhythm.     Heart sounds: Normal heart sounds.  Pulmonary:     Effort: Pulmonary effort is normal. No respiratory distress.     Breath sounds: Normal breath sounds. No stridor. No wheezing, rhonchi or rales.  Chest:     Chest wall: No tenderness.  Abdominal:     General: Abdomen is flat. Bowel sounds are normal.     Palpations: Abdomen is soft.  Musculoskeletal:     Right knee: Bony tenderness and crepitus present. Decreased range of motion. No LCL laxity, MCL laxity, ACL laxity or PCL laxity.     Left knee: Bony tenderness and crepitus present. Decreased range of motion. No LCL laxity, MCL laxity, ACL laxity or PCL laxity. Neurological:     Mental Status: She is alert.     Soft tissue swelling anterior to knee.       Assessment & Plan:  Bilateral chronic knee pain  Likely due to osteoarthritis in both knees.  Using sterile technique, I injected the right knee with 2 cc lidocaine, 2 cc of  Marcaine, and 2 cc of 40 mg/mL Kenalog.  Then using sterile technique I injected the left knee with 2 cc lidocaine, 2 cc of Marcaine, and 2 cc of 40 mg/mL Kenalog.  Patient can repeat cortisone injections in both knees every 3 months as long as beneficial

## 2022-12-30 ENCOUNTER — Other Ambulatory Visit: Payer: Self-pay | Admitting: Family Medicine

## 2022-12-30 DIAGNOSIS — G894 Chronic pain syndrome: Secondary | ICD-10-CM

## 2022-12-30 DIAGNOSIS — M5136 Other intervertebral disc degeneration, lumbar region: Secondary | ICD-10-CM

## 2022-12-30 MED ORDER — LIDOCAINE HCL (PF) 1 % IJ SOLN
2.0000 mL | Freq: Once | INTRAMUSCULAR | Status: DC
Start: 2022-12-30 — End: 2023-01-03

## 2022-12-30 MED ORDER — BUPIVACAINE HCL 0.25 % IJ SOLN
2.0000 mL | Freq: Once | INTRAMUSCULAR | Status: DC
Start: 2022-12-30 — End: 2023-01-03

## 2022-12-30 MED ORDER — TRIAMCINOLONE ACETONIDE 40 MG/ML IJ SUSP
80.0000 mg | Freq: Once | INTRAMUSCULAR | Status: DC
Start: 2022-12-30 — End: 2023-01-03

## 2022-12-30 NOTE — Addendum Note (Signed)
Addended by: Venia Carbon K on: 12/30/2022 09:35 AM   Modules accepted: Orders

## 2023-01-02 ENCOUNTER — Inpatient Hospital Stay: Payer: 59 | Attending: Hematology and Oncology | Admitting: Hematology and Oncology

## 2023-01-03 MED ORDER — TRIAMCINOLONE ACETONIDE 40 MG/ML IJ SUSP
80.0000 mg | Freq: Once | INTRAMUSCULAR | Status: AC
Start: 2023-01-03 — End: 2023-01-03
  Administered 2023-01-03: 80 mg via INTRA_ARTICULAR

## 2023-01-03 MED ORDER — BUPIVACAINE HCL 0.25 % IJ SOLN
2.0000 mL | Freq: Once | INTRAMUSCULAR | Status: AC
Start: 2023-01-03 — End: 2023-01-03
  Administered 2023-01-03: 2 mL via INTRA_ARTICULAR

## 2023-01-03 MED ORDER — TRIAMCINOLONE ACETONIDE 40 MG/ML IJ SUSP
40.0000 mg | Freq: Once | INTRAMUSCULAR | Status: AC
  Administered 2023-01-03: 40 mg via INTRA_ARTICULAR

## 2023-01-03 MED ORDER — LIDOCAINE HCL (PF) 1 % IJ SOLN
2.0000 mL | Freq: Once | INTRAMUSCULAR | Status: AC
Start: 2023-01-03 — End: 2023-01-03
  Administered 2023-01-03: 2 mL

## 2023-01-03 NOTE — Addendum Note (Signed)
Addended by: Darral Dash on: 01/03/2023 05:27 PM   Modules accepted: Orders

## 2023-01-07 ENCOUNTER — Other Ambulatory Visit: Payer: Self-pay | Admitting: Family Medicine

## 2023-01-07 DIAGNOSIS — I10 Essential (primary) hypertension: Secondary | ICD-10-CM

## 2023-01-07 NOTE — Telephone Encounter (Signed)
Prescription Request  01/07/2023  LOV: 12/19/2022  What is the name of the medication or equipment?   atenolol (TENORMIN) 25 MG tablet  **90 day supply requested**  Have you contacted your pharmacy to request a refill? Yes   Which pharmacy would you like this sent to?  [facility] [id number] [place], [place] - [address] [place] [address] Phone: [phone] Fax: [phone]    Patient notified that their request is being sent to the clinical staff for review and that they should receive a response within 2 business days.   Please advise pharmacist.

## 2023-01-08 MED ORDER — ATENOLOL 25 MG PO TABS
25.0000 mg | ORAL_TABLET | Freq: Every day | ORAL | 1 refills | Status: DC
Start: 2023-01-08 — End: 2023-07-08

## 2023-01-08 NOTE — Telephone Encounter (Signed)
Ov for BP- 09/03/22 Requested Prescriptions  Pending Prescriptions Disp Refills   atenolol (TENORMIN) 25 MG tablet 90 tablet 2    Sig: Take 1 tablet (25 mg total) by mouth daily.     Cardiovascular: Beta Blockers 2 Failed - 01/07/2023 11:19 AM      Failed - Valid encounter within last 6 months    Recent Outpatient Visits           1 year ago Chronic pain of left knee   [facility] Tanya Nones, Priscille Heidelberg, MD   2 years ago Bilateral chronic knee pain   [facility] Tanya Nones, Priscille Heidelberg, MD   2 years ago Bilateral chronic knee pain   [facility] Tanya Nones, Priscille Heidelberg, MD   3 years ago Acute renal insufficiency   [facility] Donita Brooks, MD   3 years ago Benign essential HTN   [facility] Pickard, Priscille Heidelberg, MD              Passed - Cr in normal range and within 360 days    Creat  Date Value Ref Range Status  09/03/2022 0.92 0.50 - 1.05 mg/dL Final   Creatinine, Urine  Date Value Ref Range Status  09/03/2022 104 20 - 275 mg/dL Final         Passed - Last BP in normal range    BP Readings from Last 1 Encounters:  12/19/22 (!) 112/58         Passed - Last Heart Rate in normal range    Pulse Readings from Last 1 Encounters:  12/19/22 92

## 2023-01-09 ENCOUNTER — Other Ambulatory Visit: Payer: Self-pay | Admitting: Family Medicine

## 2023-01-09 DIAGNOSIS — I10 Essential (primary) hypertension: Secondary | ICD-10-CM

## 2023-01-10 ENCOUNTER — Other Ambulatory Visit: Payer: Self-pay | Admitting: Family Medicine

## 2023-01-10 NOTE — Telephone Encounter (Signed)
Requested medication (s) are due for refill today: yes  Requested medication (s) are on the active medication list: yes  Last refill:  09/12/22 #30/3  Future visit scheduled: no  Notes to clinic:  Unable to refill per protocol, cannot delegate.      Requested Prescriptions  Pending Prescriptions Disp Refills   zolpidem (AMBIEN) 10 MG tablet [Pharmacy Med Name: ZOLPIDEM TARTRATE 10 MG TABLET] 30 tablet     Sig: TAKE 1 TABLET BY MOUTH EVERY NIGHT AT BEDTIME AS NEEDED FOR SLEEP     Not Delegated - Psychiatry:  Anxiolytics/Hypnotics Failed - 01/09/2023  5:32 PM      Failed - This refill cannot be delegated      Failed - Urine Drug Screen completed in last 360 days      Failed - Valid encounter within last 6 months    Recent Outpatient Visits           1 year ago Chronic pain of left knee   [facility] Donita Brooks, MD   2 years ago Bilateral chronic knee pain   [facility] Tanya Nones, Priscille Heidelberg, MD   2 years ago Bilateral chronic knee pain   [facility] Tanya Nones, Priscille Heidelberg, MD   3 years ago Acute renal insufficiency   [facility] Donita Brooks, MD   3 years ago Benign essential HTN   [facility] Pickard, Priscille Heidelberg, MD              Signed Prescriptions Disp Refills   losartan (COZAAR) 100 MG tablet 90 tablet 1    Sig: TAKE 1 TABLET BY MOUTH DAILY     Cardiovascular:  Angiotensin Receptor Blockers Failed - 01/09/2023  5:32 PM      Failed - Valid encounter within last 6 months    Recent Outpatient Visits           1 year ago Chronic pain of left knee   [facility] Donita Brooks, MD   2 years ago Bilateral chronic knee pain   [facility] Tanya Nones, Priscille Heidelberg, MD   2 years ago Bilateral chronic knee pain   [facility] Tanya Nones, Priscille Heidelberg, MD   3 years ago Acute renal insufficiency   [facility] Donita Brooks, MD    3 years ago Benign essential HTN   [facility] Pickard, Priscille Heidelberg, MD              Passed - Cr in normal range and within 180 days    Creat  Date Value Ref Range Status  09/03/2022 0.92 0.50 - 1.05 mg/dL Final   Creatinine, Urine  Date Value Ref Range Status  09/03/2022 104 20 - 275 mg/dL Final         Passed - K in normal range and within 180 days    Potassium  Date Value Ref Range Status  09/03/2022 3.9 3.5 - 5.3 mmol/L Final         Passed - Patient is not pregnant      Passed - Last BP in normal range    BP Readings from Last 1 Encounters:  12/19/22 (!) 112/58

## 2023-01-10 NOTE — Telephone Encounter (Signed)
Requested Prescriptions  Pending Prescriptions Disp Refills   zolpidem (AMBIEN) 10 MG tablet [Pharmacy Med Name: ZOLPIDEM TARTRATE 10 MG TABLET] 30 tablet     Sig: TAKE 1 TABLET BY MOUTH EVERY NIGHT AT BEDTIME AS NEEDED FOR SLEEP     Not Delegated - Psychiatry:  Anxiolytics/Hypnotics Failed - 01/09/2023  5:32 PM      Failed - This refill cannot be delegated      Failed - Urine Drug Screen completed in last 360 days      Failed - Valid encounter within last 6 months    Recent Outpatient Visits           1 year ago Chronic pain of left knee   [facility] Donita Brooks, MD   2 years ago Bilateral chronic knee pain   [facility] Tanya Nones, Priscille Heidelberg, MD   2 years ago Bilateral chronic knee pain   [facility] Tanya Nones, Priscille Heidelberg, MD   3 years ago Acute renal insufficiency   [facility] Donita Brooks, MD   3 years ago Benign essential HTN   [facility] Pickard, Priscille Heidelberg, MD               losartan (COZAAR) 100 MG tablet [Pharmacy Med Name: LOSARTAN POTASSIUM 100 MG TAB] 90 tablet 1    Sig: TAKE 1 TABLET BY MOUTH DAILY     Cardiovascular:  Angiotensin Receptor Blockers Failed - 01/09/2023  5:32 PM      Failed - Valid encounter within last 6 months    Recent Outpatient Visits           1 year ago Chronic pain of left knee   [facility] Donita Brooks, MD   2 years ago Bilateral chronic knee pain   [facility] Tanya Nones, Priscille Heidelberg, MD   2 years ago Bilateral chronic knee pain   [facility] Tanya Nones, Priscille Heidelberg, MD   3 years ago Acute renal insufficiency   [facility] Donita Brooks, MD   3 years ago Benign essential HTN   [facility] Pickard, Priscille Heidelberg, MD              Passed - Cr in normal range and within 180 days    Creat  Date Value Ref Range Status  09/03/2022 0.92 0.50 - 1.05 mg/dL Final    Creatinine, Urine  Date Value Ref Range Status  09/03/2022 104 20 - 275 mg/dL Final         Passed - K in normal range and within 180 days    Potassium  Date Value Ref Range Status  09/03/2022 3.9 3.5 - 5.3 mmol/L Final         Passed - Patient is not pregnant      Passed - Last BP in normal range    BP Readings from Last 1 Encounters:  12/19/22 (!) 112/58

## 2023-01-16 NOTE — Telephone Encounter (Signed)
Patient called to follow up on refill request for traMADol (ULTRAM) 50 MG tablet  Stated she spoke with the pharmacy this morning and they don't have the refill request on file.  Chart says pharmacy confirmed receipt of refill request on Friday, 01/10/23 at 4:49pm. Patient advised of this.   Patient requesting for provider to follow up with pharmacy.   Please advise patient at [phone].

## 2023-01-26 ENCOUNTER — Other Ambulatory Visit: Payer: Self-pay | Admitting: Family Medicine

## 2023-01-26 DIAGNOSIS — F419 Anxiety disorder, unspecified: Secondary | ICD-10-CM

## 2023-02-10 ENCOUNTER — Other Ambulatory Visit: Payer: Self-pay | Admitting: Hematology and Oncology

## 2023-02-10 DIAGNOSIS — Z170 Estrogen receptor positive status [ER+]: Secondary | ICD-10-CM

## 2023-02-10 MED ORDER — ANASTROZOLE 1 MG PO TABS
1.0000 mg | ORAL_TABLET | Freq: Every day | ORAL | 3 refills | Status: DC
Start: 2023-02-10 — End: 2023-06-16

## 2023-02-14 ENCOUNTER — Other Ambulatory Visit: Payer: Self-pay | Admitting: Family Medicine

## 2023-02-17 NOTE — Telephone Encounter (Signed)
Requested medication (s) are due for refill today: yes  Requested medication (s) are on the active medication list: yes  Last refill:  01/10/23  Future visit scheduled: no  Notes to clinic:  Unable to refill per protocol, cannot delegate.      Requested Prescriptions  Pending Prescriptions Disp Refills   traMADol (ULTRAM) 50 MG tablet [Pharmacy Med Name: traMADol HCL 50MG  TABLET] 120 tablet     Sig: TAKE 1 TABLET BY MOUTH EVERY 6 HOURS AS NEEDED     Not Delegated - Analgesics:  Opioid Agonists Failed - 02/14/2023 11:37 AM      Failed - This refill cannot be delegated      Failed - Urine Drug Screen completed in last 360 days      Failed - Valid encounter within last 3 months    Recent Outpatient Visits           1 year ago Chronic pain of left knee   [facility] Donita Brooks, MD   2 years ago Bilateral chronic knee pain   [facility] Donita Brooks, MD   2 years ago Bilateral chronic knee pain   [facility] Tanya Nones, Priscille Heidelberg, MD   3 years ago Acute renal insufficiency   [facility] Donita Brooks, MD   3 years ago Benign essential HTN   [facility] Pickard, Priscille Heidelberg, MD

## 2023-02-18 NOTE — Telephone Encounter (Signed)
Patient called back to check the status of this medication.

## 2023-03-07 ENCOUNTER — Other Ambulatory Visit: Payer: Self-pay | Admitting: Family Medicine

## 2023-03-07 DIAGNOSIS — G894 Chronic pain syndrome: Secondary | ICD-10-CM

## 2023-03-07 DIAGNOSIS — M5136 Other intervertebral disc degeneration, lumbar region: Secondary | ICD-10-CM

## 2023-03-17 ENCOUNTER — Other Ambulatory Visit: Payer: Self-pay

## 2023-03-17 DIAGNOSIS — F419 Anxiety disorder, unspecified: Secondary | ICD-10-CM

## 2023-03-17 DIAGNOSIS — F320 Major depressive disorder, single episode, mild: Secondary | ICD-10-CM

## 2023-03-17 NOTE — Telephone Encounter (Signed)
Prescription Request  03/17/2023  LOV: 12/19/22  What is the name of the medication or equipment? DULoxetine (CYMBALTA) 60 MG capsule [[id number]]  Have you contacted your pharmacy to request a refill? Yes   Which pharmacy would you like this sent to?  [facility] [id number] [place], [place] - [address] [place] [address] Phone: [phone] Fax: [phone]    Patient notified that their request is being sent to the clinical staff for review and that they should receive a response within 2 business days.   Please advise at [facility] [phone]

## 2023-03-18 MED ORDER — DULOXETINE HCL 60 MG PO CPEP
60.0000 mg | ORAL_CAPSULE | Freq: Every day | ORAL | 1 refills | Status: DC
Start: 2023-03-18 — End: 2023-09-23

## 2023-03-18 NOTE — Telephone Encounter (Signed)
Requested Prescriptions  Pending Prescriptions Disp Refills   DULoxetine (CYMBALTA) 60 MG capsule 90 capsule 1    Sig: Take 1 capsule (60 mg total) by mouth daily.     Psychiatry: Antidepressants - SNRI - duloxetine Failed - 03/17/2023  9:38 AM      Failed - Completed PHQ-2 or PHQ-9 in the last 360 days      Failed - Valid encounter within last 6 months    Recent Outpatient Visits           1 year ago Chronic pain of left knee   [facility] Donita Brooks, MD   2 years ago Bilateral chronic knee pain   [facility] Tanya Nones, Priscille Heidelberg, MD   2 years ago Bilateral chronic knee pain   [facility] Tanya Nones, Priscille Heidelberg, MD   3 years ago Acute renal insufficiency   [facility] Donita Brooks, MD   4 years ago Benign essential HTN   [facility] Pickard, Priscille Heidelberg, MD              Passed - Cr in normal range and within 360 days    Creat  Date Value Ref Range Status  09/03/2022 0.92 0.50 - 1.05 mg/dL Final   Creatinine, Urine  Date Value Ref Range Status  09/03/2022 104 20 - 275 mg/dL Final         Passed - eGFR is 30 or above and within 360 days    GFR, Est African American  Date Value Ref Range Status  07/08/2019 71 > OR = 60 mL/min/1.52m2 Final   GFR, Est Non African American  Date Value Ref Range Status  07/08/2019 62 > OR = 60 mL/min/1.30m2 Final   eGFR  Date Value Ref Range Status  09/03/2022 70 > OR = 60 mL/min/1.26m2 Final         Passed - Last BP in normal range    BP Readings from Last 1 Encounters:  12/19/22 (!) 112/58

## 2023-03-22 ENCOUNTER — Other Ambulatory Visit: Payer: Self-pay | Admitting: Family Medicine

## 2023-03-24 NOTE — Telephone Encounter (Signed)
Requested medications are due for refill today.  yes  Requested medications are on the active medications list.  yes  Last refill. 02/18/2023 #120 0 rf  Future visit scheduled.   no  Notes to clinic.  Refill not delegated.    Requested Prescriptions  Pending Prescriptions Disp Refills   traMADol (ULTRAM) 50 MG tablet [Pharmacy Med Name: traMADol HCL 50MG  TABLET] 120 tablet     Sig: TAKE 1 TABLET BY MOUTH EVERY 6 HOURS AS NEEDED     Not Delegated - Analgesics:  Opioid Agonists Failed - 03/22/2023 11:35 AM      Failed - This refill cannot be delegated      Failed - Urine Drug Screen completed in last 360 days      Failed - Valid encounter within last 3 months    Recent Outpatient Visits           1 year ago Chronic pain of left knee   [facility] Donita Brooks, MD   2 years ago Bilateral chronic knee pain   [facility] Donita Brooks, MD   2 years ago Bilateral chronic knee pain   [facility] Tanya Nones, Priscille Heidelberg, MD   3 years ago Acute renal insufficiency   [facility] Donita Brooks, MD   4 years ago Benign essential HTN   [facility] Pickard, Priscille Heidelberg, MD

## 2023-03-25 ENCOUNTER — Ambulatory Visit
Admission: RE | Admit: 2023-03-25 | Discharge: 2023-03-25 | Disposition: A | Discharge status: Home / Self Care | Payer: 59 | Source: Ambulatory Visit | Attending: Hematology and Oncology | Admitting: Hematology and Oncology

## 2023-03-25 ENCOUNTER — Inpatient Hospital Stay: Admission: RE | Admit: 2023-03-25 | Payer: 59 | Source: Ambulatory Visit

## 2023-03-25 DIAGNOSIS — Z170 Estrogen receptor positive status [ER+]: Secondary | ICD-10-CM

## 2023-03-27 ENCOUNTER — Ambulatory Visit (INDEPENDENT_AMBULATORY_CARE_PROVIDER_SITE_OTHER): Payer: 59 | Admitting: Family Medicine

## 2023-03-27 ENCOUNTER — Ambulatory Visit: Payer: 59 | Admitting: Family Medicine

## 2023-03-27 VITALS — BP 110/62 | HR 81 | Temp 97.7°F | Ht 63.0 in | Wt 124.0 lb

## 2023-03-27 DIAGNOSIS — E7800 Pure hypercholesterolemia, unspecified: Secondary | ICD-10-CM | POA: Diagnosis not present

## 2023-03-27 MED ORDER — TRAMADOL HCL 50 MG PO TABS: 50.0000 mg | ORAL_TABLET | Freq: Four times a day (QID) | ORAL | 0 refills | Status: DC | PRN

## 2023-03-27 NOTE — Progress Notes (Signed)
Subjective:    Patient ID: Erika Harvey, female    DOB: 09-13-1960, 62 y.o.   MRN: [id number]  Knee Pain   Medication Refill  She has a history of chronic low back pain for which she takes tramadol.  She takes 2 tablets in the morning and usually 2 tablets in the evening.  Therefore she is taking 4 pills a day.  This equates to 120 tablets/month.  She is also on 2 Valium pills a day as muscle relaxers and for anxiety.  She denies any uncontrolled pain.  In February, we checked her cholesterol and her LDL cholesterol was 160.  Her total cholesterol was 250.  She started back on atorvastatin.  She denies any myalgias or right upper quadrant pain.  She is here today to recheck her cholesterol.  She denies any chest pain or shortness of breath or dyspnea on exertion Past Medical History:  Diagnosis Date   Allergy    Anxiety    Arthritis    Breast cancer (HCC) 07/19/2021   Chronic low back pain    Fibroid 03/30/2015   Frequent UTI    Hypertension    Personal history of radiation therapy    Vaginal bleeding 03/30/2015   Past Surgical History:  Procedure Laterality Date   BACK SURGERY  05/16/2015   BREAST BIOPSY Left 07/19/2021   BREAST LUMPECTOMY Left 08/08/2021   BREAST LUMPECTOMY WITH RADIOACTIVE SEED AND SENTINEL LYMPH NODE BIOPSY Left 08/08/2021   Procedure: LEFT BREAST LUMPECTOMY WITH RADIOACTIVE SEED AND SENTINEL LYMPH NODE BIOPSY;  Surgeon: Abigail Miyamoto, MD;  Location: [facility];  Service: General;  Laterality: Left;   KNEE SURGERY     left   KNEE SURGERY Left 07/08/2012   WISDOM TOOTH EXTRACTION     Current Outpatient Medications on File Prior to Visit  Medication Sig Dispense Refill   anastrozole (ARIMIDEX) 1 MG tablet Take 1 tablet (1 mg total) by mouth daily. 30 tablet 3   atenolol (TENORMIN) 25 MG tablet Take 1 tablet (25 mg total) by mouth daily. 90 tablet 1   atorvastatin (LIPITOR) 20 MG tablet Take 1 tablet (20 mg total) by mouth daily. 90  tablet 3   calcium-vitamin D (OSCAL WITH D) 250-125 MG-UNIT per tablet Take 1 tablet by mouth daily.     cyclobenzaprine (FLEXERIL) 10 MG tablet TAKE ONE TABLET BY MOUTH THREE TIMES A DAY AS NEEDED FOR MUSCLE SPASMS 60 tablet 2   diazepam (VALIUM) 5 MG tablet TAKE ONE TABLET BY MOUTH TWO TIMES A DAY AS NEEDED FOR ANXIETY **NEEDS APPOINTMENT FOR FUTURE REFILLS** 60 tablet 1   diclofenac Sodium (VOLTAREN) 1 % GEL Apply 2 g topically 4 (four) times daily. 100 g 5   DULoxetine (CYMBALTA) 60 MG capsule Take 1 capsule (60 mg total) by mouth daily. 90 capsule 1   Harvey oil-omega-3 fatty acids 1000 MG capsule Take 2 capsules (2 g total) by mouth 2 (two) times daily. 14 capsule 0   fluticasone (FLONASE) 50 MCG/ACT nasal spray Place 2 sprays into both nostrils daily. 16 g 6   losartan (COZAAR) 100 MG tablet TAKE 1 TABLET BY MOUTH DAILY 90 tablet 1   omeprazole (PRILOSEC) 20 MG capsule Take 1 capsule (20 mg total) by mouth daily. 30 capsule 0   ondansetron (ZOFRAN-ODT) 4 MG disintegrating tablet Take 1 tablet (4 mg total) by mouth every 8 (eight) hours as needed. 20 tablet 0   polyethylene glycol (MIRALAX) 17 g packet Take 17 g  twice daily until you have a large bowel movement, then begin taking one packet daily as needed for constipation. 30 each 0   senna-docusate (SENOKOT-S) 8.6-50 MG tablet Take 1 tablet by mouth at bedtime as needed for mild constipation. 30 tablet 0   traMADol (ULTRAM) 50 MG tablet TAKE 1 TABLET BY MOUTH EVERY 6 HOURS AS NEEDED 120 tablet 0   zolpidem (AMBIEN) 10 MG tablet TAKE 1 TABLET BY MOUTH EVERY NIGHT AT BEDTIME AS NEEDED FOR SLEEP 30 tablet 3   No current facility-administered medications on file prior to visit.   Allergies  Allergen Reactions   Baclofen    Norvasc [Amlodipine Besylate] Swelling and Rash   Social History   Socioeconomic History   Marital status: Married    Spouse name: Not on file   Number of children: Not on file   Years of education: Not on file    Highest education level: Not on file  Occupational History   Not on file  Tobacco Use   Smoking status: Never   Smokeless tobacco: Never  Vaping Use   Vaping status: Never Used  Substance and Sexual Activity   Alcohol use: Yes    Alcohol/week: 5.0 standard drinks of alcohol    Types: 5 Cans of beer per week   Drug use: No   Sexual activity: Yes    Birth control/protection: Pill  Other Topics Concern   Not on file  Social History Narrative   Not on file   Social Determinants of Health   Financial Resource Strain: Not on file  Food Insecurity: Not on file  Transportation Needs: Not on file  Physical Activity: Not on file  Stress: Not on file  Social Connections: Not on file  Intimate Partner Violence: Not on file     Review of Systems  All other systems reviewed and are negative.      Objective:   Physical Exam Vitals reviewed.  Constitutional:      Appearance: Normal appearance.  Cardiovascular:     Rate and Rhythm: Normal rate and regular rhythm.     Heart sounds: Normal heart sounds.  Pulmonary:     Effort: Pulmonary effort is normal. No respiratory distress.     Breath sounds: Normal breath sounds. No stridor. No wheezing, rhonchi or rales.  Chest:     Chest wall: No tenderness.  Abdominal:     General: Abdomen is flat. Bowel sounds are normal.     Palpations: Abdomen is soft.  Neurological:     Mental Status: She is alert.          Assessment & Plan:  Pure hypercholesterolemia - Plan: COMPLETE METABOLIC PANEL WITH GFR, Lipid panel Repeat fasting lipid panel and CMP today.  Ideally like to see her LDL cholesterol less than 630 and her total cholesterol less than 200.  Blood pressure is low today.  Have asked the patient to monitor this at home.  If her systolic blood pressure is around 110 or less at home consistently we can discontinue atenolol.

## 2023-03-28 LAB — LIPID PANEL
Cholesterol: 160 mg/dL (ref <200)
HDL: 56 mg/dL (ref >=50)
LDL Cholesterol (Calc): 79 mg/dL (calc)
Non-HDL Cholesterol (Calc): 104 mg/dL (calc) (ref <130)
Total CHOL/HDL Ratio: 2.9 (calc) (ref <5.0)
Triglycerides: 145 mg/dL (ref <150)

## 2023-03-28 LAB — COMPLETE METABOLIC PANEL WITH GFR
AG Ratio: 1.6 (calc) (ref 1.0–2.5)
ALT: 14 U/L (ref 6–29)
AST: 14 U/L (ref 10–35)
Albumin: 4.2 g/dL (ref 3.6–5.1)
Alkaline phosphatase (APISO): 61 U/L (ref 37–153)
BUN: 14 mg/dL (ref 7–25)
CO2: 26 mmol/L (ref 20–32)
Calcium: 9.7 mg/dL (ref 8.6–10.4)
Chloride: 105 mmol/L (ref 98–110)
Creat: 1.05 mg/dL (ref 0.50–1.05)
Globulin: 2.6 g/dL (calc) (ref 1.9–3.7)
Glucose, Bld: 98 mg/dL (ref 65–99)
Potassium: 4.1 mmol/L (ref 3.5–5.3)
Sodium: 140 mmol/L (ref 135–146)
Total Bilirubin: 0.3 mg/dL (ref 0.2–1.2)
Total Protein: 6.8 g/dL (ref 6.1–8.1)
eGFR: 60 mL/min/{1.73_m2} (ref >=60)

## 2023-04-10 ENCOUNTER — Other Ambulatory Visit: Payer: Self-pay | Admitting: Family Medicine

## 2023-04-10 DIAGNOSIS — F419 Anxiety disorder, unspecified: Secondary | ICD-10-CM

## 2023-04-10 NOTE — Telephone Encounter (Signed)
Requested medication (s) are due for refill today: yes  Requested medication (s) are on the active medication list: yes  Last refill:  01/27/23  Future visit scheduled: no  Notes to clinic:  Unable to refill per protocol, cannot delegate.      Requested Prescriptions  Pending Prescriptions Disp Refills   diazepam (VALIUM) 5 MG tablet [Pharmacy Med Name: diazePAM 5 MG TABLET] 60 tablet     Sig: TAKE 1 TABLET BY MOUTH 2 TIMES A DAY AS NEEDED FOR ANXIETY **NEEDS TO MAKE AN APPOINTMENT FOR FUTURE REFILLS**     Not Delegated - Psychiatry: Anxiolytics/Hypnotics 2 Failed - 04/10/2023 12:22 PM      Failed - This refill cannot be delegated      Failed - Urine Drug Screen completed in last 360 days      Failed - Valid encounter within last 6 months    Recent Outpatient Visits           2 years ago Chronic pain of left knee   [facility] Donita Brooks, MD   2 years ago Bilateral chronic knee pain   [facility] Tanya Nones, Priscille Heidelberg, MD   3 years ago Bilateral chronic knee pain   [facility] Tanya Nones, Priscille Heidelberg, MD   3 years ago Acute renal insufficiency   [facility] Donita Brooks, MD   4 years ago Benign essential HTN   [facility] Pickard, Priscille Heidelberg, MD              Passed - Patient is not pregnant

## 2023-04-11 NOTE — Telephone Encounter (Signed)
Pt requesting refill. Please advise  diazepam (VALIUM) 5 MG tablet [[id number]]  LOV: 03/27/23  CB#: [phone]

## 2023-04-16 NOTE — Telephone Encounter (Signed)
Pharmacy sent script to follow up on refills requested for  omeprazole (PRILOSEC) 20 MG capsule (90 day script requested)  diazepam (VALIUM) 5 MG tablet [[id number]]  **Please see previous messages in thread**  Pharmacy:  [facility] [id number] - [place], [place] - [address], [place] [address] Phone: [phone]  Fax: [phone]   LOV 03/27/23  Please advise pharmacist.

## 2023-04-21 ENCOUNTER — Other Ambulatory Visit: Payer: Self-pay | Admitting: Family Medicine

## 2023-04-21 NOTE — Telephone Encounter (Signed)
Prescription Request  04/21/2023  LOV: 03/27/2023  What is the name of the medication or equipment? omeprazole (PRILOSEC) 20 MG capsule   Have you contacted your pharmacy to request a refill? Yes   Which pharmacy would you like this sent to?  [facility] [id number] [place], [place] - [address] [place] [address] Phone: [phone] Fax: [phone]    Patient notified that their request is being sent to the clinical staff for review and that they should receive a response within 2 business days.   Please advise at [facility] [phone]

## 2023-04-22 ENCOUNTER — Other Ambulatory Visit: Payer: Self-pay | Admitting: Family Medicine

## 2023-04-22 NOTE — Telephone Encounter (Signed)
Requested medication (s) are due for refill today: yes  Requested medication (s) are on the active medication list: yes  Last refill:  11/25/22  Future visit scheduled: no  Notes to clinic:  Unable to refill per protocol, last refill by another provider.      Requested Prescriptions  Pending Prescriptions Disp Refills   omeprazole (PRILOSEC) 20 MG capsule 30 capsule 0    Sig: Take 1 capsule (20 mg total) by mouth daily.     Gastroenterology: Proton Pump Inhibitors Failed - 04/21/2023 10:03 AM      Failed - Valid encounter within last 12 months    Recent Outpatient Visits           2 years ago Chronic pain of left knee   [facility] Donita Brooks, MD   2 years ago Bilateral chronic knee pain   [facility] Tanya Nones Priscille Heidelberg, MD   3 years ago Bilateral chronic knee pain   [facility] Tanya Nones, Priscille Heidelberg, MD   3 years ago Acute renal insufficiency   [facility] Donita Brooks, MD   4 years ago Benign essential HTN   [facility] Pickard, Priscille Heidelberg, MD

## 2023-04-22 NOTE — Telephone Encounter (Signed)
Requested medication (s) are due for refill today -yes  Requested medication (s) are on the active medication list -yes  Future visit scheduled -no  Last refill: 03/27/23 #120  Notes to clinic: non delegated Rx  Requested Prescriptions  Pending Prescriptions Disp Refills   traMADol (ULTRAM) 50 MG tablet [Pharmacy Med Name: traMADol HCL 50MG  TABLET] 120 tablet     Sig: TAKE 1 TABLET BY MOUTH EVERY 6 HOURS AS NEEDED     Not Delegated - Analgesics:  Opioid Agonists Failed - 04/22/2023  9:49 AM      Failed - This refill cannot be delegated      Failed - Urine Drug Screen completed in last 360 days      Failed - Valid encounter within last 3 months    Recent Outpatient Visits           2 years ago Chronic pain of left knee   [facility] Donita Brooks, MD   2 years ago Bilateral chronic knee pain   [facility] Tanya Nones, Priscille Heidelberg, MD   3 years ago Bilateral chronic knee pain   [facility] Tanya Nones, Priscille Heidelberg, MD   3 years ago Acute renal insufficiency   [facility] Tanya Nones, Priscille Heidelberg, MD   4 years ago Benign essential HTN   [facility] Tanya Nones, Priscille Heidelberg, MD                 Requested Prescriptions  Pending Prescriptions Disp Refills   traMADol (ULTRAM) 50 MG tablet [Pharmacy Med Name: traMADol HCL 50MG  TABLET] 120 tablet     Sig: TAKE 1 TABLET BY MOUTH EVERY 6 HOURS AS NEEDED     Not Delegated - Analgesics:  Opioid Agonists Failed - 04/22/2023  9:49 AM      Failed - This refill cannot be delegated      Failed - Urine Drug Screen completed in last 360 days      Failed - Valid encounter within last 3 months    Recent Outpatient Visits           2 years ago Chronic pain of left knee   [facility] Donita Brooks, MD   2 years ago Bilateral chronic knee pain   [facility] Tanya Nones, Priscille Heidelberg, MD   3 years ago Bilateral chronic knee pain   [facility] Tanya Nones, Priscille Heidelberg, MD   3 years ago Acute renal insufficiency   [facility] Donita Brooks, MD   4 years ago Benign essential HTN   [facility] Pickard, Priscille Heidelberg, MD

## 2023-04-28 ENCOUNTER — Telehealth: Payer: Self-pay

## 2023-04-28 NOTE — Telephone Encounter (Signed)
Pt called in to request a refill of this med ondansetron (ZOFRAN-ODT) 4 MG disintegrating tablet [[id number]]. Pt also called in to check on status of these refills; Pt states that she is completely out of this meds   traMADol (ULTRAM) 50 MG tablet [[id number]] omeprazole (PRILOSEC) 20 MG capsule [[id number]]   LOV: 03/27/23  PHARMACY: [facility] [id number] [place], [place] - [address], [place] [address] Phone: [phone]  Fax: [phone] DEA #: --    Cb#: [phone]

## 2023-04-29 ENCOUNTER — Other Ambulatory Visit: Payer: Self-pay | Admitting: Family Medicine

## 2023-04-29 ENCOUNTER — Other Ambulatory Visit: Payer: Self-pay

## 2023-04-29 DIAGNOSIS — K219 Gastro-esophageal reflux disease without esophagitis: Secondary | ICD-10-CM

## 2023-04-29 MED ORDER — OMEPRAZOLE 20 MG PO CPDR
20.0000 mg | DELAYED_RELEASE_CAPSULE | Freq: Every day | ORAL | 1 refills | Status: DC
Start: 2023-04-29 — End: 2023-10-21

## 2023-04-29 NOTE — Telephone Encounter (Signed)
Prescription Request  04/29/2023  LOV: 03/27/2023  What is the name of the medication or equipment?   zolpidem (AMBIEN) 10 MG tablet [[id number]]   omeprazole (PRILOSEC) 20 MG capsule - **patient needs this today**  Have you contacted your pharmacy to request a refill? Yes   Which pharmacy would you like this sent to?  [facility] [id number] [place], [place] - [address] [place] [address] Phone: [phone] Fax: [phone]    Patient notified that their request is being sent to the clinical staff for review and that they should receive a response within 2 business days.   Please advise pharmacist

## 2023-04-30 NOTE — Telephone Encounter (Signed)
Requested medication (s) are due for refill today: yes  Requested medication (s) are on the active medication list: yes  Last refill:  01/10/23  Future visit scheduled: no  Notes to clinic:  Unable to refill per protocol, cannot delegate.      Requested Prescriptions  Pending Prescriptions Disp Refills   zolpidem (AMBIEN) 10 MG tablet [Pharmacy Med Name: ZOLPIDEM TARTRATE 10 MG TABLET] 30 tablet     Sig: TAKE 1 TABLET BY MOUTH EVERY NIGHT AT BEDTIME AS NEEDED FOR SLEEP     Not Delegated - Psychiatry:  Anxiolytics/Hypnotics Failed - 04/29/2023  2:02 PM      Failed - This refill cannot be delegated      Failed - Urine Drug Screen completed in last 360 days      Failed - Valid encounter within last 6 months    Recent Outpatient Visits           2 years ago Chronic pain of left knee   [facility] Donita Brooks, MD   2 years ago Bilateral chronic knee pain   [facility] Donita Brooks, MD   3 years ago Bilateral chronic knee pain   [facility] Tanya Nones, Priscille Heidelberg, MD   3 years ago Acute renal insufficiency   [facility] Donita Brooks, MD   4 years ago Benign essential HTN   [facility] Pickard, Priscille Heidelberg, MD

## 2023-05-01 ENCOUNTER — Other Ambulatory Visit: Payer: Self-pay | Admitting: Family Medicine

## 2023-05-01 MED ORDER — ONDANSETRON 4 MG PO TBDP: 4.0000 mg | ORAL_TABLET | Freq: Three times a day (TID) | ORAL | 0 refills | Status: DC | PRN

## 2023-05-01 MED ORDER — TRAMADOL HCL 50 MG PO TABS: 50.0000 mg | ORAL_TABLET | Freq: Four times a day (QID) | ORAL | 0 refills | Status: DC | PRN

## 2023-05-01 NOTE — Telephone Encounter (Signed)
Pt called in to check on status of this refill ondansetron (ZOFRAN-ODT) 4 MG disintegrating tablet [[id number]]. Pt stated that after getting her omeprazole pt thought it would help with the nausea.Pt stated that it has not helped with the nausea.   LOV: 03/27/23  PHARMACY: [facility] [id number] [place], [place] - [address], [place] [address] Phone: [phone]  Fax: [phone] DEA #: --  CB#: [phone]

## 2023-05-10 ENCOUNTER — Other Ambulatory Visit: Payer: Self-pay | Admitting: Family Medicine

## 2023-05-12 NOTE — Telephone Encounter (Signed)
Requested medication (s) are due for refill today: no  Requested medication (s) are on the active medication list: yes  Last refill:  05/01/23 # 20  Future visit scheduled: no  Notes to clinic:  med not delegated to NT to reorder   Requested Prescriptions  Pending Prescriptions Disp Refills   ondansetron (ZOFRAN-ODT) 4 MG disintegrating tablet [Pharmacy Med Name: ONDANSETRON ODT 4 MG TABLET] 20 tablet 0    Sig: PLACE 1 TABLET BY MOUTH EVERY 8 HOURS AS NEEDED     Not Delegated - Gastroenterology: Antiemetics - ondansetron Failed - 05/10/2023  4:14 PM      Failed - This refill cannot be delegated      Failed - Valid encounter within last 6 months    Recent Outpatient Visits           2 years ago Chronic pain of left knee   [facility] Donita Brooks, MD   2 years ago Bilateral chronic knee pain   [facility] Tanya Nones, Priscille Heidelberg, MD   3 years ago Bilateral chronic knee pain   [facility] Tanya Nones, Priscille Heidelberg, MD   3 years ago Acute renal insufficiency   [facility] Tanya Nones, Priscille Heidelberg, MD   4 years ago Benign essential HTN   [facility] Pickard, Priscille Heidelberg, MD              Passed - AST in normal range and within 360 days    AST  Date Value Ref Range Status  03/27/2023 14 10 - 35 U/L Final         Passed - ALT in normal range and within 360 days    ALT  Date Value Ref Range Status  03/27/2023 14 6 - 29 U/L Final

## 2023-05-14 ENCOUNTER — Other Ambulatory Visit: Payer: Self-pay | Admitting: Hematology and Oncology

## 2023-05-14 DIAGNOSIS — Z9889 Other specified postprocedural states: Secondary | ICD-10-CM

## 2023-05-27 ENCOUNTER — Telehealth: Payer: Self-pay | Admitting: Family Medicine

## 2023-05-27 ENCOUNTER — Other Ambulatory Visit: Payer: Self-pay

## 2023-05-27 DIAGNOSIS — K219 Gastro-esophageal reflux disease without esophagitis: Secondary | ICD-10-CM

## 2023-05-27 MED ORDER — ONDANSETRON 4 MG PO TBDP
4.0000 mg | ORAL_TABLET | Freq: Three times a day (TID) | ORAL | 0 refills | Status: DC | PRN
Start: 2023-05-27 — End: 2023-07-29

## 2023-05-27 NOTE — Telephone Encounter (Signed)
Prescription Request  05/27/2023  LOV: 03/27/2023  What is the name of the medication or equipment?   ondansetron (ZOFRAN-ODT) 4 MG disintegrating tablet   Have you contacted your pharmacy to request a refill? Yes   Which pharmacy would you like this sent to?  [facility] [id number] [place], [place] - [address] [place] [address] Phone: [phone] Fax: [phone]    Patient notified that their request is being sent to the clinical staff for review and that they should receive a response within 2 business days.   Please advise pharmacist.

## 2023-06-01 ENCOUNTER — Other Ambulatory Visit: Payer: Self-pay | Admitting: Family Medicine

## 2023-06-03 NOTE — Telephone Encounter (Signed)
Requested medication (s) are due for refill today - yes  Requested medication (s) are on the active medication list -yes  Future visit scheduled -no  Last refill: 05/01/23 #120  Notes to clinic: non delegated Rx  Requested Prescriptions  Pending Prescriptions Disp Refills   traMADol (ULTRAM) 50 MG tablet [Pharmacy Med Name: traMADol HCL 50MG  TABLET] 120 tablet     Sig: TAKE 1 TABLET BY MOUTH EVERY 6 HOURS AS NEEDED     Not Delegated - Analgesics:  Opioid Agonists Failed - 06/01/2023 12:36 PM      Failed - This refill cannot be delegated      Failed - Urine Drug Screen completed in last 360 days      Failed - Valid encounter within last 3 months    Recent Outpatient Visits           2 years ago Chronic pain of left knee   [facility] Donita Brooks, MD   2 years ago Bilateral chronic knee pain   [facility] Tanya Nones, Priscille Heidelberg, MD   3 years ago Bilateral chronic knee pain   [facility] Tanya Nones, Priscille Heidelberg, MD   3 years ago Acute renal insufficiency   [facility] Tanya Nones, Priscille Heidelberg, MD   4 years ago Benign essential HTN   [facility] Tanya Nones, Priscille Heidelberg, MD                 Requested Prescriptions  Pending Prescriptions Disp Refills   traMADol (ULTRAM) 50 MG tablet [Pharmacy Med Name: traMADol HCL 50MG  TABLET] 120 tablet     Sig: TAKE 1 TABLET BY MOUTH EVERY 6 HOURS AS NEEDED     Not Delegated - Analgesics:  Opioid Agonists Failed - 06/01/2023 12:36 PM      Failed - This refill cannot be delegated      Failed - Urine Drug Screen completed in last 360 days      Failed - Valid encounter within last 3 months    Recent Outpatient Visits           2 years ago Chronic pain of left knee   [facility] Donita Brooks, MD   2 years ago Bilateral chronic knee pain   [facility] Tanya Nones, Priscille Heidelberg, MD   3 years ago Bilateral chronic knee pain   [facility] Tanya Nones, Priscille Heidelberg, MD   3 years ago Acute renal insufficiency   [facility] Donita Brooks, MD   4 years ago Benign essential HTN   [facility] Pickard, Priscille Heidelberg, MD

## 2023-06-06 ENCOUNTER — Other Ambulatory Visit: Payer: Self-pay | Admitting: Family Medicine

## 2023-06-06 DIAGNOSIS — G894 Chronic pain syndrome: Secondary | ICD-10-CM

## 2023-06-06 DIAGNOSIS — M51369 Other intervertebral disc degeneration, lumbar region without mention of lumbar back pain or lower extremity pain: Secondary | ICD-10-CM

## 2023-06-10 ENCOUNTER — Other Ambulatory Visit: Payer: Self-pay | Admitting: Family Medicine

## 2023-06-10 DIAGNOSIS — K219 Gastro-esophageal reflux disease without esophagitis: Secondary | ICD-10-CM

## 2023-06-10 NOTE — Telephone Encounter (Signed)
Requested medication (s) are due for refill today - provider review   Requested medication (s) are on the active medication list -yes  Future visit scheduled -no  Last refill: 05/27/23 #20  Notes to clinic: non delegated Rx  Requested Prescriptions  Pending Prescriptions Disp Refills   ondansetron (ZOFRAN-ODT) 4 MG disintegrating tablet [Pharmacy Med Name: ONDANSETRON ODT 4 MG TABLET] 18 tablet     Sig: DISSOLVE 1 TABLET IN THE MOUTH EVERY 8 HOURS AS NEEDED     Not Delegated - Gastroenterology: Antiemetics - ondansetron Failed - 06/10/2023 12:46 PM      Failed - This refill cannot be delegated      Failed - Valid encounter within last 6 months    Recent Outpatient Visits           2 years ago Chronic pain of left knee   [facility] Donita Brooks, MD   2 years ago Bilateral chronic knee pain   [facility] Tanya Nones, Priscille Heidelberg, MD   3 years ago Bilateral chronic knee pain   [facility] Tanya Nones, Priscille Heidelberg, MD   3 years ago Acute renal insufficiency   [facility] Donita Brooks, MD   4 years ago Benign essential HTN   [facility] Family Medicine Pickard, Priscille Heidelberg, MD              Passed - AST in normal range and within 360 days    AST  Date Value Ref Range Status  03/27/2023 14 10 - 35 U/L Final         Passed - ALT in normal range and within 360 days    ALT  Date Value Ref Range Status  03/27/2023 14 6 - 29 U/L Final            Requested Prescriptions  Pending Prescriptions Disp Refills   ondansetron (ZOFRAN-ODT) 4 MG disintegrating tablet [Pharmacy Med Name: ONDANSETRON ODT 4 MG TABLET] 18 tablet     Sig: DISSOLVE 1 TABLET IN THE MOUTH EVERY 8 HOURS AS NEEDED     Not Delegated - Gastroenterology: Antiemetics - ondansetron Failed - 06/10/2023 12:46 PM      Failed - This refill cannot be delegated      Failed - Valid encounter within last 6 months    Recent Outpatient Visits            2 years ago Chronic pain of left knee   [facility] Donita Brooks, MD   2 years ago Bilateral chronic knee pain   [facility] Tanya Nones, Priscille Heidelberg, MD   3 years ago Bilateral chronic knee pain   [facility] Tanya Nones, Priscille Heidelberg, MD   3 years ago Acute renal insufficiency   [facility] Tanya Nones, Priscille Heidelberg, MD   4 years ago Benign essential HTN   [facility] Pickard, Priscille Heidelberg, MD              Passed - AST in normal range and within 360 days    AST  Date Value Ref Range Status  03/27/2023 14 10 - 35 U/L Final         Passed - ALT in normal range and within 360 days    ALT  Date Value Ref Range Status  03/27/2023 14 6 - 29 U/L Final

## 2023-06-10 NOTE — Telephone Encounter (Signed)
Requested medication (s) are due for refill today: yes  Requested medication (s) are on the active medication list: yes  Last refill:  03/11/23 #60/2  Future visit scheduled: no  Notes to clinic:  Unable to refill per protocol, cannot delegate.    Requested Prescriptions  Pending Prescriptions Disp Refills   cyclobenzaprine (FLEXERIL) 10 MG tablet [Pharmacy Med Name: CYCLOBENZAPRINE 10 MG TABLET] 60 tablet 2    Sig: TAKE ONE TABLET BY MOUTH THREE TIMES A DAY AS NEEDED FOR MUSCLE SPASMS     Not Delegated - Analgesics:  Muscle Relaxants Failed - 06/06/2023 11:24 AM      Failed - This refill cannot be delegated      Failed - Valid encounter within last 6 months    Recent Outpatient Visits           2 years ago Chronic pain of left knee   [facility] Donita Brooks, MD   2 years ago Bilateral chronic knee pain   [facility] Donita Brooks, MD   3 years ago Bilateral chronic knee pain   [facility] Tanya Nones, Priscille Heidelberg, MD   3 years ago Acute renal insufficiency   [facility] Donita Brooks, MD   4 years ago Benign essential HTN   [facility] Pickard, Priscille Heidelberg, MD

## 2023-06-16 ENCOUNTER — Telehealth: Payer: Self-pay

## 2023-06-16 ENCOUNTER — Ambulatory Visit
Admission: RE | Admit: 2023-06-16 | Discharge: 2023-06-16 | Disposition: A | Discharge status: Home / Self Care | Payer: 59 | Source: Ambulatory Visit | Attending: Hematology and Oncology | Admitting: Hematology and Oncology

## 2023-06-16 ENCOUNTER — Other Ambulatory Visit: Payer: Self-pay | Admitting: Family Medicine

## 2023-06-16 ENCOUNTER — Telehealth: Payer: Self-pay | Admitting: *Deleted

## 2023-06-16 ENCOUNTER — Other Ambulatory Visit: Payer: Self-pay | Admitting: Hematology and Oncology

## 2023-06-16 DIAGNOSIS — Z9889 Other specified postprocedural states: Secondary | ICD-10-CM | POA: Diagnosis not present

## 2023-06-16 DIAGNOSIS — N6320 Unspecified lump in the left breast, unspecified quadrant: Secondary | ICD-10-CM | POA: Diagnosis not present

## 2023-06-16 DIAGNOSIS — Z170 Estrogen receptor positive status [ER+]: Secondary | ICD-10-CM

## 2023-06-16 MED ORDER — ANASTROZOLE 1 MG PO TABS
1.0000 mg | ORAL_TABLET | Freq: Every day | ORAL | 0 refills | Status: DC
Start: 2023-06-16 — End: 2023-07-07

## 2023-06-16 MED ORDER — TRAMADOL HCL 50 MG PO TABS: 50.0000 mg | ORAL_TABLET | Freq: Four times a day (QID) | ORAL | 0 refills | Status: DC | PRN

## 2023-06-16 NOTE — Telephone Encounter (Signed)
Requested medication (s) are due for refill today: Yes  Requested medication (s) are on the active medication list: Yes  Last refill:  05/27/23  Future visit scheduled: No  Notes to clinic:  Unable to refill per protocol, cannot delegate.      Requested Prescriptions  Pending Prescriptions Disp Refills   ondansetron (ZOFRAN-ODT) 4 MG disintegrating tablet [Pharmacy Med Name: ONDANSETRON ODT 4 MG TABLET] 18 tablet     Sig: DISSOLVE 1 TABLET IN THE MOUTH EVERY 8 HOURS AS NEEDED     Not Delegated - Gastroenterology: Antiemetics - ondansetron Failed - 06/16/2023 10:58 AM      Failed - This refill cannot be delegated      Failed - Valid encounter within last 6 months    Recent Outpatient Visits           2 years ago Chronic pain of left knee   [facility] Donita Brooks, MD   2 years ago Bilateral chronic knee pain   [facility] Tanya Nones, Priscille Heidelberg, MD   3 years ago Bilateral chronic knee pain   [facility] Tanya Nones, Priscille Heidelberg, MD   3 years ago Acute renal insufficiency   [facility] Donita Brooks, MD   4 years ago Benign essential HTN   [facility] Pickard, Priscille Heidelberg, MD              Passed - AST in normal range and within 360 days    AST  Date Value Ref Range Status  03/27/2023 14 10 - 35 U/L Final         Passed - ALT in normal range and within 360 days    ALT  Date Value Ref Range Status  03/27/2023 14 6 - 29 U/L Final

## 2023-06-16 NOTE — Telephone Encounter (Signed)
Prescription Request  06/16/2023  LOV: 03/27/2023  What is the name of the medication or equipment? cyclobenzaprine (FLEXERIL) 10 MG tablet [[id number]]   Have you contacted your pharmacy to request a refill? Yes   Which pharmacy would you like this sent to?  [facility] [id number] [place], [place] - [address] [place] [address] Phone: [phone] Fax: [phone]    Patient notified that their request is being sent to the clinical staff for review and that they should receive a response within 2 business days.   Please advise at [facility] [phone]

## 2023-06-16 NOTE — Telephone Encounter (Signed)
Copied from CRM [id number]. Topic: Clinical - Medication Refill >> Jun 16, 2023 12:10 PM Clayton Bibles wrote: Most Recent Primary Care Visit:  Provider: Lynnea Ferrier T  Department[facility]  Visit Type: SAME DAY  Date: 03/27/2023  Medication: Tramadal  Has the patient contacted their pharmacy? Yes (Agent: If no, request that the patient contact the pharmacy for the refill. If patient does not wish to contact the pharmacy document the reason why and proceed with request.) (Agent: If yes, when and what did the pharmacy advise?)  Is this the correct pharmacy for this prescription? Yes If no, delete pharmacy and type the correct one.  This is the patient's preferred pharmacy:  [facility] [id number] [place], [place] - [address] [place] [address] Phone: [phone] Fax: [phone]   Has the prescription been filled recently? No  Is the patient out of the medication? Yes  Has the patient been seen for an appointment in the last year OR does the patient have an upcoming appointment? Yes  Can we respond through MyChart? No  Agent: Please be advised that Rx refills may take up to 3 business days. We ask that you follow-up with your pharmacy.

## 2023-06-17 ENCOUNTER — Telehealth: Payer: Self-pay | Admitting: Hematology and Oncology

## 2023-06-17 NOTE — Telephone Encounter (Signed)
Called patient regarding upcoming appointment received no answer. Voicemail box not set up

## 2023-06-17 NOTE — Telephone Encounter (Signed)
Late entry for 06/16/23: Patient called for refill of Anastrozole, takes 1 mg daily.  States she is completely out and that pharmacy gave her 3 tablets on Saturday to last her until Monday when she could call office.   Message given to Dr. Al Pimple to review for refill.  Per Dr. Al Pimple - refill sent. Please schedule patient for follow up.   Contacted patient x 2 on 06/17/23 too advise refill sent and that Dr. Al Pimple requested appt for patient.  MB full, unable to leave message. Schedule message sent MyChart message sent to patient to advise refill sent. CC scheduling will contact her as Dr. Al Pimple requested appt for patient.

## 2023-06-23 ENCOUNTER — Other Ambulatory Visit: Payer: Self-pay | Admitting: Family Medicine

## 2023-06-23 ENCOUNTER — Telehealth: Payer: Self-pay

## 2023-06-23 DIAGNOSIS — M51369 Other intervertebral disc degeneration, lumbar region without mention of lumbar back pain or lower extremity pain: Secondary | ICD-10-CM

## 2023-06-23 DIAGNOSIS — G894 Chronic pain syndrome: Secondary | ICD-10-CM

## 2023-06-23 MED ORDER — CYCLOBENZAPRINE HCL 10 MG PO TABS: ORAL_TABLET | ORAL | 2 refills | Status: DC

## 2023-06-23 NOTE — Telephone Encounter (Signed)
Prescription Request  06/23/2023  LOV: 03/27/23  What is the name of the medication or equipment? cyclobenzaprine (FLEXERIL) 10 MG tablet [[id number]]   Have you contacted your pharmacy to request a refill? Yes   Which pharmacy would you like this sent to?  [facility] [id number] [place], [place] - [address] [place] [address] Phone: [phone] Fax: [phone]    Patient notified that their request is being sent to the clinical staff for review and that they should receive a response within 2 business days.   Please advise at [facility] [phone]

## 2023-06-25 ENCOUNTER — Other Ambulatory Visit: Payer: Self-pay

## 2023-06-25 DIAGNOSIS — K219 Gastro-esophageal reflux disease without esophagitis: Secondary | ICD-10-CM

## 2023-06-25 NOTE — Telephone Encounter (Signed)
Prescription Request  06/25/2023  LOV: 03/27/23  What is the name of the medication or equipment? ondansetron (ZOFRAN-ODT) 4 MG disintegrating tablet [[id number]]   Have you contacted your pharmacy to request a refill? Yes   Which pharmacy would you like this sent to?  [facility] [id number] [place], [place] - [address] [place] [address] Phone: [phone] Fax: [phone]    Patient notified that their request is being sent to the clinical staff for review and that they should receive a response within 2 business days.   Please advise at [facility] [phone]

## 2023-06-26 NOTE — Telephone Encounter (Signed)
Requested medications are due for refill today.  yes  Requested medications are on the active medications list.  yes  Last refill. 05/27/2023 #20 0 rf  Future visit scheduled.   no  Notes to clinic.  Refill not delegated.    Requested Prescriptions  Pending Prescriptions Disp Refills   ondansetron (ZOFRAN-ODT) 4 MG disintegrating tablet 20 tablet 0    Sig: Take 1 tablet (4 mg total) by mouth every 8 (eight) hours as needed.     Not Delegated - Gastroenterology: Antiemetics - ondansetron Failed - 06/26/2023  7:53 AM      Failed - This refill cannot be delegated      Failed - Valid encounter within last 6 months    Recent Outpatient Visits           2 years ago Chronic pain of left knee   [facility] Donita Brooks, MD   2 years ago Bilateral chronic knee pain   [facility] Tanya Nones, Priscille Heidelberg, MD   3 years ago Bilateral chronic knee pain   [facility] Tanya Nones, Priscille Heidelberg, MD   3 years ago Acute renal insufficiency   [facility] Donita Brooks, MD   4 years ago Benign essential HTN   [facility] Pickard, Priscille Heidelberg, MD              Passed - AST in normal range and within 360 days    AST  Date Value Ref Range Status  03/27/2023 14 10 - 35 U/L Final         Passed - ALT in normal range and within 360 days    ALT  Date Value Ref Range Status  03/27/2023 14 6 - 29 U/L Final

## 2023-07-06 ENCOUNTER — Other Ambulatory Visit: Payer: Self-pay | Admitting: Family Medicine

## 2023-07-06 DIAGNOSIS — I10 Essential (primary) hypertension: Secondary | ICD-10-CM

## 2023-07-06 DIAGNOSIS — F419 Anxiety disorder, unspecified: Secondary | ICD-10-CM

## 2023-07-07 ENCOUNTER — Other Ambulatory Visit: Payer: Self-pay | Admitting: Family Medicine

## 2023-07-07 ENCOUNTER — Other Ambulatory Visit: Payer: Self-pay | Admitting: *Deleted

## 2023-07-07 DIAGNOSIS — Z170 Estrogen receptor positive status [ER+]: Secondary | ICD-10-CM

## 2023-07-07 DIAGNOSIS — I10 Essential (primary) hypertension: Secondary | ICD-10-CM

## 2023-07-07 MED ORDER — ANASTROZOLE 1 MG PO TABS: 1.0000 mg | ORAL_TABLET | Freq: Every day | ORAL | 2 refills | Status: DC

## 2023-07-08 ENCOUNTER — Other Ambulatory Visit: Payer: Self-pay

## 2023-07-08 DIAGNOSIS — I10 Essential (primary) hypertension: Secondary | ICD-10-CM

## 2023-07-08 MED ORDER — LOSARTAN POTASSIUM 100 MG PO TABS: 100.0000 mg | ORAL_TABLET | Freq: Every day | ORAL | 1 refills | Status: DC

## 2023-07-10 ENCOUNTER — Other Ambulatory Visit: Payer: Self-pay | Admitting: Family Medicine

## 2023-07-11 ENCOUNTER — Other Ambulatory Visit: Payer: Self-pay | Admitting: Family Medicine

## 2023-07-11 DIAGNOSIS — K219 Gastro-esophageal reflux disease without esophagitis: Secondary | ICD-10-CM

## 2023-07-11 NOTE — Telephone Encounter (Signed)
 Prescription Request  07/11/2023  LOV: 03/27/2023  What is the name of the medication or equipment? ondansetron  (ZOFRAN -ODT) 4 MG disintegrating tablet   Have you contacted your pharmacy to request a refill? Yes   Which pharmacy would you like this sent to?  [facility] [id number] [place], [place] - [address] [place] [address] Phone: [phone] Fax: [phone]    Patient notified that their request is being sent to the clinical staff for review and that they should receive a response within 2 business days.   Please advise at [facility] [phone]

## 2023-07-15 NOTE — Telephone Encounter (Signed)
 Requested medications are due for refill today.  yes  Requested medications are on the active medications list.  yes  Last refill. 05/27/2023 #20 0 rf  Future visit scheduled.   no  Notes to clinic.  Refill not delegated.    Requested Prescriptions  Pending Prescriptions Disp Refills   ondansetron  (ZOFRAN -ODT) 4 MG disintegrating tablet 20 tablet 0    Sig: Take 1 tablet (4 mg total) by mouth every 8 (eight) hours as needed.     Not Delegated - Gastroenterology: Antiemetics - ondansetron  Failed - 07/15/2023  7:54 AM      Failed - This refill cannot be delegated      Failed - Valid encounter within last 6 months    Recent Outpatient Visits           2 years ago Chronic pain of left knee   [facility] Duanne Butler [name], MD   2 years ago Bilateral chronic knee pain   [facility] Duanne, Butler [name], MD   3 years ago Bilateral chronic knee pain   [facility] Duanne, Butler [name], MD   4 years ago Acute renal insufficiency   [facility] Duanne Butler [name], MD   4 years ago Benign essential HTN   [facility] Pickard, Butler [name], MD              Passed - AST in normal range and within 360 days    AST  Date Value Ref Range Status  03/27/2023 14 10 - 35 U/L Final         Passed - ALT in normal range and within 360 days    ALT  Date Value Ref Range Status  03/27/2023 14 6 - 29 U/L Final

## 2023-07-16 ENCOUNTER — Inpatient Hospital Stay: Payer: 59 | Attending: Hematology and Oncology | Admitting: Hematology and Oncology

## 2023-07-21 ENCOUNTER — Other Ambulatory Visit: Payer: Self-pay | Admitting: Family Medicine

## 2023-07-21 DIAGNOSIS — K219 Gastro-esophageal reflux disease without esophagitis: Secondary | ICD-10-CM

## 2023-07-21 NOTE — Telephone Encounter (Signed)
 Prescription Request  07/21/2023  LOV: 03/27/2023  What is the name of the medication or equipment?   ondansetron  (ZOFRAN -ODT) 4 MG disintegrating tablet   Have you contacted your pharmacy to request a refill? Yes   Which pharmacy would you like this sent to?  [facility] [id number] [place], [place] - [address] [place] [address] Phone: [phone] Fax: [phone]    Patient notified that their request is being sent to the clinical staff for review and that they should receive a response within 2 business days.   Please advise pharmacist

## 2023-07-22 NOTE — Telephone Encounter (Signed)
 Duplicate request, last refilled 07/10/23.  Requested Prescriptions  Pending Prescriptions Disp Refills   traMADol  (ULTRAM ) 50 MG tablet [Pharmacy Med Name: traMADol  HCL 50MG  TABLET] 120 tablet     Sig: TAKE 1 TABLET BY MOUTH EVERY 6 HOURS AS NEEDED     Not Delegated - Analgesics:  Opioid Agonists Failed - 07/22/2023  2:40 PM      Failed - This refill cannot be delegated      Failed - Urine Drug Screen completed in last 360 days      Failed - Valid encounter within last 3 months    Recent Outpatient Visits           2 years ago Chronic pain of left knee   [facility] Duanne Butler [name], MD   2 years ago Bilateral chronic knee pain   [facility] Duanne Butler [name], MD   3 years ago Bilateral chronic knee pain   [facility] Duanne, Butler [name], MD   4 years ago Acute renal insufficiency   [facility] Duanne Butler [name], MD   4 years ago Benign essential HTN   [facility] Pickard, Butler [name], MD

## 2023-07-28 ENCOUNTER — Telehealth: Payer: Self-pay

## 2023-07-28 NOTE — Telephone Encounter (Signed)
Prescription Request  07/28/2023  LOV: 03/27/23  What is the name of the medication or equipment? ondansetron (ZOFRAN-ODT) 4 MG disintegrating tablet [[id number]]   Have you contacted your pharmacy to request a refill? Yes   Which pharmacy would you like this sent to?  [facility] [id number] [place], [place] - [address] [place] [address] Phone: [phone] Fax: [phone]    Patient notified that their request is being sent to the clinical staff for review and that they should receive a response within 2 business days.   Please advise at [facility] [phone]

## 2023-07-29 ENCOUNTER — Other Ambulatory Visit: Payer: Self-pay | Admitting: Family Medicine

## 2023-07-29 DIAGNOSIS — K219 Gastro-esophageal reflux disease without esophagitis: Secondary | ICD-10-CM

## 2023-07-29 MED ORDER — ONDANSETRON 4 MG PO TBDP: 4.0000 mg | ORAL_TABLET | Freq: Three times a day (TID) | ORAL | 0 refills | Status: DC | PRN

## 2023-07-29 NOTE — Telephone Encounter (Signed)
Request is too soon for refill, last refill 04/29/23 for 90 and 1 refill.   Requested Prescriptions  Pending Prescriptions Disp Refills   omeprazole (PRILOSEC) 20 MG capsule [Pharmacy Med Name: OMEPRAZOLE DR 20 MG CAPSULE] 90 capsule 1    Sig: TAKE 1 CAPSULE BY MOUTH DAILY     Gastroenterology: Proton Pump Inhibitors Failed - 07/29/2023  4:12 PM      Failed - Valid encounter within last 12 months    Recent Outpatient Visits           2 years ago Chronic pain of left knee   [facility] Donita Brooks, MD   2 years ago Bilateral chronic knee pain   [facility] Donita Brooks, MD   3 years ago Bilateral chronic knee pain   [facility] Tanya Nones, Priscille Heidelberg, MD   4 years ago Acute renal insufficiency   [facility] Donita Brooks, MD   4 years ago Benign essential HTN   [facility] Pickard, Priscille Heidelberg, MD

## 2023-08-03 ENCOUNTER — Other Ambulatory Visit: Payer: Self-pay | Admitting: Family Medicine

## 2023-08-08 ENCOUNTER — Ambulatory Visit (INDEPENDENT_AMBULATORY_CARE_PROVIDER_SITE_OTHER): Payer: 59 | Admitting: Family Medicine

## 2023-08-08 ENCOUNTER — Other Ambulatory Visit: Payer: Self-pay | Admitting: Family Medicine

## 2023-08-08 ENCOUNTER — Encounter: Payer: Self-pay | Admitting: Family Medicine

## 2023-08-08 VITALS — BP 100/70 | HR 79 | Temp 97.9°F | Ht 63.0 in | Wt 122.0 lb

## 2023-08-08 DIAGNOSIS — M25562 Pain in left knee: Secondary | ICD-10-CM

## 2023-08-08 DIAGNOSIS — M25561 Pain in right knee: Secondary | ICD-10-CM

## 2023-08-08 DIAGNOSIS — I10 Essential (primary) hypertension: Secondary | ICD-10-CM

## 2023-08-08 DIAGNOSIS — G8929 Other chronic pain: Secondary | ICD-10-CM

## 2023-08-08 DIAGNOSIS — K219 Gastro-esophageal reflux disease without esophagitis: Secondary | ICD-10-CM

## 2023-08-08 MED ORDER — TRAMADOL HCL 50 MG PO TABS: 50.0000 mg | ORAL_TABLET | Freq: Four times a day (QID) | ORAL | 0 refills | Status: DC | PRN

## 2023-08-08 NOTE — Progress Notes (Signed)
+   Subjective:    Patient ID: Erika Harvey, female    DOB: 1960/10/11, 63 y.o.   MRN: [id number]  Knee Pain   Patient presents today complaining of bilateral chronic knee pain.  She has had 2 previous knee surgeries.  Both knees hurt equally.  She complains of pain underneath the patella bilaterally.  She denies any laxity to varus or valgus stress.  She has been receiving cortisone injections periodically for the knee pain.  Last injection was 12/2022.   Past Medical History:  Diagnosis Date   Allergy    Anxiety    Arthritis    Breast cancer (HCC) 07/19/2021   Chronic low back pain    Fibroid 03/30/2015   Frequent UTI    Hypertension    Personal history of radiation therapy    Vaginal bleeding 03/30/2015   Past Surgical History:  Procedure Laterality Date   BACK SURGERY  05/16/2015   BREAST BIOPSY Left 07/19/2021   BREAST LUMPECTOMY Left 08/08/2021   BREAST LUMPECTOMY WITH RADIOACTIVE SEED AND SENTINEL LYMPH NODE BIOPSY Left 08/08/2021   Procedure: LEFT BREAST LUMPECTOMY WITH RADIOACTIVE SEED AND SENTINEL LYMPH NODE BIOPSY;  Surgeon: Abigail Miyamoto, MD;  Location: [facility];  Service: General;  Laterality: Left;   KNEE SURGERY     left   KNEE SURGERY Left 07/08/2012   WISDOM TOOTH EXTRACTION     Current Outpatient Medications on File Prior to Visit  Medication Sig Dispense Refill   anastrozole (ARIMIDEX) 1 MG tablet Take 1 tablet (1 mg total) by mouth daily. 90 tablet 2   atenolol (TENORMIN) 25 MG tablet TAKE 1 TABLET BY MOUTH DAILY 30 tablet 1   atorvastatin (LIPITOR) 20 MG tablet Take 1 tablet (20 mg total) by mouth daily. 90 tablet 3   calcium-vitamin D (OSCAL WITH D) 250-125 MG-UNIT per tablet Take 1 tablet by mouth daily.     cyclobenzaprine (FLEXERIL) 10 MG tablet TAKE ONE TABLET BY MOUTH THREE TIMES A DAY AS NEEDED FOR MUSCLE SPASMS 60 tablet 2   diazepam (VALIUM) 5 MG tablet TAKE 1 TABLET BY MOUTH 2 TIMES A DAY AS NEEDED FOR ANXIETY **NEEDS TO  MAKE APPOINMENT FOR FUTURE REFILLS** 60 tablet 3   diclofenac Sodium (VOLTAREN) 1 % GEL Apply 2 g topically 4 (four) times daily. 100 g 5   DULoxetine (CYMBALTA) 60 MG capsule Take 1 capsule (60 mg total) by mouth daily. 90 capsule 1   Harvey oil-omega-3 fatty acids 1000 MG capsule Take 2 capsules (2 g total) by mouth 2 (two) times daily. 14 capsule 0   fluticasone (FLONASE) 50 MCG/ACT nasal spray Place 2 sprays into both nostrils daily. 16 g 6   losartan (COZAAR) 100 MG tablet TAKE 1 TABLET BY MOUTH DAILY 30 tablet 11   losartan (COZAAR) 100 MG tablet Take 1 tablet (100 mg total) by mouth daily. 90 tablet 1   omeprazole (PRILOSEC) 20 MG capsule Take 1 capsule (20 mg total) by mouth daily. 90 capsule 1   ondansetron (ZOFRAN-ODT) 4 MG disintegrating tablet Take 1 tablet (4 mg total) by mouth every 8 (eight) hours as needed. 20 tablet 0   polyethylene glycol (MIRALAX) 17 g packet Take 17 g twice daily until you have a large bowel movement, then begin taking one packet daily as needed for constipation. 30 each 0   senna-docusate (SENOKOT-S) 8.6-50 MG tablet Take 1 tablet by mouth at bedtime as needed for mild constipation. 30 tablet 0   traMADol (  ULTRAM) 50 MG tablet TAKE 1 TABLET BY MOUTH EVERY 6 HOURS AS NEEDED 120 tablet 0   zolpidem (AMBIEN) 10 MG tablet TAKE 1 TABLET BY MOUTH EVERY NIGHT AT BEDTIME AS NEEDED FOR SLEEP 30 tablet 5   No current facility-administered medications on file prior to visit.   Allergies  Allergen Reactions   Baclofen    Norvasc [Amlodipine Besylate] Swelling and Rash   Social History   Socioeconomic History   Marital status: Married    Spouse name: Not on file   Number of children: Not on file   Years of education: Not on file   Highest education level: Not on file  Occupational History   Not on file  Tobacco Use   Smoking status: Never   Smokeless tobacco: Never  Vaping Use   Vaping status: Never Used  Substance and Sexual Activity   Alcohol use: Yes     Alcohol/week: 5.0 standard drinks of alcohol    Types: 5 Cans of beer per week   Drug use: No   Sexual activity: Yes    Birth control/protection: Pill  Other Topics Concern   Not on file  Social History Narrative   Not on file   Social Drivers of Health   Financial Resource Strain: Not on file  Food Insecurity: Not on file  Transportation Needs: Not on file  Physical Activity: Not on file  Stress: Not on file  Social Connections: Not on file  Intimate Partner Violence: Not on file     Review of Systems  All other systems reviewed and are negative.      Objective:   Physical Exam Vitals reviewed.  Constitutional:      Appearance: Normal appearance.  Cardiovascular:     Rate and Rhythm: Normal rate and regular rhythm.     Heart sounds: Normal heart sounds.  Pulmonary:     Effort: Pulmonary effort is normal. No respiratory distress.     Breath sounds: Normal breath sounds. No stridor. No wheezing, rhonchi or rales.  Chest:     Chest wall: No tenderness.  Abdominal:     General: Abdomen is flat. Bowel sounds are normal.     Palpations: Abdomen is soft.  Musculoskeletal:     Right knee: Bony tenderness and crepitus present. Decreased range of motion. No LCL laxity, MCL laxity, ACL laxity or PCL laxity.     Left knee: Bony tenderness and crepitus present. Decreased range of motion. No LCL laxity, MCL laxity, ACL laxity or PCL laxity. Neurological:     Mental Status: She is alert.     Soft tissue swelling anterior to knee.       Assessment & Plan:  Bilateral chronic knee pain  Likely due to osteoarthritis in both knees.  Using sterile technique, I injected the right knee with 2 cc lidocaine, 2 cc of Marcaine, and 2 cc of 40 mg/mL Kenalog.  Then using sterile technique I injected the left knee with 2 cc lidocaine, 2 cc of Marcaine, and 2 cc of 40 mg/mL Kenalog.  Patient can repeat cortisone injections in both knees every 3 months as long as beneficial.  However I  discussed platelet rich plasma injections with the patient.  She would like to meet with an orthopedist to see if they perform these injections as we do not perform them.  I will refer the patient to a local orthopedist office to see what other options she has.  In the meantime I refilled her tramadol.

## 2023-08-12 MED ORDER — TRIAMCINOLONE ACETONIDE 40 MG/ML IJ SUSP
80.0000 mg | Freq: Once | INTRAMUSCULAR | Status: AC
  Administered 2023-08-08: 80 mg via INTRA_ARTICULAR

## 2023-08-12 NOTE — Addendum Note (Signed)
Addended by: Darral Dash on: 08/12/2023 08:44 AM   Modules accepted: Orders

## 2023-08-22 ENCOUNTER — Other Ambulatory Visit: Payer: Self-pay | Admitting: Family Medicine

## 2023-08-22 DIAGNOSIS — G894 Chronic pain syndrome: Secondary | ICD-10-CM

## 2023-08-22 DIAGNOSIS — M51369 Other intervertebral disc degeneration, lumbar region without mention of lumbar back pain or lower extremity pain: Secondary | ICD-10-CM

## 2023-08-23 ENCOUNTER — Other Ambulatory Visit: Payer: Self-pay | Admitting: Family Medicine

## 2023-08-23 DIAGNOSIS — I10 Essential (primary) hypertension: Secondary | ICD-10-CM

## 2023-08-26 ENCOUNTER — Other Ambulatory Visit: Payer: Self-pay | Admitting: Family Medicine

## 2023-08-26 DIAGNOSIS — K219 Gastro-esophageal reflux disease without esophagitis: Secondary | ICD-10-CM

## 2023-08-26 MED ORDER — ONDANSETRON 4 MG PO TBDP: 4.0000 mg | ORAL_TABLET | Freq: Three times a day (TID) | ORAL | 0 refills | Status: DC | PRN

## 2023-08-26 NOTE — Telephone Encounter (Signed)
Copied from CRM [id number]. Topic: Clinical - Medication Refill >> Aug 26, 2023  8:28 AM Geroge Baseman wrote: Most Recent Primary Care Visit:  Provider: Lynnea Ferrier T  Department: [facility]  Visit Type: SAME DAY  Date: 08/08/2023  Medication: ondansetron (ZOFRAN-ODT) 4 MG disintegrating tablet  Has the patient contacted their pharmacy? Yes Said to call office  Is this the correct pharmacy for this prescription? Yes If no, delete pharmacy and type the correct one.  This is the patient's preferred pharmacy:  [facility] [id number] [place], [place] - [address] [place] [address] Phone: [phone] Fax: [phone]   Has the prescription been filled recently? No  Is the patient out of the medication? Yes  Has the patient been seen for an appointment in the last year OR does the patient have an upcoming appointment? No  Can we respond through MyChart? No  Agent: Please be advised that Rx refills may take up to 3 business days. We ask that you follow-up with your pharmacy.

## 2023-08-31 ENCOUNTER — Other Ambulatory Visit: Payer: Self-pay | Admitting: Family Medicine

## 2023-09-02 ENCOUNTER — Other Ambulatory Visit: Payer: Self-pay | Admitting: Family Medicine

## 2023-09-02 DIAGNOSIS — K219 Gastro-esophageal reflux disease without esophagitis: Secondary | ICD-10-CM

## 2023-09-02 NOTE — Telephone Encounter (Signed)
 Copied from CRM [id number]. Topic: Clinical - Medication Refill >> Sep 02, 2023 11:06 AM Dollene Primrose wrote: Most Recent Primary Care Visit:  Provider: Lynnea Ferrier T  Department: [facility]  Visit Type: SAME DAY  Date: 08/08/2023  Medication: ondansetron (ZOFRAN-ODT) 4 MG disintegrating tablet  Has the patient contacted their pharmacy? Yes-needs refills (Agent: If no, request that the patient contact the pharmacy for the refill. If patient does not wish to contact the pharmacy document the reason why and proceed with request.) (Agent: If yes, when and what did the pharmacy advise?)  Is this the correct pharmacy for this prescription? yes If no, delete pharmacy and type the correct one.  This is the patient's preferred pharmacy:  [facility] [id number] [place], [place] - [address] [place] [address] Phone: [phone] Fax: [phone]   Has the prescription been filled recently? yes  Is the patient out of the medication? yes  Has the patient been seen for an appointment in the last year OR does the patient have an upcoming appointment? yes  Can we respond through MyChart? yes  Agent: Please be advised that Rx refills may take up to 3 business days. We ask that you follow-up with your pharmacy.

## 2023-09-03 NOTE — Telephone Encounter (Signed)
 Copied from CRM [id number]. Topic: Clinical - Medication Refill >> Sep 02, 2023 11:06 AM Dollene Primrose wrote: Most Recent Primary Care Visit:  Provider: Lynnea Ferrier T  Department: [facility]  Visit Type: SAME DAY  Date: 08/08/2023  Medication: ondansetron (ZOFRAN-ODT) 4 MG disintegrating tablet  Has the patient contacted their pharmacy? Yes-needs refills (Agent: If no, request that the patient contact the pharmacy for the refill. If patient does not wish to contact the pharmacy document the reason why and proceed with request.) (Agent: If yes, when and what did the pharmacy advise?)  Is this the correct pharmacy for this prescription? yes If no, delete pharmacy and type the correct one.  This is the patient's preferred pharmacy:  [facility] [id number] [place], [place] - [address] [place] [address] Phone: [phone] Fax: [phone]   Has the prescription been filled recently? yes  Is the patient out of the medication? yes  Has the patient been seen for an appointment in the last year OR does the patient have an upcoming appointment? yes  Can we respond through MyChart? yes  Agent: Please be advised that Rx refills may take up to 3 business days. We ask that you follow-up with your pharmacy. >> Sep 03, 2023  9:36 AM Dennison Nancy wrote: Patient called on 09/03/23 checking on status of the medication refill

## 2023-09-04 ENCOUNTER — Other Ambulatory Visit: Payer: Self-pay | Admitting: Family Medicine

## 2023-09-07 IMAGING — MG MM PLC BREAST LOC DEV 1ST LESION INC MAMMO GUIDE*L*
4 series · 4 of 4 positions shown · non-contrast
Comparison: Previous exam(s).

CLINICAL DATA: Patient presents for radioactive seed localization a
left breast invasive lobular carcinoma prior to surgical excision.

EXAM:
MAMMOGRAPHIC GUIDED RADIOACTIVE SEED LOCALIZATION OF THE LEFT BREAST

[L LM (1 of 3)]
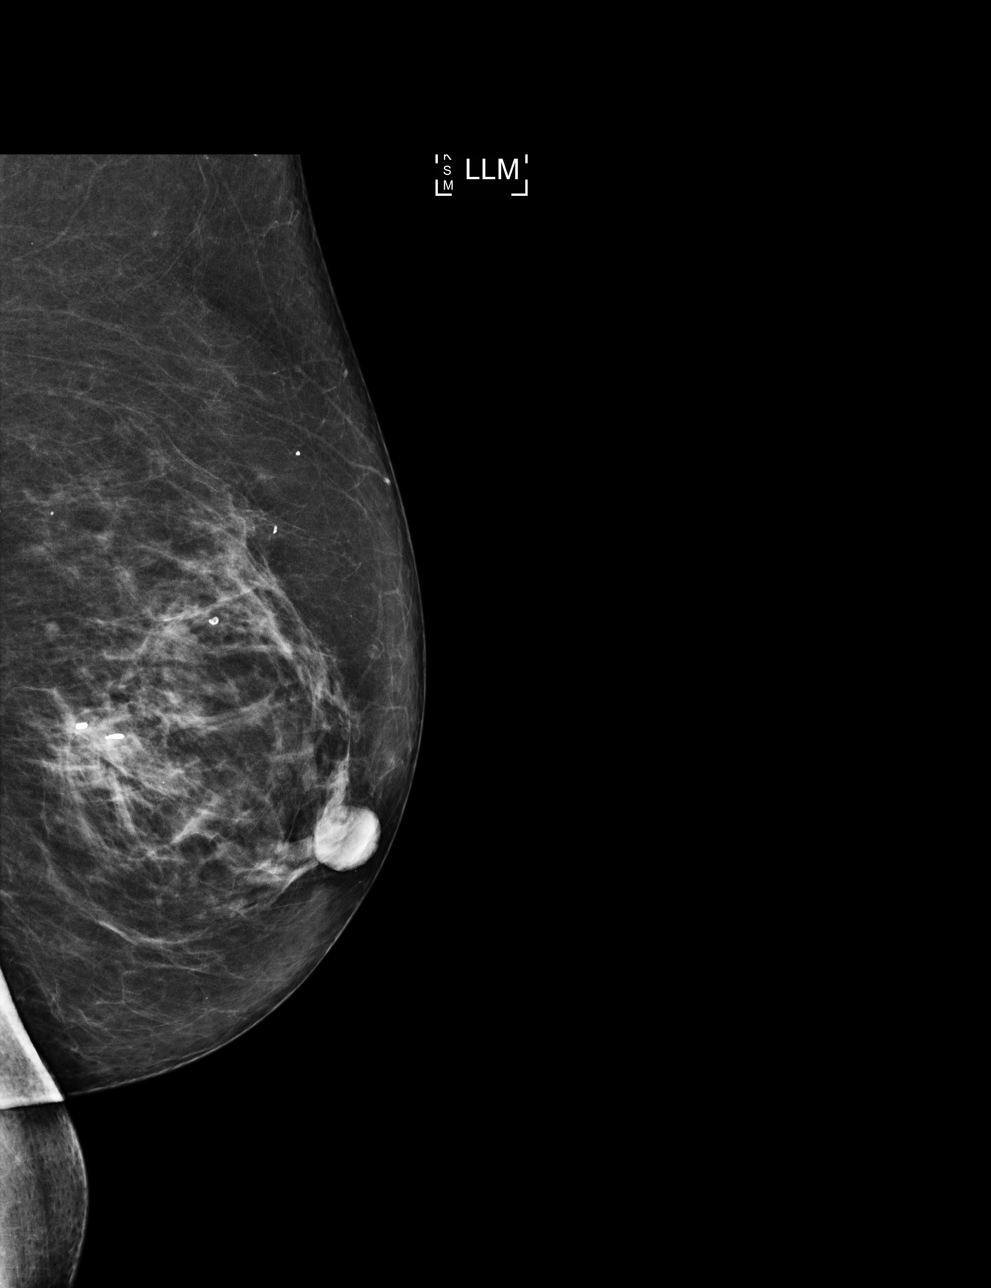

[L LM (2 of 3)]
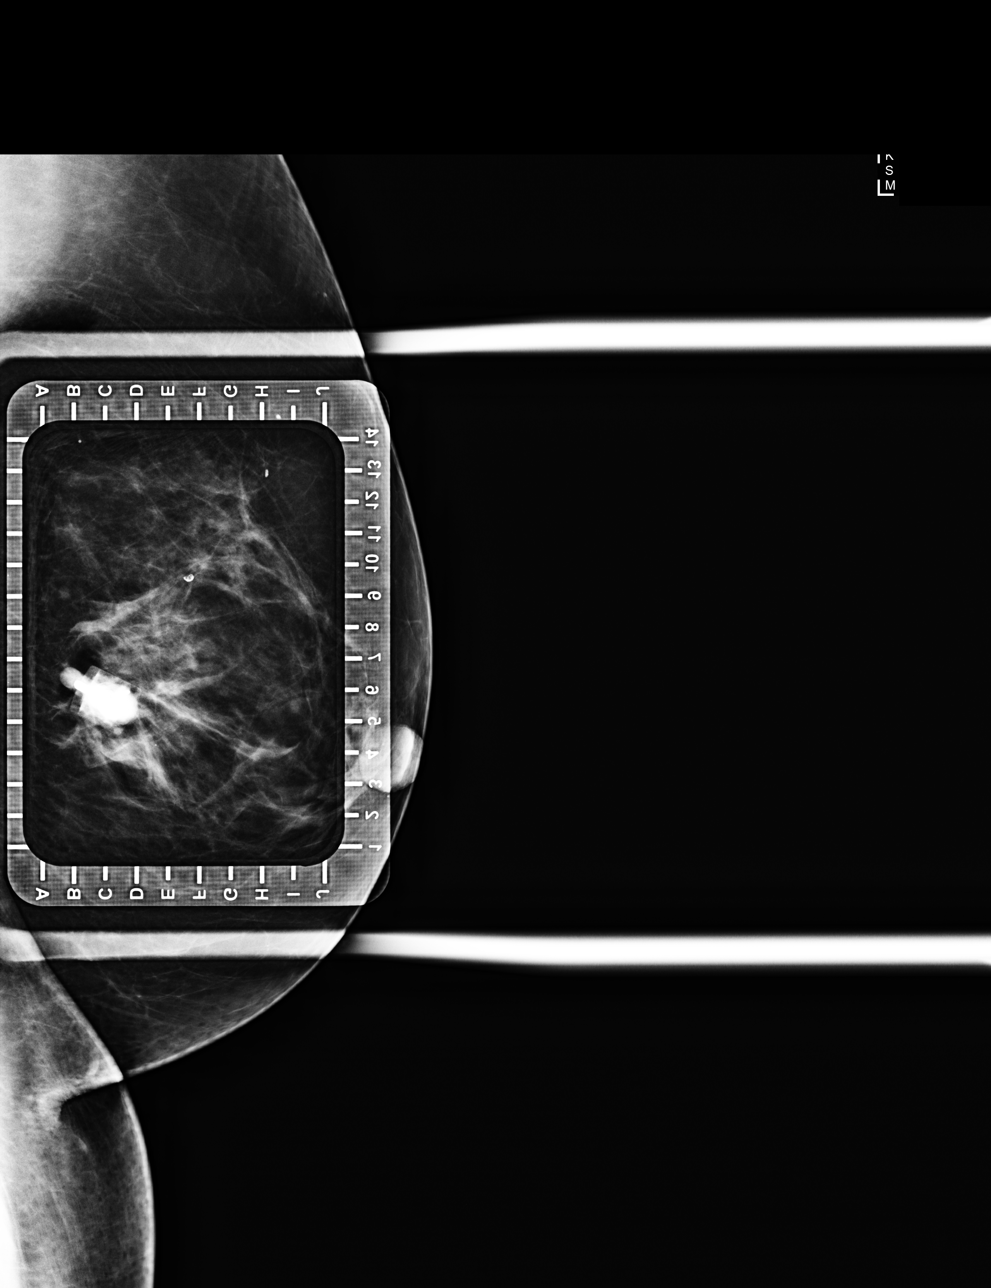

[L CC]
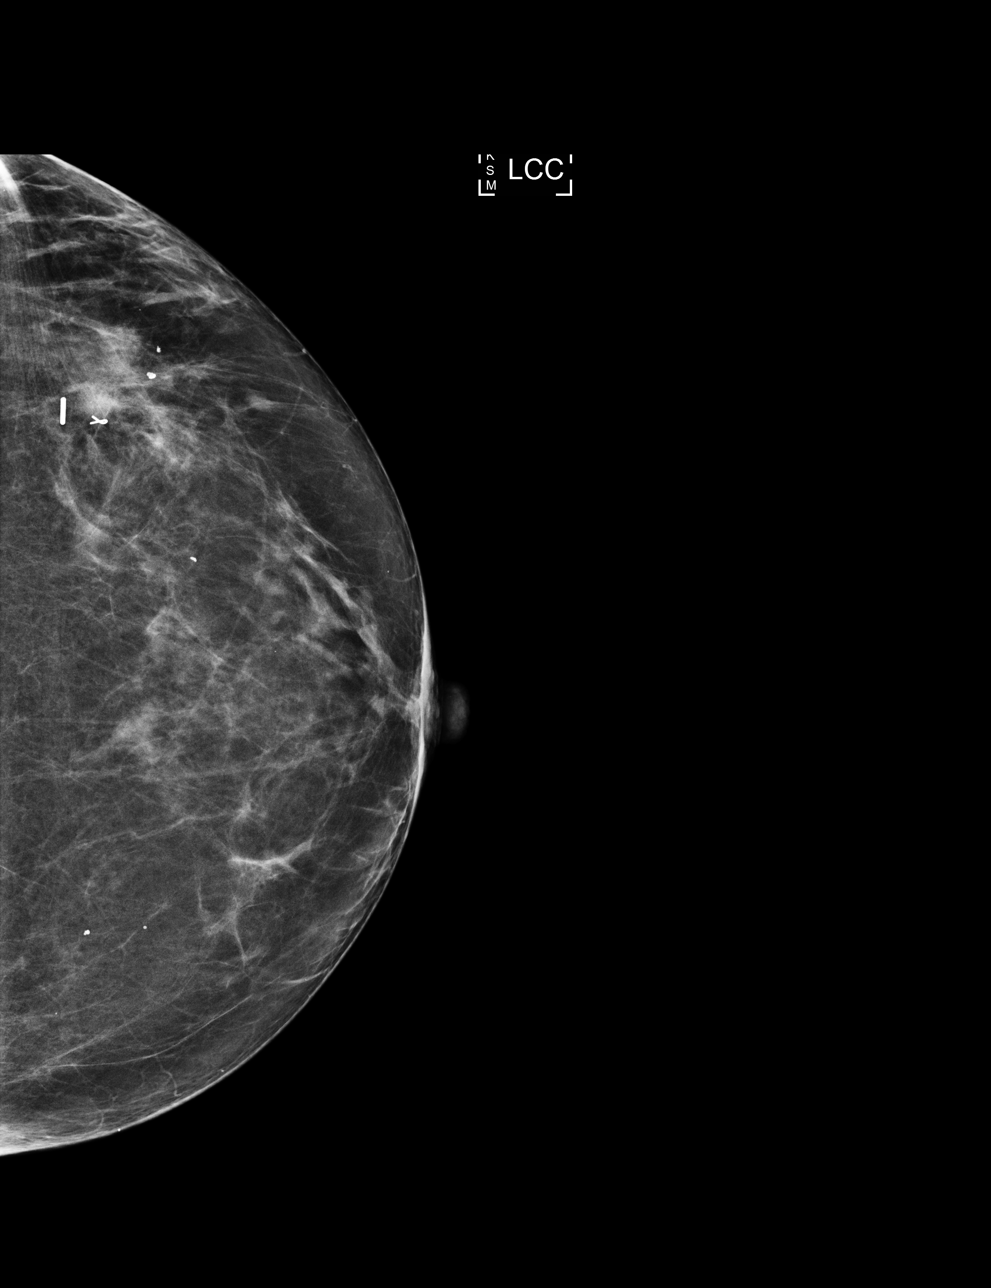

[L LM (3 of 3)]
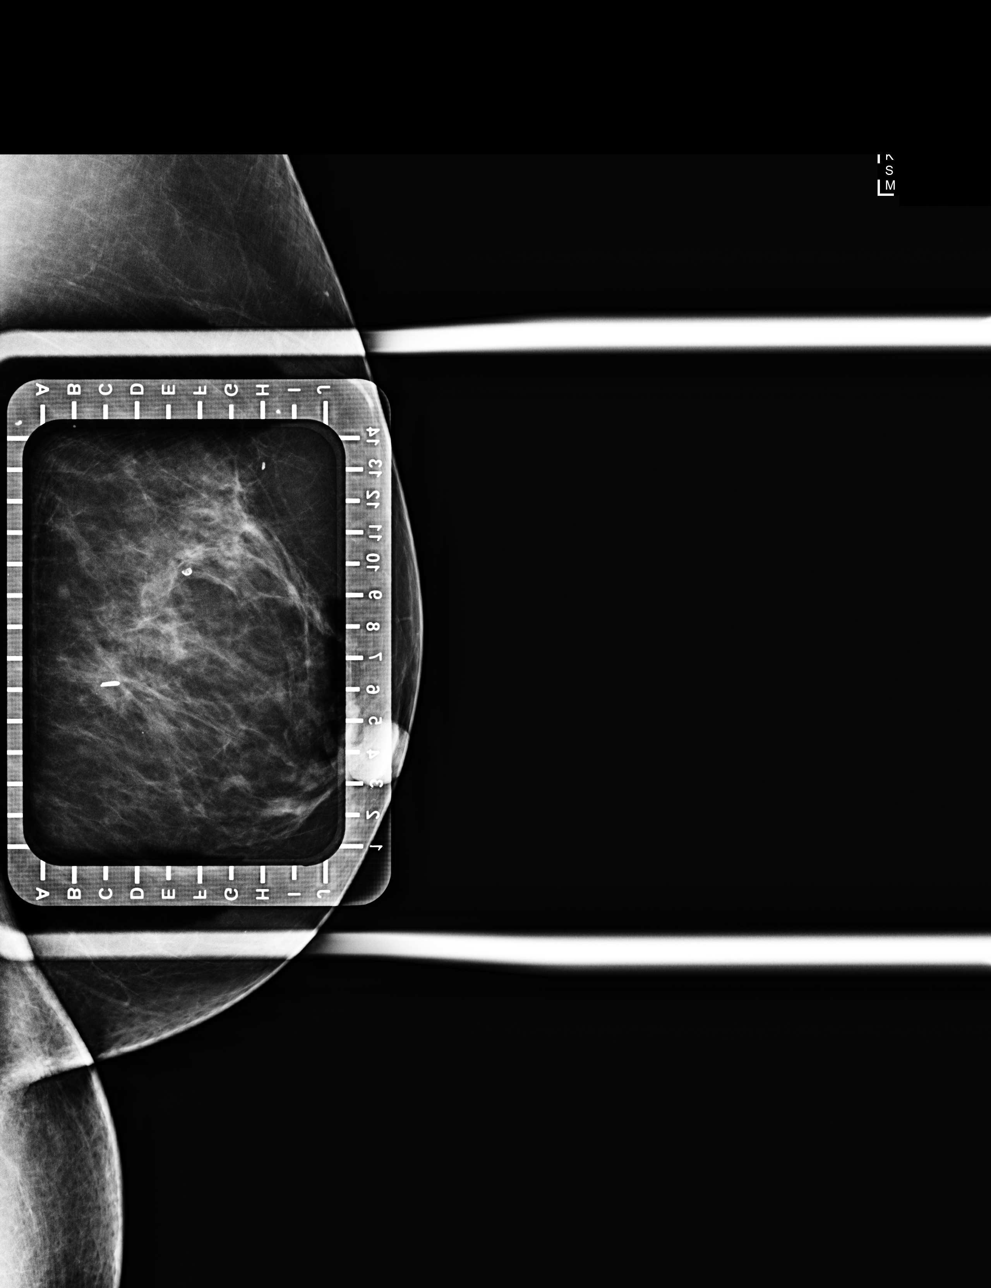

[4 of 4 positions shown; findings below may reference images not displayed]

FINDINGS: Patient presents for radioactive seed localization prior to surgical
excision. I met with the patient and we discussed the procedure of
seed localization including benefits and alternatives. We discussed
the high likelihood of a successful procedure. We discussed the
risks of the procedure including infection, bleeding, tissue injury
and further surgery. We discussed the low dose of radioactivity
involved in the procedure. Informed, written consent was given.

The usual time-out protocol was performed immediately prior to the
procedure.

Using mammographic guidance, sterile technique, 1% lidocaine and an
U-0DL radioactive seed, the ribbon shaped biopsy clip was localized
using a lateral approach. The follow-up mammogram images confirm the
seed in the expected location and were marked for Dr. Huert.

Follow-up survey of the patient confirms presence of the radioactive
seed.

Order number of U-0DL seed:  [id number].

Total activity:  0.246 millicuries reference Date: 06/06/2021

The patient tolerated the procedure well and was released from the
[REDACTED]. She was given instructions regarding seed removal.
IMPRESSION: Radioactive seed localization left breast. No apparent
complications.

## 2023-09-08 IMAGING — MG MM BREAST SURGICAL SPECIMEN
1 series · 2 of 2 positions shown · non-contrast
Comparison: Previous exam(s).

CLINICAL DATA: Post left breast lumpectomy.

EXAM:
SPECIMEN RADIOGRAPH OF THE LEFT BREAST

[Series 1: L · left · 0.07mm/px · 2 of 2 slices shown]
[im 1/2]
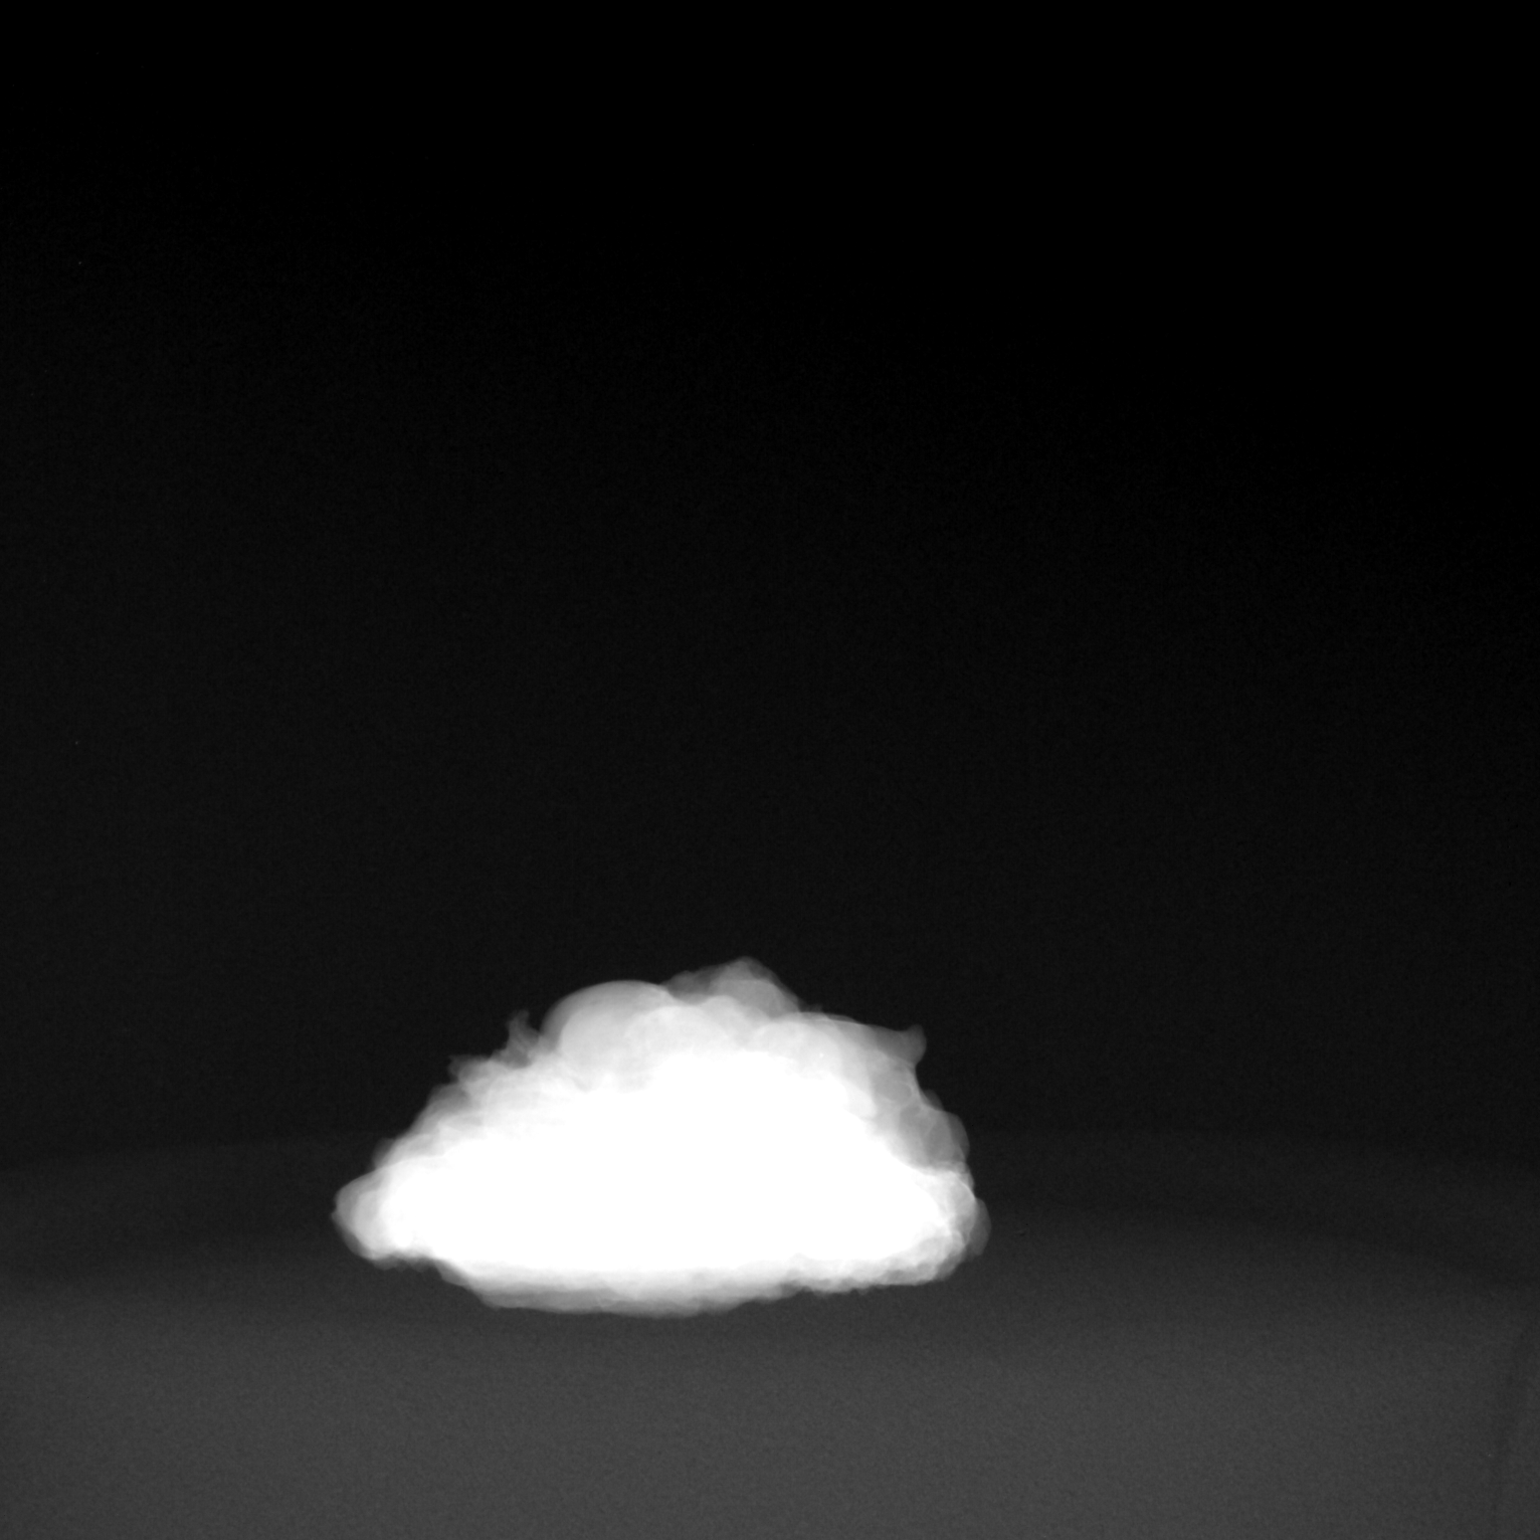
[im 2/2]
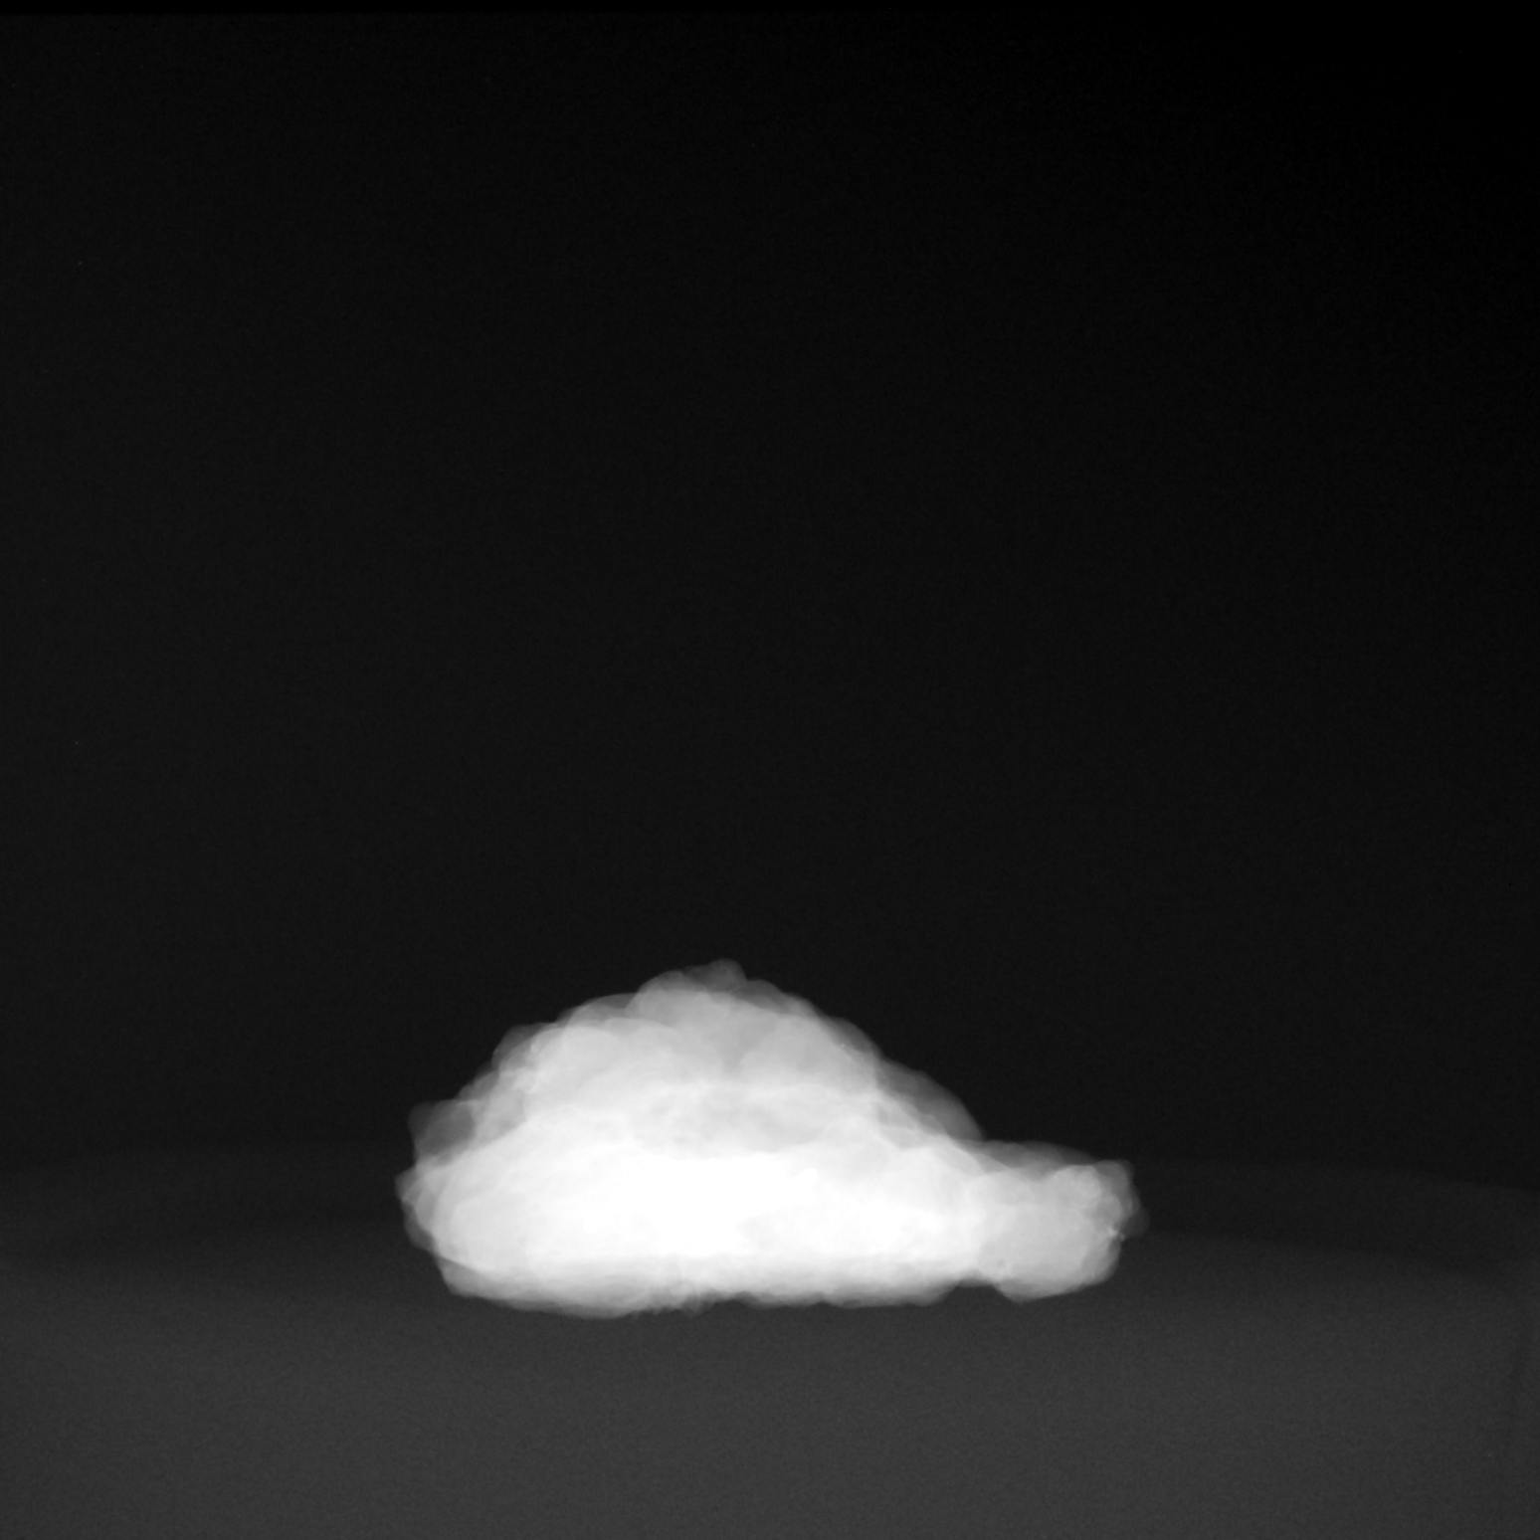

[2 of 2 positions shown; findings below may reference images not displayed]

FINDINGS: Status post excision of the left breast. The radioactive seed and
ribbon shaped biopsy marker clip are present, completely intact, and
were marked for pathology.
IMPRESSION: Specimen radiograph of the left breast.

## 2023-09-15 ENCOUNTER — Other Ambulatory Visit: Payer: Self-pay | Admitting: Family Medicine

## 2023-09-15 DIAGNOSIS — K219 Gastro-esophageal reflux disease without esophagitis: Secondary | ICD-10-CM

## 2023-09-15 MED ORDER — ONDANSETRON 4 MG PO TBDP: 4.0000 mg | ORAL_TABLET | Freq: Three times a day (TID) | ORAL | 0 refills | Status: DC | PRN

## 2023-09-15 NOTE — Telephone Encounter (Signed)
 Copied from CRM [id number]. Topic: Clinical - Medical Advice >> Sep 15, 2023  8:57 AM Gildardo Pounds wrote: Reason for CRM: Patient is inquiring about the facility you were going to give her info on for her. [facility] is not covered by [employer] but you were going to give her info on a provider that is covered. Callback number [phone]

## 2023-09-15 NOTE — Telephone Encounter (Signed)
 Copied from CRM [id number]. Topic: Clinical - Medication Refill >> Sep 15, 2023  8:54 AM Gildardo Pounds wrote: Most Recent Primary Care Visit:  Provider: Lynnea Ferrier T  Department: [facility]  Visit Type: SAME DAY  Date: 08/08/2023  Medication: ondansetron (ZOFRAN-ODT) 4 MG disintegrating tablet  Has the patient contacted their pharmacy? Yes (Agent: If no, request that the patient contact the pharmacy for the refill. If patient does not wish to contact the pharmacy document the reason why and proceed with request.) (Agent: If yes, when and what did the pharmacy advise?)No refills  Is this the correct pharmacy for this prescription? Yes If no, delete pharmacy and type the correct one.  This is the patient's preferred pharmacy:  [facility] [id number] [place], [place] - [address] [place] [address] Phone: [phone] Fax: [phone]   Has the prescription been filled recently? No  Is the patient out of the medication? Yes  Has the patient been seen for an appointment in the last year OR does the patient have an upcoming appointment? Yes  Can we respond through MyChart? Yes  Agent: Please be advised that Rx refills may take up to 3 business days. We ask that you follow-up with your pharmacy.

## 2023-09-21 ENCOUNTER — Other Ambulatory Visit: Payer: Self-pay | Admitting: Family Medicine

## 2023-09-21 DIAGNOSIS — F419 Anxiety disorder, unspecified: Secondary | ICD-10-CM

## 2023-09-21 DIAGNOSIS — F320 Major depressive disorder, single episode, mild: Secondary | ICD-10-CM

## 2023-09-23 NOTE — Telephone Encounter (Signed)
 Requested Prescriptions  Pending Prescriptions Disp Refills   DULoxetine (CYMBALTA) 60 MG capsule [Pharmacy Med Name: DULoxetine HCL DR 60 MG CAPSULE] 90 capsule 0    Sig: TAKE 1 CAPSULE BY MOUTH DAILY     Psychiatry: Antidepressants - SNRI - duloxetine Failed - 09/23/2023  8:56 AM      Failed - Completed PHQ-2 or PHQ-9 in the last 360 days      Failed - Valid encounter within last 6 months    Recent Outpatient Visits           2 years ago Chronic pain of left knee   [facility] Donita Brooks, MD   3 years ago Bilateral chronic knee pain   [facility] Tanya Nones, Priscille Heidelberg, MD   3 years ago Bilateral chronic knee pain   [facility] Tanya Nones, Priscille Heidelberg, MD   4 years ago Acute renal insufficiency   [facility] Donita Brooks, MD   4 years ago Benign essential HTN   [facility] Pickard, Priscille Heidelberg, MD              Passed - Cr in normal range and within 360 days    Creat  Date Value Ref Range Status  03/27/2023 1.05 0.50 - 1.05 mg/dL Final   Creatinine, Urine  Date Value Ref Range Status  09/03/2022 104 20 - 275 mg/dL Final         Passed - eGFR is 30 or above and within 360 days    GFR, Est African American  Date Value Ref Range Status  07/08/2019 71 > OR = 60 mL/min/1.72m2 Final   GFR, Est Non African American  Date Value Ref Range Status  07/08/2019 62 > OR = 60 mL/min/1.37m2 Final   eGFR  Date Value Ref Range Status  03/27/2023 60 > OR = 60 mL/min/1.65m2 Final         Passed - Last BP in normal range    BP Readings from Last 1 Encounters:  08/08/23 100/70

## 2023-09-25 ENCOUNTER — Telehealth: Payer: Self-pay | Admitting: *Deleted

## 2023-09-25 ENCOUNTER — Telehealth: Payer: Self-pay | Admitting: Hematology and Oncology

## 2023-09-25 NOTE — Telephone Encounter (Signed)
 This RN spoke with pt per her call stating she has been having ongoing nausea since starting the anastrozole.  She was reluctant to mention " because I was glad I had a medicine to save my life"  She states nausea is now all day - interfering with ability to eat and she states she is losing weight unintentionally.  She has been going to her primary MD for the nausea with out noted benefit and " he too thinks it may be the anastrozole just by elimination "  Per discussion recommended for pt to hold the anastrozole for 5 days- monitor symptoms and to call this RN on 3/26 with update. Before we either change medications or give other medication we need to verify the issue is related to the anastrozole.  Pt verbalized understanding and is in agreement to plan.

## 2023-09-26 ENCOUNTER — Inpatient Hospital Stay: Attending: Hematology and Oncology | Admitting: Hematology and Oncology

## 2023-09-26 VITALS — BP 135/83 | HR 84 | Temp 97.3°F | Resp 16 | Wt 116.9 lb

## 2023-09-26 DIAGNOSIS — Z923 Personal history of irradiation: Secondary | ICD-10-CM | POA: Diagnosis not present

## 2023-09-26 DIAGNOSIS — Z79899 Other long term (current) drug therapy: Secondary | ICD-10-CM | POA: Diagnosis not present

## 2023-09-26 DIAGNOSIS — C50412 Malignant neoplasm of upper-outer quadrant of left female breast: Secondary | ICD-10-CM | POA: Diagnosis not present

## 2023-09-26 DIAGNOSIS — Z170 Estrogen receptor positive status [ER+]: Secondary | ICD-10-CM | POA: Diagnosis not present

## 2023-09-26 DIAGNOSIS — Z79811 Long term (current) use of aromatase inhibitors: Secondary | ICD-10-CM | POA: Insufficient documentation

## 2023-09-26 NOTE — Assessment & Plan Note (Addendum)
 Erika Harvey is a 63 year old woman with history of stage Ia ER/PR positive breast cancer that was diagnosed in January 2023 status post lumpectomy, adjuvant radiation, and antiestrogen therapy with anastrozole daily which began in May 2023.  [name] has no clinical or radiographic signs of breast cancer recurrence.    Lobular breast cancer presenting with nausea - Monitor symptoms after holding medication. - Consider endoscopy if symptoms persist.  Nausea and weight loss Symptoms may be related to breast cancer medication. Discontinuation will assess medication as cause. - Hold breast cancer medication for two weeks. - Call in two weeks to assess symptoms. - If nausea persists, refer to gastroenterology for endoscopy.  Decreased bone density Decreased height may indicate bone density loss. Medication may contribute. - Review bone density scan results with primary care physician.

## 2023-09-26 NOTE — Progress Notes (Signed)
 [facility] Cancer Follow up:    Donita Brooks, MD [address] [place] [address]   DIAGNOSIS:  Cancer Staging  Malignant neoplasm of upper-outer quadrant of left breast in female, estrogen receptor positive (HCC) Staging form: Breast, AJCC 8th Edition - Clinical stage from 07/19/2021: Stage IA (cT1b, cN0, cM0, G2, ER+, PR+, HER2-) - Signed by Loa Socks, NP on 07/25/2021 Stage prefix: Initial diagnosis Histologic grading system: 3 grade system - Pathologic stage from 08/08/2021: Stage IA (pT1b, pN0, cM0, G3, ER+, PR+, HER2-, Oncotype DX score: 28) - Signed by Loa Socks, NP on 07/02/2022 Stage prefix: Initial diagnosis Multigene prognostic tests performed: Oncotype DX Recurrence score range: Greater than or equal to 11 Histologic grading system: 3 grade system   SUMMARY OF ONCOLOGIC HISTORY: Oncology History  Malignant neoplasm of upper-outer quadrant of left breast in female, estrogen receptor positive (HCC)  07/19/2021 Initial Diagnosis   Left breast biopsy at 2:30 o'clock: ILC, grade 2, ER 100%, PR 40%, Ki-67 10%, HER2 negative   07/19/2021 Cancer Staging   Staging form: Breast, AJCC 8th Edition - Clinical stage from 07/19/2021: Stage IA (cT1b, cN0, cM0, G2, ER+, PR+, HER2-) - Signed by Loa Socks, NP on 07/25/2021 Stage prefix: Initial diagnosis Histologic grading system: 3 grade system   08/08/2021 Surgery   Left breast lumpectomy: ILC 0.7cm, g 3, margins negative, 4 SLN negative   08/08/2021 Oncotype testing   Oncotype score results are as 28, distant recurrence risk at 9 years with tamoxifen alone is 17% and chemotherapy benefit was deemed greater than 15%. Risks and benefits of adjuvant chemotherapy reviewed and patient opted to forego treatment.    08/08/2021 Cancer Staging   Staging form: Breast, AJCC 8th Edition - Pathologic stage from 08/08/2021: Stage IA (pT1b, pN0, cM0, G3, ER+, PR+, HER2-, Oncotype DX  score: 28) - Signed by Loa Socks, NP on 07/02/2022 Stage prefix: Initial diagnosis Multigene prognostic tests performed: Oncotype DX Recurrence score range: Greater than or equal to 11 Histologic grading system: 3 grade system   09/13/2021 - 10/11/2021 Radiation Therapy   Site Technique Total Dose (Gy) Dose per Fx (Gy) Completed Fx Beam Energies  Breast, Left: Breast_L 3D 42.56/42.56 2.66 16/16 6XFFF  Breast, Left: Breast_L_Bst 3D 8/8 2 4/4 6X     11/2021 -  Anti-estrogen oral therapy   Anastrozole daily     CURRENT THERAPY: anastrozole  INTERVAL HISTORY  Erika Harvey is a 63 year old female with breast cancer who presents with nausea and weight loss. She is accompanied by her husband.  She is experiencing significant nausea and unintentional weight loss, currently weighing 116 pounds. The nausea is frequent and has impacted her appetite, leading to daily symptoms unless she takes a specific pill prescribed by Dr. Tanya Nones. Her primary care physician suspects her medication may be contributing to these symptoms.  Today marks the first day she has not taken her breast cancer medication.  She has a history of lobular breast cancer.   She reports a decrease in height from 5'4" to 5'3", which she attributes to potential bone density loss. She has undergone a bone density scan, but the results were not discussed with her by her family physician. She is unable to recall where she had the bone density scan done.   Patient Active Problem List   Diagnosis Date Noted   Malignant neoplasm of upper-outer quadrant of left breast in female, estrogen receptor positive (HCC) 07/25/2021  Hypoglycemia 09/15/2018   Insomnia 09/07/2015   Osteopenia 08/09/2015   Vaginal bleeding 03/30/2015   Fibroid 03/30/2015   GERD (gastroesophageal reflux disease) 11/10/2014   Generalized OA 03/29/2014   Lumbar herniated disc 02/09/2014    Class: Chronic   Chronic pain syndrome 02/09/2014    DDD (degenerative disc disease), lumbar 02/09/2014   Spondylolysis 02/09/2014   Lumbosacral radiculitis 02/09/2014   Chronic low back pain    Hypertension    Anxiety    Frequent UTI     is allergic to baclofen and norvasc [amlodipine besylate].  MEDICAL HISTORY: Past Medical History:  Diagnosis Date   Allergy    Anxiety    Arthritis    Breast cancer (HCC) 07/19/2021   Chronic low back pain    Fibroid 03/30/2015   Frequent UTI    Hypertension    Personal history of radiation therapy    Vaginal bleeding 03/30/2015    SURGICAL HISTORY: Past Surgical History:  Procedure Laterality Date   BACK SURGERY  05/16/2015   BREAST BIOPSY Left 07/19/2021   BREAST LUMPECTOMY Left 08/08/2021   BREAST LUMPECTOMY WITH RADIOACTIVE SEED AND SENTINEL LYMPH NODE BIOPSY Left 08/08/2021   Procedure: LEFT BREAST LUMPECTOMY WITH RADIOACTIVE SEED AND SENTINEL LYMPH NODE BIOPSY;  Surgeon: Abigail Miyamoto, MD;  Location: [facility];  Service: General;  Laterality: Left;   KNEE SURGERY     left   KNEE SURGERY Left 07/08/2012   WISDOM TOOTH EXTRACTION      SOCIAL HISTORY: Social History   Socioeconomic History   Marital status: Married    Spouse name: Not on file   Number of children: Not on file   Years of education: Not on file   Highest education level: Not on file  Occupational History   Not on file  Tobacco Use   Smoking status: Never   Smokeless tobacco: Never  Vaping Use   Vaping status: Never Used  Substance and Sexual Activity   Alcohol use: Yes    Alcohol/week: 5.0 standard drinks of alcohol    Types: 5 Cans of beer per week   Drug use: No   Sexual activity: Yes    Birth control/protection: Pill  Other Topics Concern   Not on file  Social History Narrative   Not on file   Social Drivers of Health   Financial Resource Strain: Not on file  Food Insecurity: Not on file  Transportation Needs: Not on file  Physical Activity: Not on file  Stress: Not on  file  Social Connections: Not on file  Intimate Partner Violence: Not on file    FAMILY HISTORY: Family History  Problem Relation Age of Onset   Breast cancer Mother    Heart disease Father 21   Diabetes Father    Colon cancer Neg Hx    Colon polyps Neg Hx     Review of Systems  Constitutional:  Negative for appetite change, chills, fatigue, fever and unexpected weight change.  HENT:   Negative for hearing loss, lump/mass and trouble swallowing.   Eyes:  Negative for eye problems and icterus.  Respiratory:  Negative for chest tightness, cough and shortness of breath.   Cardiovascular:  Negative for chest pain, leg swelling and palpitations.  Gastrointestinal:  Negative for abdominal distention, abdominal pain, constipation, diarrhea, nausea and vomiting.  Endocrine: Positive for hot flashes.  Genitourinary:  Negative for difficulty urinating.   Musculoskeletal:  Negative for arthralgias.  Skin:  Negative for itching and rash.  Neurological:  Negative for dizziness, extremity weakness, headaches and numbness.  Hematological:  Negative for adenopathy. Does not bruise/bleed easily.  Psychiatric/Behavioral:  Negative for depression. The patient is not nervous/anxious.       PHYSICAL EXAMINATION  ECOG PERFORMANCE STATUS: 1 - Symptomatic but completely ambulatory  Vitals:   09/26/23 1201  BP: 135/83  Pulse: 84  Resp: 16  Temp: (!) 97.3 F (36.3 C)  SpO2: 94%     Physical Exam Constitutional:      General: She is not in acute distress.    Appearance: Normal appearance. She is not toxic-appearing.  HENT:     Head: Normocephalic and atraumatic.  Eyes:     General: No scleral icterus. Cardiovascular:     Rate and Rhythm: Normal rate and regular rhythm.     Pulses: Normal pulses.     Heart sounds: Normal heart sounds.  Pulmonary:     Effort: Pulmonary effort is normal.     Breath sounds: Normal breath sounds.  Chest:     Comments: Left breast s/p lumpectomy and  radiation, no sign of local recurrence, right breast benign.   Abdominal:     General: Abdomen is flat. Bowel sounds are normal. There is no distension.     Palpations: Abdomen is soft.     Tenderness: There is no abdominal tenderness.  Musculoskeletal:        General: No swelling.     Cervical back: Neck supple.  Lymphadenopathy:     Cervical: No cervical adenopathy.  Skin:    General: Skin is warm and dry.     Findings: No rash.  Neurological:     General: No focal deficit present.     Mental Status: She is alert.  Psychiatric:        Mood and Affect: Mood normal.        Behavior: Behavior normal.     LABORATORY DATA:  None for this visit      ASSESSMENT and THERAPY PLAN:   Malignant neoplasm of upper-outer quadrant of left breast in female, estrogen receptor positive (HCC) Gaige is a 63 year old woman with history of stage Ia ER/PR positive breast cancer that was diagnosed in January 2023 status post lumpectomy, adjuvant radiation, and antiestrogen therapy with anastrozole daily which began in May 2023.  Ms Pelle has no clinical or radiographic signs of breast cancer recurrence.    Lobular breast cancer presenting with nausea - Monitor symptoms after holding medication. - Consider endoscopy if symptoms persist.  Nausea and weight loss Symptoms may be related to breast cancer medication. Discontinuation will assess medication as cause. - Hold breast cancer medication for two weeks. - Call in two weeks to assess symptoms. - If nausea persists, refer to gastroenterology for endoscopy.  Decreased bone density Decreased height may indicate bone density loss. Medication may contribute. - Review bone density scan results with primary care physician.     All questions were answered. The patient knows to call the clinic with any problems, questions or concerns. We can certainly see the patient much sooner if necessary.  Total encounter time:20 minutes*in  face-to-face visit time, chart review, lab review, care coordination, order entry, and documentation of the encounter time.   *Total Encounter Time as defined by the Centers for Medicare and Medicaid Services includes, in addition to the face-to-face time of a patient visit (documented in the note above) non-face-to-face time: obtaining and reviewing outside history, ordering and reviewing medications, tests or procedures, care coordination (communications with other health  care professionals or caregivers) and documentation in the medical record.

## 2023-09-29 ENCOUNTER — Other Ambulatory Visit: Payer: Self-pay | Admitting: Family Medicine

## 2023-09-29 ENCOUNTER — Telehealth: Payer: Self-pay

## 2023-09-29 DIAGNOSIS — I10 Essential (primary) hypertension: Secondary | ICD-10-CM

## 2023-09-29 NOTE — Telephone Encounter (Signed)
-----   Message from Charlie Pitter III sent at 09/26/2023  4:54 PM EDT ----- Dr. Al Pimple,  I can get her seen in the office within a couple of weeks, and I agree she will need an upper endoscopy. However, in the meantime, it would be helpful if your clinic would work on scheduling her for an abdominal pelvic CT scan to rule out any obstruction or mass.  _____  Cyndia Skeeters triage team,  Please add on this patient for an 11:20 AM clinic appointment with me on Thursday, April 3.  She has an appointment earlier that morning with oncology, but this is the only day I could see her for the next several weeks and it is an overbook at that.  H Danis ----- Message ----- From: Rachel Moulds, MD Sent: 09/26/2023  12:14 PM EDT To: Sherrilyn Rist, MD  Dr Myrtie Neither  Ms Michail Jewels had invasive lobular carcinoma of the breast, has severe nausea, loss of appetite and weight loss. She thinks its anastrozole, but its not a common side effect and its not a new medicine for her. 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  Thanks,

## 2023-09-29 NOTE — Telephone Encounter (Signed)
 Appointment scheduled. Attempted to contact the patient. No answer. Voicemail is full.

## 2023-09-29 NOTE — Telephone Encounter (Signed)
 Patient has been advised of appointment and confirmed.

## 2023-09-30 NOTE — Telephone Encounter (Signed)
 Requested medication (s) are due for refill today: yes  Requested medication (s) are on the active medication list: yes  Last refill:  08/25/23  Future visit scheduled: no  Notes to clinic:  Unable to refill per protocol, courtesy refill already given, routing for provider approval.      Requested Prescriptions  Pending Prescriptions Disp Refills   atenolol (TENORMIN) 25 MG tablet [Pharmacy Med Name: ATENOLOL 25 MG TABLET] 30 tablet 0    Sig: TAKE 1 TABLET BY MOUTH DAILY     Cardiovascular: Beta Blockers 2 Failed - 09/30/2023 11:18 AM      Failed - Valid encounter within last 6 months    Recent Outpatient Visits           2 years ago Chronic pain of left knee   [facility] Donita Brooks, MD   3 years ago Bilateral chronic knee pain   [facility] Tanya Nones, Priscille Heidelberg, MD   3 years ago Bilateral chronic knee pain   [facility] Tanya Nones, Priscille Heidelberg, MD   4 years ago Acute renal insufficiency   [facility] Donita Brooks, MD   4 years ago Benign essential HTN   [facility] Pickard, Priscille Heidelberg, MD              Passed - Cr in normal range and within 360 days    Creat  Date Value Ref Range Status  03/27/2023 1.05 0.50 - 1.05 mg/dL Final   Creatinine, Urine  Date Value Ref Range Status  09/03/2022 104 20 - 275 mg/dL Final         Passed - Last BP in normal range    BP Readings from Last 1 Encounters:  09/26/23 135/83         Passed - Last Heart Rate in normal range    Pulse Readings from Last 1 Encounters:  09/26/23 84

## 2023-10-01 ENCOUNTER — Other Ambulatory Visit: Payer: Self-pay | Admitting: Family Medicine

## 2023-10-01 DIAGNOSIS — I10 Essential (primary) hypertension: Secondary | ICD-10-CM

## 2023-10-02 NOTE — Telephone Encounter (Signed)
 Patient needs appt / CPE scheduled. One courtesy refill given 08/25/23.

## 2023-10-02 NOTE — Telephone Encounter (Signed)
 Requested medication (s) are due for refill today: yes  Requested medication (s) are on the active medication list: yes  Last refill:  08/25/23  Future visit scheduled: no  Notes to clinic:  Unable to refill per protocol, courtesy refill already given, routing for provider approval.      Requested Prescriptions  Pending Prescriptions Disp Refills   atenolol (TENORMIN) 25 MG tablet [Pharmacy Med Name: ATENOLOL 25 MG TABLET] 30 tablet 0    Sig: TAKE 1 TABLET BY MOUTH DAILY     Cardiovascular: Beta Blockers 2 Failed - 10/02/2023  2:46 PM      Failed - Valid encounter within last 6 months    Recent Outpatient Visits           1 month ago Bilateral chronic knee pain   [place] [facility] Pickard, Priscille Heidelberg, MD   6 months ago Pure hypercholesterolemia   [place] [facility] Donita Brooks, MD   9 months ago Bilateral chronic knee pain   [place] [facility] Pickard, Priscille Heidelberg, MD   1 year ago Primary hypertension   [place] [facility] Pickard, Priscille Heidelberg, MD   1 year ago Pleural effusion, right   [place] [facility] Pickard, Priscille Heidelberg, MD              Passed - Cr in normal range and within 360 days    Creat  Date Value Ref Range Status  03/27/2023 1.05 0.50 - 1.05 mg/dL Final   Creatinine, Urine  Date Value Ref Range Status  09/03/2022 104 20 - 275 mg/dL Final         Passed - Last BP in normal range    BP Readings from Last 1 Encounters:  09/26/23 135/83         Passed - Last Heart Rate in normal range    Pulse Readings from Last 1 Encounters:  09/26/23 84

## 2023-10-06 ENCOUNTER — Other Ambulatory Visit: Payer: Self-pay | Admitting: *Deleted

## 2023-10-06 ENCOUNTER — Telehealth: Payer: Self-pay | Admitting: *Deleted

## 2023-10-06 DIAGNOSIS — R112 Nausea with vomiting, unspecified: Secondary | ICD-10-CM

## 2023-10-06 DIAGNOSIS — R634 Abnormal weight loss: Secondary | ICD-10-CM

## 2023-10-06 DIAGNOSIS — C50412 Malignant neoplasm of upper-outer quadrant of left female breast: Secondary | ICD-10-CM

## 2023-10-06 DIAGNOSIS — R109 Unspecified abdominal pain: Secondary | ICD-10-CM

## 2023-10-06 NOTE — Telephone Encounter (Signed)
 This RN spoke with pt and husband per request by Dr Al Pimple for Ct of abd and pelvis- with appt made for 10/08/2023.  Informed PA team with authorization obtained.  Pt then called pt and stated her co pay would be " at least a $1000 " and she would like to not do the scan at present.  She is scheduled for GI consult for further work up with appt this Thursday.  Pt is scheduled for a phone visit by dr Al Pimple on 4/3 as well.  This note will be forwarded to MD for review and any further recommendations.

## 2023-10-08 ENCOUNTER — Ambulatory Visit (HOSPITAL_COMMUNITY)

## 2023-10-08 NOTE — Progress Notes (Unsigned)
 Erika Harvey, Erika Harvey, has severe nausea, loss of appetite and weight loss. She thinks its anastrozole, but its not a common side effect and its not a new medicine for her. 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 " While awaiting appointment, I recommended they arrange a CT abdomen and pelvis to rule out obstruction.  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  History of Present Illness     ***   ROS:  Review of Systems   Past Medical History: Past Medical History:  Diagnosis Date   Allergy    Anxiety    Arthritis    Harvey cancer (HCC) 07/19/2021   Chronic low back pain    Fibroid 03/30/2015   Frequent UTI    Hypertension    Personal history of radiation therapy    Vaginal bleeding 03/30/2015     Past Surgical History: Past Surgical History:  Procedure Laterality Date   BACK SURGERY  05/16/2015   Harvey BIOPSY Left 07/19/2021   Harvey LUMPECTOMY Left 08/08/2021   Harvey LUMPECTOMY WITH RADIOACTIVE SEED AND SENTINEL LYMPH NODE BIOPSY Left 08/08/2021   Procedure: LEFT Harvey LUMPECTOMY WITH RADIOACTIVE SEED AND SENTINEL LYMPH NODE BIOPSY;  Surgeon: Abigail Miyamoto, Erika;  Location: [facility];  Service: General;  Laterality: Left;   KNEE SURGERY     left   KNEE SURGERY Left  07/08/2012   WISDOM TOOTH EXTRACTION       Family History: Family History  Problem Relation Age of Onset   Harvey cancer Mother    Heart disease Father 32   Diabetes Father    Colon cancer Neg Hx    Colon polyps Neg Hx     Social History: Social History   Socioeconomic History   Marital status: Married    Spouse name: Not on file   Number of children: Not on file   Years of education: Not on file   Highest education level: Not on file  Occupational History   Not on file  Tobacco Use   Smoking status: Never   Smokeless tobacco: Never  Vaping Use   Vaping status: Never Used  Substance and Sexual Activity   Alcohol use: Yes    Alcohol/week: 5.0 standard drinks of alcohol    Types: 5 Cans of beer per week   Drug use: No   Sexual activity: Yes    Birth control/protection: Pill  Other Topics Concern   Not on file  Social History Narrative   Not on file   Social Drivers of Health   Financial Resource Strain: Not on file  Food Insecurity: Not on file  Transportation Needs: Not on file  Physical Activity: Not on file  Stress: Not on file  Social Connections: Not on file    Allergies: Allergies  Allergen Reactions   Baclofen    Norvasc [Amlodipine Besylate] Swelling and Rash  Outpatient Meds: Current Outpatient Medications  Medication Sig Dispense Refill   anastrozole (ARIMIDEX) 1 MG tablet Take 1 tablet (1 mg total) by mouth daily. 90 tablet 2   atenolol (TENORMIN) 25 MG tablet TAKE 1 TABLET BY MOUTH DAILY 30 tablet 0   atorvastatin (LIPITOR) 20 MG tablet TAKE 1 TABLET BY MOUTH DAILY 30 tablet 1   calcium-vitamin D (OSCAL WITH D) 250-125 MG-UNIT per tablet Take 1 tablet by mouth daily.     cyclobenzaprine (FLEXERIL) 10 MG tablet TAKE ONE TABLET BY MOUTH THREE TIMES A DAY AS NEEDED FOR MUSCLE SPASMS 60 tablet 2   diazepam (VALIUM) 5 MG tablet TAKE 1 TABLET BY MOUTH 2 TIMES A DAY AS NEEDED FOR ANXIETY **NEEDS TO MAKE APPOINMENT FOR FUTURE REFILLS** 60 tablet  3   diclofenac Sodium (VOLTAREN) 1 % GEL Apply 2 g topically 4 (four) times daily. 100 g 5   DULoxetine (CYMBALTA) 60 MG capsule TAKE 1 CAPSULE BY MOUTH DAILY 90 capsule 0   fish oil-omega-3 fatty acids 1000 MG capsule Take 2 capsules (2 g total) by mouth 2 (two) times daily. 14 capsule 0   fluticasone (FLONASE) 50 MCG/ACT nasal spray Place 2 sprays into both nostrils daily. 16 g 6   losartan (COZAAR) 100 MG tablet TAKE 1 TABLET BY MOUTH DAILY 30 tablet 11   losartan (COZAAR) 100 MG tablet Take 1 tablet (100 mg total) by mouth daily. 90 tablet 1   omeprazole (PRILOSEC) 20 MG capsule Take 1 capsule (20 mg total) by mouth daily. 90 capsule 1   ondansetron (ZOFRAN-ODT) 4 MG disintegrating tablet Take 1 tablet (4 mg total) by mouth every 8 (eight) hours as needed. 20 tablet 0   polyethylene glycol (MIRALAX) 17 g packet Take 17 g twice daily until you have a large bowel movement, then begin taking one packet daily as needed for constipation. 30 each 0   senna-docusate (SENOKOT-S) 8.6-50 MG tablet Take 1 tablet by mouth at bedtime as needed for mild constipation. 30 tablet 0   traMADol (ULTRAM) 50 MG tablet TAKE 1 TABLET BY MOUTH EVERY 6 HOURS AS NEEDED 120 tablet 2   zolpidem (AMBIEN) 10 MG tablet TAKE 1 TABLET BY MOUTH EVERY NIGHT AT BEDTIME AS NEEDED FOR SLEEP 30 tablet 5   No current facility-administered medications for this visit.      ___________________________________________________________________ Objective   Exam:  LMP 03/23/2015  Wt Readings from Last 3 Encounters:  09/26/23 116 lb 14.4 oz (53 kg)  08/08/23 122 lb (55.3 kg)  03/27/23 124 lb (56.2 kg)    General: ***  Eyes: sclera anicteric, no redness ENT: oral mucosa moist without lesions, no cervical or supraclavicular lymphadenopathy CV: ***, no JVD, no peripheral edema Resp: clear to auscultation bilaterally, normal RR and effort noted GI: soft, *** tenderness, with active bowel sounds. No guarding or palpable  organomegaly noted. Skin; warm and dry, no rash or jaundice noted Neuro: awake, alert and oriented x 3. Normal gross motor function and fluent speech   Labs:  Last labs on file in this EHR from September 2024  Radiologic Studies:  ***   No diagnosis found.  Assessment and Plan Assessment & Plan      Plan:  ***  Thank you for the courtesy of this consult.  Please call me with any questions or concerns.  Charlie Pitter III  CC: Referring provider noted above

## 2023-10-09 ENCOUNTER — Ambulatory Visit: Admitting: Gastroenterology

## 2023-10-09 ENCOUNTER — Inpatient Hospital Stay: Attending: Hematology and Oncology | Admitting: Hematology and Oncology

## 2023-10-09 ENCOUNTER — Encounter: Payer: Self-pay | Admitting: Gastroenterology

## 2023-10-09 VITALS — BP 110/70 | HR 90 | Ht 63.0 in | Wt 117.0 lb

## 2023-10-09 DIAGNOSIS — C50412 Malignant neoplasm of upper-outer quadrant of left female breast: Secondary | ICD-10-CM

## 2023-10-09 DIAGNOSIS — R634 Abnormal weight loss: Secondary | ICD-10-CM | POA: Diagnosis not present

## 2023-10-09 DIAGNOSIS — R110 Nausea: Secondary | ICD-10-CM | POA: Diagnosis not present

## 2023-10-09 DIAGNOSIS — Z170 Estrogen receptor positive status [ER+]: Secondary | ICD-10-CM

## 2023-10-09 MED ORDER — ONDANSETRON 4 MG PO TBDP: 4.0000 mg | ORAL_TABLET | Freq: Three times a day (TID) | ORAL | 0 refills | Status: DC | PRN

## 2023-10-09 NOTE — Patient Instructions (Signed)
 We have sent the following medications to your pharmacy for you to pick up at your convenience: Zofran   _______________________________________________________  If your blood pressure at your visit was 140/90 or greater, please contact your primary care physician to follow up on this.  _______________________________________________________  If you are age 64 or older, your body mass index should be between 23-30. Your Body mass index is 20.73 kg/m. If this is out of the aforementioned range listed, please consider follow up with your Primary Care Provider.  If you are age 56 or younger, your body mass index should be between 19-25. Your Body mass index is 20.73 kg/m. If this is out of the aformentioned range listed, please consider follow up with your Primary Care Provider.   ________________________________________________________  The [facility] providers would like to encourage you to use [facility] to communicate with providers for non-urgent requests or questions.  Due to long hold times on the telephone, sending your provider a message by [facility] may be a faster and more efficient way to get a response.  Please allow 48 business hours for a response.  Please remember that this is for non-urgent requests.  _______________________________________________________  Thank you for choosing me and [facility].  Dr.Henry Myrtie Neither

## 2023-10-09 NOTE — Progress Notes (Signed)
 Called patient, no answer, Voicemail box is full.

## 2023-10-15 ENCOUNTER — Other Ambulatory Visit: Payer: Self-pay | Admitting: Family Medicine

## 2023-10-15 ENCOUNTER — Telehealth: Payer: Self-pay | Admitting: Hematology and Oncology

## 2023-10-15 DIAGNOSIS — I10 Essential (primary) hypertension: Secondary | ICD-10-CM

## 2023-10-15 NOTE — Telephone Encounter (Signed)
 Spoke with patient confirming upcoming appointment

## 2023-10-16 ENCOUNTER — Other Ambulatory Visit: Payer: Self-pay

## 2023-10-16 DIAGNOSIS — I10 Essential (primary) hypertension: Secondary | ICD-10-CM

## 2023-10-16 MED ORDER — ATENOLOL 25 MG PO TABS: 25.0000 mg | ORAL_TABLET | Freq: Every day | ORAL | 0 refills | Status: DC

## 2023-10-16 NOTE — Telephone Encounter (Signed)
 Requested medications are due for refill today.  yes  Requested medications are on the active medications list.  yes  Last refill. 08/25/2023 #30 0 rf  Future visit scheduled.   no  Notes to clinic.  Pt already given a courtesy refill.     Requested Prescriptions  Pending Prescriptions Disp Refills   atenolol (TENORMIN) 25 MG tablet [Pharmacy Med Name: ATENOLOL 25 MG TABLET] 30 tablet 0    Sig: TAKE 1 TABLET BY MOUTH DAILY     Cardiovascular: Beta Blockers 2 Failed - 10/16/2023  8:26 AM      Failed - Valid encounter within last 6 months    Recent Outpatient Visits           2 months ago Bilateral chronic knee pain   [place] [facility] Pickard, Priscille Heidelberg, MD   6 months ago Pure hypercholesterolemia   [place] [facility] Donita Brooks, MD   10 months ago Bilateral chronic knee pain   [place] [facility] Pickard, Priscille Heidelberg, MD   1 year ago Primary hypertension   [place] [facility] Tanya Nones, Priscille Heidelberg, MD   1 year ago Pleural effusion, right   [place] [facility] Pickard, Priscille Heidelberg, MD              Passed - Cr in normal range and within 360 days    Creat  Date Value Ref Range Status  03/27/2023 1.05 0.50 - 1.05 mg/dL Final   Creatinine, Urine  Date Value Ref Range Status  09/03/2022 104 20 - 275 mg/dL Final         Passed - Last BP in normal range    BP Readings from Last 1 Encounters:  10/09/23 110/70         Passed - Last Heart Rate in normal range    Pulse Readings from Last 1 Encounters:  10/09/23 90

## 2023-10-17 ENCOUNTER — Inpatient Hospital Stay (HOSPITAL_BASED_OUTPATIENT_CLINIC_OR_DEPARTMENT_OTHER): Admitting: Hematology and Oncology

## 2023-10-17 DIAGNOSIS — C50412 Malignant neoplasm of upper-outer quadrant of left female breast: Secondary | ICD-10-CM

## 2023-10-17 DIAGNOSIS — Z170 Estrogen receptor positive status [ER+]: Secondary | ICD-10-CM

## 2023-10-17 MED ORDER — TAMOXIFEN CITRATE 20 MG PO TABS: 20.0000 mg | ORAL_TABLET | Freq: Every day | ORAL | 3 refills | Status: DC

## 2023-10-17 NOTE — Progress Notes (Signed)
 [facility] Cancer Follow up:    Erika Brooks, MD [address] [place] [address]   DIAGNOSIS:  Cancer Staging  Malignant neoplasm of upper-outer quadrant of left breast in female, estrogen receptor positive (HCC) Staging form: Breast, AJCC 8th Edition - Clinical stage from 07/19/2021: Stage IA (cT1b, cN0, cM0, G2, ER+, PR+, HER2-) - Signed by Loa Socks, NP on 07/25/2021 Stage prefix: Initial diagnosis Histologic grading system: 3 grade system - Pathologic stage from 08/08/2021: Stage IA (pT1b, pN0, cM0, G3, ER+, PR+, HER2-, Oncotype DX score: 28) - Signed by Loa Socks, NP on 07/02/2022 Stage prefix: Initial diagnosis Multigene prognostic tests performed: Oncotype DX Recurrence score range: Greater than or equal to 11 Histologic grading system: 3 grade system   SUMMARY OF ONCOLOGIC HISTORY: Oncology History  Malignant neoplasm of upper-outer quadrant of left breast in female, estrogen receptor positive (HCC)  07/19/2021 Initial Diagnosis   Left breast biopsy at 2:30 o'clock: ILC, grade 2, ER 100%, PR 40%, Ki-67 10%, HER2 negative   07/19/2021 Cancer Staging   Staging form: Breast, AJCC 8th Edition - Clinical stage from 07/19/2021: Stage IA (cT1b, cN0, cM0, G2, ER+, PR+, HER2-) - Signed by Loa Socks, NP on 07/25/2021 Stage prefix: Initial diagnosis Histologic grading system: 3 grade system   08/08/2021 Surgery   Left breast lumpectomy: ILC 0.7cm, g 3, margins negative, 4 SLN negative   08/08/2021 Oncotype testing   Oncotype score results are as 28, distant recurrence risk at 9 years with tamoxifen alone is 17% and chemotherapy benefit was deemed greater than 15%. Risks and benefits of adjuvant chemotherapy reviewed and patient opted to forego treatment.    08/08/2021 Cancer Staging   Staging form: Breast, AJCC 8th Edition - Pathologic stage from 08/08/2021: Stage IA (pT1b, pN0, cM0, G3, ER+, PR+, HER2-, Oncotype DX  score: 28) - Signed by Loa Socks, NP on 07/02/2022 Stage prefix: Initial diagnosis Multigene prognostic tests performed: Oncotype DX Recurrence score range: Greater than or equal to 11 Histologic grading system: 3 grade system   09/13/2021 - 10/11/2021 Radiation Therapy   Site Technique Total Dose (Gy) Dose per Fx (Gy) Completed Fx Beam Energies  Breast, Left: Breast_L 3D 42.56/42.56 2.66 16/16 6XFFF  Breast, Left: Breast_L_Bst 3D 8/8 2 4/4 6X     11/2021 -  Anti-estrogen oral therapy   Anastrozole daily     CURRENT THERAPY: Tamoxifen  INTERVAL HISTORY  Erika Harvey is a 63 year old female with breast cancer who is here for a telephone follow up. Pt and her husband say since she stopped anastrozole, she felt better, nausea resolved, eating better and gained 5 lbs of weight. Since she has improved, GI didn't recommend any further investigation which is reasonable. She is interested in trying an alternative medication.  Patient Active Problem List   Diagnosis Date Noted   Malignant neoplasm of upper-outer quadrant of left breast in female, estrogen receptor positive (HCC) 07/25/2021   Hypoglycemia 09/15/2018   Insomnia 09/07/2015   Osteopenia 08/09/2015   Vaginal bleeding 03/30/2015   Fibroid 03/30/2015   GERD (gastroesophageal reflux disease) 11/10/2014   Generalized OA 03/29/2014   Lumbar herniated disc 02/09/2014    Class: Chronic   Chronic pain syndrome 02/09/2014   DDD (degenerative disc disease), lumbar 02/09/2014   Spondylolysis 02/09/2014   Lumbosacral radiculitis 02/09/2014   Chronic low back pain    Hypertension    Anxiety    Frequent UTI  is allergic to baclofen and norvasc [amlodipine besylate].  MEDICAL HISTORY: Past Medical History:  Diagnosis Date   Allergy    Anxiety    Arthritis    Breast cancer (HCC) 07/19/2021   Chronic low back pain    Fibroid 03/30/2015   Frequent UTI    Hypertension    Personal history of radiation  therapy    Vaginal bleeding 03/30/2015    SURGICAL HISTORY: Past Surgical History:  Procedure Laterality Date   BACK SURGERY  05/16/2015   BREAST BIOPSY Left 07/19/2021   BREAST LUMPECTOMY Left 08/08/2021   BREAST LUMPECTOMY WITH RADIOACTIVE SEED AND SENTINEL LYMPH NODE BIOPSY Left 08/08/2021   Procedure: LEFT BREAST LUMPECTOMY WITH RADIOACTIVE SEED AND SENTINEL LYMPH NODE BIOPSY;  Surgeon: Abigail Miyamoto, MD;  Location: [facility];  Service: General;  Laterality: Left;   KNEE SURGERY     left   KNEE SURGERY Left 07/08/2012   WISDOM TOOTH EXTRACTION      SOCIAL HISTORY: Social History   Socioeconomic History   Marital status: Married    Spouse name: Not on file   Number of children: Not on file   Years of education: Not on file   Highest education level: Not on file  Occupational History   Not on file  Tobacco Use   Smoking status: Never   Smokeless tobacco: Never  Vaping Use   Vaping status: Never Used  Substance and Sexual Activity   Alcohol use: Yes    Alcohol/week: 5.0 standard drinks of alcohol    Types: 5 Cans of beer per week   Drug use: No   Sexual activity: Yes    Birth control/protection: Pill  Other Topics Concern   Not on file  Social History Narrative   Not on file   Social Drivers of Health   Financial Resource Strain: Not on file  Food Insecurity: Not on file  Transportation Needs: Not on file  Physical Activity: Not on file  Stress: Not on file  Social Connections: Not on file  Intimate Partner Violence: Not on file    FAMILY HISTORY: Family History  Problem Relation Age of Onset   Breast cancer Mother    Heart disease Father 52   Diabetes Father    Colon cancer Neg Hx    Colon polyps Neg Hx     Review of Systems  Constitutional:  Negative for appetite change, chills, fatigue, fever and unexpected weight change.  HENT:   Negative for hearing loss, lump/mass and trouble swallowing.   Eyes:  Negative for eye  problems and icterus.  Respiratory:  Negative for chest tightness, cough and shortness of breath.   Cardiovascular:  Negative for chest pain, leg swelling and palpitations.  Gastrointestinal:  Negative for abdominal distention, abdominal pain, constipation, diarrhea, nausea and vomiting.  Endocrine: Positive for hot flashes.  Genitourinary:  Negative for difficulty urinating.   Musculoskeletal:  Negative for arthralgias.  Skin:  Negative for itching and rash.  Neurological:  Negative for dizziness, extremity weakness, headaches and numbness.  Hematological:  Negative for adenopathy. Does not bruise/bleed easily.  Psychiatric/Behavioral:  Negative for depression. The patient is not nervous/anxious.       PHYSICAL EXAMINATION  ECOG PERFORMANCE STATUS: 1 - Symptomatic but completely ambulatory  There were no vitals filed for this visit.  Telephone visit.  LABORATORY DATA:  None for this visit      ASSESSMENT and THERAPY PLAN:   Malignant neoplasm of upper-outer quadrant of left breast in  female, estrogen receptor positive (HCC) Erika Harvey is a 63 year old woman with history of stage Ia ER/PR positive breast cancer that was diagnosed in January 2023 status post lumpectomy, adjuvant radiation, and antiestrogen therapy with anastrozole daily which began in May 2023.  During her most recent visit, pt complained of intense nausea, unintentional weight loss. Given lobular histology and GI symptoms, we discussed about GI referral.  We also discussed about stopping anastrozole and see if her symptoms improve. Apparently her symptoms improved tremendously after she quit anastrozole and she is feeling much better. She has gained 5 lbs of weight. GI eval deferred given spontaneous improvement We have today discussed about tamoxifen as an alternative. With regards to Tamoxifen, we discussed that this is a SERM, selective estrogen receptor modulator. We discussed mechanism of action of Tamoxifen,  adverse effects on Tamoxifen including but not limited to post menopausal symptoms, increased risk of DVT/PE, increased risk of endometrial cancer, questionable cataracts with long term use and increased risk of cardiovascular events in the study which was not statistically significant. A benefit from Tamoxifen would be improvement in bone density. We have discussed interaction between tamoxifen and cymbalta which can reduce effective ness of tamoxifen. We discussed that it is reasonable to try and see if she tolerates it well.  She is agreeable, prescription dispensed to her pharmacy of choice.   I connected with  Erika Harvey on 10/17/23 by a telephone application and verified that I am speaking with the correct person using two identifiers.   I discussed the limitations of evaluation and management by telemedicine. The patient expressed understanding and agreed to proceed.  Location of provider: Office Location of patient: Home.   All questions were answered. The patient knows to call the clinic with any problems, questions or concerns. We can certainly see the patient much sooner if necessary.  Total encounter time:20 minutes*in face-to-face visit time, chart review, lab review, care coordination, order entry, and documentation of the encounter time.   *Total Encounter Time as defined by the Centers for Medicare and Medicaid Services includes, in addition to the face-to-face time of a patient visit (documented in the note above) non-face-to-face time: obtaining and reviewing outside history, ordering and reviewing medications, tests or procedures, care coordination (communications with other health care professionals or caregivers) and documentation in the medical record.

## 2023-10-17 NOTE — Assessment & Plan Note (Signed)
 Erika Harvey is a 63 year old woman with history of stage Ia ER/PR positive breast cancer that was diagnosed in January 2023 status post lumpectomy, adjuvant radiation, and antiestrogen therapy with anastrozole daily which began in May 2023.  During her most recent visit, pt complained of intense nausea, unintentional weight loss. Given lobular histology and GI symptoms, we discussed about GI referral.  We also discussed about stopping anastrozole and see if her symptoms improve. Apparently her symptoms improved tremendously after she quit anastrozole and she is feeling much better. She has gained 5 lbs of weight. GI eval deferred given spontaneous improvement We have today discussed about tamoxifen as an alternative. With regards to Tamoxifen, we discussed that this is a SERM, selective estrogen receptor modulator. We discussed mechanism of action of Tamoxifen, adverse effects on Tamoxifen including but not limited to post menopausal symptoms, increased risk of DVT/PE, increased risk of endometrial cancer, questionable cataracts with long term use and increased risk of cardiovascular events in the study which was not statistically significant. A benefit from Tamoxifen would be improvement in bone density. We have discussed interaction between tamoxifen and cymbalta which can reduce effective ness of tamoxifen. We discussed that it is reasonable to try and see if she tolerates it well.  She is agreeable, prescription dispensed to her pharmacy of choice.

## 2023-10-20 ENCOUNTER — Other Ambulatory Visit: Payer: Self-pay | Admitting: Family Medicine

## 2023-10-20 DIAGNOSIS — K219 Gastro-esophageal reflux disease without esophagitis: Secondary | ICD-10-CM

## 2023-10-21 NOTE — Telephone Encounter (Signed)
 Requested by interface surescripts.  Requested Prescriptions  Pending Prescriptions Disp Refills   omeprazole (PRILOSEC) 20 MG capsule [Pharmacy Med Name: OMEPRAZOLE DR 20 MG CAPSULE] 90 capsule 1    Sig: TAKE 1 CAPSULE BY MOUTH DAILY     Gastroenterology: Proton Pump Inhibitors Passed - 10/21/2023 11:42 AM      Passed - Valid encounter within last 12 months    Recent Outpatient Visits           2 months ago Bilateral chronic knee pain   [place] [facility] Pickard, Cisco Crest, MD   6 months ago Pure hypercholesterolemia   [place] [facility] Austine Lefort, MD   10 months ago Bilateral chronic knee pain   [place] [facility] Cheril Cork, Cisco Crest, MD   1 year ago Primary hypertension   [place] [facility] Cheril Cork, Cisco Crest, MD   1 year ago Pleural effusion, right    [facility] Pickard, Cisco Crest, MD

## 2023-10-23 ENCOUNTER — Telehealth: Payer: Self-pay

## 2023-10-23 NOTE — Telephone Encounter (Signed)
 [facility] (KeyFlorinda Hush) Rx #: [id number] Need Help? Call us  at [phone] Outcome Additional Information Required Your PA has been resolved, no additional PA is required. For further inquiries please contact the number on the back of the member prescription card. (Message [phone]) Drug Omeprazole 20MG  dr capsules ePA cloud Psychologist, educational Electronic PA Form [phone] NCPDP) Original Claim Info [address] LIMIT EXCD PA REQ`D CALL [phone] MAX QTY OF 90.000 IN 365 DAYSQUANTITY REMAINING 60.000NEXT FILL DT [id number], LAST FILL [id number] @HARRIS  TEETER P,PH# [phone](VOZDGUYQ HELP DESK [phone]

## 2023-10-30 ENCOUNTER — Telehealth: Payer: Self-pay

## 2023-10-30 ENCOUNTER — Ambulatory Visit: Admitting: Family Medicine

## 2023-10-30 NOTE — Telephone Encounter (Signed)
 Pt called and reports that since starting Tamoxifen  she is experiencing GI upset again. She reports nausea without vomiting. She states it is difficult for her to eat and she has lost a significant amount of weight, doesn't wish to lose more. She reports taking her prescribed antiemetics but states she does not want to have to take this every day in order to take Tamoxifen . Advised pt I would discuss with MD and return her call with MD recommendation.

## 2023-11-03 ENCOUNTER — Other Ambulatory Visit: Payer: Self-pay | Admitting: Family Medicine

## 2023-11-05 ENCOUNTER — Telehealth: Payer: Self-pay | Admitting: *Deleted

## 2023-11-05 DIAGNOSIS — R110 Nausea: Secondary | ICD-10-CM

## 2023-11-05 MED ORDER — ONDANSETRON 4 MG PO TBDP: 4.0000 mg | ORAL_TABLET | Freq: Three times a day (TID) | ORAL | 0 refills | Status: DC | PRN

## 2023-11-05 NOTE — Telephone Encounter (Signed)
 Call was forwarded to oncoming nurse, but no note was seen for f/u. I called pt to discuss per MD. Pt did not answer and her VM is full.

## 2023-11-05 NOTE — Telephone Encounter (Signed)
 This RN spoke with pt per side effects from tamoxifen  - discussed possible new medication can be called in but also may cause same issues as the anastrozole  - per further discussion and inquiry-  pt states she takes the tamoxifen  in the am - then develops the nausea.  Note she had a prior prescription for ondansetron  which gave relief but is now out of it.  This RN discussed possible benefit in taking the medication at night - with starting the dose at 1/2 every other night for tolerance.  Refill for the ondansetron  will be sent in and she is to call this office next week with update in symptoms for further recommendations.  Pt verbalized appreciation of discussion and plan.

## 2023-11-06 ENCOUNTER — Other Ambulatory Visit: Payer: Self-pay | Admitting: Family Medicine

## 2023-11-06 DIAGNOSIS — F419 Anxiety disorder, unspecified: Secondary | ICD-10-CM

## 2023-11-06 NOTE — Telephone Encounter (Unsigned)
 Copied from CRM [id number]. Topic: Clinical - Medication Refill >> Nov 06, 2023 10:52 AM Tiffany B wrote: Most Recent Primary Care Visit:  Provider: Eliane Grooms T  Department: [facility]  Visit Type: SAME DAY  Date: 08/08/2023  Medication: zolpidem  (AMBIEN ) 10 MG tablet and diazepam  (VALIUM ) 5 MG tablet  Has the patient contacted their pharmacy? Yes  (Agent: If yes, when and what did the pharmacy advise?) Contact PCP   Is this the correct pharmacy for this prescription? [id number] [place], [place] - [address] [place] [address] Phone: [phone] Fax: [phone]   Has the prescription been filled recently? Yes  Is the patient out of the medication? No  Has the patient been seen for an appointment in the last year OR does the patient have an upcoming appointment? Yes  Can we respond through MyChart? No  Agent: Please be advised that Rx refills may take up to 3 business days. We ask that you follow-up with your pharmacy.

## 2023-11-07 ENCOUNTER — Other Ambulatory Visit: Payer: Self-pay | Admitting: Family Medicine

## 2023-11-07 DIAGNOSIS — I10 Essential (primary) hypertension: Secondary | ICD-10-CM

## 2023-11-10 ENCOUNTER — Other Ambulatory Visit: Payer: Self-pay | Admitting: Family Medicine

## 2023-11-10 ENCOUNTER — Telehealth: Payer: Self-pay

## 2023-11-10 DIAGNOSIS — F419 Anxiety disorder, unspecified: Secondary | ICD-10-CM

## 2023-11-10 MED ORDER — ZOLPIDEM TARTRATE 10 MG PO TABS: 10.0000 mg | ORAL_TABLET | Freq: Every evening | ORAL | 3 refills | Status: DC | PRN

## 2023-11-10 MED ORDER — DIAZEPAM 5 MG PO TABS: ORAL_TABLET | ORAL | 0 refills | Status: DC

## 2023-11-10 NOTE — Telephone Encounter (Signed)
 Requested medication (s) are due for refill today:   Provider to review both  Requested medication (s) are on the active medication list:   Yes for both  Future visit scheduled:   No.    LOV 08/08/2023   Last ordered: Ambien  05/08/2023 #30, 5 refills;   Valium  07/08/2023 #60, 3 refills  Unable to refill because they are both non delegated refills.    Requested Prescriptions  Pending Prescriptions Disp Refills   zolpidem  (AMBIEN ) 10 MG tablet [Pharmacy Med Name: ZOLPIDEM  TARTRATE 10 MG TABLET] 30 tablet     Sig: TAKE 1 TABLET BY MOUTH EVERY NIGHT AT BEDTIME AS NEEDED FOR SLEEP     Not Delegated - Psychiatry:  Anxiolytics/Hypnotics Failed - 11/10/2023  8:00 AM      Failed - This refill cannot be delegated      Failed - Urine Drug Screen completed in last 360 days      Failed - Valid encounter within last 6 months    Recent Outpatient Visits           3 months ago Bilateral chronic knee pain   [place] [facility] Pickard, Cisco Crest, MD   7 months ago Pure hypercholesterolemia   [place] [facility] Cheril Cork, Cisco Crest, MD   10 months ago Bilateral chronic knee pain   [place] [facility] Pickard, Cisco Crest, MD   1 year ago Primary hypertension   [place] [facility] Cheril Cork, Cisco Crest, MD   1 year ago Pleural effusion, right   [place] [facility] Pickard, Cisco Crest, MD               diazepam  (VALIUM ) 5 MG tablet [Pharmacy Med Name: diazePAM  5 MG TABLET] 60 tablet     Sig: TAKE 1 TABLET BY MOUTH 2 TIMES A DAY AS NEEDED FOR ANXIETY *NEEDS TO MAKE APPOINTMENT FOR FUTURE REFILLS*     Not Delegated - Psychiatry: Anxiolytics/Hypnotics 2 Failed - 11/10/2023  8:00 AM      Failed - This refill cannot be delegated      Failed - Urine Drug Screen completed in last 360 days      Failed - Valid encounter within last 6 months    Recent Outpatient Visits           3 months ago Bilateral  chronic knee pain   [place] [facility] Austine Lefort, MD   7 months ago Pure hypercholesterolemia   [place] [facility] Austine Lefort, MD   10 months ago Bilateral chronic knee pain   [place] [facility] Cheril Cork, Cisco Crest, MD   1 year ago Primary hypertension   [place] [facility] Cheril Cork, Cisco Crest, MD   1 year ago Pleural effusion, right    [facility] Pickard, Cisco Crest, MD              Passed - Patient is not pregnant

## 2023-11-10 NOTE — Telephone Encounter (Signed)
 Requested medication (s) are due for refill today: Yes  Requested medication (s) are on the active medication list: Yes  Last refill:  Ambien  05/07/24   Valium   07/07/24  Future visit scheduled: No  Notes to clinic:  Not delegated.    Requested Prescriptions  Pending Prescriptions Disp Refills   zolpidem  (AMBIEN ) 10 MG tablet [Pharmacy Med Name: ZOLPIDEM  TARTRATE 10 MG TABLET] 30 tablet     Sig: TAKE 1 TABLET BY MOUTH EVERY NIGHT AT BEDTIME AS NEEDED FOR SLEEP     Not Delegated - Psychiatry:  Anxiolytics/Hypnotics Failed - 11/10/2023  8:04 AM      Failed - This refill cannot be delegated      Failed - Urine Drug Screen completed in last 360 days      Failed - Valid encounter within last 6 months    Recent Outpatient Visits           3 months ago Bilateral chronic knee pain   [place] [facility] Pickard, Cisco Crest, MD   7 months ago Pure hypercholesterolemia   [place] [facility] Cheril Cork, Cisco Crest, MD   10 months ago Bilateral chronic knee pain   [place] [facility] Pickard, Cisco Crest, MD   1 year ago Primary hypertension   [place] [facility] Cheril Cork, Cisco Crest, MD   1 year ago Pleural effusion, right   [place] [facility] Pickard, Cisco Crest, MD               diazepam  (VALIUM ) 5 MG tablet [Pharmacy Med Name: diazePAM  5 MG TABLET] 60 tablet     Sig: TAKE 1 TABLET BY MOUTH 2 TIMES A DAY AS NEEDED FOR ANXIETY *NEEDS TO MAKE APPOINTMENT FOR FUTURE REFILLS*     Not Delegated - Psychiatry: Anxiolytics/Hypnotics 2 Failed - 11/10/2023  8:04 AM      Failed - This refill cannot be delegated      Failed - Urine Drug Screen completed in last 360 days      Failed - Valid encounter within last 6 months    Recent Outpatient Visits           3 months ago Bilateral chronic knee pain   [place] [facility] Austine Lefort, MD   7 months ago Pure  hypercholesterolemia   [place] [facility] Austine Lefort, MD   10 months ago Bilateral chronic knee pain   [place] [facility] Cheril Cork, Cisco Crest, MD   1 year ago Primary hypertension   [place] [facility] Cheril Cork, Cisco Crest, MD   1 year ago Pleural effusion, right   [place] [facility] Pickard, Cisco Crest, MD              Passed - Patient is not pregnant

## 2023-11-10 NOTE — Telephone Encounter (Signed)
 Requested medication (s) are due for refill today: yeses  Requested medication (s) are on the active medication list: yes  Last refill:  10/16/23  Future visit scheduled: no  Notes to clinic:  Unable to refill per protocol, courtesy refill already given, routing for provider approval.      Requested Prescriptions  Pending Prescriptions Disp Refills   atenolol  (TENORMIN ) 25 MG tablet [Pharmacy Med Name: ATENOLOL  25 MG TABLET] 30 tablet 0    Sig: TAKE 1 TABLET BY MOUTH DAILY     Cardiovascular: Beta Blockers 2 Failed - 11/10/2023  3:38 PM      Failed - Valid encounter within last 6 months    Recent Outpatient Visits           3 months ago Bilateral chronic knee pain   [facility] Pickard, Cisco Crest, MD   7 months ago Pure hypercholesterolemia   [place] [facility] Austine Lefort, MD   10 months ago Bilateral chronic knee pain   [place] [facility] Pickard, Cisco Crest, MD   1 year ago Primary hypertension   [place] [facility] Cheril Cork, Cisco Crest, MD   1 year ago Pleural effusion, right   [place] [facility] Pickard, Cisco Crest, MD              Passed - Cr in normal range and within 360 days    Creat  Date Value Ref Range Status  03/27/2023 1.05 0.50 - 1.05 mg/dL Final   Creatinine, Urine  Date Value Ref Range Status  09/03/2022 104 20 - 275 mg/dL Final         Passed - Last BP in normal range    BP Readings from Last 1 Encounters:  10/09/23 110/70         Passed - Last Heart Rate in normal range    Pulse Readings from Last 1 Encounters:  10/09/23 90

## 2023-12-06 ENCOUNTER — Other Ambulatory Visit: Payer: Self-pay | Admitting: Family Medicine

## 2023-12-08 ENCOUNTER — Other Ambulatory Visit: Payer: Self-pay | Admitting: Family Medicine

## 2023-12-08 DIAGNOSIS — R110 Nausea: Secondary | ICD-10-CM

## 2023-12-08 NOTE — Telephone Encounter (Signed)
 Copied from CRM [id number]. Topic: Clinical - Medication Refill >> Dec 08, 2023  9:00 AM Freya Jesus wrote: Medication: ondansetron  (ZOFRAN -ODT) 4 MG disintegrating tablet [[id number]]  Has the patient contacted their pharmacy? Yes (Agent: If no, request that the patient contact the pharmacy for the refill. If patient does not wish to contact the pharmacy document the reason why and proceed with request.) (Agent: If yes, when and what did the pharmacy advise?)  This is the patient's preferred pharmacy:  [facility] [id number] - [place], [place] - [address] [place] [address] Phone: [phone] Fax: [phone]  Is this the correct pharmacy for this prescription? Yes If no, delete pharmacy and type the correct one.   Has the prescription been filled recently? No  Is the patient out of the medication? Yes  Has the patient been seen for an appointment in the last year OR does the patient have an upcoming appointment? Yes  Can we respond through MyChart? Yes  Agent: Please be advised that Rx refills may take up to 3 business days. We ask that you follow-up with your pharmacy.

## 2023-12-09 NOTE — Telephone Encounter (Signed)
 Requested medication (s) are due for refill today:   Provider to review  Requested medication (s) are on the active medication list:   Yes  Future visit scheduled:   No.     LOV 08/08/2023.    Appt on 10/30/2023 cancelled by pt.   Last ordered: 11/05/2023 #20, 0 refills  Non delegated refill    Requested Prescriptions  Pending Prescriptions Disp Refills   ondansetron  (ZOFRAN -ODT) 4 MG disintegrating tablet 20 tablet 0    Sig: Take 1 tablet (4 mg total) by mouth every 8 (eight) hours as needed.     Not Delegated - Gastroenterology: Antiemetics - ondansetron  Failed - 12/09/2023 11:23 AM      Failed - This refill cannot be delegated      Failed - Valid encounter within last 6 months    Recent Outpatient Visits           4 months ago Bilateral chronic knee pain   [facility] Pickard, Cisco Crest, MD   8 months ago Pure hypercholesterolemia   [place] [facility] Austine Lefort, MD   11 months ago Bilateral chronic knee pain   [place] [facility] Cheril Cork, Cisco Crest, MD   1 year ago Primary hypertension   [place] [facility] Cheril Cork, Cisco Crest, MD   1 year ago Pleural effusion, right    [facility] Pickard, Cisco Crest, MD              Passed - AST in normal range and within 360 days    AST  Date Value Ref Range Status  03/27/2023 14 10 - 35 U/L Final         Passed - ALT in normal range and within 360 days    ALT  Date Value Ref Range Status  03/27/2023 14 6 - 29 U/L Final

## 2023-12-11 ENCOUNTER — Other Ambulatory Visit: Payer: Self-pay | Admitting: Family Medicine

## 2023-12-11 DIAGNOSIS — F419 Anxiety disorder, unspecified: Secondary | ICD-10-CM

## 2023-12-18 ENCOUNTER — Inpatient Hospital Stay: Attending: Hematology and Oncology | Admitting: Hematology and Oncology

## 2023-12-18 VITALS — BP 105/57 | HR 72 | Temp 98.2°F | Resp 17 | Wt 115.2 lb

## 2023-12-18 DIAGNOSIS — Z1732 Human epidermal growth factor receptor 2 negative status: Secondary | ICD-10-CM | POA: Insufficient documentation

## 2023-12-18 DIAGNOSIS — Z1721 Progesterone receptor positive status: Secondary | ICD-10-CM | POA: Diagnosis not present

## 2023-12-18 DIAGNOSIS — Z923 Personal history of irradiation: Secondary | ICD-10-CM | POA: Diagnosis not present

## 2023-12-18 DIAGNOSIS — Z79810 Long term (current) use of selective estrogen receptor modulators (SERMs): Secondary | ICD-10-CM | POA: Insufficient documentation

## 2023-12-18 DIAGNOSIS — C50412 Malignant neoplasm of upper-outer quadrant of left female breast: Secondary | ICD-10-CM | POA: Diagnosis not present

## 2023-12-18 DIAGNOSIS — M8580 Other specified disorders of bone density and structure, unspecified site: Secondary | ICD-10-CM | POA: Diagnosis not present

## 2023-12-18 DIAGNOSIS — Z170 Estrogen receptor positive status [ER+]: Secondary | ICD-10-CM | POA: Insufficient documentation

## 2023-12-18 NOTE — Assessment & Plan Note (Addendum)
 Erika Harvey is a 63 year old woman with history of stage Ia ER/PR positive breast cancer that was diagnosed in January 2023 status post lumpectomy, adjuvant radiation, and antiestrogen therapy with anastrozole  daily which began in May 2023, she took it for almost 2 yrs then suddenly started complaining of weight loss, loss of appetite and came to see us  saying she didn't want to keep taking it. We then switched to tamoxifen  which once again she didn't tolerate and she is here for follow up.  Since her last visit here, she continues to have same issues.  Has been tells me that within days after stopping these medications, she feels so much better, her skin bruises get better, her appetite gets better and she feels like she is doing well again.  After much discussion and discussing the importance of antiestrogen therapy especially for lobular histology to reduce the risk of recurrence systemically, they decided to proceed with active surveillance especially since her quality of life has been severely impacted.  We have agreed to consider guardant reveal MRD testing every 6 months as well as annual diagnostic mammogram and follow-up in 6 months.  I strongly encouraged her to follow-up with gastroenterology if the symptoms persist despite discontinuation of the medication and she expressed understanding.

## 2023-12-18 NOTE — Progress Notes (Signed)
 [facility] Cancer Follow up:    Erika Lefort, MD [address] [place] [address]   DIAGNOSIS:  Cancer Staging  Malignant neoplasm of upper-outer quadrant of left breast in female, estrogen receptor positive (HCC) Staging form: Breast, AJCC 8th Edition - Clinical stage from 07/19/2021: Stage IA (cT1b, cN0, cM0, G2, ER+, PR+, HER2-) - Signed by Percival Brace, NP on 07/25/2021 Stage prefix: Initial diagnosis Histologic grading system: 3 grade system - Pathologic stage from 08/08/2021: Stage IA (pT1b, pN0, cM0, G3, ER+, PR+, HER2-, Oncotype DX score: 28) - Signed by Percival Brace, NP on 07/02/2022 Stage prefix: Initial diagnosis Multigene prognostic tests performed: Oncotype DX Recurrence score range: Greater than or equal to 11 Histologic grading system: 3 grade system   SUMMARY OF ONCOLOGIC HISTORY: Oncology History  Malignant neoplasm of upper-outer quadrant of left breast in female, estrogen receptor positive (HCC)  07/19/2021 Initial Diagnosis   Left breast biopsy at 2:30 o'clock: ILC, grade 2, ER 100%, PR 40%, Ki-67 10%, HER2 negative   07/19/2021 Cancer Staging   Staging form: Breast, AJCC 8th Edition - Clinical stage from 07/19/2021: Stage IA (cT1b, cN0, cM0, G2, ER+, PR+, HER2-) - Signed by Percival Brace, NP on 07/25/2021 Stage prefix: Initial diagnosis Histologic grading system: 3 grade system   08/08/2021 Surgery   Left breast lumpectomy: ILC 0.7cm, g 3, margins negative, 4 SLN negative   08/08/2021 Oncotype testing   Oncotype score results are as 28, distant recurrence risk at 9 years with tamoxifen  alone is 17% and chemotherapy benefit was deemed greater than 15%. Risks and benefits of adjuvant chemotherapy reviewed and patient opted to forego treatment.    08/08/2021 Cancer Staging   Staging form: Breast, AJCC 8th Edition - Pathologic stage from 08/08/2021: Stage IA (pT1b, pN0, cM0, G3, ER+, PR+, HER2-, Oncotype DX  score: 28) - Signed by Percival Brace, NP on 07/02/2022 Stage prefix: Initial diagnosis Multigene prognostic tests performed: Oncotype DX Recurrence score range: Greater than or equal to 11 Histologic grading system: 3 grade system   09/13/2021 - 10/11/2021 Radiation Therapy   Site Technique Total Dose (Gy) Dose per Fx (Gy) Completed Fx Beam Energies  Breast, Left: Breast_L 3D 42.56/42.56 2.66 16/16 6XFFF  Breast, Left: Breast_L_Bst 3D 8/8 2 4/4 6X     11/2021 -  Anti-estrogen oral therapy   Anastrozole  daily     CURRENT THERAPY: Tamoxifen   INTERVAL HISTORY  Erika Harvey is a 63 year old female with breast cancer who is here for follow up.  She had very severe nausea and vomiting while on anastrozole , hence switched to tamoxifen .  She tells me that the same thing happened even on tamoxifen , she felt ill, had no appetite and lost 2 more pounds.  She does not want to live like this, she wants to have good quality of life.  She understands the importance of antiestrogen therapy but she would like to be just under active surveillance at this point.  Patient Active Problem List   Diagnosis Date Noted   Malignant neoplasm of upper-outer quadrant of left breast in female, estrogen receptor positive (HCC) 07/25/2021   Hypoglycemia 09/15/2018   Insomnia 09/07/2015   Osteopenia 08/09/2015   Vaginal bleeding 03/30/2015   Fibroid 03/30/2015   GERD (gastroesophageal reflux disease) 11/10/2014   Generalized OA 03/29/2014   Lumbar herniated disc 02/09/2014    Class: Chronic   Chronic pain syndrome 02/09/2014   DDD (degenerative disc disease), lumbar  02/09/2014   Spondylolysis 02/09/2014   Lumbosacral radiculitis 02/09/2014   Chronic low back pain    Hypertension    Anxiety    Frequent UTI     is allergic to baclofen and norvasc [amlodipine besylate].  MEDICAL HISTORY: Past Medical History:  Diagnosis Date   Allergy    Anxiety    Arthritis    Breast cancer (HCC)  07/19/2021   Chronic low back pain    Fibroid 03/30/2015   Frequent UTI    Hypertension    Personal history of radiation therapy    Vaginal bleeding 03/30/2015    SURGICAL HISTORY: Past Surgical History:  Procedure Laterality Date   BACK SURGERY  05/16/2015   BREAST BIOPSY Left 07/19/2021   BREAST LUMPECTOMY Left 08/08/2021   BREAST LUMPECTOMY WITH RADIOACTIVE SEED AND SENTINEL LYMPH NODE BIOPSY Left 08/08/2021   Procedure: LEFT BREAST LUMPECTOMY WITH RADIOACTIVE SEED AND SENTINEL LYMPH NODE BIOPSY;  Surgeon: Oza Blumenthal, MD;  Location: [facility];  Service: General;  Laterality: Left;   KNEE SURGERY     left   KNEE SURGERY Left 07/08/2012   WISDOM TOOTH EXTRACTION      SOCIAL HISTORY: Social History   Socioeconomic History   Marital status: Married    Spouse name: Not on file   Number of children: Not on file   Years of education: Not on file   Highest education level: Not on file  Occupational History   Not on file  Tobacco Use   Smoking status: Never   Smokeless tobacco: Never  Vaping Use   Vaping status: Never Used  Substance and Sexual Activity   Alcohol use: Yes    Alcohol/week: 5.0 standard drinks of alcohol    Types: 5 Cans of beer per week   Drug use: No   Sexual activity: Yes    Birth control/protection: Pill  Other Topics Concern   Not on file  Social History Narrative   Not on file   Social Drivers of Health   Financial Resource Strain: Not on file  Food Insecurity: Not on file  Transportation Needs: Not on file  Physical Activity: Not on file  Stress: Not on file  Social Connections: Not on file  Intimate Partner Violence: Not on file    FAMILY HISTORY: Family History  Problem Relation Age of Onset   Breast cancer Mother    Heart disease Father 80   Diabetes Father    Colon cancer Neg Hx    Colon polyps Neg Hx      PHYSICAL EXAMINATION  ECOG PERFORMANCE STATUS: 1 - Symptomatic but completely  ambulatory  Vitals:   12/18/23 1332  BP: (!) 105/57  Pulse: 72  Resp: 17  Temp: 98.2 F (36.8 C)  SpO2: 100%    She appears well, alert, oriented and in no acute distress.  LABORATORY DATA:  None for this visit   ASSESSMENT and THERAPY PLAN:   Malignant neoplasm of upper-outer quadrant of left breast in female, estrogen receptor positive (HCC) Erika Harvey is a 63 year old woman with history of stage Ia ER/PR positive breast cancer that was diagnosed in January 2023 status post lumpectomy, adjuvant radiation, and antiestrogen therapy with anastrozole  daily which began in May 2023, she took it for almost 2 yrs then suddenly started complaining of weight loss, loss of appetite and came to see us  saying she didn't want to keep taking it. We then switched to tamoxifen  which once again she didn't tolerate and she is here  for follow up.  Since her last visit here, she continues to have same issues.  Has been tells me that within days after stopping these medications, she feels so much better, her skin bruises get better, her appetite gets better and she feels like she is doing well again.  After much discussion and discussing the importance of antiestrogen therapy especially for lobular histology to reduce the risk of recurrence systemically, they decided to proceed with active surveillance especially since her quality of life has been severely impacted.  We have agreed to consider guardant reveal MRD testing every 6 months as well as annual diagnostic mammogram and follow-up in 6 months.  I strongly encouraged her to follow-up with gastroenterology if the symptoms persist despite discontinuation of the medication and she expressed understanding.   All questions were answered. The patient knows to call the clinic with any problems, questions or concerns. We can certainly see the patient much sooner if necessary.  Total encounter time:30 minutes*in face-to-face visit time, chart review, lab  review, care coordination, order entry, and documentation of the encounter time.   *Total Encounter Time as defined by the Centers for Medicare and Medicaid Services includes, in addition to the face-to-face time of a patient visit (documented in the note above) non-face-to-face time: obtaining and reviewing outside history, ordering and reviewing medications, tests or procedures, care coordination (communications with other health care professionals or caregivers) and documentation in the medical record.

## 2023-12-19 ENCOUNTER — Telehealth: Payer: Self-pay

## 2023-12-19 ENCOUNTER — Telehealth: Payer: Self-pay | Admitting: *Deleted

## 2023-12-19 DIAGNOSIS — R110 Nausea: Secondary | ICD-10-CM

## 2023-12-19 MED ORDER — ANASTROZOLE 1 MG PO TABS: 1.0000 mg | ORAL_TABLET | Freq: Every day | ORAL | 3 refills | Status: DC

## 2023-12-19 MED ORDER — ONDANSETRON 4 MG PO TBDP: 4.0000 mg | ORAL_TABLET | Freq: Three times a day (TID) | ORAL | 0 refills | Status: DC | PRN

## 2023-12-19 NOTE — Telephone Encounter (Signed)
 Orders entered for Guardant Reveal per MD. Requisition and all supporting documents faxed with confirmation.

## 2023-12-19 NOTE — Telephone Encounter (Signed)
 Pt called to this RN and stated post visit yesterday her and her husband had further discussion and she would like to go  back on the 1st pill, the radiation pill   This RN per chart review noted 1st antiestrogen pill as anastrozole  - and discussed with pt that the pill was started when she was under radiation but the pill does not have radiation in it.  Per discussion she will start the medication every other night- change time of day to evening when she takes.  Anti nausea medication will also be renewed per pt request.  Pt will call us  in 2 weeks with update.

## 2023-12-22 ENCOUNTER — Other Ambulatory Visit: Payer: Self-pay | Admitting: Family Medicine

## 2023-12-22 DIAGNOSIS — F419 Anxiety disorder, unspecified: Secondary | ICD-10-CM

## 2023-12-22 DIAGNOSIS — F320 Major depressive disorder, single episode, mild: Secondary | ICD-10-CM

## 2023-12-25 ENCOUNTER — Telehealth: Payer: Self-pay

## 2023-12-25 NOTE — Telephone Encounter (Signed)
 Pt called regarding paperwork for Guardant. She is very upset about completing this during her voicemail, tearful, stating her mother passed from breast cancer when she was 84. Called pt and she states this was resolved as she received a call from [employer] to schedule her test. Advised pt she should not need to complete paperwork, but maybe sign something when they come. This nurse completed paperwork for this test on 6/13. She verbalized thanks and understanding.

## 2023-12-26 ENCOUNTER — Telehealth: Payer: Self-pay

## 2023-12-26 DIAGNOSIS — R112 Nausea with vomiting, unspecified: Secondary | ICD-10-CM

## 2023-12-26 DIAGNOSIS — Z170 Estrogen receptor positive status [ER+]: Secondary | ICD-10-CM

## 2023-12-26 DIAGNOSIS — R110 Nausea: Secondary | ICD-10-CM

## 2023-12-26 DIAGNOSIS — R109 Unspecified abdominal pain: Secondary | ICD-10-CM

## 2023-12-26 DIAGNOSIS — R634 Abnormal weight loss: Secondary | ICD-10-CM

## 2023-12-26 NOTE — Telephone Encounter (Signed)
 Received call again today from pt, tearful, stating she is experiencing debilitating nausea since starting Anastrozole  back. S/w pt and she states she can barely get out of bed and she can't continue to take the cancer pill. She is taking Anastrozole . We spoke for a total of 45 minutes, explaining that she should take her omeprazole  and ondansetron  every morning, and take her Anastrozole  and ondansetron  every night. After explaining to pt and her husband several times, they verified education on this with readback. She knows to call in one week if symptoms have not subsided. In that case, we will switch her to Letrozole 2.5 mg daily per MD. Referral to GI placed per MD as well.

## 2024-01-01 ENCOUNTER — Telehealth: Payer: Self-pay

## 2024-01-01 ENCOUNTER — Encounter: Payer: Self-pay | Admitting: Gastroenterology

## 2024-01-01 NOTE — Telephone Encounter (Signed)
 Returned patient's call regarding questions on taking anastrozole  and whether or not she needs to go back to taking it every evening.  Patient had been experiencing significant n/v and had been advised to take the drug every other evening.  Since changing the frequency and modifying antiemetic routine, patient reports significant improvement in n/v and appetite has increased.  Patient aware that the goal would be to see if she can eventually get back to taking it every evening. Patient agreeable to giving the new regimen a few more days and calling the office back on Monday to determine if she's feeling well enough and comfortable in her current antiemetic regimen to go back to taking every evening.  Patient agreeable to the plan and will call the office back on Monday.

## 2024-01-02 ENCOUNTER — Other Ambulatory Visit: Payer: Self-pay | Admitting: Family Medicine

## 2024-01-02 ENCOUNTER — Other Ambulatory Visit: Payer: Self-pay

## 2024-01-02 DIAGNOSIS — I10 Essential (primary) hypertension: Secondary | ICD-10-CM

## 2024-01-02 MED ORDER — ATORVASTATIN CALCIUM 20 MG PO TABS: 20.0000 mg | ORAL_TABLET | Freq: Every day | ORAL | 1 refills | Status: DC

## 2024-01-02 MED ORDER — LOSARTAN POTASSIUM 100 MG PO TABS: 100.0000 mg | ORAL_TABLET | Freq: Every day | ORAL | 11 refills | Status: DC

## 2024-01-06 ENCOUNTER — Other Ambulatory Visit: Payer: Self-pay | Admitting: Family Medicine

## 2024-01-07 ENCOUNTER — Other Ambulatory Visit: Payer: Self-pay | Admitting: Family Medicine

## 2024-01-07 DIAGNOSIS — F419 Anxiety disorder, unspecified: Secondary | ICD-10-CM

## 2024-01-07 NOTE — Telephone Encounter (Unsigned)
 Copied from CRM [id number]. Topic: Clinical - Medication Refill >> Jan 07, 2024 12:58 PM Tiffany S wrote: Medication: traMADol  (ULTRAM ) 50 MG tablet [[id number]] diazepam  (VALIUM ) 5 MG tablet [[id number]]  Has the patient contacted their pharmacy? Yes (Agent: If no, request that the patient contact the pharmacy for the refill. If patient does not wish to contact the pharmacy document the reason why and proceed with request.) (Agent: If yes, when and what did the pharmacy advise?)  This is the patient's preferred pharmacy:  [id number] - [place], [place] - [address]  [place] [address] Phone: [phone] Fax: [phone]  Is this the correct pharmacy for this prescription? Yes If no, delete pharmacy and type the correct one.   Has the prescription been filled recently? Yes  Is the patient out of the medication? Yes  Has the patient been seen for an appointment in the last year OR does the patient have an upcoming appointment? Yes  Can we respond through MyChart? Yes  Agent: Please be advised that Rx refills may take up to 3 business days. We ask that you follow-up with your pharmacy.

## 2024-01-07 NOTE — Telephone Encounter (Signed)
 Copied from CRM [id number]. Topic: Clinical - Medication Refill >> Jan 07, 2024 12:58 PM Tiffany S wrote: Medication: traMADol  (ULTRAM ) 50 MG tablet [[id number]] diazepam  (VALIUM ) 5 MG tablet [[id number]]  Has the patient contacted their pharmacy? Yes (Agent: If no, request that the patient contact the pharmacy for the refill. If patient does not wish to contact the pharmacy document the reason why and proceed with request.) (Agent: If yes, when and what did the pharmacy advise?)  This is the patient's preferred pharmacy:  [facility] [id number] - [place], [place] - [address] [place] [address] Phone: [phone] Fax: [phone]  Is this the correct pharmacy for this prescription? Yes If no, delete pharmacy and type the correct one.   Has the prescription been filled recently? Yes  Is the patient out of the medication? Yes  Has the patient been seen for an appointment in the last year OR does the patient have an upcoming appointment? Yes  Can we respond through MyChart? Yes  Agent: Please be advised that Rx refills may take up to 3 business days. We ask that you follow-up with your pharmacy.

## 2024-01-09 NOTE — Telephone Encounter (Signed)
 Requested medications are due for refill today.  no  Requested medications are on the active medications list.  yes  Last refill. Tramadol  refilled 01/08/2024 #120 0 rf, Valium  refilled 12/12/2023 #60 2 rf  Future visit scheduled.   yes  Notes to clinic.  Refill/refusal not delegated.    Requested Prescriptions  Pending Prescriptions Disp Refills   traMADol  (ULTRAM ) 50 MG tablet 120 tablet 0    Sig: Take 1 tablet (50 mg total) by mouth every 6 (six) hours as needed.     Not Delegated - Analgesics:  Opioid Agonists Failed - 01/09/2024  4:46 PM      Failed - This refill cannot be delegated      Failed - Urine Drug Screen completed in last 360 days      Failed - Valid encounter within last 3 months    Recent Outpatient Visits           5 months ago Bilateral chronic knee pain   [place] [facility] Pickard, Butler [name], MD   9 months ago Pure hypercholesterolemia   [place] [facility] Pickard, Butler [name], MD   1 year ago Bilateral chronic knee pain   [place] [facility] Duanne, Butler [name], MD   1 year ago Primary hypertension   [place] [facility] Duanne, Butler [name], MD   1 year ago Pleural effusion, right   [place] [facility] Pickard, Butler [name], MD               diazepam  (VALIUM ) 5 MG tablet 60 tablet 2    Sig: TAKE 1 TABLET BY MOUTH 2 TIMES A DAY AS NEEDED FOR ANXIETY **NEEDS TO MAKE AN APPOINTMENT FOR FUTURE REFILLS**     Not Delegated - Psychiatry: Anxiolytics/Hypnotics 2 Failed - 01/09/2024  4:46 PM      Failed - This refill cannot be delegated      Failed - Urine Drug Screen completed in last 360 days      Failed - Valid encounter within last 6 months    Recent Outpatient Visits           5 months ago Bilateral chronic knee pain   [place] [facility] Duanne Butler [name], MD   9 months ago Pure hypercholesterolemia   [facility] Pickard, Butler [name], MD   1 year ago Bilateral chronic knee pain   [place] [facility] Duanne, Butler [name], MD   1 year ago Primary hypertension   [place] [facility] Duanne, Butler [name], MD   1 year ago Pleural effusion, right   [place] [facility] Pickard, Butler [name], MD              Passed - Patient is not pregnant

## 2024-01-12 MED ORDER — TRAMADOL HCL 50 MG PO TABS: 50.0000 mg | ORAL_TABLET | Freq: Four times a day (QID) | ORAL | 0 refills | Status: DC | PRN

## 2024-01-12 MED ORDER — DIAZEPAM 5 MG PO TABS: ORAL_TABLET | ORAL | 2 refills | Status: DC

## 2024-01-23 ENCOUNTER — Encounter: Payer: Self-pay | Admitting: Hematology and Oncology

## 2024-01-27 ENCOUNTER — Other Ambulatory Visit: Payer: Self-pay | Admitting: Family Medicine

## 2024-01-27 ENCOUNTER — Other Ambulatory Visit: Payer: Self-pay

## 2024-01-27 ENCOUNTER — Encounter: Payer: Self-pay | Admitting: Family Medicine

## 2024-01-27 ENCOUNTER — Ambulatory Visit: Admitting: Family Medicine

## 2024-01-27 ENCOUNTER — Telehealth: Payer: Self-pay

## 2024-01-27 VITALS — BP 140/80 | HR 66 | Temp 97.6°F | Ht 63.0 in | Wt 116.8 lb

## 2024-01-27 DIAGNOSIS — M25562 Pain in left knee: Secondary | ICD-10-CM | POA: Diagnosis not present

## 2024-01-27 DIAGNOSIS — G8929 Other chronic pain: Secondary | ICD-10-CM | POA: Diagnosis not present

## 2024-01-27 DIAGNOSIS — G894 Chronic pain syndrome: Secondary | ICD-10-CM

## 2024-01-27 DIAGNOSIS — M51369 Other intervertebral disc degeneration, lumbar region without mention of lumbar back pain or lower extremity pain: Secondary | ICD-10-CM

## 2024-01-27 DIAGNOSIS — R110 Nausea: Secondary | ICD-10-CM

## 2024-01-27 DIAGNOSIS — M25561 Pain in right knee: Secondary | ICD-10-CM | POA: Diagnosis not present

## 2024-01-27 MED ORDER — CYCLOBENZAPRINE HCL 10 MG PO TABS: ORAL_TABLET | ORAL | 2 refills | Status: AC

## 2024-01-27 NOTE — Telephone Encounter (Signed)
 Copied from CRM [id number]. Topic: Clinical - Medication Question >> Jan 27, 2024 11:18 AM Marissa P wrote: Reason for CRM: Patient is requesting for her muscle spasm pill as well please she forgot the name

## 2024-01-27 NOTE — Telephone Encounter (Unsigned)
 Copied from CRM [id number]. Topic: Clinical - Medication Refill >> Jan 27, 2024 11:13 AM Marissa P wrote: Medication:  ondansetron  (ZOFRAN -ODT) 4 MG disintegrating tablet   Has the patient contacted their pharmacy? No (Agent: If no, request that the patient contact the pharmacy for the refill. If patient does not wish to contact the pharmacy document the reason why and proceed with request.) (Agent: If yes, when and what did the pharmacy advise?)  This is the patient's preferred pharmacy:  [facility] [id number] - [place], [place] - [address] [place] [address] Phone: [phone] Fax: [phone]  Is this the correct pharmacy for this prescription? Yes If no, delete pharmacy and type the correct one.   Has the prescription been filled recently? Yes  Is the patient out of the medication? Yes  Has the patient been seen for an appointment in the last year OR does the patient have an upcoming appointment? Yes  Can we respond through MyChart? Yes  Agent: Please be advised that Rx refills may take up to 3 business days. We ask that you follow-up with your pharmacy.

## 2024-01-27 NOTE — Progress Notes (Signed)
 +   Subjective:    Patient ID: Erika Harvey, female    DOB: Mar 12, 1961, 63 y.o.   MRN: [id number]  Knee Pain   Patient presents today complaining of bilateral chronic knee pain.  She has had 2 previous knee surgeries.  Both knees hurt equally.  She complains of pain underneath the patella bilaterally.  She denies any laxity to varus or valgus stress.  She has been receiving cortisone injections periodically for the knee pain.  The last time I gave her a cortisone shot in her knees was in January.  Therefore, it has been 6 months.  She is requesting a repeat injection today. Past Medical History:  Diagnosis Date   Allergy    Anxiety    Arthritis    Breast cancer (HCC) 07/19/2021   Chronic low back pain    Fibroid 03/30/2015   Frequent UTI    Hypertension    Personal history of radiation therapy    Vaginal bleeding 03/30/2015   Past Surgical History:  Procedure Laterality Date   BACK SURGERY  05/16/2015   BREAST BIOPSY Left 07/19/2021   BREAST LUMPECTOMY Left 08/08/2021   BREAST LUMPECTOMY WITH RADIOACTIVE SEED AND SENTINEL LYMPH NODE BIOPSY Left 08/08/2021   Procedure: LEFT BREAST LUMPECTOMY WITH RADIOACTIVE SEED AND SENTINEL LYMPH NODE BIOPSY;  Surgeon: Vernetta Berg, MD;  Location: [facility];  Service: General;  Laterality: Left;   KNEE SURGERY     left   KNEE SURGERY Left 07/08/2012   WISDOM TOOTH EXTRACTION     Current Outpatient Medications on File Prior to Visit  Medication Sig Dispense Refill   anastrozole  (ARIMIDEX ) 1 MG tablet Take 1 tablet (1 mg total) by mouth daily. 30 tablet 3   atenolol  (TENORMIN ) 25 MG tablet TAKE 1 TABLET BY MOUTH DAILY 90 tablet 1   atorvastatin  (LIPITOR) 20 MG tablet Take 1 tablet (20 mg total) by mouth daily. 30 tablet 1   calcium -vitamin D (OSCAL WITH D) 250-125 MG-UNIT per tablet Take 1 tablet by mouth daily.     cyclobenzaprine  (FLEXERIL ) 10 MG tablet TAKE ONE TABLET BY MOUTH THREE TIMES A DAY AS NEEDED FOR MUSCLE  SPASMS 60 tablet 2   diazepam  (VALIUM ) 5 MG tablet TAKE 1 TABLET BY MOUTH 2 TIMES A DAY AS NEEDED FOR ANXIETY **NEEDS TO MAKE AN APPOINTMENT FOR FUTURE REFILLS** 60 tablet 2   diclofenac  Sodium (VOLTAREN ) 1 % GEL Apply 2 g topically 4 (four) times daily. 100 g 5   DULoxetine  (CYMBALTA ) 60 MG capsule TAKE 1 CAPSULE BY MOUTH DAILY 30 capsule 1   fish oil-omega-3 fatty acids  1000 MG capsule Take 2 capsules (2 g total) by mouth 2 (two) times daily. 14 capsule 0   fluticasone  (FLONASE ) 50 MCG/ACT nasal spray Place 2 sprays into both nostrils daily. 16 g 6   losartan  (COZAAR ) 100 MG tablet Take 1 tablet (100 mg total) by mouth daily. 90 tablet 1   losartan  (COZAAR ) 100 MG tablet Take 1 tablet (100 mg total) by mouth daily. 30 tablet 11   omeprazole  (PRILOSEC) 20 MG capsule TAKE 1 CAPSULE BY MOUTH DAILY 90 capsule 1   ondansetron  (ZOFRAN -ODT) 4 MG disintegrating tablet Take 1 tablet (4 mg total) by mouth every 8 (eight) hours as needed. 20 tablet 0   polyethylene glycol (MIRALAX ) 17 g packet Take 17 g twice daily until you have a large bowel movement, then begin taking one packet daily as needed for constipation. 30 each 0   senna-docusate (  SENOKOT-S) 8.6-50 MG tablet Take 1 tablet by mouth at bedtime as needed for mild constipation. 30 tablet 0   traMADol  (ULTRAM ) 50 MG tablet TAKE 1 TABLET BY MOUTH EVERY 6 HOURS AS NEEDED 120 tablet 0   zolpidem  (AMBIEN ) 10 MG tablet Take 1 tablet (10 mg total) by mouth at bedtime as needed. for sleep 30 tablet 3   traMADol  (ULTRAM ) 50 MG tablet Take 1 tablet (50 mg total) by mouth every 6 (six) hours as needed. 120 tablet 0   No current facility-administered medications on file prior to visit.   Allergies  Allergen Reactions   Baclofen    Norvasc [Amlodipine Besylate] Swelling and Rash   Social History   Socioeconomic History   Marital status: Married    Spouse name: Not on file   Number of children: Not on file   Years of education: Not on file   Highest  education level: Not on file  Occupational History   Not on file  Tobacco Use   Smoking status: Never   Smokeless tobacco: Never  Vaping Use   Vaping status: Never Used  Substance and Sexual Activity   Alcohol use: Yes    Alcohol/week: 5.0 standard drinks of alcohol    Types: 5 Cans of beer per week   Drug use: No   Sexual activity: Yes    Birth control/protection: Pill  Other Topics Concern   Not on file  Social History Narrative   Not on file   Social Drivers of Health   Financial Resource Strain: Not on file  Food Insecurity: Not on file  Transportation Needs: Not on file  Physical Activity: Not on file  Stress: Not on file  Social Connections: Not on file  Intimate Partner Violence: Not on file     Review of Systems  All other systems reviewed and are negative.      Objective:   Physical Exam Vitals reviewed.  Constitutional:      Appearance: Normal appearance.  Cardiovascular:     Rate and Rhythm: Normal rate and regular rhythm.     Heart sounds: Normal heart sounds.  Pulmonary:     Effort: Pulmonary effort is normal. No respiratory distress.     Breath sounds: Normal breath sounds. No stridor. No wheezing, rhonchi or rales.  Chest:     Chest wall: No tenderness.  Abdominal:     General: Abdomen is flat. Bowel sounds are normal.     Palpations: Abdomen is soft.  Musculoskeletal:     Right knee: Bony tenderness and crepitus present. Decreased range of motion. No LCL laxity, MCL laxity, ACL laxity or PCL laxity.     Left knee: Bony tenderness and crepitus present. Decreased range of motion. No LCL laxity, MCL laxity, ACL laxity or PCL laxity. Neurological:     Mental Status: She is alert.     Soft tissue swelling anterior to knee.       Assessment & Plan:  Bilateral chronic knee pain  Likely due to osteoarthritis in both knees.  Using sterile technique, I injected the right knee with 2 cc lidocaine , 2 cc of Marcaine , and 2 cc of 40 mg/mL  Kenalog .  Then using sterile technique I injected the left knee with 2 cc lidocaine , 2 cc of Marcaine , and 2 cc of 40 mg/mL Kenalog .

## 2024-01-28 ENCOUNTER — Other Ambulatory Visit: Payer: Self-pay | Admitting: *Deleted

## 2024-01-29 MED ORDER — ONDANSETRON 4 MG PO TBDP: 4.0000 mg | ORAL_TABLET | Freq: Three times a day (TID) | ORAL | 1 refills | Status: DC | PRN

## 2024-01-29 NOTE — Telephone Encounter (Signed)
 Requested medication (s) are due for refill today: yes  Requested medication (s) are on the active medication list: yes  Last refill:  12/19/23  Future visit scheduled: no  Notes to clinic:  Unable to refill per protocol, cannot delegate.last refilled by another provider.      Requested Prescriptions  Pending Prescriptions Disp Refills   ondansetron  (ZOFRAN -ODT) 4 MG disintegrating tablet 20 tablet 0    Sig: Take 1 tablet (4 mg total) by mouth every 8 (eight) hours as needed.     Not Delegated - Gastroenterology: Antiemetics - ondansetron  Failed - 01/29/2024  1:24 PM      Failed - This refill cannot be delegated      Passed - AST in normal range and within 360 days    AST  Date Value Ref Range Status  03/27/2023 14 10 - 35 U/L Final         Passed - ALT in normal range and within 360 days    ALT  Date Value Ref Range Status  03/27/2023 14 6 - 29 U/L Final         Passed - Valid encounter within last 6 months    Recent Outpatient Visits           2 days ago Bilateral chronic knee pain   [place] [facility] Duanne Butler [name], MD   5 months ago Bilateral chronic knee pain   [place] [facility] Duanne, Butler [name], MD   10 months ago Pure hypercholesterolemia   [place] [facility] Duanne, Butler [name], MD   1 year ago Bilateral chronic knee pain   [place] [facility] Duanne, Butler [name], MD   1 year ago Primary hypertension   [place] [facility] Pickard, Butler [name], MD

## 2024-02-02 DIAGNOSIS — M25561 Pain in right knee: Secondary | ICD-10-CM

## 2024-02-02 DIAGNOSIS — M25562 Pain in left knee: Secondary | ICD-10-CM

## 2024-02-02 DIAGNOSIS — G8929 Other chronic pain: Secondary | ICD-10-CM

## 2024-02-02 MED ORDER — TRIAMCINOLONE ACETONIDE 40 MG/ML IJ SUSP
80.0000 mg | Freq: Once | INTRAMUSCULAR | Status: AC
Start: 2024-02-02 — End: 2024-01-27
  Administered 2024-01-27: 80 mg via INTRA_ARTICULAR

## 2024-02-02 MED ORDER — TRIAMCINOLONE ACETONIDE 40 MG/ML IJ SUSP
80.0000 mg | Freq: Once | INTRAMUSCULAR | Status: AC
  Administered 2024-02-02: 80 mg via INTRA_ARTICULAR

## 2024-02-02 NOTE — Addendum Note (Signed)
 Addended by: ANGELENA RONAL BRADLEY K on: 02/02/2024 09:50 AM   Modules accepted: Orders

## 2024-02-21 ENCOUNTER — Other Ambulatory Visit: Payer: Self-pay | Admitting: Family Medicine

## 2024-02-21 DIAGNOSIS — F419 Anxiety disorder, unspecified: Secondary | ICD-10-CM

## 2024-02-21 DIAGNOSIS — F320 Major depressive disorder, single episode, mild: Secondary | ICD-10-CM

## 2024-02-24 NOTE — Telephone Encounter (Signed)
 Requested medication (s) are due for refill today: na   Requested medication (s) are on the active medication list: yes   Last refill:  12/22/23 #30 1 refills  Future visit scheduled: no   Notes to clinic:  protocol failed 01/27/24 BP 140/80. Do you want to refill Rx?     Requested Prescriptions  Pending Prescriptions Disp Refills   DULoxetine  (CYMBALTA ) 60 MG capsule [Pharmacy Med Name: DULoxetine  HCL DR 60 MG CAPSULE] 30 capsule 1    Sig: TAKE 1 CAPSULE BY MOUTH DAILY     Psychiatry: Antidepressants - SNRI - duloxetine  Failed - 02/24/2024  1:33 PM      Failed - Last BP in normal range    BP Readings from Last 1 Encounters:  01/27/24 (!) 140/80         Passed - Cr in normal range and within 360 days    Creat  Date Value Ref Range Status  03/27/2023 1.05 0.50 - 1.05 mg/dL Final   Creatinine, Urine  Date Value Ref Range Status  09/03/2022 104 20 - 275 mg/dL Final         Passed - eGFR is 30 or above and within 360 days    GFR, Est African American  Date Value Ref Range Status  07/08/2019 71 > OR = 60 mL/min/1.1m2 Final   GFR, Est Non African American  Date Value Ref Range Status  07/08/2019 62 > OR = 60 mL/min/1.34m2 Final   eGFR  Date Value Ref Range Status  03/27/2023 60 > OR = 60 mL/min/1.73m2 Final         Passed - Completed PHQ-2 or PHQ-9 in the last 360 days      Passed - Valid encounter within last 6 months    Recent Outpatient Visits           4 weeks ago Bilateral chronic knee pain   [place] [facility] Du[facility] Butler [name], MD   6 months ago Bilateral chronic knee pain   [place] Duanne, Butler [name], MD   11 months ago Pure hypercholesterolemia   [place] [facility] Duanne, Butler [name], MD   1 year ago Bilateral chronic knee pain   [place] [facility] Duanne, Butler [name], MD   1 year ago Primary hypertension   [place] [facility] Pickard,  Butler [name], MD

## 2024-02-27 ENCOUNTER — Other Ambulatory Visit: Payer: Self-pay | Admitting: Family Medicine

## 2024-02-27 DIAGNOSIS — F419 Anxiety disorder, unspecified: Secondary | ICD-10-CM

## 2024-02-27 DIAGNOSIS — F320 Major depressive disorder, single episode, mild: Secondary | ICD-10-CM

## 2024-02-27 NOTE — Telephone Encounter (Signed)
 Copied from CRM #[id number]. Topic: Clinical - Medication Refill >> Feb 27, 2024 11:47 AM Carlatta H wrote: Medication: DULoxetine  (CYMBALTA ) 60 MG capsule  Has the patient contacted their pharmacy? Yes (Agent: If no, request that the patient contact the pharmacy for the refill. If patient does not wish to contact the pharmacy document the reason why and proceed with request.) (Agent: If yes, when and what did the pharmacy advise?)Advised to contact offic  This is the patient's preferred pharmacy:  [id number] [place], [place] - [address] [place] [address] Phone: [phone] Fax: [phone]  Is this the correct pharmacy for this prescription? Yes If no, delete pharmacy and type the correct one.   Has the prescription been filled recently? No  Is the patient out of the medication? Yes  Has the patient been seen for an appointment in the last year OR does the patient have an upcoming appointment? No  Can we respond through MyChart? Yes  Agent: Please be advised that Rx refills may take up to 3 business days. We ask that you follow-up with your pharmacy.

## 2024-03-01 ENCOUNTER — Other Ambulatory Visit: Payer: Self-pay | Admitting: Family Medicine

## 2024-03-01 ENCOUNTER — Ambulatory Visit: Payer: Self-pay

## 2024-03-01 ENCOUNTER — Other Ambulatory Visit: Payer: Self-pay

## 2024-03-01 DIAGNOSIS — F419 Anxiety disorder, unspecified: Secondary | ICD-10-CM

## 2024-03-01 DIAGNOSIS — F320 Major depressive disorder, single episode, mild: Secondary | ICD-10-CM

## 2024-03-01 MED ORDER — DULOXETINE HCL 60 MG PO CPEP: 60.0000 mg | ORAL_CAPSULE | Freq: Every day | ORAL | 1 refills | Status: AC

## 2024-03-01 NOTE — Telephone Encounter (Unsigned)
 Copied from CRM #[id number]. Topic: Clinical - Medication Refill >> Mar 01, 2024  9:07 AM Fonda T wrote: Medication: DULoxetine  (CYMBALTA ) 60 MG capsule   Has the patient contacted their pharmacy? Yes, advised to contact office  This is the patient's preferred pharmacy:  [facility] [id number] - [place], [place] - [address] [place] [address] Phone: [phone] Fax: [phone]  Is this the correct pharmacy for this prescription? Yes If no, delete pharmacy and type the correct one.   Has the prescription been filled recently? Yes  Is the patient out of the medication? Yes  Has the patient been seen for an appointment in the last year OR does the patient have an upcoming appointment? Yes  Can we respond through MyChart? No, prefers a phone call  Agent: Please be advised that Rx refills may take up to 3 business days. We ask that you follow-up with your pharmacy.

## 2024-03-01 NOTE — Telephone Encounter (Signed)
 Rx 03/01/24 #90 1RF- duplicate request Requested Prescriptions  Pending Prescriptions Disp Refills   DULoxetine  (CYMBALTA ) 60 MG capsule 30 capsule 1    Sig: Take 1 capsule (60 mg total) by mouth daily.     Psychiatry: Antidepressants - SNRI - duloxetine  Failed - 03/01/2024 10:32 AM      Failed - Last BP in normal range    BP Readings from Last 1 Encounters:  01/27/24 (!) 140/80         Passed - Cr in normal range and within 360 days    Creat  Date Value Ref Range Status  03/27/2023 1.05 0.50 - 1.05 mg/dL Final   Creatinine, Urine  Date Value Ref Range Status  09/03/2022 104 20 - 275 mg/dL Final         Passed - eGFR is 30 or above and within 360 days    GFR, Est African American  Date Value Ref Range Status  07/08/2019 71 > OR = 60 mL/min/1.69m2 Final   GFR, Est Non African American  Date Value Ref Range Status  07/08/2019 62 > OR = 60 mL/min/1.45m2 Final   eGFR  Date Value Ref Range Status  03/27/2023 60 > OR = 60 mL/min/1.78m2 Final         Passed - Completed PHQ-2 or PHQ-9 in the last 360 days      Passed - Valid encounter within last 6 months    Recent Outpatient Visits           1 month ago Bilateral chronic knee pain   [place] [facility] [place] Butler [name], MD   6 months ago Bilateral chronic knee pain   [place] [facility] Duanne, Butler [name], MD   11 months ago Pure hypercholesterolemia   [place] [facility] Duanne, Butler [name], MD   1 year ago Bilateral chronic knee pain   [place] [facility] Duanne, Butler [name], MD   1 year ago Primary hypertension   [place] [facility] Pickard, Butler [name], MD

## 2024-03-01 NOTE — Telephone Encounter (Signed)
 FYI Only or Action Required?: Action required by provider: medication refill request.  Patient was last seen in primary care on 01/27/2024 by Erika Butler [name], MD.  Called Nurse Triage reporting Anxiety.  Symptoms began several days ago.  Interventions attempted: Rest, hydration, or home remedies.  Symptoms are: gradually worsening.  Triage Disposition: Call PCP Within 24 Hours  Patient/caregiver understands and will follow disposition?: Yes      Copied from CRM #[id number]. Topic: Clinical - Red Word Triage >> Mar 01, 2024  9:09 AM Treva T wrote: [facility] that prompted transfer to Nurse Triage: Received call from patient, state she is out of anxiety medication, DULoxetine  (CYMBALTA ) 60 MG capsule, and experiencing symptoms of increased shakiness, anxiety, and nausea. Answer Assessment - Initial Assessment Questions Patient states she needs Cymbalta  sent to [facility] asap, she is completely   1. CONCERN: Did anything happen that prompted you to call today?      Patient is out of Cymbalta  and states it is causing her to feeling more anxious, nausea.  2. ANXIETY SYMPTOMS: Can you describe how you (your loved one; patient) have been feeling? (e.g., tense, restless, panicky, anxious, keyed up, overwhelmed, sense of impending doom).      Feeling more anxious due to needing Cymbalta . Patient has been out since the 22nd 3. ONSET: How long have you been feeling this way? (e.g., hours, days, weeks)     Since being out of Cymbalta  on Aug 22 4. SEVERITY: How would you rate the level of anxiety? (e.g., 0 - 10; or mild, moderate, severe).     Mild 7. RISK OF HARM - SUICIDAL IDEATION: Do you ever have thoughts of hurting or killing yourself? If Yes, ask:  Do you have these feelings now? Do you have a plan on how you would do this?     No 8. TREATMENT:  What has been done so far to treat this anxiety? (e.g., medicines, relaxation strategies). What has helped?     Patient is  on Cymbalta  but is currently out and requesting immediate refill 12. OTHER SYMPTOMS: Do you have any other symptoms? (e.g., feeling depressed, trouble concentrating, trouble sleeping, trouble breathing, palpitations or fast heartbeat, chest pain, sweating, nausea, or diarrhea)       Increased anxiety, shakiness, nausea  Patient denies chest pressure, abnormal sweating  Protocols used: Anxiety and Panic Attack-A-AH

## 2024-03-02 NOTE — Telephone Encounter (Signed)
 Duplicate request, L[facility] 03/01/24.  Requested Prescriptions  Pending Prescriptions Disp Refills   DULoxetine  (CYMBALTA ) 60 MG capsule 90 capsule 1    Sig: Take 1 capsule (60 mg total) by mouth daily.     Psychiatry: Antidepressants - SNRI - duloxetine  Failed - 03/02/2024  1:53 PM      Failed - Last BP in normal range    BP Readings from Last 1 Encounters:  01/27/24 (!) 140/80         Passed - Cr in normal range and within 360 days    Creat  Date Value Ref Range Status  03/27/2023 1.05 0.50 - 1.05 mg/dL Final   Creatinine, Urine  Date Value Ref Range Status  09/03/2022 104 20 - 275 mg/dL Final         Passed - eGFR is 30 or above and within 360 days    GFR, Est African American  Date Value Ref Range Status  07/08/2019 71 > OR = 60 mL/min/1.40m2 Final   GFR, Est Non African American  Date Value Ref Range Status  07/08/2019 62 > OR = 60 mL/min/1.14m2 Final   eGFR  Date Value Ref Range Status  03/27/2023 60 > OR = 60 mL/min/1.80m2 Final         Passed - Completed PHQ-2 or PHQ-9 in the last 360 days      Passed - Valid encounter within last 6 months    Recent Outpatient Visits           1 month ago Bilateral chronic knee pain   [place] [facility] Duanne Butler [name], MD   6 months ago Bilateral chronic knee pain   [place] [facility] [place], Butler [name], MD   11 months ago Pure hypercholesterolemia   [place] [facility] Duanne, Butler [name], MD   1 year ago Bilateral chronic knee pain   [place] [facility] Duanne, Butler [name], MD   1 year ago Primary hypertension    [place] [place], Butler [name], MD

## 2024-03-03 ENCOUNTER — Other Ambulatory Visit: Payer: Self-pay | Admitting: Family Medicine

## 2024-03-09 ENCOUNTER — Other Ambulatory Visit: Payer: Self-pay | Admitting: Family Medicine

## 2024-03-09 ENCOUNTER — Ambulatory Visit: Payer: Self-pay

## 2024-03-09 MED ORDER — CEPHALEXIN 500 MG PO CAPS: 500.0000 mg | ORAL_CAPSULE | Freq: Three times a day (TID) | ORAL | 0 refills | Status: AC

## 2024-03-09 NOTE — Telephone Encounter (Signed)
 Patient states would request antibiotic from provider as is financially unable to pay for an appointment. If unable to provide without appointment and/or if ordered, please contact patient. She is aware that may be a possibility.  FYI Only or Action Required?: Action required by provider: clinical question for provider.  Patient was last seen in primary care on 01/27/2024 by Duanne Butler [name], MD.  Called Nurse Triage reporting Dysuria.  Symptoms began several days ago.  Interventions attempted: Rest, hydration, or home remedies.  Symptoms are: gradually worsening.  Triage Disposition: See Physician Within 24 Hours  Patient/caregiver understands and will follow disposition?: No, refuses disposition Reason for Disposition  Bad or foul-smelling urine  Answer Assessment - Initial Assessment Questions Patient reports chronic UTIs. Requesting antibiotic.   1. SYMPTOM: What's the main symptom you're concerned about? (e.g., frequency, incontinence)     Malodorous urine  2. ONSET:      4 days  3. PAIN: Is there any pain? If Yes, ask: How bad is it? (Scale: 1-10; mild, moderate, severe)     Denies, but states it's going to come  4. CAUSE: What do you think is causing the symptoms?  Protocols used: Urinary Symptoms-A-AH Copied from CRM [id number]. Topic: Clinical - Medication Question >> Mar 09, 2024  8:27 AM Myrick T wrote: Reason for CRM: patient called said she thinks she has an UTI and need medication for the symptoms. She says always took and ciprofloxacin  (CIPRO ) 500 MG tablet and would like to have it again. Please f/u with patient

## 2024-03-11 ENCOUNTER — Other Ambulatory Visit: Payer: Self-pay | Admitting: Family Medicine

## 2024-03-12 NOTE — Telephone Encounter (Signed)
 Requested medication (s) are due for refill today: yes  Requested medication (s) are on the active medication list: yes  Last refill:  01/12/24 120 tab  Future visit scheduled: no  Notes to clinic:  med not delegated to NT to RF   Requested Prescriptions  Pending Prescriptions Disp Refills   traMADol  (ULTRAM ) 50 MG tablet [Pharmacy Med Name: TRAMADOL  HCL 50 MG TABLET] 120 tablet     Sig: TAKE 1 TABLET BY MOUTH EVERY 6 HOURS AS NEEDED     Not Delegated - Analgesics:  Opioid Agonists Failed - 03/12/2024  9:35 AM      Failed - This refill cannot be delegated      Failed - Urine Drug Screen completed in last 360 days      Passed - Valid encounter within last 3 months    Recent Outpatient Visits           1 month ago Bilateral chronic knee pain   [place] [facility] Duanne Butler [name], MD   7 months ago Bilateral chronic knee pain   [place] [facility] Duanne, Butler [name], MD   11 months ago Pure hypercholesterolemia   [place] [facility] Duanne, Butler [name], MD   1 year ago Bilateral chronic knee pain   [place] [facility] Duanne, Butler [name], MD   1 year ago Primary hypertension   [place] [facility] [place]ard, Butler [name], MD

## 2024-04-11 ENCOUNTER — Other Ambulatory Visit: Payer: Self-pay | Admitting: Family Medicine

## 2024-04-20 ENCOUNTER — Ambulatory Visit: Admitting: Family Medicine

## 2024-04-28 ENCOUNTER — Other Ambulatory Visit: Payer: Self-pay | Admitting: Family Medicine

## 2024-04-28 DIAGNOSIS — K219 Gastro-esophageal reflux disease without esophagitis: Secondary | ICD-10-CM

## 2024-04-30 ENCOUNTER — Other Ambulatory Visit: Payer: Self-pay

## 2024-04-30 ENCOUNTER — Telehealth: Payer: Self-pay | Admitting: Family Medicine

## 2024-04-30 DIAGNOSIS — R110 Nausea: Secondary | ICD-10-CM

## 2024-04-30 MED ORDER — ONDANSETRON 4 MG PO TBDP: 4.0000 mg | ORAL_TABLET | Freq: Three times a day (TID) | ORAL | 1 refills | Status: AC | PRN

## 2024-04-30 NOTE — Telephone Encounter (Signed)
 Copied from CRM [id number]. Topic: Clinical - Medication Question >> Apr 30, 2024  1:26 PM Amy B wrote: Reason for CRM: Patient states she is feeling nauseous and requests a refill of her antinausea medication but she does not remember the name.  She declined to speak with nurse triage.  Please advise.

## 2024-05-05 ENCOUNTER — Other Ambulatory Visit: Payer: Self-pay | Admitting: Family Medicine

## 2024-05-05 DIAGNOSIS — I10 Essential (primary) hypertension: Secondary | ICD-10-CM

## 2024-05-06 NOTE — Telephone Encounter (Signed)
 Requested medications are due for refill today.  yes  Requested medications are on the active medications list.  yes  Last refill. Atenolol  11/10/2023 #90 1 rf, Atorvastatin  03/03/2024 #30 1 rf  Future visit scheduled.   no  Notes to clinic.  Labs are expired.    Requested Prescriptions  Pending Prescriptions Disp Refills   atenolol  (TENORMIN ) 25 MG tablet [Pharmacy Med Name: ATENOLOL  25 MG TABLET] 30 tablet     Sig: TAKE 1 TABLET BY MOUTH DAILY     Cardiovascular: Beta Blockers 2 Failed - 05/06/2024  3:14 PM      Failed - Cr in normal range and within 360 days    Creat  Date Value Ref Range Status  03/27/2023 1.05 0.50 - 1.05 mg/dL Final   Creatinine, Urine  Date Value Ref Range Status  09/03/2022 104 20 - 275 mg/dL Final         Failed - Last BP in normal range    BP Readings from Last 1 Encounters:  01/27/24 (!) 140/80         Passed - Last Heart Rate in normal range    Pulse Readings from Last 1 Encounters:  01/27/24 66         Passed - Valid encounter within last 6 months    Recent Outpatient Visits           3 months ago Bilateral chronic knee pain   [place] [facility] Duanne Butler [name], MD   9 months ago Bilateral chronic knee pain   [place] [facility] Pickard, Butler [name], MD   1 year ago Pure hypercholesterolemia   [place] [facility] Duanne, Butler [name], MD   1 year ago Bilateral chronic knee pain   [place] [facility] Duanne, Butler [name], MD   1 year ago Primary hypertension   [place] [facility] [place], Butler [name], MD               atorvastatin  (LIPITOR) 20 MG tablet [Pharmacy Med Name: ATORVASTATIN  20 MG TABLET] 30 tablet 1    Sig: TAKE 1 TABLET BY MOUTH DAILY     Cardiovascular:  Antilipid - Statins Failed - 05/06/2024  3:14 PM      Failed - Lipid Panel in normal range within the last 12 months    Cholesterol  Date Value Ref Range Status   03/27/2023 160 <200 mg/dL Final   LDL Cholesterol (Calc)  Date Value Ref Range Status  03/27/2023 79 mg/dL (calc) Final    Comment:    Reference range: <100 . Desirable range <100 mg/dL for primary prevention;   <70 mg/dL for patients with CHD or diabetic patients  with > or = 2 CHD risk factors. SABRA LDL-C is now calculated using the Martin-Hopkins  calculation, which is a validated novel method providing  better accuracy than the Friedewald equation in the  estimation of LDL-C.  Gladis APPLETHWAITE et al. SANDREA. 7986;689(80): 2061-2068  (http://education.[employer].com/faq/FAQ164)    HDL  Date Value Ref Range Status  03/27/2023 56 > OR = 50 mg/dL Final   Triglycerides  Date Value Ref Range Status  03/27/2023 145 <150 mg/dL Final         Passed - Patient is not pregnant      Passed - Valid encounter within last 12 months    Recent Outpatient Visits           3 months ago  Bilateral chronic knee pain   [place] [facility] Duanne Butler [name], MD   9 months ago Bilateral chronic knee pain   [place] [facility] [place], Butler [name], MD   1 year ago Pure hypercholesterolemia   [place] [facility] Duanne, Butler [name], MD   1 year ago Bilateral chronic knee pain   [place] [facility] Duanne, Butler [name], MD   1 year ago Primary hypertension   [place] [facility] Pickard, Butler [name], MD

## 2024-05-13 ENCOUNTER — Other Ambulatory Visit: Payer: Self-pay | Admitting: Family Medicine

## 2024-05-14 ENCOUNTER — Other Ambulatory Visit: Payer: Self-pay | Admitting: Family Medicine

## 2024-05-15 NOTE — Telephone Encounter (Signed)
 Requested medication (s) are due for refill today: yes  Requested medication (s) are on the active medication list: yes  Last refill:  04/12/24  Future visit scheduled: {Yes  Notes to clinic:  Unable to refill per protocol, cannot delegate.      Requested Prescriptions  Pending Prescriptions Disp Refills   traMADol  (ULTRAM ) 50 MG tablet [Pharmacy Med Name: traMADol  HCL 50MG  TABLET] 120 tablet     Sig: TAKE 1 TABLET BY MOUTH EVERY 6 HOURS AS NEEDED     Not Delegated - Analgesics:  Opioid Agonists Failed - 05/15/2024  9:27 AM      Failed - This refill cannot be delegated      Failed - Urine Drug Screen completed in last 360 days      Failed - Valid encounter within last 3 months    Recent Outpatient Visits           3 months ago Bilateral chronic knee pain   [place] [facility] Medicine Duanne Butler [name], MD   9 months ago Bilateral chronic knee pain   [place] [facility] [place], Butler [name], MD   1 year ago Pure hypercholesterolemia   [place] [facility] Duanne Butler [name], MD   1 year ago Bilateral chronic knee pain   [place] [facility] Duanne Butler [name], MD   1 year ago Primary hypertension   [place] [facility] Medicine [place], Butler [name], MD

## 2024-05-19 NOTE — Telephone Encounter (Signed)
 Pharmacy sent script to follow up on refill requested for traMADol  (ULTRAM ) 50 MG tablet   Pharmacy:  [facility] [id number] [place], [place] - [address], [place] [address] Phone: [phone]  Fax: [phone]    Please advise pharmacist.

## 2024-05-21 NOTE — Telephone Encounter (Signed)
 Copied from CRM #[id number]. Topic: Clinical - Prescription Issue >> May 21, 2024 10:19 AM Emylou G wrote: Reason for CRM: Patient been waiting on her tramadol  since the 7th please advise

## 2024-06-07 ENCOUNTER — Ambulatory Visit: Admitting: Family Medicine

## 2024-06-17 ENCOUNTER — Telehealth: Payer: Self-pay | Admitting: Hematology and Oncology

## 2024-06-17 NOTE — Telephone Encounter (Signed)
 Patient called in to reschedule appointment with MD from 06/18/2024 to 07/05/2024. Patient aware of date/time change.

## 2024-06-18 ENCOUNTER — Other Ambulatory Visit: Payer: Self-pay | Admitting: Family Medicine

## 2024-06-18 ENCOUNTER — Ambulatory Visit: Admitting: Hematology and Oncology

## 2024-06-22 ENCOUNTER — Encounter

## 2024-06-24 ENCOUNTER — Encounter: Payer: Self-pay | Admitting: Family Medicine

## 2024-06-24 ENCOUNTER — Ambulatory Visit (INDEPENDENT_AMBULATORY_CARE_PROVIDER_SITE_OTHER): Admitting: Family Medicine

## 2024-06-24 VITALS — BP 108/70 | HR 80 | Temp 98.0°F | Ht 63.0 in | Wt 108.0 lb

## 2024-06-24 DIAGNOSIS — E7800 Pure hypercholesterolemia, unspecified: Secondary | ICD-10-CM | POA: Diagnosis not present

## 2024-06-24 DIAGNOSIS — R634 Abnormal weight loss: Secondary | ICD-10-CM | POA: Diagnosis not present

## 2024-06-24 DIAGNOSIS — M25562 Pain in left knee: Secondary | ICD-10-CM

## 2024-06-24 DIAGNOSIS — I10 Essential (primary) hypertension: Secondary | ICD-10-CM | POA: Diagnosis not present

## 2024-06-24 DIAGNOSIS — M25561 Pain in right knee: Secondary | ICD-10-CM

## 2024-06-24 DIAGNOSIS — Z23 Encounter for immunization: Secondary | ICD-10-CM | POA: Diagnosis not present

## 2024-06-24 DIAGNOSIS — G8929 Other chronic pain: Secondary | ICD-10-CM | POA: Diagnosis not present

## 2024-06-24 MED ORDER — TRIAMCINOLONE ACETONIDE 40 MG/ML IJ SUSP
80.0000 mg | Freq: Once | INTRAMUSCULAR | Status: AC
  Administered 2024-06-24: 12:00:00 80 mg via INTRA_ARTICULAR

## 2024-06-24 MED ORDER — LOSARTAN POTASSIUM 50 MG PO TABS: 50.0000 mg | ORAL_TABLET | Freq: Every day | ORAL | 3 refills | Status: AC

## 2024-06-24 NOTE — Progress Notes (Signed)
 +   Subjective:    Patient ID: Erika Harvey, female    DOB: [birth year], 63 y.o.   MRN: [id number]  Wt Readings from Last 3 Encounters:  06/24/24 108 lb (49 kg)  01/27/24 116 lb 12.8 oz (53 kg)  12/18/23 115 lb 3.2 oz (52.3 kg)   Patient has lost 8 pounds since her last office visit in July.  She has a history of breast cancer.  She reports bilateral knee pain.  She is requesting cortisone injections in her knees.  She denies any abdominal pain.  She denies any chest pain.  She denies any shortness of breath.  She denies any fevers or chills.  Her blood pressure today is low.  I repeated this myself.  She is currently on losartan  100 mg a day and atenolol  25 mg a day.  She denies feeling faint or lightheaded. Past Medical History:  Diagnosis Date   Allergy    Anxiety    Arthritis    Breast cancer (HCC) 07/19/2021   Chronic low back pain    Fibroid 03/30/2015   Frequent UTI    Hypertension    Personal history of radiation therapy    Vaginal bleeding 03/30/2015   Past Surgical History:  Procedure Laterality Date   BACK SURGERY  05/16/2015   BREAST BIOPSY Left 07/19/2021   BREAST LUMPECTOMY Left 08/08/2021   BREAST LUMPECTOMY WITH RADIOACTIVE SEED AND SENTINEL LYMPH NODE BIOPSY Left 08/08/2021   Procedure: LEFT BREAST LUMPECTOMY WITH RADIOACTIVE SEED AND SENTINEL LYMPH NODE BIOPSY;  Surgeon: Vernetta Berg, MD;  Location: [facility];  Service: General;  Laterality: Left;   KNEE SURGERY     left   KNEE SURGERY Left 07/08/2012   WISDOM TOOTH EXTRACTION     Current Outpatient Medications on File Prior to Visit  Medication Sig Dispense Refill   anastrozole  (ARIMIDEX ) 1 MG tablet Take 1 tablet (1 mg total) by mouth daily. 30 tablet 3   atenolol  (TENORMIN ) 25 MG tablet TAKE 1 TABLET BY MOUTH DAILY 30 tablet 1   atorvastatin  (LIPITOR) 20 MG tablet TAKE 1 TABLET BY MOUTH DAILY 30 tablet 1   calcium -vitamin D (OSCAL WITH D) 250-125 MG-UNIT per tablet Take 1 tablet  by mouth daily.     cephALEXin  (KEFLEX ) 500 MG capsule Take 1 capsule (500 mg total) by mouth 3 (three) times daily. 21 capsule 0   cyclobenzaprine  (FLEXERIL ) 10 MG tablet TAKE ONE TABLET BY MOUTH THREE TIMES A DAY AS NEEDED FOR MUSCLE SPASMS 60 tablet 2   diazepam  (VALIUM ) 5 MG tablet TAKE 1 TABLET BY MOUTH 2 TIMES A DAY AS NEEDED FOR ANXIETY **NEEDS TO MAKE AN APPOINTMENT FOR FUTURE REFILLS** 60 tablet 2   diclofenac  Sodium (VOLTAREN ) 1 % GEL Apply 2 g topically 4 (four) times daily. 100 g 5   DULoxetine  (CYMBALTA ) 60 MG capsule Take 1 capsule (60 mg total) by mouth daily. 90 capsule 1   fish oil-omega-3 fatty acids  1000 MG capsule Take 2 capsules (2 g total) by mouth 2 (two) times daily. 14 capsule 0   fluticasone  (FLONASE ) 50 MCG/ACT nasal spray Place 2 sprays into both nostrils daily. 16 g 6   losartan  (COZAAR ) 100 MG tablet Take 1 tablet (100 mg total) by mouth daily. 90 tablet 1   losartan  (COZAAR ) 100 MG tablet Take 1 tablet (100 mg total) by mouth daily. 30 tablet 11   omeprazole  (PRILOSEC) 20 MG capsule TAKE 1 CAPSULE BY MOUTH DAILY 30 capsule 1  ondansetron  (ZOFRAN -ODT) 4 MG disintegrating tablet Take 1 tablet (4 mg total) by mouth every 8 (eight) hours as needed. 20 tablet 1   polyethylene glycol (MIRALAX ) 17 g packet Take 17 g twice daily until you have a large bowel movement, then begin taking one packet daily as needed for constipation. 30 each 0   senna-docusate (SENOKOT-S) 8.6-50 MG tablet Take 1 tablet by mouth at bedtime as needed for mild constipation. 30 tablet 0   traMADol  (ULTRAM ) 50 MG tablet TAKE 1 TABLET BY MOUTH EVERY 6 HOURS AS NEEDED 120 tablet 0   traMADol  (ULTRAM ) 50 MG tablet TAKE 1 TABLET BY MOUTH EVERY 6 HOURS AS NEEDED 120 tablet 0   zolpidem  (AMBIEN ) 10 MG tablet TAKE 1 TABLET BY MOUTH AT BEDTIME AS NEEDED FOR SLEEP 30 tablet 3   No current facility-administered medications on file prior to visit.   Allergies  Allergen Reactions   Baclofen    Norvasc  [Amlodipine Besylate] Swelling and Rash   Social History   Socioeconomic History   Marital status: Married    Spouse name: Not on file   Number of children: Not on file   Years of education: Not on file   Highest education level: Not on file  Occupational History   Not on file  Tobacco Use   Smoking status: Never   Smokeless tobacco: Never  Vaping Use   Vaping status: Never Used  Substance and Sexual Activity   Alcohol use: Yes    Alcohol/week: 5.0 standard drinks of alcohol    Types: 5 Cans of beer per week   Drug use: No   Sexual activity: Yes    Birth control/protection: Pill  Other Topics Concern   Not on file  Social History Narrative   Not on file   Social Drivers of Health   Tobacco Use: Low Risk (06/24/2024)   Patient History    Smoking Tobacco Use: Never    Smokeless Tobacco Use: Never    Passive Exposure: Not on file  Financial Resource Strain: Not on file  Food Insecurity: Not on file  Transportation Needs: Not on file  Physical Activity: Not on file  Stress: Not on file  Social Connections: Not on file  Intimate Partner Violence: Not on file  Depression (PHQ2-9): Low Risk (01/27/2024)   Depression (PHQ2-9)    PHQ-2 Score: 0  Alcohol Screen: Not on file  Housing: Not on file  Utilities: Not on file  Health Literacy: Not on file     Review of Systems  All other systems reviewed and are negative.      Objective:   Physical Exam Vitals reviewed.  Constitutional:      Appearance: Normal appearance.  Cardiovascular:     Rate and Rhythm: Normal rate and regular rhythm.     Heart sounds: Normal heart sounds.  Pulmonary:     Effort: Pulmonary effort is normal. No respiratory distress.     Breath sounds: Normal breath sounds. No stridor. No wheezing, rhonchi or rales.  Chest:     Chest wall: No tenderness.  Abdominal:     General: Abdomen is flat. Bowel sounds are normal.     Palpations: Abdomen is soft.  Musculoskeletal:     Right knee:  Bony tenderness and crepitus present. Decreased range of motion. No LCL laxity, MCL laxity, ACL laxity or PCL laxity.     Left knee: Bony tenderness and crepitus present. Decreased range of motion. No LCL laxity, MCL laxity, ACL laxity or  PCL laxity. Neurological:     Mental Status: She is alert.            Assessment & Plan:  Primary hypertension - Plan: CBC with Differential/Platelet, Comprehensive metabolic panel with GFR, Lipid panel  Pure hypercholesterolemia - Plan: CBC with Differential/Platelet, Comprehensive metabolic panel with GFR, Lipid panel  Weight loss - Plan: TSH  Bilateral chronic knee pain  I am concerned by the patient's weight loss.  I will check a CBC a CMP and a TSH.  I encouraged the patient to start doing daily exercise to try to build up her muscle strength.  The patient has an elliptical machine at home.  I think that that would be good for her leg muscles.  I also recommended that she Keral low weights such as 2 to 5 pound weights to try to build up the strength in her biceps and triceps and her arms.  I will also check a fasting lipid panel.  I like to see her LDL cholesterol less than 899.  Because of her low blood pressure I recommended reducing losartan  to 50 mg a day.  The patient is requesting cortisone injections in the knees due to her osteoarthritis.  Using sterile technique, I injected the right knee with 2 cc of lidocaine , 2 cc of Marcaine , and 2 cc of 40 mg/mL Kenalog .  Patient tolerated this well without complication.  I then repeated the injection in her left knee with 2 cc of lidocaine , 2 cc of Marcaine , and 2 cc of 40 mg/mL Kenalog .

## 2024-06-24 NOTE — Addendum Note (Signed)
 Addended by: ANGELENA RONAL BRADLEY K on: 06/24/2024 12:08 PM   Modules accepted: Orders

## 2024-06-25 ENCOUNTER — Other Ambulatory Visit: Payer: Self-pay | Admitting: Family Medicine

## 2024-06-25 ENCOUNTER — Ambulatory Visit: Payer: Self-pay | Admitting: Family Medicine

## 2024-06-25 DIAGNOSIS — K219 Gastro-esophageal reflux disease without esophagitis: Secondary | ICD-10-CM

## 2024-06-25 LAB — COMPREHENSIVE METABOLIC PANEL WITH GFR
AG Ratio: 2.2 (calc) (ref 1.0–2.5)
ALT: 14 U/L (ref 6–29)
AST: 19 U/L (ref 10–35)
Albumin: 4.3 g/dL (ref 3.6–5.1)
Alkaline phosphatase (APISO): 73 U/L (ref 37–153)
BUN/Creatinine Ratio: 13 (calc) (ref 6–22)
BUN: 18 mg/dL (ref 7–25)
CO2: 27 mmol/L (ref 20–32)
Calcium: 9.9 mg/dL (ref 8.6–10.4)
Chloride: 105 mmol/L (ref 98–110)
Creat: 1.43 mg/dL — ABNORMAL HIGH (ref 0.50–1.05)
Globulin: 2 g/dL (ref 1.9–3.7)
Glucose, Bld: 97 mg/dL (ref 65–99)
Potassium: 4.4 mmol/L (ref 3.5–5.3)
Sodium: 141 mmol/L (ref 135–146)
Total Bilirubin: 0.3 mg/dL (ref 0.2–1.2)
Total Protein: 6.3 g/dL (ref 6.1–8.1)
eGFR: 41 mL/min/1.73m2 — ABNORMAL LOW

## 2024-06-25 LAB — CBC WITH DIFFERENTIAL/PLATELET
Absolute Lymphocytes: 2580 {cells}/uL (ref 850–3900)
Absolute Monocytes: 596 {cells}/uL (ref 200–950)
Basophils Absolute: 40 {cells}/uL (ref 0–200)
Basophils Relative: 0.6 %
Eosinophils Absolute: 80 {cells}/uL (ref 15–500)
Eosinophils Relative: 1.2 %
HCT: 35 % — ABNORMAL LOW (ref 35.9–46.0)
Hemoglobin: 11.7 g/dL (ref 11.7–15.5)
MCH: 32.4 pg (ref 27.0–33.0)
MCHC: 33.4 g/dL (ref 31.6–35.4)
MCV: 97 fL (ref 81.4–101.7)
MPV: 10 fL (ref 7.5–12.5)
Monocytes Relative: 8.9 %
Neutro Abs: 3404 {cells}/uL (ref 1500–7800)
Neutrophils Relative %: 50.8 %
Platelets: 321 Thousand/uL (ref 140–400)
RBC: 3.61 Million/uL — ABNORMAL LOW (ref 3.80–5.10)
RDW: 11.5 % (ref 11.0–15.0)
Total Lymphocyte: 38.5 %
WBC: 6.7 Thousand/uL (ref 3.8–10.8)

## 2024-06-25 LAB — LIPID PANEL
Cholesterol: 139 mg/dL
HDL: 63 mg/dL
LDL Cholesterol (Calc): 60 mg/dL
Non-HDL Cholesterol (Calc): 76 mg/dL
Total CHOL/HDL Ratio: 2.2 (calc)
Triglycerides: 82 mg/dL

## 2024-06-25 LAB — TSH: TSH: 2.1 m[IU]/L (ref 0.40–4.50)

## 2024-06-29 DIAGNOSIS — Z01419 Encounter for gynecological examination (general) (routine) without abnormal findings: Secondary | ICD-10-CM | POA: Diagnosis not present

## 2024-06-29 DIAGNOSIS — Z124 Encounter for screening for malignant neoplasm of cervix: Secondary | ICD-10-CM | POA: Diagnosis not present

## 2024-06-29 DIAGNOSIS — Z6821 Body mass index (BMI) 21.0-21.9, adult: Secondary | ICD-10-CM | POA: Diagnosis not present

## 2024-06-29 DIAGNOSIS — Z1151 Encounter for screening for human papillomavirus (HPV): Secondary | ICD-10-CM | POA: Diagnosis not present

## 2024-07-04 ENCOUNTER — Other Ambulatory Visit: Payer: Self-pay | Admitting: Family Medicine

## 2024-07-04 DIAGNOSIS — I10 Essential (primary) hypertension: Secondary | ICD-10-CM

## 2024-07-05 ENCOUNTER — Other Ambulatory Visit: Payer: Self-pay | Admitting: *Deleted

## 2024-07-05 ENCOUNTER — Inpatient Hospital Stay

## 2024-07-05 ENCOUNTER — Inpatient Hospital Stay: Attending: Hematology and Oncology | Admitting: Hematology and Oncology

## 2024-07-05 VITALS — BP 122/84 | HR 83 | Temp 97.9°F | Resp 18 | Ht 63.0 in | Wt 114.6 lb

## 2024-07-05 DIAGNOSIS — Z170 Estrogen receptor positive status [ER+]: Secondary | ICD-10-CM

## 2024-07-05 DIAGNOSIS — Z923 Personal history of irradiation: Secondary | ICD-10-CM | POA: Diagnosis not present

## 2024-07-05 DIAGNOSIS — Z1732 Human epidermal growth factor receptor 2 negative status: Secondary | ICD-10-CM | POA: Insufficient documentation

## 2024-07-05 DIAGNOSIS — C50412 Malignant neoplasm of upper-outer quadrant of left female breast: Secondary | ICD-10-CM | POA: Insufficient documentation

## 2024-07-05 DIAGNOSIS — Z1721 Progesterone receptor positive status: Secondary | ICD-10-CM | POA: Diagnosis not present

## 2024-07-05 DIAGNOSIS — Z79811 Long term (current) use of aromatase inhibitors: Secondary | ICD-10-CM | POA: Diagnosis not present

## 2024-07-05 NOTE — Progress Notes (Signed)
 [facility] Cancer Follow up:    Erika Harvey [name], MD [address] [place] [address]   DIAGNOSIS:  Cancer Staging  Malignant neoplasm of upper-outer quadrant of left breast in female, estrogen receptor positive (HCC) Staging form: Breast, AJCC 8th Edition - Clinical stage from 07/19/2021: Stage IA (cT1b, cN0, cM0, G2, ER+, PR+, HER2-) - Signed by Crawford Morna Pickle, NP on 07/25/2021 Stage prefix: Initial diagnosis Histologic grading system: 3 grade system - Pathologic stage from 08/08/2021: Stage IA (pT1b, pN0, cM0, G3, ER+, PR+, HER2-, Oncotype DX score: 28) - Signed by Crawford Morna Pickle, NP on 07/02/2022 Stage prefix: Initial diagnosis Multigene prognostic tests performed: Oncotype DX Recurrence score range: Greater than or equal to 11 Histologic grading system: 3 grade system   SUMMARY OF ONCOLOGIC HISTORY: Oncology History  Malignant neoplasm of upper-outer quadrant of left breast in female, estrogen receptor positive (HCC)  07/19/2021 Initial Diagnosis   Left breast biopsy at 2:30 o'clock: ILC, grade 2, ER 100%, PR 40%, Ki-67 10%, HER2 negative   07/19/2021 Cancer Staging   Staging form: Breast, AJCC 8th Edition - Clinical stage from 07/19/2021: Stage IA (cT1b, cN0, cM0, G2, ER+, PR+, HER2-) - Signed by Crawford Morna Pickle, NP on 07/25/2021 Stage prefix: Initial diagnosis Histologic grading system: 3 grade system   08/08/2021 Surgery   Left breast lumpectomy: ILC 0.7cm, g 3, margins negative, 4 SLN negative   08/08/2021 Oncotype testing   Oncotype score results are as 28, distant recurrence risk at 9 years with tamoxifen  alone is 17% and chemotherapy benefit was deemed greater than 15%. Risks and benefits of adjuvant chemotherapy reviewed and patient opted to forego treatment.    08/08/2021 Cancer Staging   Staging form: Breast, AJCC 8th Edition - Pathologic stage from 08/08/2021: Stage IA (pT1b, pN0, cM0, G3, ER+, PR+, HER2-, Oncotype DX  score: 28) - Signed by Crawford Morna Pickle, NP on 07/02/2022 Stage prefix: Initial diagnosis Multigene prognostic tests performed: Oncotype DX Recurrence score range: Greater than or equal to 11 Histologic grading system: 3 grade system   09/13/2021 - 10/11/2021 Radiation Therapy   Site Technique Total Dose (Gy) Dose per Fx (Gy) Completed Fx Beam Energies  Breast, Left: Breast_L 3D 42.56/42.56 2.66 16/16 6XFFF  Breast, Left: Breast_L_Bst 3D 8/8 2 4/4 6X     11/2021 -  Anti-estrogen oral therapy   Anastrozole  daily     CURRENT THERAPY: observation  INTERVAL HISTORY  Erika Harvey is a 63 year old female with breast cancer who is here for follow up.    Patient Active Problem List   Diagnosis Date Noted   Malignant neoplasm of upper-outer quadrant of left breast in female, estrogen receptor positive (HCC) 07/25/2021   Hypoglycemia 09/15/2018   Insomnia 09/07/2015   Osteopenia 08/09/2015   Vaginal bleeding 03/30/2015   Fibroid 03/30/2015   GERD (gastroesophageal reflux disease) 11/10/2014   Generalized OA 03/29/2014   Lumbar herniated disc 02/09/2014    Class: Chronic   Chronic pain syndrome 02/09/2014   DDD (degenerative disc disease), lumbar 02/09/2014   Spondylolysis 02/09/2014   Lumbosacral radiculitis 02/09/2014   Chronic low back pain    Hypertension    Anxiety    Frequent UTI     is allergic to amlodipine besylate and baclofen.  MEDICAL HISTORY: Past Medical History:  Diagnosis Date   Allergy    Anxiety    Arthritis    Breast cancer (HCC) 07/19/2021   Chronic low back pain  Fibroid 03/30/2015   Frequent UTI    Hypertension    Personal history of radiation therapy    Vaginal bleeding 03/30/2015    SURGICAL HISTORY: Past Surgical History:  Procedure Laterality Date   BACK SURGERY  05/16/2015   BREAST BIOPSY Left 07/19/2021   BREAST LUMPECTOMY Left 08/08/2021   BREAST LUMPECTOMY WITH RADIOACTIVE SEED AND SENTINEL LYMPH NODE BIOPSY Left  08/08/2021   Procedure: LEFT BREAST LUMPECTOMY WITH RADIOACTIVE SEED AND SENTINEL LYMPH NODE BIOPSY;  Surgeon: Vernetta Berg, MD;  Location: [facility];  Service: General;  Laterality: Left;   KNEE SURGERY     left   KNEE SURGERY Left 07/08/2012   WISDOM TOOTH EXTRACTION      SOCIAL HISTORY: Social History   Socioeconomic History   Marital status: Married    Spouse name: Not on file   Number of children: Not on file   Years of education: Not on file   Highest education level: Not on file  Occupational History   Not on file  Tobacco Use   Smoking status: Never   Smokeless tobacco: Never  Vaping Use   Vaping status: Never Used  Substance and Sexual Activity   Alcohol use: Yes    Alcohol/week: 5.0 standard drinks of alcohol    Types: 5 Cans of beer per week   Drug use: No   Sexual activity: Yes    Birth control/protection: Pill  Other Topics Concern   Not on file  Social History Narrative   Not on file   Social Drivers of Health   Tobacco Use: Low Risk (06/24/2024)   Patient History    Smoking Tobacco Use: Never    Smokeless Tobacco Use: Never    Passive Exposure: Not on file  Financial Resource Strain: Not on file  Food Insecurity: Not on file  Transportation Needs: Not on file  Physical Activity: Not on file  Stress: Not on file  Social Connections: Not on file  Intimate Partner Violence: Not on file  Depression (PHQ2-9): Low Risk (06/24/2024)   Depression (PHQ2-9)    PHQ-2 Score: 0  Alcohol Screen: Not on file  Housing: Not on file  Utilities: Not on file  Health Literacy: Not on file    FAMILY HISTORY: Family History  Problem Relation Age of Onset   Breast cancer Mother    Heart disease Father 59   Diabetes Father    Colon cancer Neg Hx    Colon polyps Neg Hx      PHYSICAL EXAMINATION  ECOG PERFORMANCE STATUS: 1 - Symptomatic but completely ambulatory  Vitals:   07/05/24 1040  BP: 122/84  Pulse: 83  Resp: 18  Temp:  97.9 F (36.6 C)  SpO2: 100%    She appears well, alert, oriented and in no acute distress.  LABORATORY DATA:  None for this visit   ASSESSMENT and THERAPY PLAN:   No problem-specific Assessment & Plan notes found for this encounter.   All questions were answered. The patient knows to call the clinic with any problems, questions or concerns. We can certainly see the patient much sooner if necessary.  Total encounter time:30 minutes*in face-to-face visit time, chart review, lab review, care coordination, order entry, and documentation of the encounter time.   *Total Encounter Time as defined by the Centers for Medicare and Medicaid Services includes, in addition to the face-to-face time of a patient visit (documented in the note above) non-face-to-face time: obtaining and reviewing outside history, ordering and reviewing medications,  tests or procedures, care coordination (communications with other health care professionals or caregivers) and documentation in the medical record.

## 2024-07-05 NOTE — Progress Notes (Signed)
 [facility] Cancer Follow up:    Loretha Ash, MD [address] [place] [address]   DIAGNOSIS:  Cancer Staging  Malignant neoplasm of upper-outer quadrant of left breast in female, estrogen receptor positive (HCC) Staging form: Breast, AJCC 8th Edition - Clinical stage from 07/19/2021: Stage IA (cT1b, cN0, cM0, G2, ER+, PR+, HER2-) - Signed by Crawford Morna Pickle, NP on 07/25/2021 Stage prefix: Initial diagnosis Histologic grading system: 3 grade system - Pathologic stage from 08/08/2021: Stage IA (pT1b, pN0, cM0, G3, ER+, PR+, HER2-, Oncotype DX score: 28) - Signed by Crawford Morna Pickle, NP on 07/02/2022 Stage prefix: Initial diagnosis Multigene prognostic tests performed: Oncotype DX Recurrence score range: Greater than or equal to 11 Histologic grading system: 3 grade system   SUMMARY OF ONCOLOGIC HISTORY: Oncology History  Malignant neoplasm of upper-outer quadrant of left breast in female, estrogen receptor positive (HCC)  07/19/2021 Initial Diagnosis   Left breast biopsy at 2:30 o'clock: ILC, grade 2, ER 100%, PR 40%, Ki-67 10%, HER2 negative   07/19/2021 Cancer Staging   Staging form: Breast, AJCC 8th Edition - Clinical stage from 07/19/2021: Stage IA (cT1b, cN0, cM0, G2, ER+, PR+, HER2-) - Signed by Crawford Morna Pickle, NP on 07/25/2021 Stage prefix: Initial diagnosis Histologic grading system: 3 grade system   08/08/2021 Surgery   Left breast lumpectomy: ILC 0.7cm, g 3, margins negative, 4 SLN negative   08/08/2021 Oncotype testing   Oncotype score results are as 28, distant recurrence risk at 9 years with tamoxifen  alone is 17% and chemotherapy benefit was deemed greater than 15%. Risks and benefits of adjuvant chemotherapy reviewed and patient opted to forego treatment.    08/08/2021 Cancer Staging   Staging form: Breast, AJCC 8th Edition - Pathologic stage from 08/08/2021: Stage IA (pT1b, pN0, cM0, G3, ER+, PR+, HER2-, Oncotype DX score: 28) -  Signed by Crawford Morna Pickle, NP on 07/02/2022 Stage prefix: Initial diagnosis Multigene prognostic tests performed: Oncotype DX Recurrence score range: Greater than or equal to 11 Histologic grading system: 3 grade system   09/13/2021 - 10/11/2021 Radiation Therapy   Site Technique Total Dose (Gy) Dose per Fx (Gy) Completed Fx Beam Energies  Breast, Left: Breast_L 3D 42.56/42.56 2.66 16/16 6XFFF  Breast, Left: Breast_L_Bst 3D 8/8 2 4/4 6X     11/2021 -  Anti-estrogen oral therapy   Anastrozole  daily     CURRENT THERAPY: Tamoxifen   INTERVAL HISTORY  Discussed the use of AI scribe software for clinical note transcription with the patient, who gave verbal consent to proceed.  History of Present Illness Erika Harvey is a 63 year old female with estrogen receptor positive left breast cancer who presents for routine oncology follow-up and surveillance. She had severe stomach upset, nausea and vomiting with anastrozole  and elected to stop it and proceed with surveillance only.  She is not currently receiving endocrine therapy due to prior intolerance to anastrozole . She has a mammogram scheduled for July 28, 2024, and a Guardant Reveal blood test for breast cancer recurrence performed on December 26, 2023, was negative. She denies new breast lumps, nipple changes, axillary symptoms, abnormal bleeding, or bruising. She feels well overall and denies any new symptoms.  Her antihypertensive regimen was recently adjusted by her primary care provider on December 18, with a reduction in losartan  dose due to low blood pressure. She is currently taking half her previous dose and denies symptoms of hypotension.  She had a recent episode of influenza despite vaccination but has since  recovered and currently feels well. She previously experienced gastrointestinal symptoms that resolved prior to her gastroenterology evaluation and currently denies nausea, vomiting, diarrhea, changes in bowel habits,  hematochezia, melena, or hematuria. She remains active and reports feeling the best she has in some time.    Patient Active Problem List   Diagnosis Date Noted   Malignant neoplasm of upper-outer quadrant of left breast in female, estrogen receptor positive (HCC) 07/25/2021   Hypoglycemia 09/15/2018   Insomnia 09/07/2015   Osteopenia 08/09/2015   Vaginal bleeding 03/30/2015   Fibroid 03/30/2015   GERD (gastroesophageal reflux disease) 11/10/2014   Generalized OA 03/29/2014   Lumbar herniated disc 02/09/2014    Class: Chronic   Chronic pain syndrome 02/09/2014   DDD (degenerative disc disease), lumbar 02/09/2014   Spondylolysis 02/09/2014   Lumbosacral radiculitis 02/09/2014   Chronic low back pain    Hypertension    Anxiety    Frequent UTI     is allergic to amlodipine besylate and baclofen.  MEDICAL HISTORY: Past Medical History:  Diagnosis Date   Allergy    Anxiety    Arthritis    Breast cancer (HCC) 07/19/2021   Chronic low back pain    Fibroid 03/30/2015   Frequent UTI    Hypertension    Personal history of radiation therapy    Vaginal bleeding 03/30/2015    SURGICAL HISTORY: Past Surgical History:  Procedure Laterality Date   BACK SURGERY  05/16/2015   BREAST BIOPSY Left 07/19/2021   BREAST LUMPECTOMY Left 08/08/2021   BREAST LUMPECTOMY WITH RADIOACTIVE SEED AND SENTINEL LYMPH NODE BIOPSY Left 08/08/2021   Procedure: LEFT BREAST LUMPECTOMY WITH RADIOACTIVE SEED AND SENTINEL LYMPH NODE BIOPSY;  Surgeon: Vernetta Berg, MD;  Location: [facility];  Service: General;  Laterality: Left;   KNEE SURGERY     left   KNEE SURGERY Left 07/08/2012   WISDOM TOOTH EXTRACTION      SOCIAL HISTORY: Social History   Socioeconomic History   Marital status: Married    Spouse name: Not on file   Number of children: Not on file   Years of education: Not on file   Highest education level: Not on file  Occupational History   Not on file  Tobacco Use    Smoking status: Never   Smokeless tobacco: Never  Vaping Use   Vaping status: Never Used  Substance and Sexual Activity   Alcohol use: Yes    Alcohol/week: 5.0 standard drinks of alcohol    Types: 5 Cans of beer per week   Drug use: No   Sexual activity: Yes    Birth control/protection: Pill  Other Topics Concern   Not on file  Social History Narrative   Not on file   Social Drivers of Health   Tobacco Use: Low Risk (06/24/2024)   Patient History    Smoking Tobacco Use: Never    Smokeless Tobacco Use: Never    Passive Exposure: Not on file  Financial Resource Strain: Not on file  Food Insecurity: Not on file  Transportation Needs: Not on file  Physical Activity: Not on file  Stress: Not on file  Social Connections: Not on file  Intimate Partner Violence: Not on file  Depression (PHQ2-9): Low Risk (06/24/2024)   Depression (PHQ2-9)    PHQ-2 Score: 0  Alcohol Screen: Not on file  Housing: Not on file  Utilities: Not on file  Health Literacy: Not on file    FAMILY HISTORY: Family History  Problem Relation  Age of Onset   Breast cancer Mother    Heart disease Father 50   Diabetes Father    Colon cancer Neg Hx    Colon polyps Neg Hx      PHYSICAL EXAMINATION  ECOG PERFORMANCE STATUS: 1 - Symptomatic but completely ambulatory  Vitals:   07/05/24 1040  BP: 122/84  Pulse: 83  Resp: 18  Temp: 97.9 F (36.6 C)  SpO2: 100%    She appears well, alert, oriented and in no acute distress.  LABORATORY DATA:  None for this visit   ASSESSMENT and THERAPY PLAN:   Malignant neoplasm of upper-outer quadrant of left breast in female, estrogen receptor positive (HCC) Robie is a 63 year old woman with history of stage Ia ER/PR positive breast cancer that was diagnosed in January 2023 status post lumpectomy, adjuvant radiation, and antiestrogen therapy with anastrozole  daily which began in May 2023, she took it for almost 2 yrs then suddenly started complaining of  weight loss, loss of appetite and came to see us  saying she didn't want to keep taking it. We then switched to tamoxifen  which once again she didn't tolerate it, so after much discussion we decided to proceed with surveillance alone.  Assessment and Plan Assessment & Plan Estrogen receptor positive left breast cancer, upper-outer quadrant Not on anastrozole  due to intolerance. No new breast symptoms. Guardant Reveal test in June 2025 showed no recurrence. Breast and axillary exam normal. - Removed anastrozole  from medication list. - Ordered Guardant Reveal blood test for surveillance. - Confirmed mammogram on July 28, 2024. - Scheduled follow-up in six months.  Essential hypertension Losartan  dose reduced by primary care due to hypotension. Currently asymptomatic.     All questions were answered. The patient knows to call the clinic with any problems, questions or concerns. We can certainly see the patient much sooner if necessary.  Total encounter time:30 minutes*in face-to-face visit time, chart review, lab review, care coordination, order entry, and documentation of the encounter time.   *Total Encounter Time as defined by the Centers for Medicare and Medicaid Services includes, in addition to the face-to-face time of a patient visit (documented in the note above) non-face-to-face time: obtaining and reviewing outside history, ordering and reviewing medications, tests or procedures, care coordination (communications with other health care professionals or caregivers) and documentation in the medical record.

## 2024-07-05 NOTE — Assessment & Plan Note (Signed)
 Erika Harvey is a 63 year old woman with history of stage Ia ER/PR positive breast cancer that was diagnosed in January 2023 status post lumpectomy, adjuvant radiation, and antiestrogen therapy with anastrozole  daily which began in May 2023, she took it for almost 2 yrs then suddenly started complaining of weight loss, loss of appetite and came to see us  saying she didn't want to keep taking it. We then switched to tamoxifen  which once again she didn't tolerate it, so after much discussion we decided to proceed with surveillance alone.  Assessment and Plan Assessment & Plan Estrogen receptor positive left breast cancer, upper-outer quadrant Not on anastrozole  due to intolerance. No new breast symptoms. Guardant Reveal test in June 2025 showed no recurrence. Breast and axillary exam normal. - Removed anastrozole  from medication list. - Ordered Guardant Reveal blood test for surveillance. - Confirmed mammogram on July 28, 2024. - Scheduled follow-up in six months.  Essential hypertension Losartan  dose reduced by primary care due to hypotension. Currently asymptomatic.

## 2024-07-11 ENCOUNTER — Other Ambulatory Visit: Payer: Self-pay | Admitting: Family Medicine

## 2024-07-11 DIAGNOSIS — F419 Anxiety disorder, unspecified: Secondary | ICD-10-CM

## 2024-07-14 ENCOUNTER — Encounter: Payer: Self-pay | Admitting: Hematology and Oncology

## 2024-07-15 LAB — GUARDANT REVEAL

## 2024-07-16 ENCOUNTER — Ambulatory Visit: Payer: Self-pay | Admitting: Hematology and Oncology

## 2024-07-28 ENCOUNTER — Ambulatory Visit: Admission: RE | Admit: 2024-07-28 | Source: Ambulatory Visit

## 2024-07-28 ENCOUNTER — Other Ambulatory Visit: Payer: Self-pay | Admitting: Family Medicine

## 2024-07-29 ENCOUNTER — Telehealth: Payer: Self-pay | Admitting: Pharmacy Technician

## 2024-07-29 ENCOUNTER — Other Ambulatory Visit (HOSPITAL_COMMUNITY): Payer: Self-pay

## 2024-07-29 NOTE — Telephone Encounter (Signed)
 Pharmacy Patient Advocate Encounter   Received notification from [facility] that prior authorization for traMADol  HCl 50MG  tablets is required/requested.   Insurance verification completed.   The patient is insured through [employer].   Per test claim: PA required; PA started via CoverMyMeds. KEY [id number] . Waiting for clinical questions to populate.

## 2024-08-04 ENCOUNTER — Other Ambulatory Visit (HOSPITAL_COMMUNITY): Payer: Self-pay

## 2024-08-04 NOTE — Telephone Encounter (Signed)
 Pharmacy Patient Advocate Encounter  Received notification from [facility] that Prior Authorization for traMADol  HCl 50MG  tablets  has been CANCELLED due to:     PA #/Case ID/Reference #: [id number]

## 2024-08-04 NOTE — Telephone Encounter (Signed)
 Update, as of 08/04/2024 PA is still pending

## 2024-08-09 ENCOUNTER — Other Ambulatory Visit (HOSPITAL_COMMUNITY): Payer: Self-pay

## 2024-08-10 NOTE — Telephone Encounter (Signed)
 fyi

## 2025-01-04 ENCOUNTER — Inpatient Hospital Stay: Admitting: Hematology and Oncology
